# Patient Record
Sex: Male | Born: 1940 | Race: White | Hispanic: No | Marital: Married | Smoking: Former smoker
Health system: Southern US, Community
[De-identification: ages and names within clinical notes are randomized; demographics above are authoritative.]

## PROBLEM LIST (undated history)

## (undated) DIAGNOSIS — Z789 Other specified health status: Secondary | ICD-10-CM

## (undated) DIAGNOSIS — E119 Type 2 diabetes mellitus without complications: Secondary | ICD-10-CM

## (undated) DIAGNOSIS — C61 Malignant neoplasm of prostate: Secondary | ICD-10-CM

## (undated) DIAGNOSIS — I639 Cerebral infarction, unspecified: Secondary | ICD-10-CM

## (undated) DIAGNOSIS — I10 Essential (primary) hypertension: Secondary | ICD-10-CM

## (undated) HISTORY — PX: PROSTATE BIOPSY: SHX241

## (undated) HISTORY — PX: VASECTOMY: SHX75

## (undated) HISTORY — DX: Cerebral infarction, unspecified: I63.9

## (undated) HISTORY — DX: Malignant neoplasm of prostate: C61

---
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
## 1978-03-12 DIAGNOSIS — Z789 Other specified health status: Secondary | ICD-10-CM

## 1978-03-12 HISTORY — PX: OTHER SURGICAL HISTORY: SHX169

## 1978-03-12 HISTORY — DX: Other specified health status: Z78.9

## 2005-04-25 ENCOUNTER — Encounter: Payer: Self-pay | Admitting: Family Medicine

## 2006-02-09 ENCOUNTER — Encounter: Payer: Self-pay | Admitting: Family Medicine

## 2006-02-09 LAB — CONVERTED CEMR LAB: PSA: 2.5 ng/mL

## 2006-06-04 ENCOUNTER — Ambulatory Visit: Payer: Self-pay | Admitting: Family Medicine

## 2006-08-23 ENCOUNTER — Ambulatory Visit: Payer: Self-pay | Admitting: Family Medicine

## 2006-08-23 DIAGNOSIS — C61 Malignant neoplasm of prostate: Secondary | ICD-10-CM | POA: Insufficient documentation

## 2006-08-23 DIAGNOSIS — R7989 Other specified abnormal findings of blood chemistry: Secondary | ICD-10-CM | POA: Insufficient documentation

## 2006-08-27 LAB — CONVERTED CEMR LAB: PSA: 2.62 ng/mL (ref 0.10–4.00)

## 2006-10-01 ENCOUNTER — Encounter: Payer: Self-pay | Admitting: Family Medicine

## 2006-10-01 DIAGNOSIS — I456 Pre-excitation syndrome: Secondary | ICD-10-CM | POA: Insufficient documentation

## 2006-10-01 DIAGNOSIS — Z8679 Personal history of other diseases of the circulatory system: Secondary | ICD-10-CM | POA: Insufficient documentation

## 2006-10-22 ENCOUNTER — Ambulatory Visit: Payer: Self-pay | Admitting: Family Medicine

## 2006-10-22 DIAGNOSIS — E1149 Type 2 diabetes mellitus with other diabetic neurological complication: Secondary | ICD-10-CM | POA: Insufficient documentation

## 2007-01-02 ENCOUNTER — Encounter: Payer: Self-pay | Admitting: Family Medicine

## 2007-01-10 ENCOUNTER — Ambulatory Visit: Payer: Self-pay | Admitting: Family Medicine

## 2007-03-13 LAB — HM COLONOSCOPY

## 2007-03-19 ENCOUNTER — Encounter: Payer: Self-pay | Admitting: Family Medicine

## 2007-03-20 ENCOUNTER — Encounter: Payer: Self-pay | Admitting: Family Medicine

## 2007-03-31 ENCOUNTER — Telehealth (INDEPENDENT_AMBULATORY_CARE_PROVIDER_SITE_OTHER): Payer: Self-pay | Admitting: *Deleted

## 2007-05-23 ENCOUNTER — Ambulatory Visit: Payer: Self-pay | Admitting: Gastroenterology

## 2007-06-03 ENCOUNTER — Ambulatory Visit: Payer: Self-pay | Admitting: Gastroenterology

## 2007-06-03 ENCOUNTER — Encounter: Payer: Self-pay | Admitting: Family Medicine

## 2007-06-23 ENCOUNTER — Ambulatory Visit: Payer: Self-pay | Admitting: Family Medicine

## 2007-06-23 DIAGNOSIS — E7800 Pure hypercholesterolemia, unspecified: Secondary | ICD-10-CM

## 2007-06-23 DIAGNOSIS — E1169 Type 2 diabetes mellitus with other specified complication: Secondary | ICD-10-CM | POA: Insufficient documentation

## 2007-06-23 LAB — CONVERTED CEMR LAB
Cholesterol: 172 mg/dL (ref 0–200)
HDL: 40.6 mg/dL (ref >39.0)
Hgb A1c MFr Bld: 5.9 % (ref 4.6–6.0)
VLDL: 12 mg/dL (ref 0–40)

## 2007-06-25 ENCOUNTER — Ambulatory Visit: Payer: Self-pay | Admitting: Family Medicine

## 2007-06-27 LAB — CONVERTED CEMR LAB
Microalb Creat Ratio: 8.2 mg/g (ref 0.0–30.0)
Microalb, Ur: 1.2 mg/dL (ref 0.0–1.9)

## 2007-12-03 ENCOUNTER — Ambulatory Visit: Payer: Self-pay | Admitting: Family Medicine

## 2007-12-09 ENCOUNTER — Ambulatory Visit: Payer: Self-pay | Admitting: Family Medicine

## 2007-12-26 ENCOUNTER — Ambulatory Visit: Payer: Self-pay | Admitting: Family Medicine

## 2008-01-01 ENCOUNTER — Ambulatory Visit: Payer: Self-pay | Admitting: Family Medicine

## 2008-01-01 DIAGNOSIS — M67919 Unspecified disorder of synovium and tendon, unspecified shoulder: Secondary | ICD-10-CM | POA: Insufficient documentation

## 2008-01-01 DIAGNOSIS — L821 Other seborrheic keratosis: Secondary | ICD-10-CM | POA: Insufficient documentation

## 2008-01-01 DIAGNOSIS — M719 Bursopathy, unspecified: Secondary | ICD-10-CM

## 2008-01-01 LAB — CONVERTED CEMR LAB
ALT: 25 units/L (ref 0–53)
Albumin: 3.7 g/dL (ref 3.5–5.2)
Alkaline Phosphatase: 41 units/L (ref 39–117)
BUN: 15 mg/dL (ref 6–23)
Bilirubin, Direct: 0.2 mg/dL (ref 0.0–0.3)
CO2: 31 meq/L (ref 19–32)
Calcium: 9.3 mg/dL (ref 8.4–10.5)
Cholesterol: 173 mg/dL (ref 0–200)
GFR calc Af Amer: 124 mL/min
Glucose, Bld: 126 mg/dL — ABNORMAL HIGH (ref 70–99)
HDL: 38 mg/dL — ABNORMAL LOW (ref >39.0)
Potassium: 4.6 meq/L (ref 3.5–5.1)
Sodium: 142 meq/L (ref 135–145)
Total Protein: 6.7 g/dL (ref 6.0–8.3)
VLDL: 13 mg/dL (ref 0–40)

## 2008-01-09 ENCOUNTER — Telehealth: Payer: Self-pay | Admitting: Family Medicine

## 2008-01-23 ENCOUNTER — Telehealth: Payer: Self-pay | Admitting: Family Medicine

## 2008-02-03 ENCOUNTER — Encounter: Payer: Self-pay | Admitting: Family Medicine

## 2008-02-03 ENCOUNTER — Ambulatory Visit: Admission: RE | Admit: 2008-02-03 | Discharge: 2008-03-09 | Payer: Self-pay | Admitting: Radiation Oncology

## 2008-03-10 ENCOUNTER — Telehealth: Payer: Self-pay | Admitting: Family Medicine

## 2008-03-29 ENCOUNTER — Ambulatory Visit: Payer: Self-pay | Admitting: Family Medicine

## 2008-03-30 LAB — CONVERTED CEMR LAB
AST: 19 units/L (ref 0–37)
Alkaline Phosphatase: 42 units/L (ref 39–117)
Bilirubin, Direct: 0.1 mg/dL (ref 0.0–0.3)
LDL Cholesterol: 75 mg/dL (ref 0–99)
Total CHOL/HDL Ratio: 2.8
Triglycerides: 46 mg/dL (ref 0–149)

## 2008-05-31 ENCOUNTER — Telehealth: Payer: Self-pay | Admitting: Family Medicine

## 2008-07-06 ENCOUNTER — Ambulatory Visit: Payer: Self-pay | Admitting: Family Medicine

## 2008-07-06 LAB — CONVERTED CEMR LAB
Creatinine,U: 147 mg/dL
HDL goal, serum: 40 mg/dL
Hgb A1c MFr Bld: 6.1 % (ref 4.6–6.5)
LDL Goal: 100 mg/dL
Microalb Creat Ratio: 16.3 mg/g (ref 0.0–30.0)
Microalb, Ur: 2.4 mg/dL — ABNORMAL HIGH (ref 0.0–1.9)

## 2008-07-20 DIAGNOSIS — R198 Other specified symptoms and signs involving the digestive system and abdomen: Secondary | ICD-10-CM

## 2008-07-21 ENCOUNTER — Telehealth: Payer: Self-pay | Admitting: Family Medicine

## 2008-07-21 ENCOUNTER — Encounter: Admission: RE | Admit: 2008-07-21 | Discharge: 2008-07-21 | Payer: Self-pay | Admitting: Family Medicine

## 2008-07-22 ENCOUNTER — Telehealth: Payer: Self-pay | Admitting: Family Medicine

## 2008-08-05 ENCOUNTER — Ambulatory Visit: Payer: Self-pay | Admitting: Family Medicine

## 2008-08-05 DIAGNOSIS — M545 Low back pain: Secondary | ICD-10-CM | POA: Insufficient documentation

## 2008-12-14 ENCOUNTER — Ambulatory Visit: Payer: Self-pay | Admitting: Family Medicine

## 2008-12-30 ENCOUNTER — Ambulatory Visit: Payer: Self-pay | Admitting: Family Medicine

## 2008-12-31 ENCOUNTER — Telehealth: Payer: Self-pay | Admitting: Family Medicine

## 2008-12-31 LAB — CONVERTED CEMR LAB
ALT: 22 units/L (ref 0–53)
BUN: 12 mg/dL (ref 6–23)
Bilirubin, Direct: 0.1 mg/dL (ref 0.0–0.3)
Calcium: 9.1 mg/dL (ref 8.4–10.5)
Cholesterol: 154 mg/dL (ref 0–200)
Creatinine, Ser: 0.8 mg/dL (ref 0.4–1.5)
GFR calc non Af Amer: 102.21 mL/min (ref >60)
Hgb A1c MFr Bld: 6.1 % (ref 4.6–6.5)
Total Bilirubin: 0.8 mg/dL (ref 0.3–1.2)
Triglycerides: 47 mg/dL (ref 0.0–149.0)
VLDL: 9.4 mg/dL (ref 0.0–40.0)

## 2009-01-06 ENCOUNTER — Ambulatory Visit: Payer: Self-pay | Admitting: Family Medicine

## 2009-01-06 DIAGNOSIS — L219 Seborrheic dermatitis, unspecified: Secondary | ICD-10-CM

## 2009-01-06 LAB — CONVERTED CEMR LAB: LDL Goal: 70 mg/dL

## 2009-07-04 ENCOUNTER — Ambulatory Visit: Payer: Self-pay | Admitting: Family Medicine

## 2009-07-05 LAB — CONVERTED CEMR LAB
AST: 21 units/L (ref 0–37)
Alkaline Phosphatase: 44 units/L (ref 39–117)
Bilirubin, Direct: 0.1 mg/dL (ref 0.0–0.3)
CO2: 30 meq/L (ref 19–32)
Calcium: 9.2 mg/dL (ref 8.4–10.5)
GFR calc non Af Amer: 89.08 mL/min (ref >60)
HDL: 36.6 mg/dL — ABNORMAL LOW (ref >39.00)
Potassium: 4.3 meq/L (ref 3.5–5.1)
Sodium: 143 meq/L (ref 135–145)
Total CHOL/HDL Ratio: 4
Total Protein: 6.3 g/dL (ref 6.0–8.3)
VLDL: 11.2 mg/dL (ref 0.0–40.0)

## 2009-07-08 ENCOUNTER — Telehealth: Payer: Self-pay | Admitting: Family Medicine

## 2009-07-08 ENCOUNTER — Ambulatory Visit: Payer: Self-pay | Admitting: Family Medicine

## 2009-09-05 ENCOUNTER — Encounter: Payer: Self-pay | Admitting: Family Medicine

## 2009-09-09 ENCOUNTER — Ambulatory Visit: Payer: Self-pay | Admitting: Urology

## 2009-09-14 ENCOUNTER — Ambulatory Visit: Payer: Self-pay | Admitting: Family Medicine

## 2009-09-14 DIAGNOSIS — R809 Proteinuria, unspecified: Secondary | ICD-10-CM

## 2009-09-14 LAB — CONVERTED CEMR LAB
Cholesterol: 151 mg/dL (ref 0–200)
Creatinine,U: 154.7 mg/dL
Microalb, Ur: 2.9 mg/dL — ABNORMAL HIGH (ref 0.0–1.9)
Triglycerides: 61 mg/dL (ref 0.0–149.0)

## 2009-09-19 ENCOUNTER — Encounter: Payer: Self-pay | Admitting: Family Medicine

## 2009-10-06 ENCOUNTER — Encounter: Payer: Self-pay | Admitting: Family Medicine

## 2009-10-14 ENCOUNTER — Encounter: Payer: Self-pay | Admitting: Family Medicine

## 2009-10-18 ENCOUNTER — Ambulatory Visit: Payer: Self-pay | Admitting: Urology

## 2009-10-19 ENCOUNTER — Ambulatory Visit: Payer: Self-pay | Admitting: Urology

## 2009-10-19 ENCOUNTER — Encounter: Payer: Self-pay | Admitting: Family Medicine

## 2009-11-15 ENCOUNTER — Encounter: Payer: Self-pay | Admitting: Family Medicine

## 2009-12-05 ENCOUNTER — Ambulatory Visit: Payer: Self-pay | Admitting: Family Medicine

## 2009-12-06 LAB — CONVERTED CEMR LAB
ALT: 21 units/L (ref 0–53)
BUN: 18 mg/dL (ref 6–23)
CO2: 31 meq/L (ref 19–32)
Chloride: 105 meq/L (ref 96–112)
Cholesterol: 151 mg/dL (ref 0–200)
Creatinine, Ser: 0.8 mg/dL (ref 0.4–1.5)
Glucose, Bld: 109 mg/dL — ABNORMAL HIGH (ref 70–99)
Hgb A1c MFr Bld: 6.2 % (ref 4.6–6.5)
Total Protein: 6.6 g/dL (ref 6.0–8.3)
Triglycerides: 53 mg/dL (ref 0.0–149.0)

## 2009-12-09 ENCOUNTER — Ambulatory Visit: Payer: Self-pay | Admitting: Family Medicine

## 2009-12-09 DIAGNOSIS — M533 Sacrococcygeal disorders, not elsewhere classified: Secondary | ICD-10-CM | POA: Insufficient documentation

## 2009-12-09 DIAGNOSIS — R030 Elevated blood-pressure reading, without diagnosis of hypertension: Secondary | ICD-10-CM

## 2010-01-17 ENCOUNTER — Encounter: Payer: Self-pay | Admitting: Family Medicine

## 2010-02-07 ENCOUNTER — Encounter: Payer: Self-pay | Admitting: Family Medicine

## 2010-04-11 NOTE — Progress Notes (Signed)
  Phone Note Other Incoming   Caller: patient Summary of Call: Pt inoffice..[facility] to [facility]  to discuss appropriateness of NEW VISALOOK MRI PROCEDURE for prostate cancer. ? call radiology ot [facility] Initial call taken by: Kerby Nora MD,  July 08, 2009 12:17 PM  Follow-up for Phone Call        Pt has to be referred to a Urologiist first and they will refer .  Says he  wants a second opinion and wants to be referred to Dr. Achilles Dunk in [place].  I have scheduled an appt, need referral..sign Follow-up by: Daine Gip,  July 08, 2009 1:05 PM

## 2010-04-11 NOTE — Letter (Signed)
Summary: [facility]  [facility]   Imported By: Lanelle Bal 02/11/2010 08:36:30  _____________________________________________________________________  External Attachment:    Type:   Image     Comment:   External Document

## 2010-04-11 NOTE — Letter (Signed)
Summary: [facility]  [facility]   Imported By: Maryln Gottron 10/25/2009 12:18:31  _____________________________________________________________________  External Attachment:    Type:   Image     Comment:   External Document

## 2010-04-11 NOTE — Consult Note (Signed)
Summary: [facility]   Imported By: Lanelle Bal 09/13/2009 11:47:11  _____________________________________________________________________  External Attachment:    Type:   Image     Comment:   External Document

## 2010-04-11 NOTE — Letter (Signed)
Summary: [facility]  [facility]   Imported By: Sherian Rein 11/30/2009 10:59:18  _____________________________________________________________________  External Attachment:    Type:   Image     Comment:   External Document

## 2010-04-11 NOTE — Letter (Signed)
Summary: [facility]  [facility]   Imported By: Maryln Gottron 10/21/2009 14:12:42  _____________________________________________________________________  External Attachment:    Type:   Image     Comment:   External Document

## 2010-04-11 NOTE — Letter (Signed)
Summary: [facility]  [facility]   Imported By: Lester Butte des Morts 09/22/2009 12:03:26  _____________________________________________________________________  External Attachment:    Type:   Image     Comment:   External Document

## 2010-04-11 NOTE — Assessment & Plan Note (Signed)
Summary: CPX  CYD   Vital Signs:  Patient profile:   70 year old male Height:      71.5 inches Weight:      194.0 pounds BMI:     26.78 Temp:     97.6 degrees F oral Pulse rate:   72 / minute Pulse rhythm:   regular BP sitting:   130 / 60  (left arm) Cuff size:   regular  Vitals Entered By: Benny Lennert CMA Duncan Dull) (July 08, 2009 11:28 AM)  History of Present Illness: Chief complaint  Here for chronic medical issue management.  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 Currently looking for second opinion...needs a referral for consult for new "vivalook" MRI procedure for prostate cancer...at [facility]...Marland Kitchenhe wants to talk with someone about whether this is appropriate.   Seb Derm: significant improvement  with ketoconazole cream.   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 Once Daily 2)  Pravastatin Sodium 40 Mg Tabs (Pravastatin Sodium) .Marland Kitchen.. 1  Tab By Mouth Daily 3)  Vitamin D 1000 Unit  Tabs (Cholecalciferol) .... Two Times A Day 4)  Calcium 500 Mg Tabs (Calcium Carbonate) .... Once Daily 5)  Lycopine 10mg  .... Daily 6)  Ketoconazole 2 % Sham (Ketoconazole) .... Shampoo Hair Every 2 Weeks 7)  Ketoconazole 2 % Crea  (Ketoconazole) .... As Needed 8)  Finasteride 5 Mg Tabs (Finasteride) .... Daily 9)  Fish Oil Maximum Strength 1200 Mg Caps (Omega-3 Fatty Acids) .... Take 2 Tablets Daily 10)  Pomegranate Extract 250 Mg Caps (Pomegranate (Punica Granatum)) .... Daily 11)  Zetia 10 Mg Tabs (Ezetimibe) .Marland Kitchen.. 1 Tab By Mouth Daily  Allergies (verified): No Known Drug Allergies  Past History:  Past medical, surgical, family and social histories (including risk factors) reviewed, and no changes noted (except as noted below).  Past Medical History: Reviewed history from 10/01/2006 and no changes required. Osteoarthritis Cerebrovascular accident, hx of  Past Surgical History: Reviewed history from 10/01/2006 and no changes required. 1949     Tonsillectomy 1980      Partial ACL tear  Family History: Reviewed history from 10/01/2006 and no changes required. Father: Alive 81, BPH, DM, pacemaker due to bradycardia Mother: Died 38, cerebral aneurysm Siblings: 2 brothers - ?SVT, tachycardia No MI < 26  Social History: Reviewed history from 10/01/2006 and no changes required. Former Games developer, college Alcohol use-yes, occasionally beer/wine  Drug use-no Regular exercise-yes, [employer] 3-4 x/week, golf Marital Status: Married x 42 years Children: 2, healthy Occupation: Retired, ___________writer Diet:  Fruit, veggies, low carbohydrates -  rice/pasta,   Review of Systems General:  Denies fatigue and fever. CV:  Denies chest pain or discomfort. Resp:  Denies shortness of breath. GI:  Denies abdominal pain, bloody stools, constipation, and diarrhea. GU:  Denies dysuria.  Physical Exam  General:  Well-developed,well-nourished,in no acute distress; alert,appropriate  and cooperative throughout examination Eyes:  No corneal or conjunctival inflammation noted. EOMI. Perrla. Funduscopic exam benign, without hemorrhages, exudates or papilledema. Vision grossly normal. Ears:  External ear exam shows no significant  lesions or deformities.  Otoscopic examination reveals clear canals, tympanic membranes are intact bilaterally without bulging, retraction, inflammation or discharge. Hearing is grossly normal bilaterally. Nose:  External nasal examination shows no deformity or inflammation. Nasal mucosa are pink and moist without lesions or exudates. Mouth:  MMM Neck:  no carotid bruit or thyromegaly no cervical or supraclavicular lymphadenopathy  Lungs:  Normal respiratory effort, chest expands symmetrically. Lungs are clear to auscultation, no crackles or wheezes. Heart:  Normal rate and regular rhythm. S1 and S2 normal without gallop, murmur, click, rub or other extra sounds. Abdomen:  Bowel sounds positive,abdomen soft and non-tender without masses, organomegaly or hernias noted. Genitalia:  per uro Prostate:  per uro Pulses:  R and L posterior tibial pulses are full and equal bilaterally  Extremities:  no edema   Diabetes Management Exam:    Foot Exam (with socks and/or shoes not present):       Sensory-Pinprick/Light touch:          Left medial foot (L-4): normal          Left dorsal foot (L-5): normal          Left lateral foot (S-1): normal          Right medial foot (L-4): normal          Right dorsal foot (L-5): normal          Right lateral foot (S-1): normal       Sensory-Monofilament:          Left foot: normal          Right foot: normal       Inspection:          Left foot: normal          Right foot: normal       Nails:          Left foot: normal          Right foot: normal   Impression & Recommendations:  Problem # 1:  HYPERCHOLESTEROLEMIA (ICD-272.0) Inaedquate control given hiostory of CVA..LDL goal<70. Pt is not open toincreasing pravastatin given past SE..will insteasd add zetia. Recheck fasting LIPIDS, AST, ALT  in 3 months Dx 272.0    His updated medication list for this problem includes:    Pravastatin Sodium 40 Mg Tabs (Pravastatin sodium) .Marland Kitchen... 1  tab by mouth daily     Zetia 10 Mg Tabs (Ezetimibe) .Marland Kitchen... 1 tab by mouth daily  Labs Reviewed: SGOT: 21 (07/04/2009)   SGPT: 26 (07/04/2009)  Lipid Goals: Chol Goal: 200 (01/06/2009)   HDL Goal: 40 (01/06/2009)   LDL Goal: 70 (01/06/2009)   TG Goal: 150 (01/06/2009)  Prior 10 Yr Risk Heart Disease: 9 % (01/06/2009)   HDL:36.60 (07/04/2009), 45.60 (12/30/2008)  LDL:93 (07/04/2009), 99 (12/30/2008)  Chol:141 (07/04/2009), 154 (12/30/2008)  Trig:56.0 (07/04/2009), 47.0 (12/30/2008)  Problem # 2:  DM (ICD-250.00) Well controlled with lifestyle control.  His updated medication list for this problem includes:    Adult Aspirin Low Strength 81 Mg Tbdp (Aspirin) ..... Once daily  Labs Reviewed: Creat: 0.9 (07/04/2009)     Last Eye Exam: normal (05/10/2008) Reviewed HgBA1c results: 6.0 (07/04/2009)  6.1 (12/30/2008)  Problem # 3:  ADENOCARCINOMA, PROSTATE (ICD-185) Refer to [facility] for vivalook consultation.   Problem #  4:  DERMATITIS, SEBORRHEIC (ICD-690.10) IMproveing with ketoconazole cream.   Complete Medication List: 1)  Adult Aspirin Low Strength 81 Mg Tbdp (Aspirin) .... Once daily 2)  Pravastatin Sodium 40 Mg Tabs (Pravastatin sodium) .Marland Kitchen.. 1  tab by mouth daily 3)  Vitamin D 1000 Unit Tabs (Cholecalciferol) .... Two times a day 4)  Calcium 500 Mg Tabs (Calcium carbonate) .... Once daily 5)  Lycopine 10mg   .... Daily 6)  Ketoconazole 2 % Sham (Ketoconazole) .... Shampoo hair every 2 weeks 7)  Ketoconazole 2 % Crea (Ketoconazole) .... As needed 8)  Finasteride 5 Mg Tabs (Finasteride) .... Daily 9)  Fish Oil Maximum Strength 1200 Mg Caps (Omega-3 fatty acids) .... Take 2 tablets daily 10)  Pomegranate Extract 250 Mg Caps (Pomegranate (punica granatum)) .... Daily 11)  Zetia 10 Mg Tabs (Ezetimibe) .Marland Kitchen.. 1 tab by mouth daily  Patient Instructions: 1)  Start zetia along with pravastain.  2)  Recheck fasting LIPIDS, AST, ALT microalbumin in 3 months Dx 272.0, 250.00 3)  Please schedule a follow-up  appointment in 6 months  DM 4)  Prior to appt Fasting lipids, CMET, A1C.  5)  STOP TO SPEAK WITH CYNTHIA ON YOUR WAY OUT. Prescriptions: ZETIA 10 MG TABS (EZETIMIBE) 1 tab by mouth daily  #30 x 11   Entered and Authorized by:   Kerby Nora MD   Signed by:   Kerby Nora MD on 07/08/2009   Method used:   Electronically to        [facility] [address] (retail)       [address], [address]       [place], [place]  [address]       Ph: [phone]       Fax: [phone]   RxID:   [id number] PRAVASTATIN SODIUM 40 MG TABS (PRAVASTATIN SODIUM) 1  tab by mouth daily  #90 x 3   Entered and Authorized by:   Kerby Nora MD   Signed by:   Kerby Nora MD on 07/08/2009   Method used:   Electronically to        [facility] [address] (retail)       [address], [address]       [place], [place]  [address]       Ph: [phone]       Fax: [phone]   RxID:   [id number]   Current Allergies (reviewed today): No known allergies

## 2010-04-11 NOTE — Assessment & Plan Note (Signed)
Summary: ROA FOR 6 MONTH FOLLOW-UP/JRR   Vital Signs:  Patient profile:   70 year old male Height:      71.5 inches Weight:      193.0 pounds BMI:     26.64 Temp:     98.7 degrees F oral Pulse rate:   72 / minute Pulse rhythm:   regular BP sitting:   120 / 84  (left arm) Cuff size:   regular  Vitals Entered By: Benny Lennert CMA Duncan Dull) (December 09, 2009 12:22 PM)  History of Present Illness: Chief complaint 6 month follow up   DM, well controlled with diet  Elevated blood pressures...Marland Kitchenon finasteride...recent checks ar other MDs have been 145-165/70  Denies anxiety at MD. BPs have always been normal here.  High cholesterol not at  goal <70 given past CVA  Recent discovery of higher grade Gleason score Prostate cancer..plans on treatment in [facility] with proton radiation and hormone treatment 6 months of lupron. Starts proton therapy 10/6 to 02/2010  Coccyx sore x several weeks...pain if sitting directly on it. Prostate MD did not think it was due to met to bone.. Nml bone scan. No known injury. HAs been driving a lot.  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 Pain only with squeezing toe. No heat, warmth, mild redness.      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 Once Daily 2)  Pravastatin Sodium 40 Mg Tabs (Pravastatin Sodium) .Marland Kitchen.. 1  Tab By Mouth Daily 3)  Vitamin D 1000 Unit  Tabs (Cholecalciferol) .... Two Times A Day 4)  Calcium 500 Mg Tabs (Calcium Carbonate) .... Once Daily 5)  Ketoconazole 2 % Sham (Ketoconazole) .... Shampoo Hair Every 2 Weeks 6)  Ketoconazole 2 % Crea (Ketoconazole) .... As Needed 7)  Finasteride 5 Mg Tabs (Finasteride) .... Daily 8)  Fish Oil Maximum Strength 1200 Mg Caps (Omega-3 Fatty Acids) .... Take 2 Tablets Daily 9)  Pomegranate Extract 250 Mg Caps (Pomegranate (Punica Granatum)) .... Daily  Allergies (verified): No Known Drug Allergies  Past History:  Past medical, surgical, family and social histories (including risk factors) reviewed, and no changes noted (except as noted below).  Past Medical History: Reviewed history from 10/01/2006 and no changes required. Osteoarthritis Cerebrovascular accident, hx of  Past Surgical History: Reviewed history from 10/01/2006 and no changes required. 1949     Tonsillectomy 1980      Partial ACL tear  Family History: Reviewed history from 10/01/2006 and no changes required. Father: Alive 44, BPH, DM, pacemaker due to bradycardia Mother: Died 57, cerebral aneurysm Siblings: 2 brothers - ?SVT, tachycardia No MI < 54  Social History: Reviewed history from 10/01/2006 and no changes required. Former Games developer, college Alcohol use-yes, occasionally beer/wine  Drug use-no Regular exercise-yes, [employer] 3-4 x/week, golf Marital Status: Married x 42 years Children: 2,  healthy Occupation: Retired, ___________writer Diet:  Fruit, veggies, low carbohydrates -  rice/pasta,   Review of Systems General:  Denies fatigue and fever. CV:  Denies chest pain or discomfort. Resp:  Denies shortness of breath. GI:  Denies abdominal pain. GU:  Denies dysuria.  Physical Exam  General:  Well-developed,well-nourished,in no acute distress; alert,appropriate and  cooperative throughout examination Eyes:  No corneal or conjunctival inflammation noted. EOMI. Perrla. Funduscopic exam benign, without hemorrhages, exudates or papilledema. Vision grossly normal. Ears:  External ear exam shows no significant lesions or deformities.  Otoscopic examination reveals clear canals, tympanic membranes are intact bilaterally without bulging, retraction, inflammation or discharge. Hearing is grossly normal bilaterally. Nose:  External nasal examination shows no deformity or inflammation. Nasal mucosa are pink and moist without lesions or exudates. Mouth:  MMM Neck:  no carotid bruit or thyromegaly no cervical or supraclavicular lymphadenopathy  Lungs:  Normal respiratory effort, chest expands symmetrically. Lungs are clear to auscultation, no crackles or wheezes. Heart:  Normal rate and regular rhythm. S1 and S2 normal without gallop, murmur, click, rub or other extra sounds. Abdomen:  Bowel sounds positive,abdomen soft and non-tender without masses, organomegaly or hernias noted. Prostate:  refused...also refused exam of coccyx Pulses:  R and L posterior tibial pulses are full and equal bilaterally  Extremities:  no edema   Diabetes Management Exam:    Foot Exam (with socks and/or shoes not present):       Sensory-Pinprick/Light touch:          Left medial foot (L-4): normal          Left dorsal foot (L-5): normal          Left lateral foot (S-1): normal          Right medial foot (L-4): normal          Right dorsal foot (L-5): normal          Right lateral foot (S-1): normal       Sensory-Monofilament:          Left foot: normal          Right foot: normal       Inspection:          Left foot: abnormal             Comments: left great toenail edge slightly ingrown ...mild erythema no warmth, no fluctulance, no discharge Recommended warm water soaks and direction of nail upwards woith small coton swab and ball          Right foot: normal       Nails:           Left foot: normal          Right foot: normal   Impression & Recommendations:  Problem # 1:  DM (ICD-250.00)  Well controlledwith diet. Encouraged exercise, weight loss, healthy eating habits.  His updated medication list for this problem includes:    Adult Aspirin Low Strength 81 Mg Tbdp (Aspirin) ..... Once daily  Labs Reviewed: Creat: 0.8 (12/05/2009)     Last Eye Exam: normal (05/10/2008) Reviewed HgBA1c results: 6.2 (12/05/2009)  6.0 (07/04/2009)  Problem # 2:  ELEVATED BP READING WITHOUT DX HYPERTENSION (ICD-796.2) Follow BPs at home, record.  Drop off measurements in 2 weeks. Low salt diet, continue regular exercsie.  BP today: 120/84 Prior BP: 130/60 (07/08/2009)  Prior 10 Yr Risk Heart Disease: 9 % (01/06/2009)  Labs Reviewed: Creat: 0.8 (12/05/2009) Chol: 151 (12/05/2009)  HDL: 45.20 (12/05/2009)   LDL: 95 (12/05/2009)   TG: 53.0 (12/05/2009)  Instructed in low sodium diet (DASH Handout) and behavior modification.    Problem # 3:  HYPERCHOLESTEROLEMIA (ICD-272.0) On zetia and pravastatin..Patients goal less than 70 given past ? CVA.Marland KitchenJpatient does not currently wish to be more agressive in treating this given concern of SE to medicaitons. He will work on lifestyle changes...recheck in 6 months.  The following medications were removed from the medication list:    Zetia 10 Mg Tabs (Ezetimibe) .Marland Kitchen... 1 tab by mouth daily His updated medication list for this problem includes:    Pravastatin Sodium 40 Mg Tabs (Pravastatin sodium) .Marland Kitchen... 1  tab by mouth daily  Labs Reviewed: SGOT: 18 (12/05/2009)   SGPT: 21 (12/05/2009)  Lipid Goals: Chol Goal: 200 (01/06/2009)   HDL Goal: 40 (01/06/2009)   LDL Goal: 70 (01/06/2009)   TG Goal: 150 (01/06/2009)  Prior 10 Yr Risk Heart Disease: 9 % (01/06/2009)   HDL:45.20 (12/05/2009), 44.60 (09/14/2009)  LDL:95 (12/05/2009), 94 (09/14/2009)  Chol:151 (12/05/2009), 151 (09/14/2009)  Trig:53.0 (12/05/2009), 61.0 (09/14/2009)  Problem  # 4:  ADENOCARCINOMA, PROSTATE (ICD-185) Per [facility] in [facility].  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  No known falls...recommended NSAIDs as needed and donut hole cushion on his long car rides to [facility] that he has been doing lately.  Complete Medication List: 1)  Adult Aspirin Low Strength 81 Mg Tbdp (Aspirin) .... Once daily 2)  Pravastatin Sodium 40 Mg Tabs (Pravastatin sodium) .Marland Kitchen.. 1  tab by mouth daily 3)  Vitamin D 1000 Unit Tabs (Cholecalciferol) .... Two times a day 4)  Calcium 500 Mg Tabs (Calcium carbonate) .... Once daily 5)  Ketoconazole 2 % Sham (Ketoconazole) .... Shampoo hair every 2 weeks 6)  Ketoconazole 2 % Crea (Ketoconazole) .... As needed 7)  Finasteride 5 Mg Tabs (Finasteride) .... Daily 8)  Fish Oil Maximum Strength 1200 Mg Caps (Omega-3 fatty acids) .... Take 2 tablets daily 9)  Pomegranate Extract 250 Mg Caps (Pomegranate (punica granatum)) .... Daily  Patient Instructions: 1)  Follow BPs at home, record. 2)   Drop off measurements in 2 weeks. 3)  Low salt diet, continue regular exercsie.  4)  Please schedule a follow-up appointment in 6 months  CPX. 5)  BMP prior to visit, ICD-9: 250.00 6)  Hepatic Panel prior to visit ICD-9:  7)  Lipid panel prior to visit ICD-9 :  8)  HgBA1c prior to visit  ICD-9:  9)  Urine Microalbumin prior to visit ICD-9 :  10)  Consider welchol to treat cholesterol.Call if interested.   Current Allergies (reviewed today): No known allergies   Last Flu Vaccine:  Fluvax 3+ (12/14/2008 9:42:39 AM) Flu Vaccine Result Date:  12/09/2009 Flu Vaccine Result:  at pharm  Flu Vaccine Next Due:  1 yr

## 2010-04-19 ENCOUNTER — Encounter: Payer: Self-pay | Admitting: Family Medicine

## 2010-04-19 LAB — HM SIGMOIDOSCOPY

## 2010-04-24 ENCOUNTER — Encounter: Payer: Self-pay | Admitting: Family Medicine

## 2010-04-24 ENCOUNTER — Other Ambulatory Visit: Payer: Self-pay | Admitting: Family Medicine

## 2010-04-24 ENCOUNTER — Ambulatory Visit (INDEPENDENT_AMBULATORY_CARE_PROVIDER_SITE_OTHER)
Admission: RE | Admit: 2010-04-24 | Discharge: 2010-04-24 | Disposition: A | Discharge status: Home / Self Care | Payer: Private Health Insurance - Indemnity | Source: Ambulatory Visit | Attending: Family Medicine | Admitting: Family Medicine

## 2010-04-24 ENCOUNTER — Ambulatory Visit (INDEPENDENT_AMBULATORY_CARE_PROVIDER_SITE_OTHER): Payer: Medicare Other | Admitting: Family Medicine

## 2010-04-24 DIAGNOSIS — M533 Sacrococcygeal disorders, not elsewhere classified: Secondary | ICD-10-CM

## 2010-04-26 ENCOUNTER — Telehealth: Payer: Self-pay | Admitting: Family Medicine

## 2010-05-01 ENCOUNTER — Encounter: Payer: Self-pay | Admitting: Family Medicine

## 2010-05-01 ENCOUNTER — Ambulatory Visit: Payer: Self-pay | Admitting: Family Medicine

## 2010-05-03 NOTE — Progress Notes (Signed)
Summary: pt agrees to MRI  Phone Note Call from Patient Call back at Home Phone [phone] P PH     Caller: Patient Call For: Alexander Nora MD Summary of Call: Pt agrees to MRI.  He wants to go to [facility] for this.               Lowella Petties CMA, AAMA  April 26, 2010 9:32 AM

## 2010-05-03 NOTE — Letter (Signed)
Summary: [facility]   Imported By: Kassie Mends 04/28/2010 09:58:50  _____________________________________________________________________  External Attachment:    Type:   Image     Comment:   External Document

## 2010-05-03 NOTE — Assessment & Plan Note (Signed)
Summary: TAILBONE PAIN/CLE    MEDICARE/AETNA   Vital Signs:  Patient profile:   70 year old male Weight:      192.75 pounds Temp:     97.8 degrees F oral Pulse rate:   68 / minute Pulse rhythm:   regular BP sitting:   132 / 70  (left arm) Cuff size:   large  Vitals Entered By: Sydell Axon LPN (April 24, 2010 10:57 AM) CC: Tailbone pain for 6-8 months, worse when sitting in a hard chair Pain Assessment Patient in pain? yes     Location: tailbone Intensity: 8 Type: dull   History of Present Illness: At [facility] last year in 10/2009... Pt noted:  Coccyx sore x several weeks...pain if sitting directly on it. Prostate MD did not think it was due to met to bone.. Nml bone scan. No known injury. Has been driving a lot.  At that OV...recommended donut cushoin on long rides, NSAIDs heat.  Pt refused exam at that time.   Today pt returns with continued pain, ongoing now x 6-8 months... at tip of tailbone. Pain is only present when sitting on hard surface when rols back on tailbone. No pain with walking. No constipation, no fever, no radiation of pain.     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  (ICD-796.2) 3)  Microalbuminuria  (ICD-791.0) 4)  Health Maintenance Exam  (ICD-V70.0) 5)  Dermatitis, Seborrheic  (ICD-690.10) 6)  Low Back Pain, Acute  (ICD-724.2) 7)  Other Symptoms Involving Abdomen and Pelvis  (ICD-789.9) 8)  Bursitis, Right Shoulder  (ICD-726.10) 9)  Seborrheic Keratosis  (ICD-702.19) 10)  Hypercholesterolemia  (ICD-272.0) 11)  Dm  (ICD-250.00) 12)  Wolff (WOLFE)-parkinson-white (WPW) Syndrome  (ICD-426.7) 13)  Cerebrovascular Accident, Hx of  (ICD-V12.50) 14)  Osteoarthritis, 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 Once  Daily 2)  Pravastatin Sodium 40 Mg Tabs (Pravastatin Sodium) .Marland Kitchen.. 1  Tab By Mouth Daily 3)  Vitamin D 1000 Unit  Tabs (Cholecalciferol) .... Two Times A Day 4)  Calcium 500 Mg Tabs (Calcium Carbonate) .... Once Daily 5)  Ketoconazole 2 % Sham (Ketoconazole) .... Shampoo Hair Every 2 Weeks 6)  Ketoconazole 2 % Crea (Ketoconazole) .... As Needed 7)  Fish Oil Maximum Strength 1200 Mg Caps (Omega-3 Fatty Acids) .... Take 2 Tablets Daily 8)  Pomegranate Extract 250 Mg Caps (Pomegranate (Punica Granatum)) .... Daily 9)  Cinnamon 500 Mg Caps (Cinnamon) .... Takes 1000 Mg Daily  Allergies (verified): No Known Drug Allergies  Past History:  Past medical, surgical, family and social histories (including risk factors) reviewed, and no changes noted (except as noted below).  Past Medical History: Reviewed history from 10/01/2006 and no changes required. Osteoarthritis Cerebrovascular accident, hx of  Past Surgical History: Reviewed history from 10/01/2006 and no changes required. 1949     Tonsillectomy 1980      Partial ACL tear  Family History: Reviewed history from 10/01/2006 and no changes required. Father: Alive 66, BPH, DM, pacemaker due to bradycardia Mother: Died 64, cerebral aneurysm Siblings: 2 brothers - ?SVT, tachycardia No MI < 33  Social History: Reviewed history from 10/01/2006 and no changes required. Former Smoker, college Alcohol use-yes, occasionally beer/wine  Drug use-no Regular exercise-yes, [employer] 3-4 x/week, golf Marital Status: Married x 42  years Children: 2, healthy Occupation: Retired, ___________writer Diet:  Fruit, veggies, low carbohydrates -  rice/pasta,   Review of Systems General:  Denies fatigue and fever. CV:  Denies chest pain or discomfort. Resp:  Denies shortness of breath.  Physical Exam  General:  Well-developed,well-nourished,in no acute distress; alert,appropriate and cooperative throughout examination Mouth:  MMM Neck:  no carotid  bruit or thyromegaly no cervical or supraclavicular lymphadenopathy  Lungs:  Normal respiratory effort, chest expands symmetrically. Lungs are clear to auscultation, no crackles or wheezes. Heart:  Normal rate and regular rhythm. S1 and S2 normal without gallop, murmur, click, rub or other extra sounds. Msk:  no spine ttp... except with verticle pressure directly on cocyxx Full ROM of Spine   Impression & Recommendations:  Problem # 1:  OTHER DISORDER OF COCCYX (ICD-724.79) No know injury... but 5-8 months wortht of pain.  Will start eval with plain film. He did have bone scan done last summer given his prostate cancer that was nml.  He has since had prostate cancer treatment.. with chemo, radiation etc... but this was all after pain already started.   Orders: T-Coccyx/Sacrum 2 Views (72220TC)  Complete Medication List: 1)  Adult Aspirin Low Strength 81 Mg Tbdp (Aspirin) .... Once daily 2)  Pravastatin Sodium 40 Mg Tabs (Pravastatin sodium) .Marland Kitchen.. 1  tab by mouth daily 3)  Vitamin D 1000 Unit Tabs (Cholecalciferol) .... Two times a day 4)  Calcium 500 Mg Tabs (Calcium carbonate) .... Once daily 5)  Ketoconazole 2 % Sham (Ketoconazole) .... Shampoo hair every 2 weeks 6)  Ketoconazole 2 % Crea (Ketoconazole) .... As needed 7)  Fish Oil Maximum Strength 1200 Mg Caps (Omega-3 fatty acids) .... Take 2 tablets daily 8)  Pomegranate Extract 250 Mg Caps (Pomegranate (punica granatum)) .... Daily 9)  Cinnamon 500 Mg Caps (Cinnamon) .... Takes 1000 mg daily  Patient Instructions: 1)  We will call  you with X-ray resutls and with furhter recommendations.    Orders Added: 1)  Est. Patient Level III [04540] 2)  T-Coccyx/Sacrum 2 Views [72220TC]    Current Allergies (reviewed today): No known allergies

## 2010-05-04 ENCOUNTER — Encounter: Payer: Self-pay | Admitting: Family Medicine

## 2010-05-18 NOTE — Letter (Signed)
Summary: [facility]   Imported By: Kassie Mends 05/10/2010 09:17:50  _____________________________________________________________________  External Attachment:    Type:   Image     Comment:   External Document

## 2010-06-02 ENCOUNTER — Other Ambulatory Visit: Payer: Self-pay

## 2010-06-09 ENCOUNTER — Encounter: Payer: Self-pay | Admitting: Family Medicine

## 2010-06-23 ENCOUNTER — Other Ambulatory Visit: Payer: Self-pay | Admitting: Family Medicine

## 2010-06-23 DIAGNOSIS — E119 Type 2 diabetes mellitus without complications: Secondary | ICD-10-CM

## 2010-06-27 ENCOUNTER — Other Ambulatory Visit (INDEPENDENT_AMBULATORY_CARE_PROVIDER_SITE_OTHER): Payer: Medicare Other | Admitting: Family Medicine

## 2010-06-27 DIAGNOSIS — E119 Type 2 diabetes mellitus without complications: Secondary | ICD-10-CM

## 2010-06-27 LAB — HEPATIC FUNCTION PANEL
ALT: 23 U/L (ref 0–53)
Total Bilirubin: 0.7 mg/dL (ref 0.3–1.2)
Total Protein: 6.8 g/dL (ref 6.0–8.3)

## 2010-06-27 LAB — BASIC METABOLIC PANEL
BUN: 19 mg/dL (ref 6–23)
Calcium: 9.8 mg/dL (ref 8.4–10.5)
Chloride: 105 mEq/L (ref 96–112)
Creatinine, Ser: 0.7 mg/dL (ref 0.4–1.5)

## 2010-06-27 LAB — LIPID PANEL
Cholesterol: 161 mg/dL (ref 0–200)
LDL Cholesterol: 94 mg/dL (ref 0–99)
VLDL: 12.6 mg/dL (ref 0.0–40.0)

## 2010-06-27 LAB — MICROALBUMIN / CREATININE URINE RATIO
Creatinine,U: 118.7 mg/dL
Microalb, Ur: 4.7 mg/dL — ABNORMAL HIGH (ref 0.0–1.9)

## 2010-07-04 ENCOUNTER — Encounter: Payer: Self-pay | Admitting: Family Medicine

## 2010-07-14 ENCOUNTER — Encounter: Payer: Self-pay | Admitting: Family Medicine

## 2010-07-14 ENCOUNTER — Ambulatory Visit (INDEPENDENT_AMBULATORY_CARE_PROVIDER_SITE_OTHER): Payer: Medicare Other | Admitting: Family Medicine

## 2010-07-14 VITALS — BP 130/72 | HR 80 | Temp 98.4°F | Ht 72.0 in | Wt 193.0 lb

## 2010-07-14 DIAGNOSIS — C61 Malignant neoplasm of prostate: Secondary | ICD-10-CM

## 2010-07-14 DIAGNOSIS — E119 Type 2 diabetes mellitus without complications: Secondary | ICD-10-CM

## 2010-07-14 DIAGNOSIS — R030 Elevated blood-pressure reading, without diagnosis of hypertension: Secondary | ICD-10-CM

## 2010-07-14 DIAGNOSIS — E7800 Pure hypercholesterolemia, unspecified: Secondary | ICD-10-CM

## 2010-07-14 DIAGNOSIS — Z0000 Encounter for general adult medical examination without abnormal findings: Secondary | ICD-10-CM

## 2010-07-14 DIAGNOSIS — M533 Sacrococcygeal disorders, not elsewhere classified: Secondary | ICD-10-CM

## 2010-07-14 DIAGNOSIS — R809 Proteinuria, unspecified: Secondary | ICD-10-CM

## 2010-07-14 NOTE — Progress Notes (Signed)
Subjective:    Patient ID: Alexander Yang, male    DOB: 10/10/1940, 70 y.o.   MRN: [id number]  HPI I have personally reviewed the Medicare Annual Wellness questionnaire and have noted 1. The patient's medical and social history 2. Their use of alcohol, tobacco or illicit drugs 3. Their current medications and supplements 4. The patient's functional ability including ADL's, fall risks, home safety risks and hearing or visual             impairment. 5. Diet and physical activities 6. Evidence for depression or mood disorders The patients weight, height, BMI and visual acuity have been recorded in the chart I have made referrals, counseling and provided education to the patient based review of the above and I have provided the pt with a written personalized care plan for preventive services.  Diabetes:  Diet controlled.. A1C at goal but trending up over last 6 months. Hypoglycemic episodes: None Hyperglycemic episodes:None Feet problems:none Blood Sugars averaging:Not checking at home. eye exam within last year: 06/06/2010  Elevated Cholesterol:  At goal LDL <100 on pravastatin Using medications without problems: Muscle aches: None  Other complaints:None  Coccygeal pain .. aboutthe same. No specific changes on MRI. Followed now by Dr. Margaretha Sheffield.   Prostate Cancer Followed at [facility]. Getting Lupron injections. Tolerating well.. Some decrease in energy and sexual function. PSA 0.5 in 05/11/2010, prostate exam in last year.   PMH and SH reviewed.   Vital signs, Meds and allergies reviewed.   Diabetic foot exam: Normal inspection No skin breakdown No calluses  Normal DP pulses Normal sensation to light tough and monofilament Nails normal     Review of Systems  Constitutional: Negative for fever, fatigue and unexpected weight change.  HENT: Negative for ear pain, congestion, sore throat, rhinorrhea, trouble swallowing and postnasal drip.   Eyes: Negative for pain.    Respiratory: Negative for cough, shortness of breath and wheezing.   Cardiovascular: Negative for chest pain, palpitations and leg swelling.  Gastrointestinal: Negative for nausea, abdominal pain, diarrhea, constipation and blood in stool.  Skin: Negative for rash.  Neurological: Negative for syncope, weakness, light-headedness, numbness and headaches.  Psychiatric/Behavioral: Negative for behavioral problems and dysphoric mood. The patient is not nervous/anxious.          Objective:   Physical Exam  Constitutional: He appears well-developed and well-nourished.  Non-toxic appearance. He does not appear ill. No distress.  HENT:  Head: Normocephalic and atraumatic.  Right Ear: Hearing, tympanic membrane, external ear and ear canal normal.  Left Ear: Hearing, tympanic membrane, external ear and ear canal normal.  Nose: Nose normal.  Mouth/Throat: Uvula is midline, oropharynx is clear and moist and mucous membranes are normal.  Eyes: Conjunctivae, EOM and lids are normal. Pupils are equal, round, and reactive to light. No foreign bodies found.  Neck: Trachea normal, normal range of motion and phonation normal. Neck supple. Carotid bruit is not present. No mass and no thyromegaly present.  Cardiovascular: Normal rate, regular rhythm, S1 normal, S2 normal, intact distal pulses and normal pulses.  Exam reveals no gallop.   No murmur heard. Pulmonary/Chest: Breath sounds normal. He has no wheezes. He has no rhonchi. He has no rales.  Abdominal: Soft. Normal appearance and bowel sounds are normal. There is no hepatosplenomegaly. There is no tenderness. There is no rebound, no guarding and no CVA tenderness. No hernia.  Lymphadenopathy:    He has no cervical adenopathy.  Neurological: He is alert. He has normal strength and normal  reflexes. No cranial nerve deficit or sensory deficit. Gait normal.  Skin: Skin is warm, dry and intact. No rash noted.  Psychiatric: He has a normal mood and affect.  His speech is normal and behavior is normal. Judgment normal.          Assessment & Plan:  Annua; medicare Wellness: The patient's preventative maintenance and recommended screening tests for an annual wellness exam were reviewed in full today. Brought up to date unless services declined.  Counselled on the importance of diet, exercise, and its role in overall health and mortality. The patient's FH and SH was reviewed, including their home life, tobacco status, and drug and alcohol status.

## 2010-07-14 NOTE — Patient Instructions (Signed)
Talk with [place] about microalbuminuria.. I would typically recommend ACE inhibitor in a diabetic for this. Do they think that radiation to prostate and bladder could effect this or cause this? Let me know. We will recheck DM control after Lupron completed. Work on regular exercise and healthy low carb low fat diet.

## 2010-07-14 NOTE — Assessment & Plan Note (Signed)
Well controlled. Continue current medication.  

## 2010-07-14 NOTE — Assessment & Plan Note (Signed)
Well controlled. Would tolerate ACEi as indicated.

## 2010-07-14 NOTE — Assessment & Plan Note (Signed)
Worsened control likely due to lupron. Encouraged exercise, weight loss, healthy eating habits.  Recheck in 6 months when off lupron.

## 2010-07-14 NOTE — Assessment & Plan Note (Signed)
Follow ed by Dr. Margaretha Sheffield. Plan repeat MRI to follow up.

## 2010-07-14 NOTE — Assessment & Plan Note (Signed)
Recommend ACEI for kidney protection. He will discuss with his prostate specialist to see if any of his recent treatments could cause this.

## 2010-07-28 NOTE — Assessment & Plan Note (Signed)
[facility]                           [facility] OFFICE NOTE   [name], Alexander Yang                      MRN:          [id number]  DATE:06/04/2006                            DOB:          03/31/1940    NEW PATIENT HISTORY AND PHYSICAL:   CHIEF COMPLAINT:  A 70 year old white male here to establish new doctor.   HISTORY OF PRESENT ILLNESS:  Alexander Yang moved from [place] to  [place] about 2 years ago.  He previously saw Dr. Nicholes Yang as a  primary care doctor.  His last complete physical was in December 2007.  He has seen him once since then.  He has the following concerns:   1. Recent diagnosis prediabetes:  Blood sugars were checked in      December 2007 and it was elevated at around 120.  Blood sugar was      then again checked by Dr. Nicholes Yang this past month and was      significantly improved, around 110 fasting.  He denies increased      thirst, fatigue, weakness, chest pain, shortness of breath.  He      reports that Dr. Nicholes Yang had plans to recheck the blood sugar in      June 2008.  2. Elevated PSA:  Alexander Yang reports that his PSA last year was      around 1.4 and then this year in December was 2.5.  Given the large      jump over one year, Dr. Nicholes Yang had wanted to recheck on this in      June 2007.  He denies any urinary incontinence, urgency, dysuria,      or difficulty with flow.   REVIEW OF SYSTEMS:  No headache, no dizziness, no syncope.  He is  nearsighted and wears glasses.  No visual change, no hearing change.  No  shortness of breath, no chest pain or palpitations.  No nausea,  vomiting, diarrhea, constipation or rectal bleeding.   PAST MEDICAL HISTORY:  1. Prediabetes.  2. Arthritis in bilateral knees.  3. 1982, history of CVA.  4. Possible WPW syndrome, seen once but then not verified again on      other EKGs.   HOSPITALIZATIONS, SURGERIES AND PROCEDURES:  1. 1949, tonsillectomy.  2. 1980, partial ACL tear.  3.  Flexible sigmoidoscopy, unsure time.   ALLERGIES:  No known drug allergies.   MEDICATIONS:  1. Multivitamin daily.  2. Aspirin 81 mg daily.   SOCIAL HISTORY:  Former smoker in college but remotely.  Occasional  alcohol use.  No drug use.  He is a retired Chemical engineer.  He has been  married for 42 years.  He has two children who are healthy.  He goes to  the [facility] 3-4 times per week and plays golf.  He eats fruits, vegetables,  and is avoiding carbohydrates such as rice and pasta.   FAMILY HISTORY:  Father alive at age 29 with BPH, diabetes and a  pacemaker secondary to bradycardia.  Mother deceased at age 50 with  cerebral aneurysm and no family history of heart  attack before age 85.  He has two brothers, one who has what sounds like SVT or some type of  tachycardia.  No family history of diabetes or any type of cancer.   PHYSICAL EXAMINATION:  VITAL SIGNS:  Height 71-1/2 inches.  Weight  196.2.  Blood pressure 146/80, pulse 64, temperature 98.3.  GENERAL:  A healthy-appearing, elderly male in no apparent distress.  HEENT:  PERRLA.  Extraocular muscles intact.  Oropharynx clear.  Tympanic membranes clear.  Nares clear.  NECK:  No thyromegaly.  No lymphadenopathy.  CARDIOVASCULAR:  Regular rate and rhythm.  No murmurs, rubs or gallops.  Normal PMI.  Peripheral pulses 2+.  No peripheral edema.  LUNGS:  Clear to auscultation bilaterally.  No wheezes, rales or  rhonchi.  ABDOMEN:  Soft, nontender, normoactive bowel sounds.  No  hepatosplenomegaly.  MUSCULOSKELETAL:  Strength 5/5 in upper and lower extremities.  NEUROLOGIC:  Cranial nerves II-XII grossly intact.  Reflexes 2+.  Sensation intact in upper and lower extremities.   ASSESSMENT AND PLAN:  1. Prediabetes, chronic:  Discussed healthy eating habits and      continuing regular exercise.  We will recheck a fasting blood sugar      as his previous doctor had intended in June 2007.  2. PSA trending up:  Will recheck PSA again in  June 2007, which is 6      months after the last check, to determine if this does continue to      trend upwards.  If it does, we can always consider referral to a      urologist.  3. Prevention:  It appears he is most likely up to date with      prevention given that he had a complete physical exam in December.      He did have a flexible sigmoidoscopy in the past but has never had      a colonoscopy and is unsure as to whether he is due for colon      cancer screening.  I will obtain old records.  I will contact him      with any recommendations.     Alexander Nora, MD  Electronically Signed    AB/MedQ  DD: 06/05/2006  DT: 06/05/2006  Job #: [id number]

## 2010-09-12 ENCOUNTER — Other Ambulatory Visit: Payer: Self-pay | Admitting: Family Medicine

## 2011-01-11 ENCOUNTER — Other Ambulatory Visit (INDEPENDENT_AMBULATORY_CARE_PROVIDER_SITE_OTHER): Payer: Medicare Other

## 2011-01-11 DIAGNOSIS — E119 Type 2 diabetes mellitus without complications: Secondary | ICD-10-CM

## 2011-01-16 ENCOUNTER — Ambulatory Visit: Payer: Medicare Other | Admitting: Family Medicine

## 2011-01-18 ENCOUNTER — Ambulatory Visit: Payer: Medicare Other | Admitting: Family Medicine

## 2011-01-25 ENCOUNTER — Ambulatory Visit: Payer: Medicare Other | Admitting: Family Medicine

## 2011-02-08 ENCOUNTER — Ambulatory Visit: Payer: Medicare Other | Admitting: Family Medicine

## 2011-02-15 ENCOUNTER — Encounter: Payer: Self-pay | Admitting: Family Medicine

## 2011-02-15 ENCOUNTER — Ambulatory Visit (INDEPENDENT_AMBULATORY_CARE_PROVIDER_SITE_OTHER): Payer: Medicare Other | Admitting: Family Medicine

## 2011-02-15 DIAGNOSIS — E119 Type 2 diabetes mellitus without complications: Secondary | ICD-10-CM

## 2011-02-15 DIAGNOSIS — R195 Other fecal abnormalities: Secondary | ICD-10-CM | POA: Insufficient documentation

## 2011-02-15 DIAGNOSIS — R030 Elevated blood-pressure reading, without diagnosis of hypertension: Secondary | ICD-10-CM

## 2011-02-15 NOTE — Assessment & Plan Note (Signed)
Well controlled despite weight gain. Continue to follow.

## 2011-02-15 NOTE — Patient Instructions (Addendum)
Trial of probiotic for stool regularity.  Continue healthy lifestyle habits.

## 2011-02-15 NOTE — Assessment & Plan Note (Signed)
Complete eval done in [facility], including colonoscopy. Trial of probiotic.

## 2011-02-15 NOTE — Progress Notes (Signed)
  Subjective:    Patient ID: Alexander Yang, male    DOB: 1940-05-16, 70 y.o.   MRN: [id number]  HPI  Diabetes:  Great control with diet. Lab Results  Component Value Date   HGBA1C 6.1 01/11/2011  Using medications without difficulties: Hypoglycemic episodes: Hyperglycemic episodes: Feet problems: Blood Sugars averaging: eye exam within last year:  BP well controlled on no medication.  Ever since proton therapy for preostate cancer.. He has been having stool urgency. Goes 5-6 times a day. Has discussed with [facility] prostate specialist. Nml colonoscopy, polyp removed..  After bowel prep.. Issue resolved for 3 weeks. In last week issue has returned some. Eating a lot of fiber in diet.  Has gained weight in last year with previous hormone therapy.. Off since 7/ 2012.  Regular exercise, eating healthy.   On agent orange disability...had [facility] eval. Told labs abnormal.. Not sure what it was.  Review of Systems  Constitutional: Negative for fever and fatigue.  HENT: Negative for ear pain.   Eyes: Negative for pain.  Respiratory: Negative for cough and shortness of breath.   Cardiovascular: Negative for chest pain, palpitations and leg swelling.  Gastrointestinal: Negative for nausea, abdominal pain, diarrhea, constipation, blood in stool and abdominal distention.       Stool changes, frequency, urgency.       Objective:   Physical Exam  Constitutional: Vital signs are normal. He appears well-developed and well-nourished.  HENT:  Head: Normocephalic.  Right Ear: Hearing normal.  Left Ear: Hearing normal.  Nose: Nose normal.  Mouth/Throat: Oropharynx is clear and moist and mucous membranes are normal.  Neck: Trachea normal. Carotid bruit is not present. No mass and no thyromegaly present.  Cardiovascular: Normal rate, regular rhythm and normal pulses.  Exam reveals no gallop, no distant heart sounds and no friction rub.   No murmur heard.      No peripheral edema    Pulmonary/Chest: Effort normal and breath sounds normal. No respiratory distress.  Abdominal: Soft. Bowel sounds are normal. There is no tenderness. There is no rebound.  Skin: Skin is warm, dry and intact. No rash noted.  Psychiatric: He has a normal mood and affect. His speech is normal and behavior is normal. Thought content normal.    Diabetic foot exam: Normal inspection No skin breakdown No calluses  Normal DP pulses Normal sensation to light touch and monofilament Nails normal       Assessment & Plan:

## 2011-02-15 NOTE — Assessment & Plan Note (Signed)
Well controlled on diet. Encouraged exercise, weight loss, healthy eating habits.  

## 2011-05-23 DIAGNOSIS — Z8546 Personal history of malignant neoplasm of prostate: Secondary | ICD-10-CM | POA: Diagnosis not present

## 2011-05-23 DIAGNOSIS — C61 Malignant neoplasm of prostate: Secondary | ICD-10-CM | POA: Diagnosis not present

## 2011-07-02 ENCOUNTER — Other Ambulatory Visit: Payer: Self-pay | Admitting: Family Medicine

## 2011-07-09 ENCOUNTER — Telehealth: Payer: Self-pay | Admitting: Family Medicine

## 2011-07-09 DIAGNOSIS — E7800 Pure hypercholesterolemia, unspecified: Secondary | ICD-10-CM

## 2011-07-09 DIAGNOSIS — E119 Type 2 diabetes mellitus without complications: Secondary | ICD-10-CM

## 2011-07-09 NOTE — Telephone Encounter (Signed)
Message copied by Excell Seltzer on Mon Jul 09, 2011 10:59 AM ------      Message from: Alvina Chou      Created: Fri Jul 06, 2011  9:39 AM       5 month f/u labs

## 2011-07-12 ENCOUNTER — Other Ambulatory Visit (INDEPENDENT_AMBULATORY_CARE_PROVIDER_SITE_OTHER): Payer: Medicare Other

## 2011-07-12 DIAGNOSIS — IMO0001 Reserved for inherently not codable concepts without codable children: Secondary | ICD-10-CM

## 2011-07-12 DIAGNOSIS — E119 Type 2 diabetes mellitus without complications: Secondary | ICD-10-CM

## 2011-07-12 DIAGNOSIS — E7800 Pure hypercholesterolemia, unspecified: Secondary | ICD-10-CM

## 2011-07-12 LAB — COMPREHENSIVE METABOLIC PANEL
AST: 19 U/L (ref 0–37)
Albumin: 3.8 g/dL (ref 3.5–5.2)
Alkaline Phosphatase: 44 U/L (ref 39–117)
BUN: 14 mg/dL (ref 6–23)
Potassium: 4.2 mEq/L (ref 3.5–5.1)
Sodium: 140 mEq/L (ref 135–145)
Total Protein: 6.7 g/dL (ref 6.0–8.3)

## 2011-07-12 LAB — LIPID PANEL
LDL Cholesterol: 84 mg/dL (ref 0–99)
Total CHOL/HDL Ratio: 3
Triglycerides: 48 mg/dL (ref 0.0–149.0)

## 2011-07-12 LAB — MICROALBUMIN / CREATININE URINE RATIO: Creatinine,U: 134.4 mg/dL

## 2011-07-12 NOTE — Progress Notes (Signed)
Addended by: Baldomero Lamy on: 07/12/2011 08:33 AM   Modules accepted: Orders

## 2011-07-19 ENCOUNTER — Ambulatory Visit: Payer: Medicare Other | Admitting: Family Medicine

## 2011-07-24 ENCOUNTER — Encounter: Payer: Self-pay | Admitting: Family Medicine

## 2011-07-24 ENCOUNTER — Ambulatory Visit (INDEPENDENT_AMBULATORY_CARE_PROVIDER_SITE_OTHER): Payer: Medicare Other | Admitting: Family Medicine

## 2011-07-24 VITALS — BP 140/90 | HR 59 | Temp 97.6°F | Ht 72.0 in | Wt 201.4 lb

## 2011-07-24 DIAGNOSIS — R030 Elevated blood-pressure reading, without diagnosis of hypertension: Secondary | ICD-10-CM | POA: Diagnosis not present

## 2011-07-24 DIAGNOSIS — M79609 Pain in unspecified limb: Secondary | ICD-10-CM | POA: Diagnosis not present

## 2011-07-24 DIAGNOSIS — Z0000 Encounter for general adult medical examination without abnormal findings: Secondary | ICD-10-CM

## 2011-07-24 DIAGNOSIS — E119 Type 2 diabetes mellitus without complications: Secondary | ICD-10-CM

## 2011-07-24 DIAGNOSIS — E7800 Pure hypercholesterolemia, unspecified: Secondary | ICD-10-CM | POA: Diagnosis not present

## 2011-07-24 DIAGNOSIS — M79672 Pain in left foot: Secondary | ICD-10-CM

## 2011-07-24 NOTE — Assessment & Plan Note (Signed)
Well controlled on pravastatin

## 2011-07-24 NOTE — Assessment & Plan Note (Signed)
Well controlled on diet 

## 2011-07-24 NOTE — Assessment & Plan Note (Signed)
Follow closely at home. Encouraged exercise, weight loss, healthy eating habits.

## 2011-07-24 NOTE — Patient Instructions (Addendum)
Follow BP  at home, goal <130/80, call if consistently high. Work on exercise, and weight loss. Follow up with Dr. Patsy Lager for foot pain with golfing/walking.  Follow up in 6 months with fasting labs prior.

## 2011-07-24 NOTE — Progress Notes (Signed)
Subjective:    Patient ID: Alexander Yang, male    DOB: 03/14/1940, 71 y.o.   MRN: [id number]  HPI I have personally reviewed the Medicare Annual Wellness questionnaire and have noted 1. The patient's medical and social history 2. Their use of alcohol, tobacco or illicit drugs 3. Their current medications and supplements 4. The patient's functional ability including ADL's, fall risks, home safety risks and hearing or visual             impairment. 5. Diet and physical activities 6. Evidence for depression or mood disorders The patients weight, height, BMI and visual acuity have been recorded in the chart I have made referrals, counseling and provided education to the patient based review of the above and I have provided the pt with a written personalized care plan for preventive services.  Has been on trelstar monthly for prostate (done now), has put on weight. Diabetes:  Well controlled on no med. Lab Results  Component Value Date   HGBA1C 6.3 07/12/2011    Using medications without difficulties: Hypoglycemic episodes:? Hyperglycemic episodes:? Feet problems:Yes see HPI Blood Sugars averaging:not checking at home eye exam within last year: 06/2011 BP blood pressure above goal 130/80. Does not check at home  Elevated Cholesterol:  At goal <100 on pravastatin. Using medications without problems:None Muscle aches: None Diet compliance: good Exercise:3-4 times a week Other complaints:    Review of Systems  Constitutional: Negative for fever, fatigue and unexpected weight change.  HENT: Negative for ear pain, congestion, sore throat, rhinorrhea, trouble swallowing and postnasal drip.   Eyes: Negative for pain.  Respiratory: Negative for cough, shortness of breath and wheezing.   Cardiovascular: Negative for chest pain, palpitations and leg swelling.  Gastrointestinal: Positive for abdominal pain. Negative for nausea, diarrhea, constipation and blood in stool.       Occ  abdominal pain/loose stool after change in diet, fried foods  Genitourinary: Negative for dysuria, urgency, hematuria, discharge, penile swelling, scrotal swelling, difficulty urinating, penile pain and testicular pain.  Musculoskeletal:       Left foot pain in heel, always has had some numbness peripherally.  Since gaining weight  Skin: Negative for rash.  Neurological: Negative for syncope, weakness, light-headedness, numbness and headaches.  Psychiatric/Behavioral: Negative for behavioral problems and dysphoric mood. The patient is not nervous/anxious.    Wearing store bought orthotics with no help. No known foot injury. Pain usually after walking or up on toes.     Objective:   Physical Exam  Constitutional: He appears well-developed and well-nourished.  Non-toxic appearance. He does not appear ill. No distress.  HENT:  Head: Normocephalic and atraumatic.  Right Ear: Hearing, tympanic membrane, external ear and ear canal normal.  Left Ear: Hearing, tympanic membrane, external ear and ear canal normal.  Nose: Nose normal.  Mouth/Throat: Uvula is midline, oropharynx is clear and moist and mucous membranes are normal.  Eyes: Conjunctivae, EOM and lids are normal. Pupils are equal, round, and reactive to light. No foreign bodies found.  Neck: Trachea normal, normal range of motion and phonation normal. Neck supple. Carotid bruit is not present. No mass and no thyromegaly present.  Cardiovascular: Normal rate, regular rhythm, S1 normal, S2 normal, intact distal pulses and normal pulses.  Exam reveals no gallop.   No murmur heard. Pulmonary/Chest: Breath sounds normal. He has no wheezes. He has no rhonchi. He has no rales.  Abdominal: Soft. Normal appearance and bowel sounds are normal. There is no hepatosplenomegaly. There is no tenderness.  There is no rebound, no guarding and no CVA tenderness. No hernia.  Genitourinary: Prostate normal. Rectal exam shows no external hemorrhoid, no  internal hemorrhoid, no fissure, no mass, no tenderness and anal tone normal. Guaiac negative stool. Prostate is not enlarged and not tender.  Musculoskeletal:       Right ankle: Normal.       Left ankle: Normal.       Right foot: Normal.       Left foot: Normal.  Lymphadenopathy:    He has no cervical adenopathy.  Neurological: He is alert. He has normal strength and normal reflexes. No cranial nerve deficit or sensory deficit. Gait normal.  Skin: Skin is warm, dry and intact. No rash noted.  Psychiatric: He has a normal mood and affect. His speech is normal and behavior is normal. Judgment normal.    Diabetic foot exam: Normal inspection No skin breakdown No calluses  Normal DP pulses Normal sensation to light touch and monofilament Nails normal       Assessment & Plan:  The patient's preventative maintenance and recommended screening tests for an annual wellness exam were reviewed in full today. Brought up to date unless services declined.  Counselled on the importance of diet, exercise, and its role in overall health and mortality. The patient's FH and SH was reviewed, including their home life, tobacco status, and drug and alcohol status.   Colon Hx of polyps:02/2011 in [place] , one polyp benign path. 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  On lupron PSA last in 05/2011.. At 0.3 stable. Former smoker  About 7 years.

## 2011-07-24 NOTE — Assessment & Plan Note (Signed)
No foot pain currently. Will refer to sports Medicine for further eval, ? Orthotics.

## 2011-07-25 ENCOUNTER — Ambulatory Visit (INDEPENDENT_AMBULATORY_CARE_PROVIDER_SITE_OTHER): Payer: Medicare Other | Admitting: Family Medicine

## 2011-07-25 ENCOUNTER — Encounter: Payer: Self-pay | Admitting: Family Medicine

## 2011-07-25 VITALS — BP 130/88 | HR 55 | Temp 97.5°F | Ht 72.0 in | Wt 202.8 lb

## 2011-07-25 DIAGNOSIS — M722 Plantar fascial fibromatosis: Secondary | ICD-10-CM

## 2011-07-25 DIAGNOSIS — M79672 Pain in left foot: Secondary | ICD-10-CM

## 2011-07-25 DIAGNOSIS — Q667 Congenital pes cavus, unspecified foot: Secondary | ICD-10-CM

## 2011-07-25 DIAGNOSIS — M7662 Achilles tendinitis, left leg: Secondary | ICD-10-CM

## 2011-07-25 DIAGNOSIS — M7660 Achilles tendinitis, unspecified leg: Secondary | ICD-10-CM | POA: Diagnosis not present

## 2011-07-25 DIAGNOSIS — M79609 Pain in unspecified limb: Secondary | ICD-10-CM | POA: Diagnosis not present

## 2011-07-25 NOTE — Progress Notes (Signed)
Patient Name: Alexander Yang Date of Birth: [birth year] Age: 71 y.o. Medical Record Number: [id number] Gender: male Date of Encounter: 07/25/2011  History of Present Illness:  Alexander Yang is a 71 y.o. very pleasant male patient who presents with the following:  Seen at the request of Dr. Ermalene Searing:  Has some diabetic neuropathy, is uncomfortable. Started about two years. Had some chemo - Taxo  All went away excelpt the bottom of his feet. Likes to play golf. Starting around two or three years ago. Walks and does not take the cart. Fine with walking. Fine with playing folf, but the next day, feet will hit the floot and arch will be really sore. He has a natural cavus foot, and now, as he is 71 aged, after playing golf he has some significant pain more on the LEFT medial aspect of the midportion of the plantar fascia. At the time of playing he really doesn't have much pain. He doesn't have any really significant pain in the anterior or posterior insertion of the plantar fascia.  It is more primarily pain just in the longitudinal arch. He has tried some Dr. Margart Sickles inserts without much good success.  L AT insertional pain: this is relatively new, but he does have some pain now at the Achilles insertion on the LEFT without much of a significant nodule.  Patient Active Problem List  Diagnoses  . ADENOCARCINOMA, PROSTATE  . DM  . HYPERCHOLESTEROLEMIA  . WOLFF (WOLFE)-PARKINSON-WHITE (WPW) SYNDROME  . DERMATITIS, SEBORRHEIC  . OTHER DISORDER OF COCCYX  . MICROALBUMINURIA  . ELEVATED BP READING WITHOUT DX HYPERTENSION  . CEREBROVASCULAR ACCIDENT, HX OF  . Foot pain, left   Past Medical History  Diagnosis Date  . Osteoporosis   . Cerebrovascular accident    Past Surgical History  Procedure Date  . Tonsillectomy 03/13/1947  . Acl tear 03/12/1978   History  Substance Use Topics  . Smoking status: Former Games developer  . Smokeless tobacco: Not on file  . Alcohol Use: Yes   Family History   Problem Relation Age of Onset  . Cerebral aneurysm Mother   . Diabetes Father   . Bradycardia Father   . Benign prostatic hyperplasia Father   . Supraventricular tachycardia Brother   . Supraventricular tachycardia Brother    No Known Allergies Current Outpatient Prescriptions on File Prior to Visit  Medication Sig Dispense Refill  . Aspirin (ADULT ASPIRIN LOW STRENGTH) 81 MG EC tablet Take 81 mg by mouth daily.        . cholecalciferol (VITAMIN D) 1000 UNITS tablet Take 1,000 Units by mouth 2 (two) times daily.        Marland Kitchen ketoconazole (NIZORAL) 2 % cream Apply topically as needed.        Marland Kitchen ketoconazole (NIZORAL) 2 % shampoo Shampoo hair every two weeks       . Omega-3 Fatty Acids (FISH OIL MAXIMUM STRENGTH PO) Take by mouth 2 (two) times daily.        . pravastatin (PRAVACHOL) 40 MG tablet TAKE ONE TABLET BY MOUTH EVERY DAY  90 tablet  3     Past Medical History, Surgical History, Social History, Family History, Problem List, Medications, and Allergies have been reviewed and updated if relevant.  Review of Systems:  GEN: No fevers, chills. Nontoxic. Primarily MSK c/o today. MSK: Detailed in the HPI GI: tolerating PO intake without difficulty Neuro: detailed above Otherwise the pertinent positives of the ROS are noted above.    Physical Examination: Filed Vitals:  07/25/11 1215  BP: 130/88  Pulse: 55  Temp: 97.5 F (36.4 C)  TempSrc: Oral  Height: 6' (1.829 m)  Weight: 202 lb 12.8 oz (91.989 kg)  SpO2: 98%    Body mass index is 27.50 kg/(m^2).   GEN: WDWN, NAD, Non-toxic, Alert & Oriented x 3 HEENT: Atraumatic, Normocephalic.  Ears and Nose: No external deformity. EXTR: No clubbing/cyanosis/edema NEURO: Normal gait.  PSYCH: Normally interactive. Conversant. Not depressed or anxious appearing.  Calm demeanor.   FEET: b Echymosis: no Edema: no ROM: full LE B Gait: slight ext rot on L with supination MT pain: no Callus pattern: none Lateral Mall: NT Medial  Mall: NT Talus: NT Navicular: NT Cuboid: NT Calcaneous: NT Metatarsals: NT 5th MT: NT Phalanges: NT Achilles: TTP ON THE R Plantar Fascia: MINIMALLY TENDER ON THE MIDPORTION Fat Pad: NT Peroneals: NT Post Tib: NT Great Toe: Nml motion Ant Drawer: neg ATFL: NT CFL: NT Deltoid: NT Other foot breakdown: none Long arch: preserved - WITH MINIMAL BREAKDOWN AND A CAVUS FOOT Transverse arch: MILD-MOD BREAKDOWN Hindfoot breakdown: none Sensation: intact   Assessment and Plan:  1. Foot pain, left   2. Plantar fascia syndrome   3. Pes cavus   4. Achilles tendinosis, left    Mostly getting irritation the plantar fascia with long walks, and placing some stress with playing golf, and I suspect that is mostly due to his natural cavus foot with more breakdown in his longitudinal arch now compared to when he was younger which then is stressing his plantar fascia. For now, we're going to try a Hapad sports insole with the scaphoid pad additionally to try to support this region. If he does well with that, I think that he would be great with a Plastizote orthotic they would often used for individuals with diabetic neuropathy.  Alfredsson's AT protocol reviewed with him  Orders Today: No orders of the defined types were placed in this encounter.    Medications Today: No orders of the defined types were placed in this encounter.

## 2011-07-25 NOTE — Patient Instructions (Signed)
Achilles Tendonitis Rehab  Begin with easy walking, heel, toe and backwards  Calf raises on a step First lower and then raise on 1 foot If this is painful lower on 1 foot but do the heel raise on both feet  Begin with 3 sets of 10 repetitions  Increase by 5 repetitions every 3 days  Goal is 3 sets of 30 repetitions  Do with both knee straight and knee at 20 degrees of flexion  If pain persists at 3 sets of 30 - add backpack with 5 lbs Increase by 5 lbs per week to max of 30 lbs

## 2011-08-15 DIAGNOSIS — C61 Malignant neoplasm of prostate: Secondary | ICD-10-CM | POA: Diagnosis not present

## 2011-08-15 DIAGNOSIS — Z8546 Personal history of malignant neoplasm of prostate: Secondary | ICD-10-CM | POA: Diagnosis not present

## 2011-11-20 DIAGNOSIS — Z8546 Personal history of malignant neoplasm of prostate: Secondary | ICD-10-CM | POA: Diagnosis not present

## 2011-12-12 DIAGNOSIS — Z23 Encounter for immunization: Secondary | ICD-10-CM | POA: Diagnosis not present

## 2011-12-26 DIAGNOSIS — H2510 Age-related nuclear cataract, unspecified eye: Secondary | ICD-10-CM | POA: Diagnosis not present

## 2011-12-26 DIAGNOSIS — H33319 Horseshoe tear of retina without detachment, unspecified eye: Secondary | ICD-10-CM | POA: Diagnosis not present

## 2011-12-26 DIAGNOSIS — H5310 Unspecified subjective visual disturbances: Secondary | ICD-10-CM | POA: Diagnosis not present

## 2012-01-18 ENCOUNTER — Telehealth: Payer: Self-pay | Admitting: Family Medicine

## 2012-01-18 DIAGNOSIS — R809 Proteinuria, unspecified: Secondary | ICD-10-CM

## 2012-01-18 DIAGNOSIS — E7800 Pure hypercholesterolemia, unspecified: Secondary | ICD-10-CM

## 2012-01-18 DIAGNOSIS — E119 Type 2 diabetes mellitus without complications: Secondary | ICD-10-CM

## 2012-01-18 NOTE — Telephone Encounter (Signed)
Message copied by Excell Seltzer on Fri Jan 18, 2012  2:50 PM ------      Message from: Baldomero Lamy      Created: Fri Jan 11, 2012 10:49 AM      Regarding: 6 mo f/u labs Tues 11/12       Please order  future f/u labs for pt's upcomming lab appt.      Thanks      Rodney Booze

## 2012-01-22 ENCOUNTER — Other Ambulatory Visit (INDEPENDENT_AMBULATORY_CARE_PROVIDER_SITE_OTHER): Payer: Medicare Other

## 2012-01-22 DIAGNOSIS — R809 Proteinuria, unspecified: Secondary | ICD-10-CM

## 2012-01-22 DIAGNOSIS — E119 Type 2 diabetes mellitus without complications: Secondary | ICD-10-CM | POA: Diagnosis not present

## 2012-01-22 DIAGNOSIS — E7800 Pure hypercholesterolemia, unspecified: Secondary | ICD-10-CM

## 2012-01-22 LAB — COMPREHENSIVE METABOLIC PANEL
ALT: 22 U/L (ref 0–53)
AST: 20 U/L (ref 0–37)
Albumin: 3.9 g/dL (ref 3.5–5.2)
BUN: 14 mg/dL (ref 6–23)
CO2: 28 mEq/L (ref 19–32)
Calcium: 9.2 mg/dL (ref 8.4–10.5)
Chloride: 104 mEq/L (ref 96–112)
GFR: 114.39 mL/min (ref >60.00)
Potassium: 4.8 mEq/L (ref 3.5–5.1)

## 2012-01-22 LAB — HEMOGLOBIN A1C: Hgb A1c MFr Bld: 6.4 % (ref 4.6–6.5)

## 2012-01-22 LAB — MICROALBUMIN / CREATININE URINE RATIO
Creatinine,U: 202.7 mg/dL
Microalb Creat Ratio: 1.7 mg/g (ref 0.0–30.0)

## 2012-01-22 LAB — LIPID PANEL
Total CHOL/HDL Ratio: 3
Triglycerides: 66 mg/dL (ref 0.0–149.0)

## 2012-01-22 NOTE — Addendum Note (Signed)
Addended by: Baldomero Lamy on: 01/22/2012 08:46 AM   Modules accepted: Orders

## 2012-01-25 ENCOUNTER — Encounter: Payer: Self-pay | Admitting: Family Medicine

## 2012-01-25 ENCOUNTER — Ambulatory Visit (INDEPENDENT_AMBULATORY_CARE_PROVIDER_SITE_OTHER): Payer: Medicare Other | Admitting: Family Medicine

## 2012-01-25 VITALS — BP 120/72 | HR 60 | Temp 97.7°F | Ht 72.0 in | Wt 200.0 lb

## 2012-01-25 DIAGNOSIS — R030 Elevated blood-pressure reading, without diagnosis of hypertension: Secondary | ICD-10-CM | POA: Diagnosis not present

## 2012-01-25 DIAGNOSIS — N529 Male erectile dysfunction, unspecified: Secondary | ICD-10-CM | POA: Diagnosis not present

## 2012-01-25 DIAGNOSIS — E7800 Pure hypercholesterolemia, unspecified: Secondary | ICD-10-CM | POA: Diagnosis not present

## 2012-01-25 DIAGNOSIS — E119 Type 2 diabetes mellitus without complications: Secondary | ICD-10-CM | POA: Diagnosis not present

## 2012-01-25 MED ORDER — TADALAFIL 20 MG PO TABS: 20.0000 mg | ORAL_TABLET | Freq: Every day | ORAL | Status: DC | PRN

## 2012-01-25 NOTE — Assessment & Plan Note (Signed)
Almost at goal LDL <70 , will work on diet and exercsie cahnges. Does not wish to change med at this time.. Continue current medication.

## 2012-01-25 NOTE — Assessment & Plan Note (Signed)
Well controlled with diet. 

## 2012-01-25 NOTE — Progress Notes (Signed)
  Subjective:    Patient ID: Alexander Yang, male    DOB: 29-Oct-1940, 71 y.o.   MRN: [id number]  HPI 71 year old male presents for 6 months follow up.  Issues in last year with ED. He has history of proton treatment for prostate cancer. Main issue is erection is only 3/4 firm as previously. No desire issues, no depression. No testicular pain. Good energy overall.  Interested in cialis. Testosterone and PSA followed by [facility].  Diabetes: Well controlled on no med.  Lab Results  Component Value Date   HGBA1C 6.4 01/22/2012  Using medications without difficulties:  Hypoglycemic episodes:?  Hyperglycemic episodes:?  Feet problems: improved. Blood Sugars averaging:not checking at home  eye exam within last year: 06/2011   BP blood pressure improved now at goal < 130/80. Does not check at home   Elevated Cholesterol: Almost at goal <70 on pravastatin.  Hx of CVA/TIA Lab Results  Component Value Date   CHOL 145 01/22/2012   HDL 42.80 01/22/2012   LDLCALC 89 01/22/2012   TRIG 66.0 01/22/2012   CHOLHDL 3 01/22/2012  Using medications without problems:None  Muscle aches: None  Diet compliance: good  Exercise:3-4 times a week  Other complaints:       Review of Systems  Constitutional: Negative for fever and fatigue.  HENT: Negative for congestion.   Eyes: Negative for pain.  Respiratory: Negative for shortness of breath and wheezing.   Cardiovascular: Negative for chest pain, palpitations and leg swelling.  Gastrointestinal: Negative for abdominal pain.       Objective:   Physical Exam  Constitutional: Vital signs are normal. He appears well-developed and well-nourished.  HENT:  Head: Normocephalic.  Right Ear: Hearing normal.  Left Ear: Hearing normal.  Nose: Nose normal.  Mouth/Throat: Oropharynx is clear and moist and mucous membranes are normal.  Neck: Trachea normal. Carotid bruit is not present. No mass and no thyromegaly present.  Cardiovascular: Normal rate,  regular rhythm and normal pulses.  Exam reveals no gallop, no distant heart sounds and no friction rub.   No murmur heard.      No peripheral edema  Pulmonary/Chest: Effort normal and breath sounds normal. No respiratory distress.  Skin: Skin is warm, dry and intact. No rash noted.  Psychiatric: He has a normal mood and affect. His speech is normal and behavior is normal. Thought content normal.    Diabetic foot exam: Normal inspection No skin breakdown No calluses  Normal DP pulses Normal sensation to light touch and monofilament Nails normal       Assessment & Plan:

## 2012-01-25 NOTE — Assessment & Plan Note (Signed)
Likely due to age and past prostate cancer treatment. Low testosterone may contribute a little but he cannot replace given prostate cancer history. Trial of cialis.

## 2012-01-25 NOTE — Assessment & Plan Note (Signed)
Resolved

## 2012-02-15 DIAGNOSIS — Z8546 Personal history of malignant neoplasm of prostate: Secondary | ICD-10-CM | POA: Diagnosis not present

## 2012-03-25 ENCOUNTER — Other Ambulatory Visit: Payer: Self-pay

## 2012-03-25 MED ORDER — TADALAFIL 5 MG PO TABS: 20.0000 mg | ORAL_TABLET | Freq: Every day | ORAL | Status: DC | PRN

## 2012-03-25 NOTE — Telephone Encounter (Signed)
Pt left v/m; pt has coupon for 30 day trial of Cialis. Pt request Cialis 5 mg # 30 sent to [facility].Please advise.pt request call back when refill done.

## 2012-03-25 NOTE — Telephone Encounter (Signed)
Sent rx.

## 2012-04-04 NOTE — Telephone Encounter (Signed)
Patient advised.

## 2012-04-24 DIAGNOSIS — R198 Other specified symptoms and signs involving the digestive system and abdomen: Secondary | ICD-10-CM | POA: Diagnosis not present

## 2012-04-24 DIAGNOSIS — R152 Fecal urgency: Secondary | ICD-10-CM | POA: Diagnosis not present

## 2012-04-24 DIAGNOSIS — N508 Other specified disorders of male genital organs: Secondary | ICD-10-CM | POA: Diagnosis not present

## 2012-04-25 DIAGNOSIS — K520 Gastroenteritis and colitis due to radiation: Secondary | ICD-10-CM | POA: Diagnosis not present

## 2012-05-29 DIAGNOSIS — Z8546 Personal history of malignant neoplasm of prostate: Secondary | ICD-10-CM | POA: Diagnosis not present

## 2012-07-17 ENCOUNTER — Telehealth: Payer: Self-pay | Admitting: Family Medicine

## 2012-07-17 DIAGNOSIS — E119 Type 2 diabetes mellitus without complications: Secondary | ICD-10-CM

## 2012-07-17 DIAGNOSIS — C61 Malignant neoplasm of prostate: Secondary | ICD-10-CM

## 2012-07-17 DIAGNOSIS — E7800 Pure hypercholesterolemia, unspecified: Secondary | ICD-10-CM

## 2012-07-17 NOTE — Telephone Encounter (Signed)
Message copied by Excell Seltzer on Thu Jul 17, 2012  2:09 PM ------      Message from: Baldomero Lamy      Created: Thu Jul 03, 2012 10:35 AM      Regarding: Cpx labs 5/9 Fri       Please order  future cpx labs for pt's upcoming lab appt.      Thanks      Tasha       ------

## 2012-07-18 ENCOUNTER — Other Ambulatory Visit: Payer: Self-pay | Admitting: Family Medicine

## 2012-07-18 ENCOUNTER — Telehealth (INDEPENDENT_AMBULATORY_CARE_PROVIDER_SITE_OTHER): Payer: Medicare Other | Admitting: Family Medicine

## 2012-07-18 ENCOUNTER — Other Ambulatory Visit (INDEPENDENT_AMBULATORY_CARE_PROVIDER_SITE_OTHER): Payer: Medicare Other

## 2012-07-18 DIAGNOSIS — E119 Type 2 diabetes mellitus without complications: Secondary | ICD-10-CM | POA: Diagnosis not present

## 2012-07-18 DIAGNOSIS — R5381 Other malaise: Secondary | ICD-10-CM

## 2012-07-18 DIAGNOSIS — E7800 Pure hypercholesterolemia, unspecified: Secondary | ICD-10-CM | POA: Diagnosis not present

## 2012-07-18 DIAGNOSIS — R030 Elevated blood-pressure reading, without diagnosis of hypertension: Secondary | ICD-10-CM

## 2012-07-18 DIAGNOSIS — R5383 Other fatigue: Secondary | ICD-10-CM | POA: Diagnosis not present

## 2012-07-18 LAB — LIPID PANEL
Cholesterol: 143 mg/dL (ref 0–200)
LDL Cholesterol: 90 mg/dL (ref 0–99)
Triglycerides: 62 mg/dL (ref 0.0–149.0)

## 2012-07-18 LAB — COMPREHENSIVE METABOLIC PANEL
Albumin: 3.9 g/dL (ref 3.5–5.2)
Alkaline Phosphatase: 40 U/L (ref 39–117)
BUN: 17 mg/dL (ref 6–23)
Calcium: 8.9 mg/dL (ref 8.4–10.5)
Glucose, Bld: 143 mg/dL — ABNORMAL HIGH (ref 70–99)
Potassium: 4.5 mEq/L (ref 3.5–5.1)

## 2012-07-18 NOTE — Telephone Encounter (Signed)
Test will cost 99 dollars per terri per [facility] can you order. Patient okay with this

## 2012-07-18 NOTE — Telephone Encounter (Signed)
Message copied by Excell Seltzer on Fri Jul 18, 2012  8:39 AM ------      Message from: Alvina Chou      Created: Fri Jul 18, 2012  8:33 AM      Regarding: additional lab request       Pt requested Vit D and Testosterone to be added to today's lab work. Fatigue.      Marland Kitchen  ------

## 2012-07-18 NOTE — Addendum Note (Signed)
Addended by: Kerby Nora E on: 07/18/2012 12:36 PM   Modules accepted: Orders

## 2012-07-18 NOTE — Telephone Encounter (Signed)
Vit D is not covered by medicare for the diagnosis of vit D... Does he want me to check anyway? Or does he have a history of vit D def?  CC: [name]

## 2012-07-19 LAB — VITAMIN D 25 HYDROXY (VIT D DEFICIENCY, FRACTURES): Vit D, 25-Hydroxy: 64 ng/mL (ref 30–89)

## 2012-07-25 ENCOUNTER — Encounter: Payer: Medicare Other | Admitting: Family Medicine

## 2012-07-28 ENCOUNTER — Encounter: Payer: Self-pay | Admitting: Family Medicine

## 2012-07-28 ENCOUNTER — Ambulatory Visit (INDEPENDENT_AMBULATORY_CARE_PROVIDER_SITE_OTHER): Payer: Medicare Other | Admitting: Family Medicine

## 2012-07-28 VITALS — BP 120/72 | HR 62 | Temp 97.9°F | Ht 72.0 in | Wt 199.0 lb

## 2012-07-28 DIAGNOSIS — Z0000 Encounter for general adult medical examination without abnormal findings: Secondary | ICD-10-CM

## 2012-07-28 DIAGNOSIS — R7989 Other specified abnormal findings of blood chemistry: Secondary | ICD-10-CM

## 2012-07-28 DIAGNOSIS — E7800 Pure hypercholesterolemia, unspecified: Secondary | ICD-10-CM

## 2012-07-28 DIAGNOSIS — R030 Elevated blood-pressure reading, without diagnosis of hypertension: Secondary | ICD-10-CM

## 2012-07-28 DIAGNOSIS — E119 Type 2 diabetes mellitus without complications: Secondary | ICD-10-CM

## 2012-07-28 DIAGNOSIS — E291 Testicular hypofunction: Secondary | ICD-10-CM

## 2012-07-28 NOTE — Progress Notes (Signed)
HPI  I have personally reviewed the Medicare Annual Wellness questionnaire and have noted  1. The patient's medical and social history  2. Their use of alcohol, tobacco or illicit drugs  3. Their current medications and supplements  4. The patient's functional ability including ADL's, fall risks, home safety risks and hearing or visual  impairment.  5. Diet and physical activities  6. Evidence for depression or mood disorders  The patients weight, height, BMI and visual acuity have been recorded in the chart  I have made referrals, counseling and provided education to the patient based review of the above and I have provided the pt with a written personalized care plan for preventive services.  Has been on trelstar monthly for prostate (done now), has put on weight.   Diabetes: Well controlled on no med.  Lab Results  Component Value Date   HGBA1C 6.5 07/18/2012  Using medications without difficulties:  Hypoglycemic episodes:?  Hyperglycemic episodes:?  Feet problems:Yes see HPI  Blood Sugars averaging:not checking at home  eye exam within last year: 07/2012   BP blood pressure at goal 130/80. Does not check at home  BP Readings from Last 3 Encounters:  07/28/12 120/72  01/25/12 120/72  07/25/11 130/88   Elevated Cholesterol: At goal <100 on pravastatin. Lab Results  Component Value Date   CHOL 143 07/18/2012   HDL 40.50 07/18/2012   LDLCALC 90 07/18/2012   TRIG 62.0 07/18/2012   CHOLHDL 4 07/18/2012  Using medications without problems:None  Muscle aches: None  Diet compliance: good, some issues witrh portion control. Exercise:3-4 times a week  Wt Readings from Last 3 Encounters:  07/28/12 199 lb (90.266 kg)  01/25/12 200 lb (90.719 kg)  07/25/11 202 lb 12.8 oz (91.989 kg)      Low testosterone on labs (has had low testosterone ever since taking hormones for prostate cancer MD) ... He has been experiencing decreased libido and fatigue.  No erection difficulty... He can take  cialis but just doesn't care about sex.   He contacted prostate cancer MD... They wont start testosterone replacement in prostate cancer.  Other complaints:  Review of Systems  Constitutional: Negative for fever, fatigue and unexpected weight change.  Some ringing in ears. HENT: Negative for ear pain, congestion, sore throat, rhinorrhea, trouble swallowing and postnasal drip.  Eyes: Negative for pain.  Respiratory: Negative for cough, shortness of breath and wheezing.  Cardiovascular: Negative for chest pain, palpitations and leg swelling.  Gastrointestinal: No abdominal pain. Negative for nausea, diarrhea, constipation and blood in stool.  Genitourinary: Negative for dysuria, urgency, hematuria, discharge, penile swelling, scrotal swelling, difficulty urinating, penile pain and testicular pain.  Musculoskeletal:  None Skin: Negative for rash.  Neurological: Negative for syncope, weakness, light-headedness, numbness and headaches.  Psychiatric/Behavioral: Negative for behavioral problems and dysphoric mood. The patient is not nervous/anxious.   Objective:   Physical Exam  Constitutional: He appears well-developed and well-nourished. Non-toxic appearance. He does not appear ill. No distress.  HENT:  Head: Normocephalic and atraumatic.  Right Ear: Hearing, tympanic membrane, external ear and ear canal normal.  Left Ear: Hearing, tympanic membrane, external ear and ear canal normal.  Nose: Nose normal.  Mouth/Throat: Uvula is midline, oropharynx is clear and moist and mucous membranes are normal.  Eyes: Conjunctivae, EOM and lids are normal. Pupils are equal, round, and reactive to light. No foreign bodies found.  Neck: Trachea normal, normal range of motion and phonation normal. Neck supple. Carotid bruit is not present. No mass  and no thyromegaly present.  Cardiovascular: Normal rate, regular rhythm, S1 normal, S2 normal, intact distal pulses and normal pulses. Exam reveals no gallop.   No murmur heard.  Pulmonary/Chest: Breath sounds normal. He has no wheezes. He has no rhonchi. He has no rales.  Abdominal: Soft. Normal appearance and bowel sounds are normal. There is no hepatosplenomegaly. There is no tenderness. There is no rebound, no guarding and no CVA tenderness. No hernia.  Genitourinary: Prostate normal. Rectal exam shows no external hemorrhoid, no internal hemorrhoid, no fissure, no mass, no tenderness and anal tone normal. Guaiac negative stool. Prostate is not enlarged and not tender.  Musculoskeletal:  Right ankle: Normal.  Left ankle: Normal.  Right foot: Normal.  Left foot: Normal.  Lymphadenopathy:  He has no cervical adenopathy.  Neurological: He is alert. He has normal strength and normal reflexes. No cranial nerve deficit or sensory deficit. Gait normal.  Skin: Skin is warm, dry and intact. No rash noted.  Psychiatric: He has a normal mood and affect. His speech is normal and behavior is normal. Judgment normal.  Diabetic foot exam:  Normal inspection  No skin breakdown  No calluses  Normal DP pulses  Normal sensation to light touch and monofilament  Nails normal   Assessment & Plan:   The patient's preventative maintenance and recommended screening tests for an annual wellness exam were reviewed in full today.  Brought up to date unless services declined.  Counselled on the importance of diet, exercise, and its role in overall health and mortality.  The patient's FH and SH was reviewed, including their home life, tobacco status, and drug and alcohol status.   Colon: Hx of polyps:02/2011 in [place] , one polyp benign path.  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 Completed trelstar.  PSA last in 05/2012... At 0.2 stable.  Former smoker about 7 years. No fall in last year. Negative depression screen.

## 2012-07-28 NOTE — Assessment & Plan Note (Signed)
Well controlled. Continue current medication.  

## 2012-07-28 NOTE — Assessment & Plan Note (Signed)
Diet controlled.  

## 2012-07-28 NOTE — Patient Instructions (Addendum)
Work on portion size.

## 2012-11-04 ENCOUNTER — Other Ambulatory Visit: Payer: Self-pay | Admitting: Family Medicine

## 2012-11-20 DIAGNOSIS — Z8546 Personal history of malignant neoplasm of prostate: Secondary | ICD-10-CM | POA: Diagnosis not present

## 2012-12-18 DIAGNOSIS — Z23 Encounter for immunization: Secondary | ICD-10-CM | POA: Diagnosis not present

## 2013-01-16 ENCOUNTER — Telehealth: Payer: Self-pay | Admitting: Family Medicine

## 2013-01-16 DIAGNOSIS — R809 Proteinuria, unspecified: Secondary | ICD-10-CM

## 2013-01-16 DIAGNOSIS — C61 Malignant neoplasm of prostate: Secondary | ICD-10-CM

## 2013-01-16 DIAGNOSIS — E119 Type 2 diabetes mellitus without complications: Secondary | ICD-10-CM

## 2013-01-16 DIAGNOSIS — R7989 Other specified abnormal findings of blood chemistry: Secondary | ICD-10-CM

## 2013-01-16 DIAGNOSIS — E7800 Pure hypercholesterolemia, unspecified: Secondary | ICD-10-CM

## 2013-01-16 NOTE — Telephone Encounter (Signed)
Message copied by Excell Seltzer on Fri Jan 16, 2013  9:53 AM ------      Message from: Alvina Chou      Created: Tue Jan 13, 2013 12:24 PM      Regarding: Lab orders for Tuesday, 11.11.14       Lab orders for a 6 month f/u ------

## 2013-01-20 ENCOUNTER — Other Ambulatory Visit (INDEPENDENT_AMBULATORY_CARE_PROVIDER_SITE_OTHER): Payer: Medicare Other

## 2013-01-20 DIAGNOSIS — R809 Proteinuria, unspecified: Secondary | ICD-10-CM

## 2013-01-20 DIAGNOSIS — E119 Type 2 diabetes mellitus without complications: Secondary | ICD-10-CM | POA: Diagnosis not present

## 2013-01-20 DIAGNOSIS — E7800 Pure hypercholesterolemia, unspecified: Secondary | ICD-10-CM | POA: Diagnosis not present

## 2013-01-20 LAB — COMPREHENSIVE METABOLIC PANEL
AST: 24 U/L (ref 0–37)
Albumin: 3.8 g/dL (ref 3.5–5.2)
Alkaline Phosphatase: 37 U/L — ABNORMAL LOW (ref 39–117)
BUN: 15 mg/dL (ref 6–23)
Calcium: 9 mg/dL (ref 8.4–10.5)
Chloride: 104 mEq/L (ref 96–112)
Glucose, Bld: 144 mg/dL — ABNORMAL HIGH (ref 70–99)
Potassium: 4.2 mEq/L (ref 3.5–5.1)
Sodium: 138 mEq/L (ref 135–145)
Total Protein: 6.8 g/dL (ref 6.0–8.3)

## 2013-01-20 LAB — MICROALBUMIN / CREATININE URINE RATIO
Creatinine,U: 154.4 mg/dL
Microalb Creat Ratio: 1.9 mg/g (ref 0.0–30.0)

## 2013-01-20 LAB — LIPID PANEL
Cholesterol: 143 mg/dL (ref 0–200)
HDL: 45 mg/dL (ref >39.00)
LDL Cholesterol: 83 mg/dL (ref 0–99)
Total CHOL/HDL Ratio: 3
Triglycerides: 73 mg/dL (ref 0.0–149.0)
VLDL: 14.6 mg/dL (ref 0.0–40.0)

## 2013-01-20 LAB — HEMOGLOBIN A1C: Hgb A1c MFr Bld: 6.5 % (ref 4.6–6.5)

## 2013-01-27 ENCOUNTER — Ambulatory Visit (INDEPENDENT_AMBULATORY_CARE_PROVIDER_SITE_OTHER): Payer: Medicare Other | Admitting: Family Medicine

## 2013-01-27 ENCOUNTER — Encounter: Payer: Self-pay | Admitting: Family Medicine

## 2013-01-27 VITALS — BP 140/82 | HR 55 | Temp 97.8°F | Ht 72.0 in | Wt 191.5 lb

## 2013-01-27 DIAGNOSIS — I10 Essential (primary) hypertension: Secondary | ICD-10-CM

## 2013-01-27 DIAGNOSIS — R809 Proteinuria, unspecified: Secondary | ICD-10-CM | POA: Diagnosis not present

## 2013-01-27 DIAGNOSIS — I152 Hypertension secondary to endocrine disorders: Secondary | ICD-10-CM | POA: Insufficient documentation

## 2013-01-27 DIAGNOSIS — E7800 Pure hypercholesterolemia, unspecified: Secondary | ICD-10-CM | POA: Diagnosis not present

## 2013-01-27 DIAGNOSIS — E1159 Type 2 diabetes mellitus with other circulatory complications: Secondary | ICD-10-CM | POA: Insufficient documentation

## 2013-01-27 DIAGNOSIS — E119 Type 2 diabetes mellitus without complications: Secondary | ICD-10-CM | POA: Diagnosis not present

## 2013-01-27 LAB — HM DIABETES FOOT EXAM

## 2013-01-27 MED ORDER — LOSARTAN POTASSIUM 25 MG PO TABS: 25.0000 mg | ORAL_TABLET | Freq: Every day | ORAL | Status: DC

## 2013-01-27 MED ORDER — SILDENAFIL CITRATE 100 MG PO TABS: 100.0000 mg | ORAL_TABLET | Freq: Every day | ORAL | Status: DC | PRN

## 2013-01-27 NOTE — Assessment & Plan Note (Signed)
Well controlled. Continue current medication.  

## 2013-01-27 NOTE — Progress Notes (Signed)
Pre-visit discussion using our clinic review tool. No additional management support is needed unless otherwise documented below in the visit note.  

## 2013-01-27 NOTE — Patient Instructions (Addendum)
Start losartan for BP and kidney protection control. Follow BP at home. Call in 1-2 weeks with BP measurements. Return in 7-10 days for kidney blood check. Follow up for medicare wellness in 6 months.

## 2013-01-27 NOTE — Assessment & Plan Note (Signed)
No sign of end organ damage. Start losartan very low dose.

## 2013-01-27 NOTE — Progress Notes (Signed)
  Subjective:    Patient ID: Alexander Yang, male    DOB: 1940/12/17, 72 y.o.   MRN: [id number]  HPI 72 year old male presents for 6 moth follow up.  Diabetes: Well controlled on no med.  Lab Results  Component Value Date   HGBA1C 6.5 01/20/2013   Using medications without difficulties:  Hypoglycemic episodes:?  Hyperglycemic episodes:?  Feet problems:None Blood Sugars averaging:not checking at home  eye exam within last year: 07/2012   New diagnosis of high blood pressure. BP blood pressure  No longer at goal 130/80.  At home Bps 140/82   Not on any BP med. BP Readings from Last 3 Encounters:  01/27/13 140/82  07/28/12 120/72  01/25/12 120/72   Elevated Cholesterol: At goal <100 on pravastatin.  Lab Results  Component Value Date   CHOL 143 01/20/2013   HDL 45.00 01/20/2013   LDLCALC 83 01/20/2013   TRIG 73.0 01/20/2013   CHOLHDL 3 01/20/2013   Using medications without problems:None  Muscle aches: None  Diet compliance: good, some issues witrh portion control.  Exercise:3-4 times a week   Has had 8 lb weight loss in last 6 months. Wt Readings from Last 3 Encounters:  01/27/13 191 lb 8 oz (86.864 kg)  07/28/12 199 lb (90.266 kg)  01/25/12 200 lb (90.719 kg)        Review of Systems  Constitutional: Negative for fever and fatigue.  HENT: Negative for ear pain.   Eyes: Negative for pain.  Respiratory: Negative for cough and shortness of breath.   Cardiovascular: Negative for chest pain.  Gastrointestinal: Negative for abdominal pain.       Chronic diarrhea since prostate procedure  Genitourinary: Negative for dysuria.       Objective:   Physical Exam  Constitutional: Vital signs are normal. He appears well-developed and well-nourished.  HENT:  Head: Normocephalic.  Right Ear: Hearing normal.  Left Ear: Hearing normal.  Nose: Nose normal.  Mouth/Throat: Oropharynx is clear and moist and mucous membranes are normal.  Neck: Trachea normal. Carotid  bruit is not present. No mass and no thyromegaly present.  Cardiovascular: Normal rate, regular rhythm and normal pulses.  Exam reveals no gallop, no distant heart sounds and no friction rub.   No murmur heard. No peripheral edema  Pulmonary/Chest: Effort normal and breath sounds normal. No respiratory distress.  Skin: Skin is warm, dry and intact. No rash noted.  Psychiatric: He has a normal mood and affect. His speech is normal and behavior is normal. Thought content normal.   Diabetic foot exam: Normal inspection No skin breakdown No calluses  Normal DP pulses Normal sensation to light touch and monofilament Nails normal        Assessment & Plan:

## 2013-01-27 NOTE — Assessment & Plan Note (Signed)
Start ARB for BP and kidney protection

## 2013-01-27 NOTE — Assessment & Plan Note (Signed)
Well controlled. Encouraged exercise, weight loss, healthy eating habits.   

## 2013-02-03 ENCOUNTER — Other Ambulatory Visit (INDEPENDENT_AMBULATORY_CARE_PROVIDER_SITE_OTHER): Payer: Medicare Other

## 2013-02-03 ENCOUNTER — Encounter: Payer: Self-pay | Admitting: Family Medicine

## 2013-02-03 DIAGNOSIS — I10 Essential (primary) hypertension: Secondary | ICD-10-CM

## 2013-02-03 LAB — BASIC METABOLIC PANEL
Calcium: 9.1 mg/dL (ref 8.4–10.5)
Chloride: 106 mEq/L (ref 96–112)
GFR: 107.16 mL/min (ref >60.00)
Glucose, Bld: 127 mg/dL — ABNORMAL HIGH (ref 70–99)
Potassium: 4 mEq/L (ref 3.5–5.1)
Sodium: 140 mEq/L (ref 135–145)

## 2013-02-23 DIAGNOSIS — Z923 Personal history of irradiation: Secondary | ICD-10-CM | POA: Diagnosis not present

## 2013-02-23 DIAGNOSIS — K921 Melena: Secondary | ICD-10-CM | POA: Diagnosis not present

## 2013-02-23 DIAGNOSIS — N508 Other specified disorders of male genital organs: Secondary | ICD-10-CM | POA: Diagnosis not present

## 2013-02-23 DIAGNOSIS — Z8546 Personal history of malignant neoplasm of prostate: Secondary | ICD-10-CM | POA: Diagnosis not present

## 2013-05-25 DIAGNOSIS — Z8546 Personal history of malignant neoplasm of prostate: Secondary | ICD-10-CM | POA: Diagnosis not present

## 2013-05-25 DIAGNOSIS — Z923 Personal history of irradiation: Secondary | ICD-10-CM | POA: Diagnosis not present

## 2013-05-30 ENCOUNTER — Other Ambulatory Visit: Payer: Self-pay | Admitting: Family Medicine

## 2013-07-10 ENCOUNTER — Other Ambulatory Visit: Payer: Self-pay | Admitting: *Deleted

## 2013-07-10 MED ORDER — LOSARTAN POTASSIUM 25 MG PO TABS: 25.0000 mg | ORAL_TABLET | Freq: Every day | ORAL | Status: DC

## 2013-07-11 LAB — HM DIABETES EYE EXAM

## 2013-07-30 ENCOUNTER — Telehealth: Payer: Self-pay | Admitting: Family Medicine

## 2013-07-30 DIAGNOSIS — R809 Proteinuria, unspecified: Secondary | ICD-10-CM

## 2013-07-30 DIAGNOSIS — E119 Type 2 diabetes mellitus without complications: Secondary | ICD-10-CM

## 2013-07-30 DIAGNOSIS — E7800 Pure hypercholesterolemia, unspecified: Secondary | ICD-10-CM

## 2013-07-30 DIAGNOSIS — R7989 Other specified abnormal findings of blood chemistry: Secondary | ICD-10-CM

## 2013-07-30 NOTE — Telephone Encounter (Signed)
Message copied by Jinny Sanders on Thu Jul 30, 2013  5:09 PM ------      Message from: Ellamae Sia      Created: Mon Jul 27, 2013 11:28 AM      Regarding: Lab orders for Tuesday, 5.26.15       Patient is scheduled for CPX labs, please order future labs, Thanks , Alexander Yang       ------

## 2013-08-04 ENCOUNTER — Other Ambulatory Visit (INDEPENDENT_AMBULATORY_CARE_PROVIDER_SITE_OTHER): Payer: Medicare Other

## 2013-08-04 ENCOUNTER — Encounter: Payer: Self-pay | Admitting: Family Medicine

## 2013-08-04 DIAGNOSIS — E7800 Pure hypercholesterolemia, unspecified: Secondary | ICD-10-CM | POA: Diagnosis not present

## 2013-08-04 DIAGNOSIS — R809 Proteinuria, unspecified: Secondary | ICD-10-CM

## 2013-08-04 DIAGNOSIS — I10 Essential (primary) hypertension: Secondary | ICD-10-CM | POA: Diagnosis not present

## 2013-08-04 DIAGNOSIS — E119 Type 2 diabetes mellitus without complications: Secondary | ICD-10-CM

## 2013-08-04 LAB — COMPREHENSIVE METABOLIC PANEL
ALT: 20 U/L (ref 0–53)
AST: 20 U/L (ref 0–37)
Albumin: 3.6 g/dL (ref 3.5–5.2)
Alkaline Phosphatase: 35 U/L — ABNORMAL LOW (ref 39–117)
BUN: 16 mg/dL (ref 6–23)
CO2: 29 meq/L (ref 19–32)
CREATININE: 0.8 mg/dL (ref 0.4–1.5)
Calcium: 9 mg/dL (ref 8.4–10.5)
Chloride: 107 mEq/L (ref 96–112)
GFR: 100.86 mL/min (ref >60.00)
Glucose, Bld: 143 mg/dL — ABNORMAL HIGH (ref 70–99)
Potassium: 4.4 mEq/L (ref 3.5–5.1)
Sodium: 142 mEq/L (ref 135–145)
Total Bilirubin: 0.9 mg/dL (ref 0.2–1.2)
Total Protein: 6.6 g/dL (ref 6.0–8.3)

## 2013-08-04 LAB — MICROALBUMIN / CREATININE URINE RATIO
Creatinine,U: 241.1 mg/dL
Microalb Creat Ratio: 2.2 mg/g (ref 0.0–30.0)
Microalb, Ur: 5.4 mg/dL — ABNORMAL HIGH (ref 0.0–1.9)

## 2013-08-04 LAB — LIPID PANEL
CHOL/HDL RATIO: 3
Cholesterol: 143 mg/dL (ref 0–200)
HDL: 43.5 mg/dL (ref >39.00)
LDL Cholesterol: 90 mg/dL (ref 0–99)
TRIGLYCERIDES: 49 mg/dL (ref 0.0–149.0)
VLDL: 9.8 mg/dL (ref 0.0–40.0)

## 2013-08-04 LAB — HEMOGLOBIN A1C: Hgb A1c MFr Bld: 6.9 % — ABNORMAL HIGH (ref 4.6–6.5)

## 2013-08-05 ENCOUNTER — Telehealth: Payer: Self-pay | Admitting: Family Medicine

## 2013-08-05 NOTE — Telephone Encounter (Signed)
Relevant patient education mailed to patient.  

## 2013-08-11 ENCOUNTER — Encounter: Payer: Self-pay | Admitting: Family Medicine

## 2013-08-11 ENCOUNTER — Ambulatory Visit (INDEPENDENT_AMBULATORY_CARE_PROVIDER_SITE_OTHER): Payer: Medicare Other | Admitting: Family Medicine

## 2013-08-11 VITALS — BP 140/80 | HR 58 | Temp 98.2°F | Ht 71.5 in | Wt 197.0 lb

## 2013-08-11 DIAGNOSIS — E7800 Pure hypercholesterolemia, unspecified: Secondary | ICD-10-CM | POA: Diagnosis not present

## 2013-08-11 DIAGNOSIS — Z0000 Encounter for general adult medical examination without abnormal findings: Secondary | ICD-10-CM

## 2013-08-11 DIAGNOSIS — L219 Seborrheic dermatitis, unspecified: Secondary | ICD-10-CM

## 2013-08-11 DIAGNOSIS — I10 Essential (primary) hypertension: Secondary | ICD-10-CM

## 2013-08-11 DIAGNOSIS — Z23 Encounter for immunization: Secondary | ICD-10-CM | POA: Diagnosis not present

## 2013-08-11 DIAGNOSIS — E119 Type 2 diabetes mellitus without complications: Secondary | ICD-10-CM | POA: Diagnosis not present

## 2013-08-11 DIAGNOSIS — R21 Rash and other nonspecific skin eruption: Secondary | ICD-10-CM

## 2013-08-11 DIAGNOSIS — L600 Ingrowing nail: Secondary | ICD-10-CM

## 2013-08-11 LAB — HM DIABETES FOOT EXAM

## 2013-08-11 MED ORDER — LOSARTAN POTASSIUM 50 MG PO TABS: 50.0000 mg | ORAL_TABLET | Freq: Every day | ORAL | Status: DC

## 2013-08-11 MED ORDER — TRIAMCINOLONE ACETONIDE 0.5 % EX CREA: 1.0000 "application " | TOPICAL_CREAM | Freq: Two times a day (BID) | CUTANEOUS | Status: DC

## 2013-08-11 NOTE — Patient Instructions (Addendum)
Likely chigger bites: Treat with topical steroid. Stop at front desk for referral to podiatry. Work on healthy low carb diet. Try to eat whole wheat bread.  Avoid rice, pasta, potatos, rolls, ice cream. Increase losartan to 50 mg daily. Follow BP at home, call if not at goal < 130/80. Can try coQ10 for prossible statin side effects.  Return in 7-10 days for kidney labs.  Schedule 6 month follow up on HTN and DM with labs prior.

## 2013-08-11 NOTE — Assessment & Plan Note (Signed)
Well controlled. Continue current medication.  

## 2013-08-11 NOTE — Progress Notes (Signed)
HPI  I have personally reviewed the Medicare Annual Wellness questionnaire and have noted  1. The patient's medical and social history  2. Their use of alcohol, tobacco or illicit drugs  3. Their current medications and supplements  4. The patient's functional ability including ADL's, fall risks, home safety risks and hearing or visual  impairment.  5. Diet and physical activities  6. Evidence for depression or mood disorders  The patients weight, height, BMI and visual acuity have been recorded in the chart  I have made referrals, counseling and provided education to the patient based review of the above and I have provided the pt with a written personalized care plan for preventive services.   He has been having issue with left great toenail. Red, painful at times off and on.  Toe next to it rubs on it.  He would like to see a podiatrist.  He has noted red spots on legs and ankles, itchy. No blisters.  Nowhere else. Occurred after working outside in [place] patch. Very itchy.  He noted mole on left upper back ..noted yesterday. Not irritated, not itchy, no bleeding.  Diabetes: Well controlled on no med.  Lab Results  Component Value Date   HGBA1C 6.9* 08/04/2013  Using medications without difficulties:  Hypoglycemic episodes:?  Hyperglycemic episodes:?  Feet problems:Yes see HPI  Blood Sugars averaging:not checking at home  eye exam within last year: 07/2012   BP, not at goal 130/80 despite on losartan. Does not check at home  BP Readings from Last 3 Encounters:  08/11/13 140/80  01/27/13 140/82  07/28/12 120/72    Elevated Cholesterol: At goal <100 on pravastatin.  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 He has been experiencing decreased libido and fatigue.  No erection difficulty... He can take cialis but just doesn't care about sex.  He contacted prostate cancer MD... They wont start testosterone replacement in prostate cancer.   Other complaints:  Review of Systems  Constitutional: Negative for fever, fatigue and unexpected weight change. Some ringing in ears.  HENT: Negative for ear pain, congestion, sore throat, rhinorrhea, trouble swallowing and postnasal drip.  Eyes: Negative for pain.  Respiratory: Negative for cough, shortness of breath and wheezing.  Cardiovascular: Negative for chest pain, palpitations and leg swelling.  Gastrointestinal: No abdominal pain. Negative for nausea, diarrhea, constipation and blood in stool.  Genitourinary: Negative for dysuria, urgency, hematuria, discharge, penile swelling, scrotal swelling, difficulty urinating, penile pain and testicular pain.  Musculoskeletal:  None  Skin: Negative for rash.  Neurological: Negative for syncope, weakness, light-headedness, numbness and headaches.  Psychiatric/Behavioral: Negative for behavioral problems and dysphoric mood. The patient is not nervous/anxious.  Objective:   Physical Exam  Constitutional: He appears well-developed and well-nourished. Non-toxic appearance. He does not appear ill. No distress.  HENT:  Head: Normocephalic and atraumatic.  Right Ear: Hearing, tympanic membrane, external ear and ear canal normal.  Left Ear: Hearing, tympanic membrane, external ear and ear canal normal.  Nose: Nose normal.  Mouth/Throat: Uvula is midline, oropharynx is clear and moist and mucous membranes are normal.  Eyes: Conjunctivae, EOM and lids are normal. Pupils are equal, round, and reactive to  light. No foreign bodies found.  Neck: Trachea normal, normal range of motion and phonation normal. Neck supple. Carotid bruit is not present. No mass and no thyromegaly present.  Cardiovascular: Normal rate, regular rhythm, S1 normal, S2 normal, intact distal pulses and normal pulses. Exam reveals no gallop.  No murmur heard.  Pulmonary/Chest: Breath sounds normal. He has no wheezes. He has no rhonchi. He has no rales.  Abdominal: Soft. Normal appearance and bowel sounds are normal. There is no hepatosplenomegaly. There is no tenderness. There is no rebound, no guarding and no CVA tenderness. No hernia.  Genitourinary:not performed Musculoskeletal:  Right ankle: Normal.  Left ankle: Normal.  Right foot: Normal.  Left foot: Normal.  Lymphadenopathy:  He has no cervical adenopathy.  Neurological: He is alert. He has normal strength and normal reflexes. No cranial nerve deficit or sensory deficit. Gait normal.  Skin: Skin is warm, dry and intact. No rash noted.  Psychiatric: He has a normal mood and affect. His speech is normal and behavior is normal. Judgment normal.   Diabetic foot exam:  Normal inspection  No skin breakdown  No calluses  Normal DP pulses  Normal sensation to light touch and monofilament  Nails: pain and slight erythema at left great toenail edge, nail ingrown  Assessment & Plan:   The patient's preventative maintenance and recommended screening tests for an annual wellness exam were reviewed in full today.  Brought up to date unless services declined.  Counselled on the importance of diet, exercise, and its role in overall health and mortality.  The patient's FH and SH was reviewed, including their home life, tobacco status, and drug and alcohol status.   Colon: Hx of polyps:02/2011 in [place] , one polyp benign path.  Repeat in 10 year. 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 Completed trelstar. PSA last in 05/2013 stable per pt Former smoker about 7 years.  No SOB, no daily cough. No fall in last year.  Negative depression screen.

## 2013-08-11 NOTE — Progress Notes (Signed)
Pre visit review using our clinic review tool, if applicable. No additional management support is needed unless otherwise documented below in the visit note. 

## 2013-08-11 NOTE — Assessment & Plan Note (Signed)
Likely due to chigger bites. Treat with topical steroid.

## 2013-08-11 NOTE — Assessment & Plan Note (Signed)
Pt reassured about lesion on back

## 2013-08-11 NOTE — Assessment & Plan Note (Signed)
Refer to podiatry

## 2013-08-11 NOTE — Assessment & Plan Note (Signed)
Worsened control.Marland Kitchen He will get back on track with diet low carb. If above 7 next check will start a medication.

## 2013-08-11 NOTE — Assessment & Plan Note (Signed)
Inadequately controlled. Continue current medication but increase to 50 mg daily, recheck  Cr in 7-10 days.

## 2013-08-12 ENCOUNTER — Telehealth: Payer: Self-pay | Admitting: Family Medicine

## 2013-08-12 NOTE — Telephone Encounter (Signed)
Relevant patient education assigned to patient using Emmi. ° °

## 2013-08-14 ENCOUNTER — Ambulatory Visit (INDEPENDENT_AMBULATORY_CARE_PROVIDER_SITE_OTHER): Payer: Medicare Other | Admitting: Podiatry

## 2013-08-14 ENCOUNTER — Ambulatory Visit (INDEPENDENT_AMBULATORY_CARE_PROVIDER_SITE_OTHER): Payer: Medicare Other

## 2013-08-14 ENCOUNTER — Encounter: Payer: Self-pay | Admitting: Podiatry

## 2013-08-14 VITALS — BP 126/71 | HR 61 | Resp 16 | Ht 71.5 in | Wt 197.0 lb

## 2013-08-14 DIAGNOSIS — M7750 Other enthesopathy of unspecified foot: Secondary | ICD-10-CM

## 2013-08-14 DIAGNOSIS — L600 Ingrowing nail: Secondary | ICD-10-CM

## 2013-08-14 DIAGNOSIS — G589 Mononeuropathy, unspecified: Secondary | ICD-10-CM

## 2013-08-14 DIAGNOSIS — G629 Polyneuropathy, unspecified: Secondary | ICD-10-CM

## 2013-08-14 NOTE — Progress Notes (Signed)
   Subjective:    Patient ID: Alexander Yang, male    DOB: Aug 07, 1940, 73 y.o.   MRN: [id number]  HPI Comments: For two years i had a toe that hurt, the left great toenail lateral corner, also i need arch support, i also have tingling in my feet,  a1c of 6.9 diabetic controlled by diet   Toe Pain       Review of Systems  Endocrine:       Diabetic   All other systems reviewed and are negative.      Objective:   Physical Exam        Assessment & Plan:

## 2013-08-14 NOTE — Progress Notes (Signed)
Subjective:     Patient ID: Alexander Yang, male   DOB: [birth year], 73 y.o.   MRN: [id number]  Toe Pain    patient presents stating that I have had a chronic painful ingrown toenail in my left big toe for 2 years and my arch is get tender and sore when I try to do any activity and I think I need some kind of inserts   Review of Systems  All other systems reviewed and are negative.      Objective:   Physical Exam  Nursing note and vitals reviewed. Constitutional: He is oriented to person, place, and time.  Cardiovascular: Intact distal pulses.   Musculoskeletal: Normal range of motion.  Neurological: He is oriented to person, place, and time.  Skin: Skin is warm.   neurovascular status is found to be intact with range of motion subtalar midtarsal joint mildly restricted and mild equinus condition noted of both feet. 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 Explained risk and patient wants procedure and today I infiltrated the left hallux 60 mg Xylocaine Marcaine mixture remove the lateral border exposed the matrix and applied chemical phenol 3 applications 30 seconds followed by alcohol lavaged and sterile dressing. Given instructions on soaks and also discussed long-term orthotics to control motion in his feet and he is scanned for custom orthotics at this current time.

## 2013-08-14 NOTE — Patient Instructions (Addendum)

## 2013-08-18 ENCOUNTER — Other Ambulatory Visit (INDEPENDENT_AMBULATORY_CARE_PROVIDER_SITE_OTHER): Payer: Medicare Other

## 2013-08-18 DIAGNOSIS — I10 Essential (primary) hypertension: Secondary | ICD-10-CM

## 2013-08-18 DIAGNOSIS — Z923 Personal history of irradiation: Secondary | ICD-10-CM | POA: Diagnosis not present

## 2013-08-18 DIAGNOSIS — Z8546 Personal history of malignant neoplasm of prostate: Secondary | ICD-10-CM | POA: Diagnosis not present

## 2013-08-18 LAB — BASIC METABOLIC PANEL
BUN: 17 mg/dL (ref 6–23)
CALCIUM: 9.2 mg/dL (ref 8.4–10.5)
CO2: 26 mEq/L (ref 19–32)
Chloride: 104 mEq/L (ref 96–112)
Creatinine, Ser: 0.8 mg/dL (ref 0.4–1.5)
GFR: 102.32 mL/min (ref >60.00)
Glucose, Bld: 136 mg/dL — ABNORMAL HIGH (ref 70–99)
Potassium: 4.3 mEq/L (ref 3.5–5.1)
SODIUM: 139 meq/L (ref 135–145)

## 2013-08-26 ENCOUNTER — Encounter: Payer: Self-pay | Admitting: *Deleted

## 2013-08-26 NOTE — Progress Notes (Signed)
SENT PT POST CARD LETTING HIM KNOW ORTHOTICS ARE HERE.

## 2013-09-08 ENCOUNTER — Ambulatory Visit (INDEPENDENT_AMBULATORY_CARE_PROVIDER_SITE_OTHER): Payer: Medicare Other | Admitting: Podiatry

## 2013-09-08 VITALS — BP 124/65 | HR 67 | Resp 16

## 2013-09-08 DIAGNOSIS — M7750 Other enthesopathy of unspecified foot: Secondary | ICD-10-CM

## 2013-09-08 DIAGNOSIS — L600 Ingrowing nail: Secondary | ICD-10-CM | POA: Diagnosis not present

## 2013-09-08 NOTE — Progress Notes (Signed)
Subjective:     Patient ID: Alexander Yang, male   DOB: 27-Mar-1940, 73 y.o.   MRN: [id number]  HPI patient states that his feet are okay and is excited for his orthotics and he wanted to have his ingrown toenail site checked on his left foot   Review of Systems     Objective:   Physical Exam Neurovascular status intact with patient's left foot lateral border healing well with crusted tissue and no proximal edema erythema drainage noted. Patient's feet continue to be moderately tender upon palpation    Assessment:     Well-healing ingrown toenail site left and tendinitis of both feet    Plan:     H&P reviewed with patient and today I went ahead and I discussed continue soaks left and reviewed orthotics and physical therapy stretching exercises for his feet along with shoe gear that will be acceptable. Reappoint her recheck

## 2013-09-08 NOTE — Patient Instructions (Signed)

## 2013-10-29 ENCOUNTER — Other Ambulatory Visit: Payer: Self-pay | Admitting: Family Medicine

## 2013-10-29 MED ORDER — SILDENAFIL CITRATE 100 MG PO TABS: 100.0000 mg | ORAL_TABLET | Freq: Every day | ORAL | Status: DC | PRN

## 2013-12-11 ENCOUNTER — Encounter: Payer: Self-pay | Admitting: Gastroenterology

## 2013-12-11 DIAGNOSIS — Z23 Encounter for immunization: Secondary | ICD-10-CM | POA: Diagnosis not present

## 2013-12-15 ENCOUNTER — Encounter: Payer: Self-pay | Admitting: Family Medicine

## 2013-12-28 ENCOUNTER — Encounter: Payer: Self-pay | Admitting: Family Medicine

## 2013-12-28 MED ORDER — PRAVASTATIN SODIUM 40 MG PO TABS: ORAL_TABLET | ORAL | Status: DC

## 2014-01-19 DIAGNOSIS — H2513 Age-related nuclear cataract, bilateral: Secondary | ICD-10-CM | POA: Diagnosis not present

## 2014-01-19 DIAGNOSIS — H59819 Chorioretinal scars after surgery for detachment, unspecified eye: Secondary | ICD-10-CM | POA: Diagnosis not present

## 2014-01-19 DIAGNOSIS — H3531 Nonexudative age-related macular degeneration: Secondary | ICD-10-CM | POA: Diagnosis not present

## 2014-01-19 DIAGNOSIS — H25813 Combined forms of age-related cataract, bilateral: Secondary | ICD-10-CM | POA: Diagnosis not present

## 2014-02-08 ENCOUNTER — Telehealth: Payer: Self-pay | Admitting: Family Medicine

## 2014-02-08 DIAGNOSIS — E119 Type 2 diabetes mellitus without complications: Secondary | ICD-10-CM

## 2014-02-08 NOTE — Telephone Encounter (Signed)
-----   Message from Ellamae Sia sent at 02/03/2014 10:46 AM EST ----- Regarding: Lab orders for Tuesday, 12.1.15 Lab orders for a f/u appt

## 2014-02-09 ENCOUNTER — Other Ambulatory Visit (INDEPENDENT_AMBULATORY_CARE_PROVIDER_SITE_OTHER): Payer: Medicare Other

## 2014-02-09 DIAGNOSIS — E119 Type 2 diabetes mellitus without complications: Secondary | ICD-10-CM | POA: Diagnosis not present

## 2014-02-09 LAB — HEMOGLOBIN A1C: Hgb A1c MFr Bld: 7.2 % — ABNORMAL HIGH (ref 4.6–6.5)

## 2014-02-10 LAB — COMPREHENSIVE METABOLIC PANEL
ALK PHOS: 42 U/L (ref 39–117)
ALT: 21 U/L (ref 0–53)
AST: 21 U/L (ref 0–37)
Albumin: 3.9 g/dL (ref 3.5–5.2)
BILIRUBIN TOTAL: 0.9 mg/dL (ref 0.2–1.2)
BUN: 18 mg/dL (ref 6–23)
CO2: 21 mEq/L (ref 19–32)
Calcium: 9.1 mg/dL (ref 8.4–10.5)
Chloride: 107 mEq/L (ref 96–112)
Creatinine, Ser: 0.9 mg/dL (ref 0.4–1.5)
GFR: 92.65 mL/min (ref >60.00)
GLUCOSE: 160 mg/dL — AB (ref 70–99)
Potassium: 4.9 mEq/L (ref 3.5–5.1)
Sodium: 140 mEq/L (ref 135–145)
Total Protein: 6.9 g/dL (ref 6.0–8.3)

## 2014-02-10 LAB — LIPID PANEL
CHOL/HDL RATIO: 4
CHOLESTEROL: 160 mg/dL (ref 0–200)
HDL: 36.6 mg/dL — ABNORMAL LOW (ref >39.00)
LDL CALC: 109 mg/dL — AB (ref 0–99)
NonHDL: 123.4
TRIGLYCERIDES: 70 mg/dL (ref 0.0–149.0)
VLDL: 14 mg/dL (ref 0.0–40.0)

## 2014-02-16 ENCOUNTER — Encounter: Payer: Self-pay | Admitting: Family Medicine

## 2014-02-16 ENCOUNTER — Ambulatory Visit (INDEPENDENT_AMBULATORY_CARE_PROVIDER_SITE_OTHER): Payer: Medicare Other | Admitting: Family Medicine

## 2014-02-16 VITALS — BP 130/68 | HR 63 | Temp 98.3°F | Ht 71.5 in | Wt 199.0 lb

## 2014-02-16 DIAGNOSIS — J069 Acute upper respiratory infection, unspecified: Secondary | ICD-10-CM

## 2014-02-16 DIAGNOSIS — N521 Erectile dysfunction due to diseases classified elsewhere: Secondary | ICD-10-CM | POA: Diagnosis not present

## 2014-02-16 DIAGNOSIS — E7800 Pure hypercholesterolemia, unspecified: Secondary | ICD-10-CM

## 2014-02-16 DIAGNOSIS — M7701 Medial epicondylitis, right elbow: Secondary | ICD-10-CM | POA: Diagnosis not present

## 2014-02-16 DIAGNOSIS — E119 Type 2 diabetes mellitus without complications: Secondary | ICD-10-CM | POA: Diagnosis not present

## 2014-02-16 DIAGNOSIS — I10 Essential (primary) hypertension: Secondary | ICD-10-CM | POA: Diagnosis not present

## 2014-02-16 LAB — HM DIABETES FOOT EXAM

## 2014-02-16 MED ORDER — SILDENAFIL CITRATE 20 MG PO TABS: 40.0000 mg | ORAL_TABLET | Freq: Every day | ORAL | Status: DC | PRN

## 2014-02-16 NOTE — Assessment & Plan Note (Signed)
Well controlled. Continue current medication.  

## 2014-02-16 NOTE — Assessment & Plan Note (Signed)
Trea with home pt.

## 2014-02-16 NOTE — Patient Instructions (Addendum)
Work on lower [employer] as well as exercise aggressively .  We will recheck you labs in 6 months for CPX with labs prior.  Symptom care for viral URI.  Start home PT for medial epicondylitis.

## 2014-02-16 NOTE — Progress Notes (Signed)
73 year old male presents for 6 month follow up.  He has been feeling well overall except for head cold in last 4 days. No fever, no SOB, no wheeze. Has congestion. Has used some OTC neosporin  Viagra  1/2 tab of 100 mg works for him but is too expensive.  Wants to try the generic 20 mg that is now out.  He lifts weight, ttp over right medical epicondyle, but sore in bicep and forearm.   Diabetes: Worsened control on no med, gradual decline in last year.Marland Kitchen  He states that in last 6 month he has not been eating well. Eats ice cream at bedtime. He does not make his food, he is going to take control of his diet. Lab Results  Component Value Date   HGBA1C 7.2* 02/09/2014  Using medications without difficulties:  Hypoglycemic episodes:?  Hyperglycemic episodes:?  Feet problems:Yes see HPI  Blood Sugars averaging:not checking at home  eye exam within last year: 07/2012   BP, now at goal 140/80  (guidelines have changed for diabetics)  on losartan 50 mg. Does not check at home  BP Readings from Last 3 Encounters:  02/16/14 130/68  09/08/13 124/65  08/14/13 126/71    Elevated Cholesterol: Almost at goal <100 on pravastatin.  Lab Results  Component Value Date   CHOL 160 02/09/2014   HDL 36.60* 02/09/2014   LDLCALC 109* 02/09/2014   TRIG 70.0 02/09/2014   CHOLHDL 4 02/09/2014  Using medications without problems:None  Muscle aches: None  Diet compliance: moderate, could be better Exercise:3-4 times a week  Wt Readings from Last 3 Encounters:  02/16/14 199 lb (90.266 kg)  08/14/13 197 lb (89.359 kg)  08/11/13 197 lb (89.359 kg)     Other complaints:  Review of Systems  Constitutional: Negative for fever, fatigue and unexpected weight change. Some ringing in ears.  HENT: Negative for ear pain, congestion, sore throat, rhinorrhea, trouble swallowing and postnasal drip.  Eyes: Negative for pain.  Respiratory: Negative for cough, shortness of breath and wheezing.   Cardiovascular: Negative for chest pain, palpitations and leg swelling.  Gastrointestinal: No abdominal pain. Negative for nausea, diarrhea, constipation and blood in stool.  Genitourinary: Negative for dysuria, urgency, hematuria, discharge, penile swelling, scrotal swelling, difficulty urinating, penile pain and testicular pain.  Musculoskeletal:  None  Skin: Negative for rash.  Neurological: Negative for syncope, weakness, light-headedness, numbness and headaches.  Psychiatric/Behavioral: Negative for behavioral problems and dysphoric mood. The patient is not nervous/anxious.  Objective:   Physical Exam  Constitutional: He appears well-developed and well-nourished. Non-toxic appearance. He does not appear ill. No distress.  HENT:  Head: Normocephalic and atraumatic.  Right Ear: Hearing, tympanic membrane, external ear and ear canal normal.  Left Ear: Hearing, tympanic membrane, external ear and ear canal normal.  Nose: Nose normal.  Mouth/Throat: Uvula is midline, oropharynx is clear and moist and mucous membranes are normal.  Eyes: Conjunctivae, EOM and lids are normal. Pupils are equal, round, and reactive to light. No foreign bodies found.  Neck: Trachea normal, normal range of motion and phonation normal. Neck supple. Carotid bruit is not present. No mass and no thyromegaly present.  Cardiovascular: Normal rate, regular rhythm, S1 normal, S2 normal, intact distal pulses and normal pulses. Exam reveals no gallop.  No murmur heard.  Pulmonary/Chest: Breath sounds normal. He has no wheezes. He has no rhonchi. He has no rales.  Abdominal: Soft. Normal appearance and bowel sounds are normal. There is no hepatosplenomegaly. There is no  tenderness. There is no rebound, no guarding and no CVA tenderness. No hernia.  Genitourinary:not performed Musculoskeletal:  Right arm:  ttp over medical epicon. PAin mild with pronation supination, full stretcngh and full ROM at elbow.   Lymphadenopathy:  He has no cervical adenopathy.  Neurological: He is alert. He has normal strength and normal reflexes. No cranial nerve deficit or sensory deficit. Gait normal.  Skin: Skin is warm, dry and intact. No rash noted.  Psychiatric: He has a normal mood and affect. His speech is normal and behavior is normal. Judgment normal.   Diabetic foot exam:  Normal inspection  No skin breakdown  No calluses  Normal DP pulses  Normal sensation to light touch and monofilament  Nails: pain and slight erythema at left great toenail edge, nail ingrown

## 2014-02-16 NOTE — Assessment & Plan Note (Signed)
Will try to send in generic sildenafil 20 mg.

## 2014-02-16 NOTE — Assessment & Plan Note (Signed)
Worsened control, work aggressively on diet. If not < 7 at next OV consider metformin.

## 2014-02-19 DIAGNOSIS — Z923 Personal history of irradiation: Secondary | ICD-10-CM | POA: Diagnosis not present

## 2014-02-19 DIAGNOSIS — Z8546 Personal history of malignant neoplasm of prostate: Secondary | ICD-10-CM | POA: Diagnosis not present

## 2014-04-13 ENCOUNTER — Other Ambulatory Visit: Payer: Self-pay | Admitting: Family Medicine

## 2014-08-02 ENCOUNTER — Encounter: Payer: Self-pay | Admitting: Family Medicine

## 2014-08-02 DIAGNOSIS — R7989 Other specified abnormal findings of blood chemistry: Secondary | ICD-10-CM

## 2014-08-02 MED ORDER — GUAIFENESIN-CODEINE 100-10 MG/5ML PO SYRP: 5.0000 mL | ORAL_SOLUTION | Freq: Every evening | ORAL | Status: DC | PRN

## 2014-08-02 NOTE — Telephone Encounter (Signed)
See pt questions.  Call in below rx.  Can you check on testosterone  out of pocket cost for him? Or forward this to appropriate person?

## 2014-08-02 NOTE — Telephone Encounter (Signed)
Robatussin AC called in to [place].  Will forward to Lab for cost of testosterone level.

## 2014-08-06 NOTE — Addendum Note (Signed)
Addended byEliezer Lofts E on: 08/06/2014 01:27 PM   Modules accepted: Orders

## 2014-08-13 ENCOUNTER — Other Ambulatory Visit (INDEPENDENT_AMBULATORY_CARE_PROVIDER_SITE_OTHER): Payer: Medicare Other

## 2014-08-13 ENCOUNTER — Telehealth: Payer: Self-pay | Admitting: Family Medicine

## 2014-08-13 DIAGNOSIS — E119 Type 2 diabetes mellitus without complications: Secondary | ICD-10-CM

## 2014-08-13 DIAGNOSIS — E291 Testicular hypofunction: Secondary | ICD-10-CM

## 2014-08-13 DIAGNOSIS — R7989 Other specified abnormal findings of blood chemistry: Secondary | ICD-10-CM

## 2014-08-13 DIAGNOSIS — C61 Malignant neoplasm of prostate: Secondary | ICD-10-CM

## 2014-08-13 LAB — COMPREHENSIVE METABOLIC PANEL WITH GFR
ALT: 22 U/L (ref 0–53)
AST: 17 U/L (ref 0–37)
Albumin: 3.9 g/dL (ref 3.5–5.2)
Alkaline Phosphatase: 44 U/L (ref 39–117)
BUN: 18 mg/dL (ref 6–23)
CO2: 30 meq/L (ref 19–32)
Calcium: 9.2 mg/dL (ref 8.4–10.5)
Chloride: 104 meq/L (ref 96–112)
Creatinine, Ser: 0.82 mg/dL (ref 0.40–1.50)
GFR: 97.74 mL/min
Glucose, Bld: 161 mg/dL — ABNORMAL HIGH (ref 70–99)
Potassium: 4.4 meq/L (ref 3.5–5.1)
Sodium: 138 meq/L (ref 135–145)
Total Bilirubin: 0.8 mg/dL (ref 0.2–1.2)
Total Protein: 6.9 g/dL (ref 6.0–8.3)

## 2014-08-13 LAB — LIPID PANEL
CHOLESTEROL: 153 mg/dL (ref 0–200)
HDL: 43.7 mg/dL (ref >39.00)
LDL CALC: 96 mg/dL (ref 0–99)
NonHDL: 109.3
Total CHOL/HDL Ratio: 4
Triglycerides: 67 mg/dL (ref 0.0–149.0)
VLDL: 13.4 mg/dL (ref 0.0–40.0)

## 2014-08-13 LAB — TESTOSTERONE: Testosterone: 191.43 ng/dL — ABNORMAL LOW (ref 300.00–890.00)

## 2014-08-13 NOTE — Addendum Note (Signed)
Addended by: Ellamae Sia on: 08/13/2014 09:43 AM   Modules accepted: Orders

## 2014-08-13 NOTE — Telephone Encounter (Signed)
-----   Message from Ellamae Sia sent at 08/11/2014 11:51 AM EDT ----- Regarding: Lab orders for Friday, 6.3.16 Patient is scheduled for CPX labs, please order future labs, Thanks , Karna Christmas

## 2014-08-20 ENCOUNTER — Encounter: Payer: Medicare Other | Admitting: Family Medicine

## 2014-08-24 ENCOUNTER — Encounter: Payer: Medicare Other | Admitting: Family Medicine

## 2014-08-30 DIAGNOSIS — Z8546 Personal history of malignant neoplasm of prostate: Secondary | ICD-10-CM | POA: Diagnosis not present

## 2014-08-31 ENCOUNTER — Ambulatory Visit (INDEPENDENT_AMBULATORY_CARE_PROVIDER_SITE_OTHER): Payer: Medicare Other | Admitting: Family Medicine

## 2014-08-31 ENCOUNTER — Encounter: Payer: Self-pay | Admitting: Family Medicine

## 2014-08-31 VITALS — BP 118/70 | HR 62 | Temp 97.6°F | Ht 72.0 in | Wt 193.5 lb

## 2014-08-31 DIAGNOSIS — E7800 Pure hypercholesterolemia, unspecified: Secondary | ICD-10-CM

## 2014-08-31 DIAGNOSIS — E119 Type 2 diabetes mellitus without complications: Secondary | ICD-10-CM | POA: Diagnosis not present

## 2014-08-31 DIAGNOSIS — Z7189 Other specified counseling: Secondary | ICD-10-CM

## 2014-08-31 DIAGNOSIS — Z0000 Encounter for general adult medical examination without abnormal findings: Secondary | ICD-10-CM | POA: Diagnosis not present

## 2014-08-31 DIAGNOSIS — I10 Essential (primary) hypertension: Secondary | ICD-10-CM | POA: Diagnosis not present

## 2014-08-31 LAB — HEMOGLOBIN A1C: Hgb A1c MFr Bld: 6.9 % — ABNORMAL HIGH (ref 4.6–6.5)

## 2014-08-31 LAB — HM DIABETES FOOT EXAM

## 2014-08-31 NOTE — Assessment & Plan Note (Signed)
Well controlled. Continue current medication.  

## 2014-08-31 NOTE — Progress Notes (Signed)
Pre visit review using our clinic review tool, if applicable. No additional management support is needed unless otherwise documented below in the visit note. 

## 2014-08-31 NOTE — Assessment & Plan Note (Signed)
Due for re-eval. Pt hesitant about medication, especially metformin. Does not want to change diet further unless CBG higher.  Check A1C if still high he is open to glucotrol XL.

## 2014-08-31 NOTE — Progress Notes (Signed)
HPI  I have personally reviewed the Medicare Annual Wellness questionnaire and have noted 1. The patient's medical and social history 2. Their use of alcohol, tobacco or illicit drugs 3. Their current medications and supplements 4. The patient's functional ability including ADL's, fall risks, home safety risks and hearing or visual             impairment. 5. Diet and physical activities 6. Evidence for depression or mood disorders 7.         Updated provider list Cognitive evaluation was performed and recorded on pt medicare questionnaire form. The patients weight, height, BMI and visual acuity have been recorded in the chart  I have made referrals, counseling and provided education to the patient based review of the above and I have provided the pt with a written personalized care plan for preventive services.   4 days ago  after playing golf all day., went out to eat. Had a hamburger,ate half. Later that night  He felt full and gassy.  Awoke that 2AM.. With abdominal cramping, bloating, reflux. Had to sit up. He made himself throw up, no blood. No pain in chest or SOB. Next AM when he woke up he felt better, has not hand any problem since except slightly more gassy. Small amount of  Diarrhea 2 days prior to above episode, resolve with imodium. Now started a probiotic.  Diabetes: Due for re-eval.Was not checked by accident today.  Lab Results  Component Value Date   HGBA1C 7.2* 02/09/2014  Hypoglycemic episodes:? Hyperglycemic episodes:? Feet problems:None Blood Sugars averaging: not checking eye exam within last year:yes Exercising: daily.  Hypertension: Controlled well on losartan 50 mg daily BP Readings from Last 3 Encounters:  08/31/14 118/70  02/16/14 130/68  09/08/13 124/65  Using medication without problems or lightheadedness: None Chest pain with exertion:None Edema:None Short of breath:None Average home BPs:not checking Other issues:   Elevated Cholesterol: At  goal <100 on pravastatin.  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 He has been experiencing decreased libido and fatigue.  No erection difficulty... He can use low dose viagra but does not help much, mainly just doesn't care about sex.  He contacted prostate cancer MD... They wont start testosterone replacement in prostate cancer.   Social History /Family History/Past Medical History reviewed and updated if needed.  Review of Systems  Constitutional: Negative for fever, fatigue and unexpected weight change. Some ringing in ears.  HENT: Negative for ear pain, congestion, sore throat, rhinorrhea, trouble swallowing and postnasal drip.  Eyes: Negative for pain.  Respiratory: Negative for cough, shortness of breath and wheezing.  Cardiovascular: Negative for chest pain, palpitations and leg swelling.  Gastrointestinal: No abdominal pain. Negative for nausea, diarrhea, constipation and blood in stool.  Genitourinary: Negative for dysuria, urgency, hematuria, discharge, penile swelling, scrotal swelling, difficulty urinating, penile pain and testicular pain.  Musculoskeletal:  None  Skin: Negative for rash.  Neurological: Negative for syncope, weakness, light-headedness, numbness and headaches.  Psychiatric/Behavioral: Negative for behavioral problems  and dysphoric mood. The patient is not nervous/anxious.  Objective:   Physical Exam  Constitutional: He appears well-developed and well-nourished. Non-toxic appearance. He does not appear ill. No distress.  HENT:  Head: Normocephalic and atraumatic.   Right Ear: Hearing, tympanic membrane, external ear and ear canal normal.  Left Ear: Hearing, tympanic membrane, external ear and ear canal normal.  Nose: Nose normal.  Mouth/Throat: Uvula is midline, oropharynx is clear and moist and mucous membranes are normal.  Eyes: Conjunctivae, EOM and lids are normal. Pupils are equal, round, and reactive to light. No foreign bodies found.  Neck: Trachea normal, normal range of motion and phonation normal. Neck supple. Carotid bruit is not present. No mass and no thyromegaly present.  Cardiovascular: Normal rate, regular rhythm, S1 normal, S2 normal, intact distal pulses and normal pulses. Exam reveals no gallop.  No murmur heard.  Pulmonary/Chest: Breath sounds normal. He has no wheezes. He has no rhonchi. He has no rales.  Abdominal: Soft. Normal appearance and bowel sounds are normal. There is no hepatosplenomegaly. There is no tenderness. There is no rebound, no guarding and no CVA tenderness. No hernia.  Genitourinary:not performed Lymphadenopathy:  He has no cervical adenopathy.  Neurological: He is alert. He has normal strength and normal reflexes. No cranial nerve deficit or sensory deficit. Gait normal.  Skin: Skin is warm, dry and intact. No rash noted.  Psychiatric: He has a normal mood and affect. His speech is normal and behavior is normal. Judgment normal.   Diabetic foot exam:  Normal inspection  No skin breakdown  No calluses  Normal DP pulses  Normal sensation to light touch and monofilament  Nails: pain and slight erythema at left great toenail edge, nail ingrown  Assessment & Plan:   The patient's preventative maintenance and recommended screening tests for an annual wellness exam were reviewed in full today.  Brought up to date unless services declined.  Counselled on the importance of diet, exercise, and its role in overall health and mortality.  The patient's FH and SH was reviewed, including their home  life, tobacco status, and drug and alcohol status.   Colon: Hx of polyps:02/2011 in [place] , one polyp benign path. Repeat in 10 year. 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 Completed trelstar. PSA last on 08/30/2014 Former remote smoker about 7 years. No SOB, no daily cough. No fall in last year.  Negative depression screen.

## 2014-08-31 NOTE — Addendum Note (Signed)
Addended by: Ellamae Sia on: 08/31/2014 11:26 AM   Modules accepted: Orders

## 2014-08-31 NOTE — Patient Instructions (Addendum)
Stop at lab on way out. Keep up the regular exercise. Work on low [employer].

## 2014-09-02 ENCOUNTER — Encounter: Payer: Self-pay | Admitting: *Deleted

## 2014-09-06 ENCOUNTER — Other Ambulatory Visit: Payer: Self-pay

## 2014-09-28 ENCOUNTER — Encounter: Payer: Self-pay | Admitting: Family Medicine

## 2014-11-06 ENCOUNTER — Other Ambulatory Visit: Payer: Self-pay | Admitting: Family Medicine

## 2014-11-08 ENCOUNTER — Other Ambulatory Visit: Payer: Self-pay | Admitting: Family Medicine

## 2014-11-12 ENCOUNTER — Other Ambulatory Visit: Payer: Self-pay | Admitting: Family Medicine

## 2014-11-12 ENCOUNTER — Encounter: Payer: Self-pay | Admitting: Family Medicine

## 2014-11-23 ENCOUNTER — Other Ambulatory Visit: Payer: Self-pay | Admitting: Family Medicine

## 2014-11-23 ENCOUNTER — Encounter: Payer: Self-pay | Admitting: Family Medicine

## 2014-11-23 DIAGNOSIS — C61 Malignant neoplasm of prostate: Secondary | ICD-10-CM | POA: Diagnosis not present

## 2014-11-23 NOTE — Telephone Encounter (Signed)
In outbox. Pt notified.

## 2014-11-24 LAB — PSA: Prostate Specific Ag, Serum: 0.7 ng/mL (ref 0.0–4.0)

## 2014-11-30 ENCOUNTER — Encounter: Payer: Self-pay | Admitting: Family Medicine

## 2014-12-01 NOTE — Telephone Encounter (Signed)
Dr. Jacinto Reap, I would recommend calling the patient.   With a PSA of 0.7 even if prior prostate cancer, surveillance is appropriate. Appropriate to involve urology even when stage IV.  If desires different non-[facility] MD, Dr. John Giovanni is excellent. [facility] or [facility] is another option.  My colleague Dr. Kerry Fort at [facility] is subspecialty trained with the latest surgical techniques.  [facility] multiple MD's.   Dr. Dutch Gray is the regional expert with prostate cancer at [facility].  Hope this helps.  Electronically Signed  By: Owens Loffler, MD On: 12/01/2014 2:22 PM

## 2014-12-02 ENCOUNTER — Other Ambulatory Visit: Payer: Self-pay | Admitting: Family Medicine

## 2014-12-02 DIAGNOSIS — C61 Malignant neoplasm of prostate: Secondary | ICD-10-CM

## 2014-12-03 ENCOUNTER — Encounter: Payer: Self-pay | Admitting: Family Medicine

## 2014-12-03 ENCOUNTER — Telehealth: Payer: Self-pay | Admitting: Oncology

## 2014-12-03 NOTE — Telephone Encounter (Signed)
Contact pt regarding new pt appt on 9/29. Pt's family confirmed appt

## 2014-12-09 ENCOUNTER — Ambulatory Visit (HOSPITAL_BASED_OUTPATIENT_CLINIC_OR_DEPARTMENT_OTHER): Payer: Medicare Other | Admitting: Oncology

## 2014-12-09 VITALS — BP 152/90 | HR 74 | Temp 98.2°F | Resp 16 | Ht 72.0 in | Wt 196.6 lb

## 2014-12-09 DIAGNOSIS — C61 Malignant neoplasm of prostate: Secondary | ICD-10-CM

## 2014-12-09 DIAGNOSIS — R197 Diarrhea, unspecified: Secondary | ICD-10-CM

## 2014-12-09 NOTE — Progress Notes (Signed)
Please see consult note.  

## 2014-12-09 NOTE — Consult Note (Signed)
Reason for Referral:  Prostate cancer.  HPI:  74-year-old gentleman native of [place] but currently lives in [place]. 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 He was initially found to have an elevated PSA dating back to 2009. His PSA was 3.17 and underwent a biopsy by Dr. Nesi  and his Gleason score 3+3 = 6. He elected to pursue a course of watchful waiting and his PSA was up to 6.34 in July 2010. Repeat biopsy was benign without any evidence of malignancy. He had a repeat PSA in July 2011 after his PSA was up to 8.6. At that time  He was found to have a Gleason score 4+4 = 8. He subsequently was evaluated the [facility] for proton therapy. He received radiation therapy between October 2011 until December 2011. He received concomitant Taxotere chemotherapy at a low dose but it was unclear to me whether he had it as a part of a clinical trial.  He also received 6 months of hormone therapy up until July 2012. His PSA nadir was down to 0.1  In December 2013. His PSA continue to be low close to 0.1 and 0.2  Up till December 2015. In June 2016 was up to 0.4 and his most recent PSA in September 2016 was up to 0.7. Clinically he has no new symptoms at this time. He reports complications related to radiation therapy including diarrhea and urge incontinence at times. He reports 3-4 bowel movements in the morning but seems to be controlled in the afternoon. He reports no urinary symptoms at this time. No incontinence, nocturia or hematuria. He continues to be in excellent health and shape and performs activities of daily living without any decline.    Past Medical History  Diagnosis Date  . Osteoporosis   . Cerebrovascular accident   :  Past Surgical History  Procedure Laterality Date  . Tonsillectomy  03/13/1947  . Acl tear  03/12/1978  :   Current outpatient prescriptions:  .  Aspirin  (ADULT ASPIRIN LOW STRENGTH) 81 MG EC tablet, Take 81 mg by mouth daily.  , Disp: , Rfl:  .  cholecalciferol (VITAMIN D) 1000 UNITS tablet, Take 1,000 Units by mouth 2 (two) times daily.  , Disp: , Rfl:  .  guaiFENesin-codeine (ROBITUSSIN AC) 100-10 MG/5ML syrup, Take 5-10 mLs by mouth at bedtime as needed for cough., Disp: 180 mL, Rfl: 0 .  ketoconazole (NIZORAL) 2 % cream, Apply topically as needed.  , Disp: , Rfl:  .  losartan (COZAAR) 25 MG tablet, TAKE ONE TABLET BY MOUTH ONCE DAILY, Disp: 90 tablet, Rfl: 1 .  NON FORMULARY, Take 1 tablet by mouth daily. Pomi-T, Disp: , Rfl:  .  pravastatin (PRAVACHOL) 40 MG tablet, TAKE ONE TABLET BY MOUTH ONCE DAILY, Disp: 90 tablet, Rfl: 3 .  sildenafil (REVATIO) 20 MG tablet, Take 2 tablets (40 mg total) by mouth daily as needed., Disp: 30 tablet, Rfl: 0:  No Known Allergies:  Family History  Problem Relation Age of Onset  . Cerebral aneurysm Mother   . Diabetes Father   . Bradycardia Father   . Benign prostatic hyperplasia Father   . Supraventricular tachycardia Brother   . Supraventricular tachycardia Brother   :  Social History   Social History  . Marital Status: Married    Spouse Name: N/A  . Number of Children: 2  . Years of Education: N/A     Occupational History  . Retired    Social History Main Topics  . Smoking status: Former Research scientist (life sciences)  . Smokeless tobacco: Never Used  . Alcohol Use: 0.0 oz/week    0 Standard drinks or equivalent per week     Comment: Glass of wine daily  . Drug Use: No  . Sexual Activity: Not on file   Other Topics Concern  . Not on file   Social History Narrative   Exercising: 3-4 times a week.   Healthy eating habits.   Full Code   Has a living will, has HCPOA: Physiological scientist (reviewed 2015)  :  Pertinent items are noted in HPI.  Exam: ECOG 0 Blood pressure 152/90, pulse 74, temperature 98.2 F (36.8 C), temperature source Oral, resp. rate 16, height 6' (1.829 m), weight 196 lb 9 oz (89.16 kg),  SpO2 97 %. General appearance: alert and cooperative Head: Normocephalic, without obvious abnormality Throat: lips, mucosa, and tongue normal; teeth and gums normal Neck: no adenopathy Back: negative Resp: clear to auscultation bilaterally Chest wall: no tenderness Cardio: regular rate and rhythm, S1, S2 normal, no murmur, click, rub or gallop Extremities: extremities normal, atraumatic, no cyanosis or edema Pulses: 2+ and symmetric Skin: Skin color, texture, turgor normal. No rashes or lesions Lymph nodes: Cervical, supraclavicular, and axillary nodes normal.    Assessment and Plan:    74 year old gentleman with the following issues:  1. Prostate cancer initially diagnosed in 2009 with a Gleason score 3+3 = 6 and a PSA of 3.17. After a period of observation and surveillance he was found to have a high-grade Gleason score 4+4 = 8 cancer with a PSA up to 8.6. He was treated with a definitive hormonal therapy utilizing proton therapy at the [facility] in October 2011. He received a 6 months of hormone therapy that was completed in July 2012. His PSA nadir down to 0.1. Most recently his PSA started rising slowly and his PSA in December 2015 was 0.2 and was up to 0.4 6 months later and 0.7 in September 2016.  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  The natural course of this cancer was discussed today and different staging modalities as well as treatment options. I feel at this time given the slow progression of his disease it is very reasonable to continue observation and stage him with the appropriate imaging studies. Whether that would be PET scan using fluoride or other modalities.   After a period of observation and surveillance, hormone therapy would be indicated if he develops a rapid doubling time of his PSA or measurable disease by imaging studies.  He is interested in getting opinions from radiation oncology as well as urology  As  per tenderness to his cancer. I have suggested the prostate cancer multidisciplinary clinic to have all his questions answered in one setting. Information about this clinic was given to the patient today and he was interested in attending the clinic. He also met with Cira Rue; the prostate cancer navigator and all his questions were answered.   2.  Diarrhea: Related to his previous radiation therapy and seems to be manageable at this time.

## 2014-12-13 ENCOUNTER — Encounter: Payer: Self-pay | Admitting: Medical Oncology

## 2014-12-13 NOTE — Progress Notes (Signed)
Oncology Nurse Navigator Documentation  Oncology Nurse Navigator Flowsheets 12/09/2014  Referral date to RadOnc/MedOnc 12/09/2014  Navigator Encounter Type Initial MedOnc- I met [name] and his wife during his visit with Dr. Alen Blew. He is interested in coming to the [facility] [place] 10/14. I gave them my business card and explained the clinic to them. All questions were answered. He informed that he had treatment at the [facility]. I will request these records. I informed patient I will mail him a packet of information regarding the clinic. I asked them to call me with any questions or concerns. They voiced understanding.  Patient Visit Type Medonc  Barriers/Navigation Needs No barriers at this time  Time Spent with Patient 15   I  introduced myself as the Prostate Nurse Navigator and the Coordinator of the [facility] [place] while patient here to see Dr. Alen Blew.  1. I confirmed with the patient he will be seen in the Prostate [place] 10/14 arriving at 7:45am.   2. I discussed the format of the clinic and the physicians he will be seeing that day.  3. I discussed where the clinic is located and how to contact me. I gave him a business card.  4. I confirmed his address and informed him I would be mailing a packet of information and forms to be completed. I asked him to bring them with him the day of his appointment.   He voiced understanding of the above. I asked him to call me if he has any questions or concerns regarding his appointments or the forms he needs to complete.

## 2014-12-15 ENCOUNTER — Encounter: Payer: Self-pay | Admitting: *Deleted

## 2014-12-15 ENCOUNTER — Telehealth: Payer: Self-pay | Admitting: Medical Oncology

## 2014-12-15 NOTE — Telephone Encounter (Signed)
Oncology Nurse Navigator Documentation  Oncology Nurse Navigator Flowsheets 12/09/2014 12/14/2014  Referral date to RadOnc/MedOnc 12/09/2014 -  Navigator Encounter Type Initial MedOnc Telephone- I called Mr. Sek to inform him I need a release of information document signed to get his records and slides. I also asked him for  names of physicians and centers where he was treated. He states he had his last biopsy done with Dr. Jacqlyn Larsen now at [facility] but formerly [facility] in [place]. He had  Proton and chemo done at [facility]. Release of information obtained.  Patient Visit Type MedOnc -  Barriers/Navigation Needs No barriers at this time -  Interventions - Coordination of Care  Time Spent with Patient 15 15

## 2014-12-15 NOTE — Telephone Encounter (Signed)
Oncology Nurse Navigator Documentation  Oncology Nurse Navigator Flowsheets 12/14/2014 12/14/2014 12/15/2014  Referral date to RadOnc/MedOnc - - -  Navigator Encounter Type Telephone Telephone- Called Dr. Cop[e's office, spoke with Abigail Butts to request records. Release of information faxed. Telephone  Patient Visit Type - - -  Barriers/Navigation Needs - - -  Interventions Coordination of Care Coordination of Care Coordination of Care  Time Spent with Patient 15 15 15

## 2014-12-15 NOTE — Telephone Encounter (Signed)
Oncology Nurse Navigator Documentation  Oncology Nurse Navigator Flowsheets 12/09/2014 12/14/2014 12/15/2014  Referral date to RadOnc/MedOnc 12/09/2014 - -  Navigator Encounter Type Initial MedOnc Telephone I called [facility] to request biopsy slides on the patient. I was given fax number [phone] to request sides. Fax sent.  Patient Visit Type Medonc - -  Barriers/Navigation Needs No barriers at this time - -  Interventions - Coordination of Care Coordination of Care  Time Spent with Patient 15 15 15

## 2014-12-16 ENCOUNTER — Encounter: Payer: Self-pay | Admitting: *Deleted

## 2014-12-17 DIAGNOSIS — Z23 Encounter for immunization: Secondary | ICD-10-CM | POA: Diagnosis not present

## 2014-12-22 ENCOUNTER — Encounter: Payer: Self-pay | Admitting: Adult Health

## 2014-12-22 ENCOUNTER — Other Ambulatory Visit: Payer: Self-pay | Admitting: Oncology

## 2014-12-22 ENCOUNTER — Encounter: Payer: Self-pay | Admitting: Radiation Oncology

## 2014-12-22 DIAGNOSIS — C61 Malignant neoplasm of prostate: Secondary | ICD-10-CM | POA: Diagnosis not present

## 2014-12-22 NOTE — Progress Notes (Signed)
GU Location of Tumor / Histology: prostatic adenocarcinoma  If Prostate Cancer, Gleason Score is (4 + 4) and PSA is (8.6) at biopsy in 2011. PSA of 0.4 on 08/31/14.  Kayleb Warshaw presented in the summer of 2008 with an elevated PSA but, elected active surveillance. Then, PSA doubled in the summer of 2011.  Past/Anticipated interventions by urology, if any: active surveillance and androgen deprivation  Past/Anticipated interventions by medical oncology, if any: radiosensitizing chemotherapy with Taxotere and proton therapy  Weight changes, if any: no  Bowel/Bladder complaints, if any: rectal urgency, rectal frequency, and pain associated with orgasms   Nausea/Vomiting, if any: no  Pain issues, if any:    SAFETY ISSUES:  Prior radiation? yes  Pacemaker/ICD? no  Possible current pregnancy? no  Is the patient on methotrexate? no  Current Complaints / other details:  74 year old male. Married. Retired from [employer].

## 2014-12-23 ENCOUNTER — Telehealth: Payer: Self-pay | Admitting: Medical Oncology

## 2014-12-23 NOTE — Telephone Encounter (Signed)
Oncology Nurse Navigator Documentation  Oncology Nurse Navigator Flowsheets 12/15/2014 12/23/2014 12/23/2014  Referral date to RadOnc/MedOnc - - -  Navigator Encounter Type Telephone Telephone Telephone- Alexander Yang called to confirm he would be coming to [facility] [place] arriving at 7:45am. He will bring his completed medical forms.   Patient Visit Type - - -  Barriers/Navigation Needs - - -  Interventions Coordination of Care Other -  Time Spent with Patient 15 - 44

## 2014-12-23 NOTE — Telephone Encounter (Signed)
Oncology Nurse Navigator Documentation  Oncology Nurse Navigator Flowsheets 12/14/2014 12/15/2014 12/23/2014  Referral date to RadOnc/MedOnc - - -  Navigator Encounter Type Telephone Telephone Telephone-Left Alexander Yang a message to confirm his appointment 10/14 arriving at 7:45am. I requested that he bring his completed medical forms with him. I asked him to return my call to confirm.  Patient Visit Type - - -  Barriers/Navigation Needs - - -  Interventions Coordination of Care Coordination of Care Other  Time Spent with Patient 15 15 -

## 2014-12-24 ENCOUNTER — Encounter: Payer: Self-pay | Admitting: Radiation Oncology

## 2014-12-24 ENCOUNTER — Ambulatory Visit
Admission: RE | Admit: 2014-12-24 | Discharge: 2014-12-24 | Disposition: A | Discharge status: Home / Self Care | Payer: Medicare Other | Source: Ambulatory Visit | Attending: Radiation Oncology | Admitting: Radiation Oncology

## 2014-12-24 ENCOUNTER — Ambulatory Visit (HOSPITAL_BASED_OUTPATIENT_CLINIC_OR_DEPARTMENT_OTHER): Payer: Medicare Other | Admitting: Oncology

## 2014-12-24 ENCOUNTER — Telehealth: Payer: Self-pay | Admitting: Oncology

## 2014-12-24 ENCOUNTER — Encounter: Payer: Self-pay | Admitting: Medical Oncology

## 2014-12-24 ENCOUNTER — Encounter: Payer: Self-pay | Admitting: General Practice

## 2014-12-24 VITALS — BP 161/74 | HR 60 | Temp 97.7°F | Resp 16 | Ht 73.0 in | Wt 198.7 lb

## 2014-12-24 DIAGNOSIS — R197 Diarrhea, unspecified: Secondary | ICD-10-CM

## 2014-12-24 DIAGNOSIS — C61 Malignant neoplasm of prostate: Secondary | ICD-10-CM | POA: Diagnosis not present

## 2014-12-24 HISTORY — DX: Other specified health status: Z78.9

## 2014-12-24 HISTORY — DX: Essential (primary) hypertension: I10

## 2014-12-24 HISTORY — DX: Type 2 diabetes mellitus without complications: E11.9

## 2014-12-24 NOTE — Telephone Encounter (Signed)
lvm for pt regarding to NOV appt.. °

## 2014-12-24 NOTE — Progress Notes (Signed)
[place] Psychosocial Distress Screening Spiritual Care  Shadowed by Counseling Intern Vaughan Sine, met with Mr and Ms Dais to introduce [place] team/resources, reviewing distress screen per protocol.  The patient scored a 1 on the Psychosocial Distress Thermometer which indicates mild distress. Also assessed for distress and other psychosocial needs.   ONCBCN DISTRESS SCREENING 12/24/2014  Screening Type Initial Screening  Distress experienced in past week (1-10) 1  Family Problem type Children  Information Concerns Type Lack of info about treatment  Referral to support programs Yes  Other Spiritual Care, Counseling Intern   Mr Roedl was in good spirits, noting that his age and life situation (retired, adult children) reduce distress.  He and his wife Margaretmary Lombard provide significant childcare for their grandchildren, which is a significant source of joy and meaning-making, as well as a needed contribution to extended family.  Married for 51 years, they balance autonomy (making individual decisions about healthcare) and mutual support (attending medical appts together for information/context).  Their coping approach is to take one day at a time, enjoying the present moment, and using perspective and gratitude ('other people have much bigger struggles; we're lucky').  Mr Karnes was struck by how much support is available at [facility] now, compared to his previous experience (5-7 years ago); this difference helped couple feel cared for, further decreasing distress.    Follow up needed: No. Pt/family aware of ongoing Support Team availability, including contact info and programming resources.  Please also page as needs arise.  Thank you.  [place], [place], [place] [facility] Pager [phone] Voicemail  [phone]

## 2014-12-24 NOTE — Progress Notes (Signed)
Radiation Oncology         [phone] ________________________________  Multidisciplinary Prostate Cancer Clinic  Initial Radiation Oncology Consultation  Name: Alexander Yang MRN: [id number]  Date: 12/24/2014  DOB: 09-Jun-1940  CC:[name], MD  Raynelle Bring, MD   REFERRING PHYSICIAN: Raynelle Bring, MD  DIAGNOSIS: 74 y.o. gentleman with rising PSA of 0.7 following definitive proton therapy with androgen deprivation 5 years ago.      ICD-9-CM ICD-10-CM   1. Prostate cancer ([place]) [place] is a 74 y.o. gentleman who was initially found to have a rising PSA in the summer of 2008.  PSA was only 3.17 at that time.  The patient underwent prostate biopsy with Dr. Janice Norrie and it showed no cancer.  PSA subsequently rose to 5.09 in October 2009.  Repeat prostate biopsy showed gleason 3+3 disease.  The patient's PSA continued to rise to 7.8 and he pursued MRI imaging of the prostate in 2011.  This showed a focus of suspicious tissue in the left apex.  Accordingly, targeted biopsies of that area revealed gleason 4+4 disease.  Now, having high risk prostate cancer, the patient underwent proton therapy from October through December 2011.  He was treated on protocol with Docetaxel concurrently plus 6 months of hormone therapy.  His PSA levels initially responded reaching a matter of 0.1 two years after completion.  His PSA remained 0.1 on 05/26/13.  His PSA increased to 0.2 on 02/22/14.  It increased to 0.4 on 08/31/14, and most recently increased to 0.7 in September 2016.  He has been referred to the GU tumor clinic to discuss his current status and options.    PREVIOUS RADIATION THERAPY: Yes as above.  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    PAST SURGICAL HISTORY: Past Surgical History  Procedure Laterality Date  . Tonsillectomy  03/13/1947  . Acl tear  03/12/1978  . Prostate biopsy    . Vasectomy      FAMILY HISTORY: family history includes Benign prostatic hyperplasia in his father; Bradycardia in his father; Cerebral aneurysm in his mother; Diabetes in his father; Supraventricular tachycardia in his brother and brother.  SOCIAL HISTORY:  reports that he quit smoking about 49 years ago. His smoking use included Cigarettes. He has a 20 pack-year smoking history. He has never used smokeless tobacco. He reports that he drinks alcohol. He reports that he does not use illicit drugs.  ALLERGIES: Review of patient's allergies indicates no known allergies.  MEDICATIONS:  Current Outpatient Prescriptions  Medication Sig Dispense Refill  . losartan (COZAAR) 25 MG tablet TAKE ONE TABLET BY MOUTH ONCE DAILY 90 tablet 1  . pravastatin (PRAVACHOL) 40 MG tablet TAKE ONE TABLET BY MOUTH ONCE DAILY 90 tablet 3   No current facility-administered medications for this encounter.    REVIEW OF SYSTEMS:  A 15 point review of systems is documented in the electronic medical record. This was obtained by the nursing staff. However, I reviewed this with the patient to discuss relevant findings and make appropriate changes.  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  The patient completed an  IPSS and IIEF questionnaire.  His IPSS score was 0 indicating mild urinary outflow obstructive symptoms.  He indicated that his erectile function is able to complete sexual activity about half the time.   PHYSICAL EXAM: This patient is in no acute distress.  He is alert and oriented.   height is 6\' 1"  (1.854 m) and weight is 198  lb 11.2 oz (90.13 kg). His oral temperature is 97.7 F (36.5 C). His blood pressure is 161/74 and his pulse is 60. His respiration is 16 and oxygen saturation is 100%.  He exhibits no respiratory distress or labored breathing.  He appears neurologically intact.  His mood is pleasant.  His affect is appropriate.  Please note the digital rectal exam findings described above.  KPS = 90  100 - Normal; no complaints; no evidence of disease. 90   - Able to carry on normal activity; minor signs or symptoms of disease. 80   - Normal activity with effort; some signs or symptoms of disease. 72   - Cares for self; unable to carry on normal activity or to do active work. 60   - Requires occasional assistance, but is able to care for most of his personal needs. 50   - Requires considerable assistance and frequent medical care. 64   - Disabled; requires special care and assistance. 58   - Severely disabled; hospital admission is indicated although death not imminent. 39   - Very sick; hospital admission necessary; active supportive treatment necessary. 10   - Moribund; fatal processes progressing rapidly. 0     - Dead  Karnofsky DA, Abelmann [place], Craver LS and Burchenal [facility] [phone]) The use of the nitrogen mustards in the palliative treatment of carcinoma: with particular reference to bronchogenic carcinoma Cancer 1 [phone]   LABORATORY DATA:  No results found for: WBC, HGB, HCT, MCV, PLT Lab Results  Component Value Date   NA 138 08/13/2014   K 4.4 08/13/2014   CL 104 08/13/2014   CO2 30 08/13/2014   Lab Results  Component Value Date   ALT 22 08/13/2014   AST 17 08/13/2014   ALKPHOS 44 08/13/2014   BILITOT 0.8 08/13/2014     RADIOGRAPHY: No results found.    IMPRESSION: This gentleman is a 74 yo gentleman with a rising PSA of 0.7 following definitive proton therapy with androgen deprivation 5 years ago.  His rising PSA could reflect the return of testosterone following androgen deprivation,  benign fluctuation, or the beginning of biochemical recurrence.  He does not meet the current definition for biochemical failure per se.    PLAN: Today, I talked to the patient and family about the findings and work-up thus far.  We discussed the natural history of rising PSA following definitive prostate radiotherapy and general treatment, highlighting the role of radiotherapy in the management.  We discussed the available radiation techniques, and focused on the details of logistics and delivery.  We reviewed the anticipated acute and late sequelae associated with radiation in this setting.  The patient was encouraged to ask questions that I answered to the best of my ability.    If the patient develops biochemical recurrence, this could reflect distant metastatic disease or this could reflect local failure.  If local failure is confirmed with biopsy and no metastatic disease is found, the patient may be eligible for variety of local salvage options.  He has reservations regarding HIFU and Cryo therapy due to potential toxicities.  We talked about salvage brachytherapy using  high dose rate and low dose rate techniques.  I'd be happy to explore this further with him if clinically indicated.    I spent 40 minutes minutes face to face with the patient and more than 50% of that time was spent in counseling and/or coordination of care.  This document serves as a record of services personally performed by Tyler Pita, MD. It was created on his behalf by Janace Hoard, a trained medical scribe. The creation of this record is based on the scribe's personal observations and the provider's statements to them. This document has been checked and approved by the attending provider.      ------------------------------------------------  Sheral Apley Tammi Klippel, M.D.

## 2014-12-24 NOTE — Addendum Note (Signed)
Addended by: Wyatt Portela on: 12/24/2014 10:40 AM   Modules accepted: Level of Service

## 2014-12-24 NOTE — Progress Notes (Addendum)
Patient denies a family history of prostate cancer. Retired Chartered certified accountant for [employer]. Married to [place]. Two children, Jenny Reichmann and Harrell Gave. Reports his last colonoscopy was in 2013. Reports he does not perform regular testicular exams. Reports bowel urgency and soft stools.

## 2014-12-24 NOTE — Progress Notes (Signed)
                               Care Plan Summary  Name: Alexander Yang     DOB:[birth year]    Your Medical Team:   Urologist -  Dr. Raynelle Bring, [facility]  Radiation Oncologist - Dr. Tyler Pita, [facility]   Medical Oncologist - Dr. Zola Button, [place]  Recommendations: 1) PSA 2) Pending results of PSA further restaging studies   * These recommendations are based on information available as of today's consult.      Recommendations may change depending on the results of further tests or exams.                              Next Steps: 1) Appointment for PSA 2) Dr. Alen Blew will call you with results and next steps in treatment plan.   When appointments need to be scheduled, you will be contacted by [facility] and/or [facility].  Questions?  Please do not hesitate to call Cira Rue, RN, BSN, CRNI at [phone] any questions or concerns.  Shirlean Mylar is your Oncology Nurse Navigator and is available to assist you while you're receiving your medical care at [facility].

## 2014-12-24 NOTE — Consult Note (Signed)
Chief Complaint  Prostate Cancer   Reason For Visit  Reason for consult: To discuss management of prostate cancer. Physician requesting consult: Dr. Zola Button PCP: Dr. Eliezer Lofts Location of consult: [facility]   History of Present Illness  Alexander Yang is a 74 year old gentleman who had previously been followed by Dr. Lowella Bandy at [facility]. He had undergone a prostate biopsy in October 2008 for a rising PSA of 3.17.  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  He elected to proceed with active surveillance and a repeat biopsy in September 2010 for continued rise of his PSA was negative except for one area of atypia. He was placed on finasteride with continue rise of his PSA up to 8.6 on therapy.  He subsequently sought a second opinion with Dr. Edrick Oh in [place], [place].  He underwent an MRI of the prostate followed by a repeat prostate biopsy in July 2011.  We did review his MRI today which did demonstrate a concerning lesion toward the right apex.  His biopsies from July 2011 confirmed disease at the right apex with Gleason 8 and Gleason 9 disease.  He was eventually seen at [facility] in [place], [place] for consultation.  He was recommended to undergo proton beam radiotherapy and 6 months of ADT.  He completed 81 Gy of proton beam radiation from October through December 2011 in conjunction with simultaneous docetaxel systemic chemotherapy on a clinical trial.  He actually received his androgen deprivation therapy following his radiation therapy rather than concomitantly.  He did develop radiation proctitis acutely and was followed by GI with local topical treatments provided.   His PSA reached a nadir of 0.1.  He was seen by Dr. Alen Blew in consultation on 12/09/14.  His  PSA has been gradually rising from 0.4 in June 2016 and 0.7 in September 2016.   He is currently asymptomatic aside from his ongoing rectal toxicity which includes rectal urgency and frequency particularly in the mornings.  He has not had recent hematochezia and find the symptoms to be mildly to moderately bothersome although he has learned to manage them quite effectively.  He denies incontinence or voiding complaints.  He states that he did tolerate androgen deprivation therapy relatively well when he was on treatment in the past although did not like the decreased libido was part of his therapy.  He has done extensive research regarding his prostate cancer situation including multiple second opinions/consultations, extensive reading, and exploration of clinical trials as well as regular visits to the prostate cancer support group.  He is seen today to discuss his current situation in detail as well as options for possible recurrent disease.   Past Medical History  1. History of hyperlipidemia (Z86.39)  2. History of hypertension (Z86.79)  3. History of osteoporosis (Z87.39)  4. 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 History of Stroke syndrome (I63.9)  Surgical History  1. History of Tonsillectomy  Current Meds  1. Aspirin 81 MG TABS;  Therapy: (Recorded:30Sep2008) to Recorded  2. Calcium Acetate CAPS;  Therapy: (Recorded:11Jan2011) to Recorded  3. Fish Oil CAPS;  Therapy: (Recorded:11Jan2011) to Recorded  4. Lycopene 10 MG Oral Capsule;  Therapy: (Recorded:13Apr2011) to Recorded  5. Pomegranate Extract CAPS;  Therapy: (Recorded:13Apr2011) to Recorded  6. Pravastatin Sodium TABS; 63m QD;  Therapy: (Recorded:21Apr2010) to Recorded  7. Vitamin D TABS;  Therapy: (Recorded:11Jan2011) to Recorded  Allergies  1. No Known Allergies  Family History  1. Family history of Death In The Family Mother : Father  2. Family history of Diabetes Mellitus  3. Family history of Family Health  Status Number Of Children : Father  Social History   Alcohol Use (History)   Marital History - Currently Married   Tobacco Use  Review of Systems AU Complete-Male:  Constitutional: no fever, no night sweats and no recent weight loss.    Physical Exam Constitutional: Well nourished and well developed . No acute distress.    Results/Data  We have reviewed his extensive medical records, pathology slides, imaging studies including his MRI from 2011 independently during conference today.  Findings are as outlined above.     Discussion/Summary  1.  Prostate cancer: I had a long and detailed discussion with Alexander Yang and his wife today regarding his prostate cancer situation.  Specifically, we discussed his rising PSA in the context of his prior treatment for high risk localized disease.  We reviewed the definition for biochemical recurrence following radiation therapy of nadir +2 which he has not yet met.  However, he understands that if his PSA continues to rise, this may be reflective of recurrent disease.  I recommended that he recheck his PSA over the next couple of months to see if the upward trend continues which obviously would raise more suspicion about recurrent disease.  If there was continued concern about recurrent disease, we discussed options of proceeding with further evaluation with consideration of local salvage therapy versus ongoing surveillance with utilization of systemic therapy in the future if/when necessary.  If he was potentially interested in local salvage therapy for recurrent disease, he understands that he would need to undergo further evaluation including a repeat prostate biopsy as well as staging imaging to rule out the presence of metastatic disease.  We also discussed possibly having an MRI performed prior to his biopsy that may aid in more directed biopsies with either cognitive fusion or MR fusion.  He also stated that he likely would travel to certain  institutions to proceed with more investigational imaging studies such as PET imaging if necessary to rule out the presence of metastatic disease in that situation.  We then discussed options for salvage local therapy including salvage prostatectomy, salvage ablative therapies, salvage local radiation therapy, etc.  He understands the potential for morbidity associated with these procedures and we've reviewed those in detail today.  We also discussed the option of foregoing local curative salvage treatment in the setting of a recurrence and instead taking a non-curative approach of systemic therapy.  We discussed the timing for institution of systemic therapy which would be based on PSA doubling time or development of measurable metastatic disease.  He understands that this still could be many years after development of biochemical recurrence.  We also discussed available systemic therapies and he has discussed these with Dr. Alen Blew in detail as well.  He understands the potential toxicities and risks associated with these therapies and we did review those in more detail today.  He has multiple questions which were answered to his stated satisfaction.  He will notify me if I can be of any further assistance to him.  Cc: Dr. Eliezer Lofts Dr. Zola Button Dr. Tyler Pita   A total of 68 minutes were spent in the overall care of the patient today with 68 minutes in  direct face to face consultation.    Signatures Electronically signed by : Raynelle Bring, M.D.; Dec 24 2014 12:37PM EST

## 2014-12-24 NOTE — Progress Notes (Signed)
Hematology and Oncology Follow Up Visit  Sender Rueb [id number] [birth year] 74 y.o. 12/24/2014 10:26 AM [name], [name], [name], MD   Principle Diagnosis: 74 year old gentleman with the prostate cancer diagnosed in 2009 he had a Gleason score 3+3 = 6 PSA of 3.17. He subsequently developed more aggressive cancer Gleason score 4+4 = 8, PSA 8.6 in 2011.   Prior Therapy: Observation and surveillance between 2009 in 2011. He was found to have a Gleason score 4+4 = 8 in 2011 and PSA of . He subsequently was evaluated the [facility] for proton therapy. He received radiation therapy between October 2011 until December 2011. He received concomitant Taxotere chemotherapy at a low dose but it was unclear to me whether he had it as a part of a clinical trial. He also received 6 months of hormone therapy up until July 2012. His PSA nadir was down to 0.1 In December 2013. His PSA continue to be low close to 0.1 and 0.2 Up till December 2015. In June 2016 was up to 0.4 and his most recent PSA in September 2016 was up to 0.7.    Current therapy: Observation and surveillance.  Interim History:  Mr. Alms was seen today in the prostate cancer multidisciplinary clinic. Clinically he feels relatively same without any major changes in his performance status or activity level. He continues to have some occasional GI symptoms in the morning time. Does not report any urinary symptoms. He continues to be active and performs activities of daily living without any decline. Has not reported any back pain or pathological fractures. Has not reported any falls or syncope.  Medications: I have reviewed the patient's current medications.  Current Outpatient Prescriptions  Medication Sig Dispense Refill  . losartan (COZAAR) 25 MG tablet TAKE ONE TABLET BY MOUTH ONCE DAILY 90 tablet 1  . pravastatin (PRAVACHOL) 40 MG tablet TAKE ONE TABLET BY MOUTH ONCE DAILY 90 tablet 3   No current  facility-administered medications for this visit.     Allergies: No Known Allergies  Past Medical History, Surgical history, Social history, and Family History were reviewed and updated.    Lab Results: No results found for: WBC, HGB, HCT, MCV, PLT   Chemistry      Component Value Date/Time   NA 138 08/13/2014 0835   K 4.4 08/13/2014 0835   CL 104 08/13/2014 0835   CO2 30 08/13/2014 0835   BUN 18 08/13/2014 0835   CREATININE 0.82 08/13/2014 0835      Component Value Date/Time   CALCIUM 9.2 08/13/2014 0835   ALKPHOS 44 08/13/2014 0835   AST 17 08/13/2014 0835   ALT 22 08/13/2014 0835   BILITOT 0.8 08/13/2014 0835       Radiological Studies:  MRI from 2011 was reviewed with radiology today. And this showed a focal spot of potential cancer in the left apex.  Impression and Plan:   74 year old gentleman with the following issues:  1. Prostate cancer initially diagnosed in 2009 with a Gleason score 3+3 = 6 and a PSA of 3.17. After a period of observation and surveillance he was found to have a high-grade Gleason score 4+4 = 8 cancer with a PSA up to 8.6. He was treated with a definitive hormonal therapy utilizing proton therapy at the [facility] in October 2011. He received a 6 months of hormone therapy that was completed in July 2012. His PSA nadir down to 0.1. Most recently his PSA started rising slowly and his PSA  in December 2015 was 0.2 and was up to 0.4 6 months later and 0.7 in September 2016. 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  His case was discussed extensively in the prostate cancer multidisciplinary clinic. Imaging studies were reviewed by radiology. His pathology was also discussed by the reviewing pathologist.  The recommendation at this point is to continue to follow his PSA closely in the immediate future. If his PSA continues to rise slowly, imaging studies likely with an MRI would be necessary to rule  out local relapse. If local relapses detected, local therapy could be considered including cryoablation or possible brachii therapy. These options were discussed by Dr. Tammi Klippel and Dr. Alinda Money respectively.  If no local therapy has been detected it is possible that he could be developing systemic disease and imaging studies including bone scan and CT scan will be warranted at that time.  The plan is to repeat his PSA in the next 2-3 weeks and decide about the next steps moving forward.  2. Diarrhea: Related to his previous therapy seems to be manageable at this time.   [facility], MD 10/14/201610:26 AM

## 2014-12-24 NOTE — Addendum Note (Signed)
Encounter addended by: Raynelle Bring, MD on: 12/24/2014 12:38 PM<BR>     Documentation filed: Clinical Notes

## 2014-12-27 ENCOUNTER — Telehealth: Payer: Self-pay | Admitting: Medical Oncology

## 2014-12-27 ENCOUNTER — Encounter: Payer: Self-pay | Admitting: *Deleted

## 2014-12-27 ENCOUNTER — Encounter: Payer: Self-pay | Admitting: Medical Oncology

## 2014-12-27 NOTE — Progress Notes (Signed)
Script given to robin bass prostate navigator, for PSA to be drawn at outside lab and results to be faxed to dr shadad @ [phone]

## 2014-12-27 NOTE — Telephone Encounter (Signed)
Oncology Nurse Navigator Documentation  Oncology Nurse Navigator Flowsheets 12/23/2014 12/24/2014 12/27/2014  Referral date to RadOnc/MedOnc - - -  Navigator Encounter Type Telephone Clinic/MDC Telephone- I called pt to inform him that Dr. Alen Blew is fine with him getting his PSA drawn at [facility]. I sent him a message via My Chart to let him know and prescription will be given to patient or faxed.  Patient Visit Type - - -  Barriers/Navigation Needs - No barriers at this time -  Interventions - - Coordination of Care  Support Groups/Services - Prostate;Friends and Family -  Time Spent with Patient 15 15 -

## 2014-12-27 NOTE — Telephone Encounter (Signed)
Oncology Nurse Navigator Documentation  Oncology Nurse Navigator Flowsheets 12/24/2014 12/24/2014 12/27/2014  Referral date to RadOnc/MedOnc - - -  Navigator Encounter Type Clinic/MDC Telephone- Alexander Yang called asking if Dr. Alen Blew would be ok if he gets his PSA drawn at [facility]. He has had all his labs drawn there and would like to continue there so they will be all be drawn by the same lab. I will ask Dr. Alen Blew and call him back. Telephone  Patient Visit Type - - -  Barriers/Navigation Needs No barriers at this time - -  Interventions - Coordination of Care Coordination of Care  Support Groups/Services Prostate;Friends and Family - -  Time Spent with Patient 15 15 -

## 2014-12-28 ENCOUNTER — Other Ambulatory Visit: Payer: Self-pay | Admitting: Medical Oncology

## 2014-12-31 DIAGNOSIS — C61 Malignant neoplasm of prostate: Secondary | ICD-10-CM | POA: Diagnosis not present

## 2015-01-03 ENCOUNTER — Telehealth: Payer: Self-pay | Admitting: *Deleted

## 2015-01-03 ENCOUNTER — Other Ambulatory Visit: Payer: Self-pay | Admitting: Oncology

## 2015-01-03 NOTE — Telephone Encounter (Signed)
-----   Message from Wyatt Portela, MD sent at 01/03/2015  8:48 AM EDT ----- Please let him know that his PSA from 10/21 was 0.8.   Thanks,   FS

## 2015-01-03 NOTE — Progress Notes (Signed)
Patient last PSA obtained at [facility] on 12/31/2014 was 0.8. The result will be called to the patient.

## 2015-01-03 NOTE — Telephone Encounter (Signed)
Per Dr. Alen Blew, I informed patient that his PSA level was 0.8. Patient verbalized understanding.

## 2015-01-11 ENCOUNTER — Other Ambulatory Visit: Payer: Medicare Other

## 2015-01-12 ENCOUNTER — Ambulatory Visit (HOSPITAL_BASED_OUTPATIENT_CLINIC_OR_DEPARTMENT_OTHER): Payer: Medicare Other | Admitting: Oncology

## 2015-01-12 ENCOUNTER — Telehealth: Payer: Self-pay | Admitting: Oncology

## 2015-01-12 ENCOUNTER — Encounter: Payer: Self-pay | Admitting: Medical Oncology

## 2015-01-12 VITALS — BP 132/83 | HR 70 | Temp 97.8°F | Resp 18 | Ht 73.0 in | Wt 195.6 lb

## 2015-01-12 DIAGNOSIS — C61 Malignant neoplasm of prostate: Secondary | ICD-10-CM

## 2015-01-12 DIAGNOSIS — R197 Diarrhea, unspecified: Secondary | ICD-10-CM

## 2015-01-12 NOTE — Progress Notes (Signed)
Hematology and Oncology Follow Up Visit  Eldredge Veldhuizen [id number] Apr 01, 1940 74 y.o. 01/12/2015 1:03 PM [name], [name], [name], MD   Principle Diagnosis: 74 year old gentleman with the prostate cancer diagnosed in 2009 he had a Gleason score 3+3 = 6 PSA of 3.17. He subsequently developed more aggressive cancer Gleason score 4+4 = 8, PSA 8.6 in 2011.   Prior Therapy: Observation and surveillance between 2009 in 2011. He was found to have a Gleason score 4+4 = 8 in 2011 and PSA of . He subsequently was evaluated the [facility] for proton therapy. He received radiation therapy between October 2011 until December 2011. He received concomitant Taxotere chemotherapy at a low dose but it was unclear to me whether he had it as a part of a clinical trial. He also received 6 months of hormone therapy up until July 2012. His PSA nadir was down to 0.1 In December 2013. His PSA continue to be low close to 0.1 and 0.2 Up till December 2015. In June 2016 was up to 0.4 and his most recent PSA in September 2016 was up to 0.7.    Current therapy: Observation and surveillance.  Interim History:  Mr. Leedy presents today for a follow-up visit. Since the last visit, he reports no new complaints. He feels relatively same without any major changes in his performance status or activity level. He continues to have some occasional GI symptoms in the morning time. Does not report any urinary symptoms. He continues to be active and performs activities of daily living without any decline. Has not reported any back pain or pathological fractures. Has not reported any falls or syncope. He continues to have soft bowel habits in the morning which subsides in the afternoon.  He does not report any headaches, blurry vision, seizures. Does not report any fevers, chills or sweats. Does not report any cough or hemoptysis. Desirable any nausea, vomiting or abdominal pain. Denies any frequency urgency or  hesitancy. Remaining review of systems unremarkable.  Medications: I have reviewed the patient's current medications.  Current Outpatient Prescriptions  Medication Sig Dispense Refill  . losartan (COZAAR) 25 MG tablet TAKE ONE TABLET BY MOUTH ONCE DAILY 90 tablet 1  . pravastatin (PRAVACHOL) 40 MG tablet TAKE ONE TABLET BY MOUTH ONCE DAILY 90 tablet 3   No current facility-administered medications for this visit.     Allergies: No Known Allergies  Past Medical History, Surgical history, Social history, and Family History were reviewed and updated.    Lab Results: No results found for: WBC, HGB, HCT, MCV, PLT   Chemistry      Component Value Date/Time   NA 138 08/13/2014 0835   K 4.4 08/13/2014 0835   CL 104 08/13/2014 0835   CO2 30 08/13/2014 0835   BUN 18 08/13/2014 0835   CREATININE 0.82 08/13/2014 0835      Component Value Date/Time   CALCIUM 9.2 08/13/2014 0835   ALKPHOS 44 08/13/2014 0835   AST 17 08/13/2014 0835   ALT 22 08/13/2014 0835   BILITOT 0.8 08/13/2014 0835         Impression and Plan:   74 year old gentleman with the following issues:  1. Prostate cancer initially diagnosed in 2009 with a Gleason score 3+3 = 6 and a PSA of 3.17. After a period of observation and surveillance he was found to have a high-grade Gleason score 4+4 = 8 cancer with a PSA up to 8.6. He was treated with a definitive hormonal therapy utilizing  proton therapy at the [facility] in October 2011. He received a 6 months of hormone therapy that was completed in July 2012. His PSA nadir down to 0.1. Most recently his PSA started rising slowly and his PSA in December 2015 was 0.2 and was up to 0.4 6 months later and 0.7 in September 2016. 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  His last PSA was up to 0.8 obtained on 10/142016. His last PSA was 0.17 November 2014. Given the minor changes in his PSA in the last few months, it is  reasonable to continue with observation and surveillance. His PSA continues to rise, I have urged him to follow up with Dr. Alinda Money regarding consideration for possible local therapy.  He has a PSA scheduled in the next 4-6 weeks and he'll have a follow-up at that time. Depending on the PSA, we'll determine further intervention is.  2. Diarrhea: Related to his previous therapy seems to be manageable at this time.   Zola Button, MD 11/2/20161:03 PM

## 2015-01-12 NOTE — Telephone Encounter (Signed)
Gave adn printed appt sched and avs fo rpt for Jan 2017 °

## 2015-02-17 ENCOUNTER — Telehealth: Payer: Self-pay | Admitting: *Deleted

## 2015-02-17 NOTE — Telephone Encounter (Signed)
On 02-17-15 fax medical records to [facility] it was prostate clinic note

## 2015-02-21 ENCOUNTER — Other Ambulatory Visit: Payer: Self-pay | Admitting: Family Medicine

## 2015-02-21 NOTE — Telephone Encounter (Signed)
Last office visit 08/31/2014.  Not on current medication list.  Refill?

## 2015-02-23 ENCOUNTER — Encounter: Payer: Self-pay | Admitting: Family Medicine

## 2015-02-23 DIAGNOSIS — Z8546 Personal history of malignant neoplasm of prostate: Secondary | ICD-10-CM | POA: Diagnosis not present

## 2015-03-01 ENCOUNTER — Ambulatory Visit (INDEPENDENT_AMBULATORY_CARE_PROVIDER_SITE_OTHER): Payer: Medicare Other | Admitting: Family Medicine

## 2015-03-01 ENCOUNTER — Encounter: Payer: Self-pay | Admitting: Family Medicine

## 2015-03-01 VITALS — BP 140/70 | HR 63 | Temp 98.4°F | Ht 73.0 in | Wt 198.8 lb

## 2015-03-01 DIAGNOSIS — B9789 Other viral agents as the cause of diseases classified elsewhere: Principal | ICD-10-CM

## 2015-03-01 DIAGNOSIS — J069 Acute upper respiratory infection, unspecified: Secondary | ICD-10-CM

## 2015-03-01 MED ORDER — GUAIFENESIN-CODEINE 100-10 MG/5ML PO SYRP
5.0000 mL | ORAL_SOLUTION | Freq: Every evening | ORAL | Status: DC | PRN
Start: 2015-03-01 — End: 2015-07-26

## 2015-03-01 NOTE — Patient Instructions (Signed)
Treat  symptomatically. Push fluids. Rest.  Call if not turing the corner improving in next 5-7 days. Call sooner if new fever or shortness of  breath.

## 2015-03-01 NOTE — Progress Notes (Addendum)
   Subjective:    Patient ID: Alexander Yang, male    DOB: [birth year], 74 y.o.   MRN: [id number]  Cough This is a new problem. The current episode started 1 to 4 weeks ago (  7 days of illness). The problem has been gradually worsening. The cough is productive of sputum. Pertinent negatives include no chills, ear congestion, ear pain, fever, hemoptysis, myalgias, nasal congestion, sore throat, shortness of breath or wheezing. The symptoms are aggravated by lying down (cannot sleep at night due to cough). Risk factors: non smoker. He has tried OTC cough suppressant for the symptoms. The treatment provided mild relief. There is no history of asthma, bronchiectasis, bronchitis, COPD or environmental allergies.    Social History /Family History/Past Medical History reviewed and updated if needed.   Review of Systems  Constitutional: Negative for fever and chills.  HENT: Negative for ear pain and sore throat.   Respiratory: Positive for cough. Negative for hemoptysis, shortness of breath and wheezing.   Musculoskeletal: Negative for myalgias.  Allergic/Immunologic: Negative for environmental allergies.       Objective:   Physical Exam  Constitutional: Vital signs are normal. He appears well-developed and well-nourished.  Non-toxic appearance. He does not appear ill. No distress.  HENT:  Head: Normocephalic and atraumatic.  Right Ear: Hearing, tympanic membrane, external ear and ear canal normal. No tenderness. No foreign bodies. Tympanic membrane is not retracted and not bulging.  Left Ear: Hearing, tympanic membrane, external ear and ear canal normal. No tenderness. No foreign bodies. Tympanic membrane is not retracted and not bulging.  Nose: Nose normal. No mucosal edema or rhinorrhea. Right sinus exhibits no maxillary sinus tenderness and no frontal sinus tenderness. Left sinus exhibits no maxillary sinus tenderness and no frontal sinus tenderness.  Mouth/Throat: Uvula is midline,  oropharynx is clear and moist and mucous membranes are normal. Normal dentition. No dental caries. No oropharyngeal exudate or tonsillar abscesses.  Eyes: Conjunctivae, EOM and lids are normal. Pupils are equal, round, and reactive to light. Lids are everted and swept, no foreign bodies found.  Neck: Trachea normal, normal range of motion and phonation normal. Neck supple. Carotid bruit is not present. No thyroid mass and no thyromegaly present.  Cardiovascular: Normal rate, regular rhythm, S1 normal, S2 normal, normal heart sounds, intact distal pulses and normal pulses.  Exam reveals no gallop.   No murmur heard. Pulmonary/Chest: Effort normal and breath sounds normal. No respiratory distress. He has no wheezes. He has no rhonchi. He has no rales.  Abdominal: Soft. Normal appearance and bowel sounds are normal. There is no hepatosplenomegaly. There is no tenderness. There is no rebound, no guarding and no CVA tenderness. No hernia.  Neurological: He is alert. He has normal reflexes.  Skin: Skin is warm, dry and intact. No rash noted.  Psychiatric: He has a normal mood and affect. His speech is normal and behavior is normal. Judgment normal.          Assessment & Plan:

## 2015-03-01 NOTE — Assessment & Plan Note (Signed)
Symptomatic care with cough suppressant.

## 2015-03-01 NOTE — Progress Notes (Signed)
Pre visit review using our clinic review tool, if applicable. No additional management support is needed unless otherwise documented below in the visit note. 

## 2015-03-07 ENCOUNTER — Encounter: Payer: Self-pay | Admitting: Oncology

## 2015-03-16 ENCOUNTER — Telehealth: Payer: Self-pay | Admitting: Oncology

## 2015-03-16 ENCOUNTER — Encounter: Payer: Self-pay | Admitting: Medical Oncology

## 2015-03-16 ENCOUNTER — Ambulatory Visit (HOSPITAL_BASED_OUTPATIENT_CLINIC_OR_DEPARTMENT_OTHER): Payer: Medicare Other | Admitting: Oncology

## 2015-03-16 VITALS — BP 138/85 | HR 72 | Temp 97.6°F | Resp 18 | Ht 73.0 in | Wt 195.2 lb

## 2015-03-16 DIAGNOSIS — R197 Diarrhea, unspecified: Secondary | ICD-10-CM | POA: Diagnosis not present

## 2015-03-16 DIAGNOSIS — C61 Malignant neoplasm of prostate: Secondary | ICD-10-CM | POA: Diagnosis not present

## 2015-03-16 NOTE — Progress Notes (Signed)
Hematology and Oncology Follow Up Visit  Alexander Yang [id number] 1940/08/04 75 y.o. 03/16/2015 10:23 AM [name], [name], [name], MD   Principle Diagnosis: 75 year old gentleman with the prostate cancer diagnosed in 2009 he had a Gleason score 3+3 = 6 PSA of 3.17. He subsequently developed more aggressive cancer Gleason score 4+4 = 8, PSA 8.6 in 2011.   Prior Therapy: Observation and surveillance between 2009 in 2011. He was found to have a Gleason score 4+4 = 8 in 2011 and PSA of . He subsequently was evaluated the [facility] for proton therapy. He received radiation therapy between October 2011 until December 2011. He received concomitant Taxotere chemotherapy at a low dose but it was unclear to me whether he had it as a part of a clinical trial. He also received 6 months of hormone therapy up until July 2012. His PSA nadir was down to 0.1 In December 2013. His PSA continue to be low close to 0.1 and 0.2 Up till December 2015.  In June 2016 was up to 0.4 and his most recent PSA in September 2016 was up to 0.7.    Current therapy: Observation and surveillance for biochemical relapse prostate cancer.  Interim History:  Alexander Yang presents today for a follow-up visit. Since the last visit, he continues to do very well. He reports no new complaints such as dysuria or hematuria. No major changes in his performance status or activity level. Has not reported any back pain or pathological fractures. Has not reported any falls or syncope. He continues to have soft bowel habits in the morning which subsides in the afternoon. His recent PSA did show slight increase up to 1.6.   He does not report any headaches, blurry vision, seizures. Does not report any fevers, chills or sweats. Does not report any cough or hemoptysis. Desirable any nausea, vomiting or abdominal pain. Denies any frequency urgency or hesitancy. Remaining review of systems unremarkable.  Medications: I have  reviewed the patient's current medications.  Current Outpatient Prescriptions  Medication Sig Dispense Refill  . guaiFENesin-codeine (ROBITUSSIN AC) 100-10 MG/5ML syrup Take 5-10 mLs by mouth at bedtime as needed for cough. 180 mL 0  . losartan (COZAAR) 25 MG tablet TAKE ONE TABLET BY MOUTH ONCE DAILY 90 tablet 1  . pravastatin (PRAVACHOL) 40 MG tablet TAKE ONE TABLET BY MOUTH ONCE DAILY 90 tablet 3   No current facility-administered medications for this visit.     Allergies: No Known Allergies  Past Medical History, Surgical history, Social history, and Family History were reviewed and updated.  Exam: ECOG 0 Blood pressure 138/85, pulse 72, temperature 97.6 F (36.4 C), temperature source Oral, resp. rate 18, height 6\' 1"  (1.854 m), weight 195 lb 3.2 oz (88.542 kg), SpO2 100 %.  General appearance: alert and cooperative well appearing gentleman without distress. Head: Normocephalic, without obvious abnormality Throat: No oral ulcers or lesions. Neck: no adenopathy Resp: clear to auscultation bilaterally no wheezes or rales to percussion.  Chest wall: no tenderness Cardio: regular rate and rhythm, S1, S2 normal, no murmur, click, rub or gallop Extremities: extremities normal, atraumatic, no cyanosis or edema Pulses: 2+ and symmetric Skin: Skin color, texture, turgor normal. No rashes or lesions Lymph nodes: Cervical, supraclavicular, and axillary nodes normal.   Last PSA in 02/2015 was 1.6      Impression and Plan:   75 year old gentleman with the following issues:  1. Prostate cancer initially diagnosed in 2009 with a Gleason score 3+3 = 6 and  a PSA of 3.17. After a period of observation and surveillance he was found to have a high-grade Gleason score 4+4 = 8 cancer with a PSA up to 8.6. He was treated with a definitive hormonal therapy utilizing proton therapy at the [facility] in October 2011. He received a 6 months of hormone therapy that was completed in  July 2012. His PSA nadir down to 0.1. Most recently his PSA started rising slowly and his PSA in December 2015 was 0.2 and was up to 0.4 6 months later and 0.7 in September 2016.   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  His last PSA was up to 1.6 in December 2016. Options of treatment were reviewed again today including local therapy and possible hormonal therapy. The logistics of hormonal therapy were also discussed today extensively. Complications were also reviewed including hot flashes, osteoporosis, weight gain, metabolic syndrome among others. For the time being we have opt to continued observation surveillance and repeat PSA in about 6 weeks. His PSA continues to rise rapidly, he will need staging workup and possible hormonal therapy at that time.   2. Diarrhea: Related to his previous therapy seems to be manageable at this time.   [facility] [address], MD 1/4/201710:23 AM

## 2015-03-16 NOTE — Telephone Encounter (Signed)
Gave pt appt for 04/26/15 @ 12pm.

## 2015-03-16 NOTE — Progress Notes (Signed)
Oncology Nurse Navigator Documentation  Oncology Nurse Navigator Flowsheets 12/27/2014 01/12/2015 03/16/2015  Navigator Location - - [facility]  Navigator Encounter Type Telephone - -  Patient Visit Type - Medonc MedOnc- Mr. Curnutt states he is doing well except his PSA is continuing to rise. In September his PSA was 0.7 and December it was 1.6. Per Dr. Alen Blew he will recheck the PSA  in 6 weeks. Pending results he may begin treatment with hormone therapy.  Treatment Phase - - Follow-up  Barriers/Navigation Needs - No barriers at this time No barriers at this time;No Needs  Interventions Coordination of Care - -  Support Groups/Services - Prostate;Friends and Family Prostate Support Group;Friends and Family  Time Spent with Patient - [id number]

## 2015-03-21 ENCOUNTER — Encounter: Payer: Self-pay | Admitting: Family Medicine

## 2015-03-21 DIAGNOSIS — C61 Malignant neoplasm of prostate: Secondary | ICD-10-CM

## 2015-03-29 DIAGNOSIS — C61 Malignant neoplasm of prostate: Secondary | ICD-10-CM | POA: Diagnosis not present

## 2015-04-14 DIAGNOSIS — Z6826 Body mass index (BMI) 26.0-26.9, adult: Secondary | ICD-10-CM | POA: Diagnosis not present

## 2015-04-14 DIAGNOSIS — C61 Malignant neoplasm of prostate: Secondary | ICD-10-CM | POA: Diagnosis not present

## 2015-04-15 ENCOUNTER — Encounter: Payer: Self-pay | Admitting: *Deleted

## 2015-04-15 ENCOUNTER — Encounter: Payer: Self-pay | Admitting: Oncology

## 2015-04-15 ENCOUNTER — Telehealth: Payer: Self-pay | Admitting: *Deleted

## 2015-04-15 NOTE — Telephone Encounter (Signed)
Patient wants to have labs drawn at outside office on the 10th instead of the 9th. Okay per dr Alen Blew. Patient verbalized understanding. Reminded to keep his next visit with dr Alen Blew 04/26/15.

## 2015-04-20 DIAGNOSIS — C61 Malignant neoplasm of prostate: Secondary | ICD-10-CM | POA: Diagnosis not present

## 2015-04-26 ENCOUNTER — Encounter: Payer: Self-pay | Admitting: Medical Oncology

## 2015-04-26 ENCOUNTER — Ambulatory Visit (HOSPITAL_BASED_OUTPATIENT_CLINIC_OR_DEPARTMENT_OTHER): Payer: Medicare Other | Admitting: Oncology

## 2015-04-26 ENCOUNTER — Telehealth: Payer: Self-pay | Admitting: Oncology

## 2015-04-26 VITALS — BP 148/77 | HR 56 | Temp 97.5°F | Resp 17 | Ht 73.0 in | Wt 198.0 lb

## 2015-04-26 DIAGNOSIS — C61 Malignant neoplasm of prostate: Secondary | ICD-10-CM | POA: Diagnosis not present

## 2015-04-26 NOTE — Progress Notes (Signed)
Oncology Nurse Navigator Documentation  Oncology Nurse Navigator Flowsheets 01/12/2015 03/16/2015 04/26/2015  Navigator Location - [facility] -  Navigator Encounter Type - - -  Patient Visit Type Medonc MedOnc MedOnc- Alexander Yang saw Dr. Alen Blew today . His PSA has risen from 1.6 in December to 2.6. Per Dr. Alen Blew he will continue to follow him with labs and visit in 12 weeks. If his PSA continues to rise he will need  Treatment in the future.  Dr. Alen Blew discussed treatment options with him and his wife and all questions were answered. I asked him to call with questions and concerns.  Treatment Phase - Follow-up -  Barriers/Navigation Needs No barriers at this time No barriers at this time;No Needs -  Interventions - - -  Support Groups/Services Prostate;Friends and Family Prostate Support Group;Friends and Family -  Acuity - - Level 1  Acuity Level 1 - - Minimal follow up required  Time Spent with Patient 15 15 15

## 2015-04-26 NOTE — Telephone Encounter (Signed)
per pof to sch pt appt-gave pt copy of avs °

## 2015-04-26 NOTE — Progress Notes (Signed)
Hematology and Oncology Follow Up Visit  Alexander Yang [id number] 1940/12/09 75 y.o. 04/26/2015 12:32 PM Alexander Yang, [name], Alexander E, MD   Principle Diagnosis: 75 year old gentleman with the prostate cancer diagnosed in 2009 he had a Gleason score 3+3 = 6 PSA of 3.17. He subsequently developed more aggressive cancer Gleason score 4+4 = 8, PSA 8.6 in 2011.   Prior Therapy: Observation and surveillance between 2009 in 2011. He was found to have a Gleason score 4+4 = 8 in 2011 and PSA of . He subsequently was evaluated the [facility] for proton therapy. He received radiation therapy between October 2011 until December 2011. He received concomitant Taxotere chemotherapy at a low dose but it was unclear to me whether he had it as a part of a clinical trial. He also received 6 months of hormone therapy up until July 2012. His PSA nadir was down to 0.1 In December 2013. His PSA continue to be low close to 0.1 and 0.2 Up till December 2015.  In June 2016 was up to 0.4 and his most recent PSA in September 2016 was up to 0.7.    Current therapy: Observation and surveillance for biochemical relapse prostate cancer.  Interim History:  Alexander Yang presents today for a follow-up visit. Since the last visit, he reports no recent complaints. He reports no new complaints such as dysuria or hematuria. No major changes in his performance status or activity level. Has not reported any back pain or pathological fractures. He continues to have soft bowel habits in the morning which subsides in the afternoon. He continues to have excellent quality of life and performance status.  He does not report any headaches, blurry vision, seizures. He denies any unsteadiness or syncope. Does not report any fevers, chills or sweats. Does not report any cough or hemoptysis. Desirable any nausea, vomiting or abdominal pain. Denies any frequency urgency or hesitancy. Remaining review of systems  unremarkable.  Medications: I have reviewed the patient's current medications.  Current Outpatient Prescriptions  Medication Sig Dispense Refill  . guaiFENesin-codeine (ROBITUSSIN AC) 100-10 MG/5ML syrup Take 5-10 mLs by mouth at bedtime as needed for cough. 180 mL 0  . losartan (COZAAR) 25 MG tablet TAKE ONE TABLET BY MOUTH ONCE DAILY 90 tablet 1  . pravastatin (PRAVACHOL) 40 MG tablet TAKE ONE TABLET BY MOUTH ONCE DAILY 90 tablet 3   No current facility-administered medications for this visit.     Allergies: No Known Allergies  Past Medical History, Surgical history, Social history, and Family History were reviewed and updated.  Exam: ECOG 0 Blood pressure 148/77, pulse 56, temperature 97.5 F (36.4 C), temperature source Oral, resp. rate 17, height 6\' 1"  (1.854 m), weight 198 lb (89.812 kg), SpO2 100 %.  General appearance: alert, a gentleman without distress. Head: Normocephalic, without obvious abnormality. No oral ulcers or lesions. Neck: no adenopathy Resp: clear to auscultation bilaterally no wheezes or rales to percussion.  Chest wall: no tenderness Cardio: regular rate and rhythm, S1, S2 normal, no murmur, click, rub or gallop Extremities: extremities normal, atraumatic, no cyanosis or edema Pulses: 2+ and symmetric Skin: Skin color, texture, turgor normal. No rashes or lesions Lymph nodes: Cervical, supraclavicular, and axillary nodes normal.   PSA on 04/20/2015 was 2.6.       Impression and Plan:   75 year old gentleman with the following issues:  1. Prostate cancer initially diagnosed in 2009 with a Gleason score 3+3 = 6 and a PSA of 3.17. He was treated with  a definitive hormonal therapy utilizing proton therapy at the [facility] in October 2011. He received a 6 months of hormone therapy that was completed in July 2012. His PSA nadir down to 0.1.   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 His last PSA was up to  2.6 on 06/18/2015.    Options of treatment were reviewed again today including local therapy and possible hormonal therapy. For the time being we have opt to continued observation surveillance and repeat PSA in about 12 weeks. His PSA continues to rise rapidly, he will need staging workup and possible hormonal therapy at that time.   2. Follow-up: Will be in May 2017 a week after he gets his PSA done at Alexander Leitz, MD 2/14/201712:32 PM

## 2015-04-28 ENCOUNTER — Encounter: Payer: Self-pay | Admitting: Oncology

## 2015-05-10 ENCOUNTER — Encounter: Payer: Self-pay | Admitting: *Deleted

## 2015-05-10 ENCOUNTER — Encounter: Payer: Self-pay | Admitting: Oncology

## 2015-05-10 NOTE — Progress Notes (Signed)
Patient requested letter from dr Alen Blew, re: prostate cancer for the [facility]. Letter done, patient notified and will p/u at the front.

## 2015-06-09 ENCOUNTER — Other Ambulatory Visit: Payer: Self-pay | Admitting: Family Medicine

## 2015-06-28 ENCOUNTER — Other Ambulatory Visit: Payer: Self-pay | Admitting: Family Medicine

## 2015-06-30 ENCOUNTER — Other Ambulatory Visit: Payer: Self-pay | Admitting: Family Medicine

## 2015-06-30 ENCOUNTER — Encounter: Payer: Self-pay | Admitting: Family Medicine

## 2015-06-30 MED ORDER — SILDENAFIL CITRATE 20 MG PO TABS: ORAL_TABLET | ORAL | Status: DC

## 2015-06-30 NOTE — Addendum Note (Signed)
Addended by: Carter Kitten on: 06/30/2015 12:19 PM   Modules accepted: Orders

## 2015-07-06 ENCOUNTER — Encounter: Payer: Self-pay | Admitting: Family Medicine

## 2015-07-09 ENCOUNTER — Encounter: Payer: Self-pay | Admitting: Oncology

## 2015-07-19 DIAGNOSIS — C61 Malignant neoplasm of prostate: Secondary | ICD-10-CM | POA: Diagnosis not present

## 2015-07-26 ENCOUNTER — Telehealth: Payer: Self-pay | Admitting: Oncology

## 2015-07-26 ENCOUNTER — Ambulatory Visit (HOSPITAL_BASED_OUTPATIENT_CLINIC_OR_DEPARTMENT_OTHER): Payer: Medicare Other | Admitting: Oncology

## 2015-07-26 ENCOUNTER — Encounter: Payer: Self-pay | Admitting: Medical Oncology

## 2015-07-26 VITALS — BP 139/62 | HR 69 | Temp 98.5°F | Resp 18 | Ht 73.0 in | Wt 191.3 lb

## 2015-07-26 DIAGNOSIS — C61 Malignant neoplasm of prostate: Secondary | ICD-10-CM

## 2015-07-26 NOTE — Progress Notes (Signed)
Hematology and Oncology Follow Up Visit  Alexander Yang [id number] [birth year] 75 y.o. 07/26/2015 1:39 PM [name], [name], [name], MD   Principle Diagnosis: 75 year old gentleman with the prostate cancer diagnosed in 2009 he had a Gleason score 3+3 = 6 PSA of 3.17. He subsequently developed more aggressive cancer Gleason score 4+4 = 8, PSA 8.6 in 2011.   Prior Therapy: Observation and surveillance between 2009 in 2011. He was found to have a Gleason score 4+4 = 8 in 2011 and PSA of . He subsequently was evaluated the [facility] for proton therapy. He received radiation therapy between October 2011 until December 2011. He received concomitant Taxotere chemotherapy at a low dose but it was unclear to me whether he had it as a part of a clinical trial. He also received 6 months of hormone therapy up until July 2012. His PSA nadir was down to 0.1 In December 2013. His PSA continue to be low close to 0.1 and 0.2 Up till December 2015.  In June 2016 was up to 0.4 and his most recent PSA in September 2016 was up to 0.7.    Current therapy: Observation and surveillance for biochemical relapse prostate cancer.  Interim History:  Alexander Yang presents today for a follow-up visit. Since the last visit, he continues to Yang well and remains asymptomatic. He reports no new complaints such as dysuria or hematuria. He does not report any arthralgias or myalgias. Has not reported any pathological fractures. No major changes in his performance status or activity level. He continues to have excellent quality of life and performance status. His quality of life remains excellent.  He does not report any headaches, blurry vision, seizures. He denies any unsteadiness or syncope. Does not report any fevers, chills or sweats. Does not report any cough or hemoptysis. Does not report any chest pain, palpitation, orthopnea or leg edema. He denied any nausea, vomiting or abdominal pain. Denies any frequency  urgency or hesitancy. Remaining review of systems unremarkable.  Medications: I have reviewed the patient's current medications.  Current Outpatient Prescriptions  Medication Sig Dispense Refill  . Broccoli Extract 500 MG TABS Take 300 mg by mouth.    . Cholecalciferol (VITAMIN D3) 2000 units capsule Take by mouth.    Nyoka Cowden Tea, Camillia sinensis, ([employer] SUPER GREEN TEA EXTRACT PO) Take 60 mg by mouth.    . losartan (COZAAR) 25 MG tablet TAKE ONE TABLET BY MOUTH ONCE DAILY 90 tablet 0  . pravastatin (PRAVACHOL) 40 MG tablet TAKE ONE TABLET BY MOUTH ONCE DAILY 90 tablet 0  . sildenafil (REVATIO) 20 MG tablet Take 2 to 5 tablets by mouth 30 minutes prior to intercourse 30 tablet 2  . TURMERIC PO     . UNABLE TO FIND Take 300 mg by mouth. pomegranite     No current facility-administered medications for this visit.     Allergies: No Known Allergies  Past Medical History, Surgical history, Social history, and Family History were reviewed and updated.  Exam: ECOG 0 Blood pressure 139/62, pulse 69, temperature 98.5 F (36.9 C), temperature source Oral, resp. rate 18, height 6\' 1"  (1.854 m), weight 191 lb 4.8 oz (86.773 kg), SpO2 99 %.  General appearance: Well-appearing gentleman without distress. Head: Normocephalic, without obvious abnormality. No oral ulcers or lesions. Neck: no adenopathy Resp: clear to auscultation bilaterally no wheezes or rales to percussion.  Chest wall: no tenderness Cardio: regular rate and rhythm, S1, S2 normal, no murmur, click, rub or gallop  Abdomen: Soft, nontender without rebound or guarding. No hepatosplenomegaly palpated. Extremities: extremities normal, atraumatic, no cyanosis or edema Pulses: 2+ and symmetric Skin: Skin color, texture, turgor normal. No rashes or lesions Lymph nodes: Cervical, supraclavicular, and axillary nodes normal.   PSA on 04/20/2015 was 2.6.  PSA on 07/19/2015 was 3.7.     Impression and Plan:   75 year old gentleman  with the following issues:  1. Prostate cancer initially diagnosed in 2009 with a Gleason score 3+3 = 6 and a PSA of 3.17. He was treated with a definitive hormonal therapy utilizing proton therapy at the [facility] in October 2011. He received a 6 months of hormone therapy that was completed in July 2012. His PSA nadir down to 0.1.   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 His last PSA on 07/19/2015 was slightly elevated to 3.7.   Options of treatment were reviewed again today including local therapy and possible hormonal therapy. He is enjoying excellent quality of life at this time and would like to preserve that is much as possible. For the time being we have opt to continued observation surveillance and repeat PSA in 3 months. If his PSA reaches a doubling time of less than 6 months, we will repeat imaging studies including CT scan and a bone scan and consider salvage therapy at that time.  2. Follow-up: Will be in August of 2017 a week after he gets his PSA done at Gerald Leitz, MD 5/16/20171:39 PM

## 2015-07-26 NOTE — Telephone Encounter (Signed)
per pfo to sch pt appt-gave pt copy of avs °

## 2015-08-16 LAB — HM DIABETES EYE EXAM

## 2015-08-17 ENCOUNTER — Encounter: Payer: Self-pay | Admitting: Family Medicine

## 2015-08-19 LAB — HM DIABETES EYE EXAM

## 2015-08-23 ENCOUNTER — Encounter: Payer: Self-pay | Admitting: Family Medicine

## 2015-08-23 ENCOUNTER — Telehealth: Payer: Self-pay | Admitting: Family Medicine

## 2015-08-23 DIAGNOSIS — Z7689 Persons encountering health services in other specified circumstances: Secondary | ICD-10-CM

## 2015-08-23 DIAGNOSIS — E1142 Type 2 diabetes mellitus with diabetic polyneuropathy: Secondary | ICD-10-CM | POA: Insufficient documentation

## 2015-08-23 NOTE — Telephone Encounter (Signed)
Pt dropped for department [employer] veterans [employer] form to be filled out In dr [name] in box

## 2015-08-24 NOTE — Telephone Encounter (Signed)
Forms completed by Dr. [name].  Mr. Bemiss notified forms are ready to be picked up at the front desk.  Mr. Eberts also notified that his heel pain in the last 3 months sounds more like plantar fasciitis than neuropathy.  Advised to try stretching feet and ice.  If not better, make an appointment to evaluate and treat further per Dr. [name].  Also advised that the tingling in his feet could be neuropathy from elevated sugars at he thought.  Mr. Tashjian is already scheduled to see Dr. [name] on 09/16/2015 for her Annual Wellness Exam.

## 2015-09-06 ENCOUNTER — Telehealth: Payer: Self-pay | Admitting: Family Medicine

## 2015-09-06 ENCOUNTER — Other Ambulatory Visit (INDEPENDENT_AMBULATORY_CARE_PROVIDER_SITE_OTHER): Payer: Medicare Other

## 2015-09-06 ENCOUNTER — Ambulatory Visit (INDEPENDENT_AMBULATORY_CARE_PROVIDER_SITE_OTHER): Payer: Medicare Other

## 2015-09-06 VITALS — BP 160/82 | HR 55 | Temp 98.7°F | Ht 71.25 in | Wt 186.5 lb

## 2015-09-06 DIAGNOSIS — E1140 Type 2 diabetes mellitus with diabetic neuropathy, unspecified: Secondary | ICD-10-CM

## 2015-09-06 DIAGNOSIS — Z0000 Encounter for general adult medical examination without abnormal findings: Secondary | ICD-10-CM

## 2015-09-06 LAB — LIPID PANEL
CHOL/HDL RATIO: 3
CHOLESTEROL: 152 mg/dL (ref 0–200)
HDL: 43.5 mg/dL (ref >39.00)
LDL Cholesterol: 95 mg/dL (ref 0–99)
NonHDL: 108.59
TRIGLYCERIDES: 66 mg/dL (ref 0.0–149.0)
VLDL: 13.2 mg/dL (ref 0.0–40.0)

## 2015-09-06 LAB — COMPREHENSIVE METABOLIC PANEL
ALBUMIN: 3.9 g/dL (ref 3.5–5.2)
ALK PHOS: 41 U/L (ref 39–117)
ALT: 17 U/L (ref 0–53)
AST: 15 U/L (ref 0–37)
BILIRUBIN TOTAL: 0.7 mg/dL (ref 0.2–1.2)
BUN: 15 mg/dL (ref 6–23)
CALCIUM: 9.4 mg/dL (ref 8.4–10.5)
CO2: 32 mEq/L (ref 19–32)
CREATININE: 0.84 mg/dL (ref 0.40–1.50)
Chloride: 106 mEq/L (ref 96–112)
GFR: 94.79 mL/min (ref >60.00)
Glucose, Bld: 167 mg/dL — ABNORMAL HIGH (ref 70–99)
Potassium: 4.2 mEq/L (ref 3.5–5.1)
Sodium: 139 mEq/L (ref 135–145)
TOTAL PROTEIN: 6.7 g/dL (ref 6.0–8.3)

## 2015-09-06 LAB — HEMOGLOBIN A1C: Hgb A1c MFr Bld: 7.2 % — ABNORMAL HIGH (ref 4.6–6.5)

## 2015-09-06 NOTE — Progress Notes (Signed)
PCP notes  Health maintenance:  A1C - completed today Foot exam - postponed until CPE Colonoscopy - will discuss with PCP at CPE  Abnormal screenings:   Hearing - failed  Patient concerns: None  Nurse concerns: Pt's BP was 160/82. Pt stated he takes BP medication at bedtime. Last dose taken 09/05/15 @ 11PM. Pt was encouraged to take and record AM BP readings starting tomorrow until date of CPE. Pt was asked to share these readings with PCP.   Next PCP appt: 09/16/15 @ 0830

## 2015-09-06 NOTE — Patient Instructions (Addendum)
Alexander Yang , Thank you for taking time to come for your Medicare Wellness Visit. I appreciate your ongoing commitment to your health goals. Please review the following plan we discussed and let me know if I can assist you in the future.   These are the goals we discussed: Goals    . Increase physical activity     Starting 09/06/2015, I will continue to exercise for at least 60 min 3-4 days per week.        This is a list of the screening recommended for you and due dates:  Health Maintenance  Topic Date Due  . Complete foot exam   09/16/2015*  . Colon Cancer Screening  09/16/2015*  . Flu Shot  10/11/2015  . Hemoglobin A1C  03/07/2016  . Eye exam for diabetics  08/15/2016  . Tetanus Vaccine  03/25/2017  . Shingles Vaccine  Completed  . Pneumonia vaccines  Completed  *Topic was postponed. The date shown is not the original due date.    Preventive Care for Adults  A healthy lifestyle and preventive care can promote health and wellness. Preventive health guidelines for adults include the following key practices.  . A routine yearly physical is a good way to check with your health care provider about your health and preventive screening. It is a chance to share any concerns and updates on your health and to receive a thorough exam.  . Visit your dentist for a routine exam and preventive care every 6 months. Brush your teeth twice a day and floss once a day. Good oral hygiene prevents tooth decay and gum disease.  . The frequency of eye exams is based on your age, health, family medical history, use  of contact lenses, and other factors. Follow your health care provider's ecommendations for frequency of eye exams.  . Eat a healthy diet. Foods like vegetables, fruits, whole grains, low-fat dairy products, and lean protein foods contain the nutrients you need without too many calories. Decrease your intake of foods high in solid fats, added sugars, and salt. Eat the right amount of  calories for you. Get information about a proper diet from your health care provider, if necessary.  . Regular physical exercise is one of the most important things you can do for your health. Most adults should get at least 150 minutes of moderate-intensity exercise (any activity that increases your heart rate and causes you to sweat) each week. In addition, most adults need muscle-strengthening exercises on 2 or more days a week.  Silver Sneakers may be a benefit available to you. To determine eligibility, you may visit the website: www.silversneakers.com or contact program at (515)697-7381 Mon-Fri between 8AM-8PM.   . Maintain a healthy weight. The body mass index (BMI) is a screening tool to identify possible weight problems. It provides an estimate of body fat based on height and weight. Your health care provider can find your BMI and can help you achieve or maintain a healthy weight.   For adults 20 years and older: ? A BMI below 18.5 is considered underweight. ? A BMI of 18.5 to 24.9 is normal. ? A BMI of 25 to 29.9 is considered overweight. ? A BMI of 30 and above is considered obese.   . Maintain normal blood lipids and cholesterol levels by exercising and minimizing your intake of saturated fat. Eat a balanced diet with plenty of fruit and vegetables. Blood tests for lipids and cholesterol should begin at age 23 and be repeated every  5 years. If your lipid or cholesterol levels are high, you are over 50, or you are at high risk for heart disease, you may need your cholesterol levels checked more frequently. Ongoing high lipid and cholesterol levels should be treated with medicines if diet and exercise are not working.  . If you smoke, find out from your health care provider how to quit. If you do not use tobacco, please do not start.  . If you choose to drink alcohol, please do not consume more than 2 drinks per day. One drink is considered to be 12 ounces (355 mL) of beer, 5 ounces  (148 mL) of wine, or 1.5 ounces (44 mL) of liquor.  . If you are 14-3 years old, ask your health care provider if you should take aspirin to prevent strokes.  . Use sunscreen. Apply sunscreen liberally and repeatedly throughout the day. You should seek shade when your shadow is shorter than you. Protect yourself by wearing long sleeves, pants, a wide-brimmed hat, and sunglasses year round, whenever you are outdoors.  . Once a month, do a whole body skin exam, using a mirror to look at the skin on your back. Tell your health care provider of new moles, moles that have irregular borders, moles that are larger than a pencil eraser, or moles that have changed in shape or color.

## 2015-09-06 NOTE — Progress Notes (Signed)
I reviewed health advisor's note, was available for consultation, and agree with documentation and plan.  Shaquile Lutze, MD [facility]  

## 2015-09-06 NOTE — Progress Notes (Signed)
Subjective:   Alexander Yang is a 75 y.o. male who presents for Medicare Annual/Subsequent preventive examination.  Review of Systems:  N/A Cardiac Risk Factors include: advanced age (>52men, >11 women);diabetes mellitus;male gender;hypertension;dyslipidemia     Objective:    Vitals: BP 160/82 mmHg  Pulse 55  Temp(Src) 98.7 F (37.1 C) (Oral)  Ht 5' 11.25" (1.81 m)  Wt 186 lb 8 oz (84.596 kg)  BMI 25.82 kg/m2  SpO2 98%  Body mass index is 25.82 kg/(m^2).  Tobacco History  Smoking status  . Former Smoker -- 2.00 packs/day for 10 years  . Types: Cigarettes  . Quit date: 03/12/1965  Smokeless tobacco  . Never Used     Counseling given: No   Past Medical History  Diagnosis Date  . Osteoporosis   . Cerebrovascular accident ([place])   . Prostate cancer ([place])   . 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 Diabetes mellitus without complication ([place])     controlled with diet  . Hypertension    Past Surgical History  Procedure Laterality Date  . Tonsillectomy  03/13/1947  . Acl tear  03/12/1978  . Prostate biopsy    . Vasectomy     Family History  Problem Relation Age of Onset  . Cerebral aneurysm Mother   . Diabetes Father   . Bradycardia Father   . Benign prostatic hyperplasia Father   . Supraventricular tachycardia Brother   . Supraventricular tachycardia Brother    History  Sexual Activity  . Sexual Activity: No    Outpatient Encounter Prescriptions as of 09/06/2015  Medication Sig  . Broccoli Extract 500 MG TABS Take 300 mg by mouth.  . Cholecalciferol (VITAMIN D3) 2000 units capsule Take by mouth.  Nyoka Cowden Tea, Camillia sinensis, ([employer] SUPER GREEN TEA EXTRACT PO) Take 60 mg by mouth.  . losartan (COZAAR) 25 MG tablet TAKE ONE TABLET BY MOUTH ONCE DAILY  . pravastatin (PRAVACHOL) 40 MG tablet TAKE ONE  TABLET BY MOUTH ONCE DAILY  . sildenafil (REVATIO) 20 MG tablet Take 2 to 5 tablets by mouth 30 minutes prior to intercourse  . TURMERIC PO   . UNABLE TO FIND Take 300 mg by mouth. pomegranite   No facility-administered encounter medications on file as of 09/06/2015.    Activities of Daily Living In your present state of health, do you have any difficulty performing the following activities: 09/06/2015  Hearing? N  Vision? N  Difficulty concentrating or making decisions? N  Walking or climbing stairs? N  Dressing or bathing? N  Doing errands, shopping? N  Preparing Food and eating ? N  Using the Toilet? N  In the past six months, have you accidently leaked urine? N  Do you have problems with loss of bowel control? N  Managing your Medications? N  Managing your Finances? N  Housekeeping or managing your Housekeeping? N    Patient Care Team: Jinny Sanders, MD as PCP - General Wyatt Portela, MD as Consulting Physician (Oncology) Katherine Mantle, OD as Referring Physician (Optometry)   Assessment:     Hearing Screening   125Hz  250Hz  500Hz  1000Hz  2000Hz  4000Hz  8000Hz   Right ear:   40 40 40 0   Left ear:   40 40 40 0   Vision Screening Comments: Last vision exam with Dr. Sherran Needs in June 2017   Exercise Activities  and Dietary recommendations Current Exercise Habits: Structured exercise class, Type of exercise: strength training/weights;Other - see comments (cardio), Time (Minutes): 60, Frequency (Times/Week): 4, Weekly Exercise (Minutes/Week): 240, Intensity: Moderate, Exercise limited by: None identified  Goals    . Increase physical activity     Starting 09/06/2015, I will continue to exercise for at least 60 min 3-4 days per week.       Fall Risk Fall Risk  09/06/2015 12/24/2014 08/31/2014 08/11/2013  Falls in the past year? No No No No   Depression Screen PHQ 2/9 Scores 09/06/2015 12/24/2014 08/31/2014 08/11/2013  PHQ - 2 Score 0 0 0 0    Cognitive Testing MMSE - Mini  Mental State Exam 09/06/2015  Orientation to time 5  Orientation to Place 5  Registration 3  Attention/ Calculation 0  Recall 3  Language- name 2 objects 0  Language- repeat 1  Language- follow 3 step command 3  Language- read & follow direction 0  Write a sentence 0  Copy design 0  Total score 20   PLEASE NOTE: A Mini-Cog screen was completed. Maximum score is 20. A value of 0 denotes this part of Folstein MMSE was not completed or the patient failed this part of the Mini-Cog screening.   Mini-Cog Screening Orientation to Time - Max 5 pts Orientation to Place - Max 5 pts Registration - Max 3 pts Recall - Max 3 pts Language Repeat - Max 1 pts Language Follow 3 Step Command - Max 3 pts  Immunization History  Administered Date(s) Administered  . Influenza Whole 01/10/2007, 12/09/2007, 12/14/2008  . Influenza, High Dose Seasonal PF 12/11/2013, 12/17/2014  . Influenza-Unspecified 12/10/2012  . Pneumococcal Conjugate-13 08/11/2013  . Pneumococcal Polysaccharide-23 03/26/2007  . Td 03/26/2007  . Zoster 12/03/2007   Screening Tests Health Maintenance  Topic Date Due  . FOOT EXAM  09/16/2015 (Originally 08/31/2015)  . COLONOSCOPY  09/16/2015 (Originally 01/16/2014)  . INFLUENZA VACCINE  10/11/2015  . HEMOGLOBIN A1C  03/07/2016  . OPHTHALMOLOGY EXAM  08/15/2016  . TETANUS/TDAP  03/25/2017  . ZOSTAVAX  Completed  . PNA vac Low Risk Adult  Completed      Plan:     I have personally reviewed and addressed the Medicare Annual Wellness questionnaire and have noted the following in the patient's chart:  A. Medical and social history B. Use of alcohol, tobacco or illicit drugs  C. Current medications and supplements D. Functional ability and status E.  Nutritional status F.  Physical activity G. Advance directives H. List of other physicians I.  Hospitalizations, surgeries, and ER visits in previous 12 months J.  [place] to include hearing, vision, cognitive,  depression L. Referrals and appointments - none  In addition, I have reviewed and discussed with patient certain preventive protocols, quality metrics, and best practice recommendations. A written personalized care plan for preventive services as well as general preventive health recommendations were provided to patient.  See attached scanned questionnaire for additional information.   Signed,   Lindell Noe, MHA, BS, LPN Health Advisor

## 2015-09-06 NOTE — Progress Notes (Signed)
Pre visit review using our clinic review tool, if applicable. No additional management support is needed unless otherwise documented below in the visit note. 

## 2015-09-06 NOTE — Telephone Encounter (Signed)
-----   Message from Ellamae Sia sent at 08/30/2015  2:29 PM EDT ----- Regarding: Lab orders for Tuesday, 6.27.17 Lab orders for a f/u appt

## 2015-09-08 ENCOUNTER — Encounter: Payer: Self-pay | Admitting: Family Medicine

## 2015-09-16 ENCOUNTER — Ambulatory Visit (INDEPENDENT_AMBULATORY_CARE_PROVIDER_SITE_OTHER): Payer: Medicare Other | Admitting: Family Medicine

## 2015-09-16 ENCOUNTER — Encounter: Payer: Self-pay | Admitting: Family Medicine

## 2015-09-16 VITALS — BP 120/72 | HR 67 | Temp 97.6°F | Ht 71.25 in | Wt 187.5 lb

## 2015-09-16 DIAGNOSIS — E1140 Type 2 diabetes mellitus with diabetic neuropathy, unspecified: Secondary | ICD-10-CM | POA: Diagnosis not present

## 2015-09-16 DIAGNOSIS — I10 Essential (primary) hypertension: Secondary | ICD-10-CM | POA: Diagnosis not present

## 2015-09-16 DIAGNOSIS — E1142 Type 2 diabetes mellitus with diabetic polyneuropathy: Secondary | ICD-10-CM

## 2015-09-16 DIAGNOSIS — L219 Seborrheic dermatitis, unspecified: Secondary | ICD-10-CM

## 2015-09-16 DIAGNOSIS — E7800 Pure hypercholesterolemia, unspecified: Secondary | ICD-10-CM | POA: Diagnosis not present

## 2015-09-16 LAB — HM DIABETES FOOT EXAM

## 2015-09-16 MED ORDER — KETOCONAZOLE 2 % EX CREA: 1.0000 "application " | TOPICAL_CREAM | Freq: Every day | CUTANEOUS | Status: DC

## 2015-09-16 NOTE — Assessment & Plan Note (Signed)
Mild.. No to severe enough to treat.

## 2015-09-16 NOTE — Assessment & Plan Note (Signed)
Well controlled. Continue current medication. Encouraged exercise, weight loss, healthy eating habits.  

## 2015-09-16 NOTE — Assessment & Plan Note (Signed)
Well controlled. Continue current medication.  

## 2015-09-16 NOTE — Progress Notes (Signed)
Subjective:    Patient ID: Alexander Yang, male    DOB: Jun 22, 1940, 75 y.o.   MRN: [id number]  HPI   75 year old male presents for Part 2 of AMW. Earlier  she saw Candis Musa, LPN for medicare wellness. Note reviewed in detail.  Diabetes:  Inadequate control on no medication. Lab Results  Component Value Date   HGBA1C 7.2* 09/06/2015  Using medications without difficulties: Hypoglycemic episodes: Hyperglycemic episodes: Feet problems: None, Has tingling in hands and feet. Blood Sugars averaging: not checking eye exam within last year:  Hypertension:   Good control on losartan. BP Readings from Last 3 Encounters:  09/16/15 120/72  09/06/15 160/82  07/26/15 139/62  At home has been well controlled.  Using medication without problems or lightheadedness:  None Chest pain with exertion: None  Edema:None Short of breath: None Average home BPs: 120/70 Other issues:  Wt Readings from Last 3 Encounters:  09/16/15 187 lb 8 oz (85.049 kg)  09/06/15 186 lb 8 oz (84.596 kg)  07/26/15 191 lb 4.8 oz (86.773 kg)    Elevated Cholesterol:  LDL at goal < 70 (hx of CVA) on pravastatin. Lab Results  Component Value Date   CHOL 152 09/06/2015   HDL 43.50 09/06/2015   LDLCALC 95 09/06/2015   TRIG 66.0 09/06/2015   CHOLHDL 3 09/06/2015  Using medications without problems: None Muscle aches: None Diet compliance: Has been working on portion size. He has not been eating low carbs though. Exercise:  3-4 days weekly Other complaints:  Prostate cancer was in remission: Seeing ONC. Lab Results  Component Value Date   PSA 4.48* 03/29/2008   PSA 3.30 06/23/2007   PSA 2.62 08/23/2006     Social History /Family History/Past Medical History reviewed and updated if needed.   Review of Systems  Constitutional: Negative for fever and fatigue.  HENT: Negative for ear pain.   Eyes: Negative for pain.  Respiratory: Negative for cough and shortness of breath.   Cardiovascular:  Negative for chest pain, palpitations and leg swelling.  Gastrointestinal: Negative for abdominal pain.  Genitourinary: Negative for dysuria.  Musculoskeletal: Negative for arthralgias.  Neurological: Negative for syncope, light-headedness and headaches.  Psychiatric/Behavioral: Negative for dysphoric mood.       Objective:   Physical Exam  Constitutional: Vital signs are normal. He appears well-developed and well-nourished.  HENT:  Head: Normocephalic.  Right Ear: Hearing normal.  Left Ear: Hearing normal.  Nose: Nose normal.  Mouth/Throat: Oropharynx is clear and moist and mucous membranes are normal.  Neck: Trachea normal. Carotid bruit is not present. No thyroid mass and no thyromegaly present.  Cardiovascular: Normal rate, regular rhythm and normal pulses.  Exam reveals no gallop, no distant heart sounds and no friction rub.   No murmur heard. No peripheral edema  Pulmonary/Chest: Effort normal and breath sounds normal. No respiratory distress.  Skin: Skin is warm, dry and intact. Rash noted.  Dry flaky waxy skin around nose and ears, chin and scalp  Psychiatric: He has a normal mood and affect. His speech is normal and behavior is normal. Thought content normal.    Diabetic foot exam: Normal inspection No skin breakdown No calluses  Normal DP pulses Normal sensation to light touch and monofilament Nails normal       Assessment & Plan:  The patient's preventative maintenance and recommended screening tests for an annual wellness exam were reviewed in full today. Brought up to date unless services declined.  Counselled on the importance of  diet, exercise, and its role in overall health and mortality. The patient's FH and SH was reviewed, including their home life, tobacco status, and drug and alcohol status.   Colon: Hx of polyps:02/2011 in [place] , one polyp benign path. Repeat in 10 year. 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 Completed trelstar.  Followed by onc now. Former smoker Quit 49 years ago. No SOB, no daily cough. No fall in last year.  Negative depression screen. Hearing: failed, but pt noted only decreased hearing with watching TV, mild. Has tinnitus.Marland Kitchen

## 2015-09-16 NOTE — Patient Instructions (Addendum)
Decrease bread and carbohydrates in diet.  Keep up with exercise.

## 2015-09-16 NOTE — Assessment & Plan Note (Signed)
refilld ketoconazole cream.

## 2015-09-16 NOTE — Assessment & Plan Note (Addendum)
Work on low carb die, increase exercis enad weight loss. Follow up on A1C in 3-6 months. Info given.

## 2015-09-16 NOTE — Progress Notes (Signed)
Pre visit review using our clinic review tool, if applicable. No additional management support is needed unless otherwise documented below in the visit note. 

## 2015-10-11 ENCOUNTER — Other Ambulatory Visit: Payer: Self-pay | Admitting: Family Medicine

## 2015-10-17 DIAGNOSIS — C61 Malignant neoplasm of prostate: Secondary | ICD-10-CM | POA: Diagnosis not present

## 2015-10-26 ENCOUNTER — Ambulatory Visit (HOSPITAL_BASED_OUTPATIENT_CLINIC_OR_DEPARTMENT_OTHER): Payer: Medicare Other | Admitting: Oncology

## 2015-10-26 ENCOUNTER — Encounter: Payer: Self-pay | Admitting: Medical Oncology

## 2015-10-26 VITALS — BP 154/74 | HR 76 | Temp 98.3°F | Resp 18 | Ht 71.25 in | Wt 192.1 lb

## 2015-10-26 DIAGNOSIS — C61 Malignant neoplasm of prostate: Secondary | ICD-10-CM

## 2015-10-26 NOTE — Progress Notes (Signed)
Hematology and Oncology Follow Up Visit  Alexander Yang [id number] March 08, 1941 75 y.o. 10/26/2015 1:55 PM Alexander Yang, [name], Alexander E, MD   Principle Diagnosis: 75 year old gentleman with the prostate cancer diagnosed in 2009 he had a Gleason score 3+3 = 6 PSA of 3.17. He subsequently developed more aggressive cancer Gleason score 4+4 = 8, PSA 8.6 in 2011.   Prior Therapy: Observation and surveillance between 2009 in 2011. He was found to have a Gleason score 4+4 = 8 in 2011 and PSA of . He subsequently was evaluated the [facility] for proton therapy. He received radiation therapy between October 2011 until December 2011. He received concomitant Taxotere chemotherapy at a low dose but it was unclear to me whether he had it as a part of a clinical trial. He also received 6 months of hormone therapy up until July 2012. His PSA nadir was down to 0.1 In December 2013. His PSA continue to be low close to 0.1 and 0.2 Up till December 2015.  In June 2016 was up to 0.4 and his most recent PSA in September 2016 was up to 0.7.    Current therapy: Observation and surveillance for biochemical relapse prostate cancer.  Interim History:  [name] presents today for a follow-up visit. Since the last visit, he reports no changes in his health. He remains in good health with excellent performance status and quality of life. He reports no new complaints such as dysuria or hematuria. He does not report any arthralgias or myalgias. Has not reported any pathological fractures. No major changes in his performance status or activity level. He reports some occasional diarrhea in the morning which subsides in the afternoon.  He does not report any headaches, blurry vision, seizures. He denies any unsteadiness or syncope. Does not report any fevers, chills or sweats. Does not report any cough or hemoptysis. Does not report any chest pain, palpitation, orthopnea or leg edema. He denied any nausea,  vomiting or abdominal pain. Denies any frequency urgency or hesitancy. Remaining review of systems unremarkable.  Medications: I have reviewed the patient's current medications.  Current Outpatient Prescriptions  Medication Sig Dispense Refill  . Broccoli Extract 500 MG TABS Take 300 mg by mouth.    . Cholecalciferol (VITAMIN D3) 2000 units capsule Take by mouth.    Nyoka Cowden Tea, Camillia sinensis, ([facility] SUPER GREEN TEA EXTRACT PO) Take 60 mg by mouth.    Marland Kitchen ketoconazole (NIZORAL) 2 % cream Apply 1 application topically daily. 15 g 0  . losartan (COZAAR) 25 MG tablet TAKE ONE TABLET BY MOUTH ONCE DAILY 90 tablet 1  . pravastatin (PRAVACHOL) 40 MG tablet TAKE ONE TABLET BY MOUTH ONCE DAILY 90 tablet 0  . sildenafil (REVATIO) 20 MG tablet Take 2 to 5 tablets by mouth 30 minutes prior to intercourse 30 tablet 2  . TURMERIC PO     . UNABLE TO FIND Take 300 mg by mouth. pomegranite     No current facility-administered medications for this visit.      Allergies: No Known Allergies  Past Medical History, Surgical history, Social history, and Family History were reviewed and updated.  Exam: ECOG 0 Blood pressure (!) 154/74, pulse 76, temperature 98.3 F (36.8 C), temperature source Oral, resp. rate 18, height 5' 11.25" (1.81 m), weight 192 lb 1.6 oz (87.1 kg), SpO2 99 %. ECOG 0.  General appearance: Alert, awake gentleman without distress. Head: Normocephalic, without obvious abnormality. No oral thrush noted. Neck: no adenopathy or thyroid masses.  Resp: clear to auscultation bilaterally no wheezes or rales to percussion.  Chest wall: no tenderness or masses. Cardio: regular rate and rhythm, S1, S2 normal, no murmur, click, rub or gallop Abdomen: Soft, nontender without rebound or guarding. No shifting dullness or ascites. Extremities: extremities normal, atraumatic, no cyanosis or edema Pulses: 2+ and symmetric Skin: Skin color, texture, turgor normal. No rashes or lesions Lymph nodes:  Cervical, supraclavicular, and axillary nodes normal.   PSA on 04/20/2015 was 2.6.  PSA on 07/19/2015 was 3.7. PSA on 10/18/2015 was 3.2.    Impression and Plan:   75 year old gentleman with the following issues:  1. Prostate cancer initially diagnosed in 2009 with a Gleason score 3+3 = 6 and a PSA of 3.17. He was treated with a definitive hormonal therapy utilizing proton therapy at the [facility] in October 2011. He received a 6 months of hormone therapy that was completed in July 2012. His PSA nadir down to 0.1.   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 His last PSA on 10/18/2015 have actually slightly decreased to 3.2. Overall this indicates disease stability without any need for starting androgen deprivation.   The plan is to continue with observation and surveillance and repeat PSA in 3 months.   2. Follow-up: Will be in November of 2017 a week after he gets his PSA done at Gerald Leitz, MD 8/16/20171:55 PM

## 2015-10-31 ENCOUNTER — Telehealth: Payer: Self-pay | Admitting: Oncology

## 2015-10-31 NOTE — Telephone Encounter (Signed)
Called patient to conf appt. L/M Appt ltr and schd mailed. 10/31/15 °

## 2015-11-17 ENCOUNTER — Encounter: Payer: Self-pay | Admitting: Family Medicine

## 2015-12-02 DIAGNOSIS — Z23 Encounter for immunization: Secondary | ICD-10-CM | POA: Diagnosis not present

## 2015-12-15 ENCOUNTER — Other Ambulatory Visit: Payer: Self-pay | Admitting: Family Medicine

## 2016-01-03 ENCOUNTER — Ambulatory Visit (INDEPENDENT_AMBULATORY_CARE_PROVIDER_SITE_OTHER): Payer: Medicare Other | Admitting: Podiatry

## 2016-01-03 ENCOUNTER — Encounter: Payer: Self-pay | Admitting: Podiatry

## 2016-01-03 VITALS — BP 156/90 | HR 60 | Resp 16

## 2016-01-03 DIAGNOSIS — M79671 Pain in right foot: Secondary | ICD-10-CM | POA: Diagnosis not present

## 2016-01-03 DIAGNOSIS — M79672 Pain in left foot: Secondary | ICD-10-CM

## 2016-01-03 DIAGNOSIS — M79673 Pain in unspecified foot: Secondary | ICD-10-CM | POA: Diagnosis not present

## 2016-01-03 DIAGNOSIS — M722 Plantar fascial fibromatosis: Secondary | ICD-10-CM

## 2016-01-03 NOTE — Progress Notes (Signed)
Subjective: Patient presents today for pain and tenderness in the feet bilaterally. Patient states the foot pain has been hurting for several weeks now. Patient states that it hurts in the mornings with the first steps out of bed. Patient presents today for further treatment and evaluation  Objective: Physical Exam General: The patient is alert and oriented x3 in no acute distress.  Dermatology: Skin is warm, dry and supple bilateral lower extremities. Negative for open lesions or macerations bilateral.   Vascular: Dorsalis Pedis and Posterior Tibial pulses palpable bilateral.  Capillary fill time is immediate to all digits.  Neurological: Epicritic and protective threshold intact bilateral.   Musculoskeletal: Tenderness to palpation at the medial calcaneal tubercale and through the insertion of the plantar fascia of the bilateral feet. All other joints range of motion within normal limits bilateral. Strength 5/5 in all groups bilateral.   Radiographic exam: Normal osseous mineralization. Joint spaces preserved. No fracture/dislocation/boney destruction. Calcaneal spur present with mild thickening of plantar fascia bilateral. No other soft tissue abnormalities or radiopaque foreign bodies.   Assessment: #1 plantar fasciitis bilateral feet #2 pain in bilateral feet  Plan of Care:  1. Patient evaluated. Xrays reviewed.   2. Instructed patient regarding therapies and modalities at home to alleviate symptoms.  4. Plantar fascial band(s) dispensed for bilateral plantar fasciitis. 7. Return to clinic in 4 weeks.

## 2016-01-11 ENCOUNTER — Encounter: Payer: Self-pay | Admitting: Family Medicine

## 2016-01-11 ENCOUNTER — Encounter: Payer: Self-pay | Admitting: Oncology

## 2016-01-11 DIAGNOSIS — C61 Malignant neoplasm of prostate: Secondary | ICD-10-CM | POA: Diagnosis not present

## 2016-01-27 ENCOUNTER — Telehealth: Payer: Self-pay | Admitting: Oncology

## 2016-01-27 ENCOUNTER — Ambulatory Visit (HOSPITAL_BASED_OUTPATIENT_CLINIC_OR_DEPARTMENT_OTHER): Payer: Medicare Other | Admitting: Oncology

## 2016-01-27 VITALS — BP 159/79 | HR 56 | Temp 97.9°F | Resp 17 | Ht 71.25 in | Wt 194.0 lb

## 2016-01-27 DIAGNOSIS — C61 Malignant neoplasm of prostate: Secondary | ICD-10-CM | POA: Diagnosis not present

## 2016-01-27 DIAGNOSIS — M791 Myalgia: Secondary | ICD-10-CM | POA: Diagnosis not present

## 2016-01-27 NOTE — Progress Notes (Signed)
Hematology and Oncology Follow Up Visit  Alexander Yang [id number] 10-23-1940 75 y.o. 01/27/2016 3:53 PM Alexander Yang, [name], Alexander E, MD   Principle Diagnosis: 75 year old gentleman with the prostate cancer diagnosed in 2009 he had a Gleason score 3+3 = 6 PSA of 3.17. He subsequently developed more aggressive cancer Gleason score 4+4 = 8, PSA 8.6 in 2011.   Prior Therapy: Observation and surveillance between 2009 in 2011. He was found to have a Gleason score 4+4 = 8 in 2011 and PSA of . He subsequently was evaluated the [facility] for proton therapy. He received radiation therapy between October 2011 until December 2011. He received concomitant Taxotere chemotherapy at a low dose but it was unclear to me whether he had it as a part of a clinical trial. He also received 6 months of hormone therapy up until July 2012. His PSA nadir was down to 0.1 In December 2013. His PSA continue to be low close to 0.1 and 0.2 Up till December 2015.  In June 2016 was up to 0.4 and his most recent PSA in September 2016 was up to 0.7.    Current therapy: Observation and surveillance for biochemical relapse prostate cancer.  Interim History:  Alexander Yang presents today for a follow-up visit. Since the last visit, he reports pain around his right thigh associated with a muscle pull. He noticed it after been exercising regularly but 3 times a week. He denied any falls or syncope and still ambulates without any difficulties. He denied any bone pain or neurological deficits. He remains in good health with excellent performance status and quality of life. He reports no new complaints such as dysuria or hematuria. He does not report any arthralgias or myalgias. No major changes in his performance status or activity level.  He does not report any headaches, blurry vision, seizures. He denies any unsteadiness or syncope. Does not report any fevers, chills or sweats. Does not report any cough or hemoptysis.  Does not report any chest pain, palpitation, orthopnea or leg edema. He denied any nausea, vomiting or abdominal pain. Denies any frequency urgency or hesitancy. Remaining review of systems unremarkable.  Medications: I have reviewed the patient's current medications.  Current Outpatient Prescriptions  Medication Sig Dispense Refill  . Broccoli Extract 500 MG TABS Take 300 mg by mouth.    . Cholecalciferol (VITAMIN D3) 2000 units capsule Take by mouth.    Nyoka Cowden Tea, Camillia sinensis, ([employer] SUPER GREEN TEA EXTRACT PO) Take 60 mg by mouth.    Marland Kitchen ketoconazole (NIZORAL) 2 % cream Apply 1 application topically daily. 15 g 0  . losartan (COZAAR) 25 MG tablet TAKE ONE TABLET BY MOUTH ONCE DAILY 90 tablet 1  . pravastatin (PRAVACHOL) 40 MG tablet TAKE ONE TABLET BY MOUTH ONCE DAILY 90 tablet 2  . sildenafil (REVATIO) 20 MG tablet Take 2 to 5 tablets by mouth 30 minutes prior to intercourse 30 tablet 2  . TURMERIC PO     . UNABLE TO FIND Take 300 mg by mouth. pomegranite     No current facility-administered medications for this visit.      Allergies: No Known Allergies  Past Medical History, Surgical history, Social history, and Family History were reviewed and updated.  Exam: ECOG 0 Blood pressure (!) 159/79, pulse (!) 56, temperature 97.9 F (36.6 C), temperature source Oral, resp. rate 17, height 5' 11.25" (1.81 m), weight 194 lb (88 kg), SpO2 100 %. ECOG 0.  General appearance: Well-appearing gentleman without  distress. Head: Normocephalic, without obvious abnormality. No oral ulcers or lesions. Neck: no adenopathy or thyroid masses. Resp: clear to auscultation bilaterally no wheezes or rales to percussion.  Chest wall: no tenderness or masses. Cardio: regular rate and rhythm, S1, S2 normal, no murmur, click, rub or gallop Abdomen: Soft, nontender without rebound or guarding. No rebound or guarding. Extremities: extremities normal, atraumatic, no cyanosis or edema Pulses: 2+ and  symmetric Skin: Skin color, texture, turgor normal. No rashes or lesions Lymph nodes: Cervical, supraclavicular, and axillary nodes normal.   PSA on 04/20/2015 was 2.6.  PSA on 07/19/2015 was 3.7. PSA on 10/18/2015 was 3.2. PSA on 01/11/2016 was 4.4.   Impression and Plan:   75 year old gentleman with the following issues:  1. Prostate cancer initially diagnosed in 2009 with a Gleason score 3+3 = 6 and a PSA of 3.17. He was treated with a definitive hormonal therapy utilizing proton therapy at the [facility] in October 2011. He received a 6 months of hormone therapy that was completed in July 2012. His PSA nadir down to 0.1.   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 His last PSA on 01/11/2016 was 4.4 and he is asymptomatic. Risks and benefits of proceeding with hormone therapy was reviewed and he opted to continue with observation and surveillance. The plan is to repeat his PSA in 3 months.   2. Muscle pain: Appears to be related to excessive exercise we have discussed different strategies to avoid that in the future including appropriate stretching. I do not believe his pain is related to bone metastasis   2. Follow-up: Will be in February 2018 after repeating his PSA.   Zola Button, MD 11/17/20173:53 PM

## 2016-01-27 NOTE — Telephone Encounter (Signed)
Appointments scheduled per 01/27/16 los. AVS report and appointment schedule given to patient per 01/27/16 los.

## 2016-01-30 ENCOUNTER — Encounter: Payer: Self-pay | Admitting: Oncology

## 2016-02-07 ENCOUNTER — Encounter: Payer: Self-pay | Admitting: Podiatry

## 2016-02-07 ENCOUNTER — Ambulatory Visit (INDEPENDENT_AMBULATORY_CARE_PROVIDER_SITE_OTHER): Payer: Medicare Other | Admitting: Podiatry

## 2016-02-07 DIAGNOSIS — M79673 Pain in unspecified foot: Secondary | ICD-10-CM | POA: Diagnosis not present

## 2016-02-07 DIAGNOSIS — M79672 Pain in left foot: Secondary | ICD-10-CM

## 2016-02-07 DIAGNOSIS — M79671 Pain in right foot: Secondary | ICD-10-CM | POA: Diagnosis not present

## 2016-02-07 DIAGNOSIS — M722 Plantar fascial fibromatosis: Secondary | ICD-10-CM

## 2016-02-08 NOTE — Progress Notes (Signed)
Subjective: Patient presents today for pain and tenderness in the feet bilaterally. Patient states the foot pain has been hurting for several weeks now. Patient states that it hurts in the mornings with the first steps out of bed. Patient presents today for further treatment and evaluation Patient states that he feels a little bit better but he continues to be sore on the inside of his left heel. Patient is not sure if the braces are helping.  Objective: Physical Exam General: The patient is alert and oriented x3 in no acute distress.  Dermatology: Skin is warm, dry and supple bilateral lower extremities. Negative for open lesions or macerations bilateral.   Vascular: Dorsalis Pedis and Posterior Tibial pulses palpable bilateral.  Capillary fill time is immediate to all digits.  Neurological: Epicritic and protective threshold intact bilateral.   Musculoskeletal: Tenderness to palpation at the medial calcaneal tubercale and through the insertion of the plantar fascia of the bilateral feet. All other joints range of motion within normal limits bilateral. Strength 5/5 in all groups bilateral.   Radiographic exam: Normal osseous mineralization. Joint spaces preserved. No fracture/dislocation/boney destruction. Calcaneal spur present with mild thickening of plantar fascia bilateral. No other soft tissue abnormalities or radiopaque foreign bodies.   Assessment: #1 plantar fasciitis bilateral feet #2 pain in bilateral feet  Plan of Care:  1. Patient evaluated. Xrays reviewed.   2. Continue conservative modalities at home to alleviate symptoms. 3. Patient refuses injection today. 4. Continue plantar fascial bands orthotics to the patient artery has.  5. Recommend [employer] for new shoes 6. Return to clinic as needed

## 2016-02-13 ENCOUNTER — Encounter: Payer: Self-pay | Admitting: Family Medicine

## 2016-02-15 ENCOUNTER — Encounter: Payer: Self-pay | Admitting: Family Medicine

## 2016-02-15 ENCOUNTER — Ambulatory Visit (INDEPENDENT_AMBULATORY_CARE_PROVIDER_SITE_OTHER): Payer: Medicare Other | Admitting: Family Medicine

## 2016-02-15 DIAGNOSIS — M7918 Myalgia, other site: Secondary | ICD-10-CM

## 2016-02-15 DIAGNOSIS — M791 Myalgia: Secondary | ICD-10-CM

## 2016-02-15 MED ORDER — PREDNISONE 20 MG PO TABS: ORAL_TABLET | ORAL | 0 refills | Status: DC

## 2016-02-15 NOTE — Progress Notes (Signed)
Pre visit review using our clinic review tool, if applicable. No additional management support is needed unless otherwise documented below in the visit note. 

## 2016-02-15 NOTE — Patient Instructions (Signed)
Complete prednisone taper.  Start home physical therapy.  Heat on low back.  Follow up in 2 weeks if pain not improviung for further eval.

## 2016-02-15 NOTE — Assessment & Plan Note (Signed)
Sciatica ( ttp at sciatic notch and sharp shooting pain back to buttiock but not to leg) versus piriformis syndrome ( positive facer). Treat with pred taper and start home PT. Consider X-ray low back if not improvig in 2 weeks.. No indications for imaging at this time.

## 2016-02-15 NOTE — Progress Notes (Signed)
   Subjective:    Patient ID: Alexander Yang, male    DOB: [birth year], 75 y.o.   MRN: [id number]  HPI    75 year old male  Hx of prostate cancer presents for new onset pain in hip. He reports  right posterior hip pain starting in  Last  2-3 weeks ago.  Pain is intermittent,not constant, worse after up on feet during the day. Usually 1-2/10 on pain scale constant ache.  If he leans over he has sharp pain in right lower back, radiates to right buttock. Pain 10/10  Leaning back or forward hurts more. No anterior hip pain, no radiation of pain to leg.  No weakness, no numbness in leg.   No proceeding fall, no change in activity or injury. No fever, no urinary changes. No groin numbness  Using ice packs helps a little short term. Tried advil 400 mg and aleve.Marland Kitchen Helps temporarily somewhat   Hx of back issues, not chronic   Review of Systems  Constitutional: Negative for fatigue.  HENT: Negative for ear pain.   Eyes: Negative for pain.  Respiratory: Negative for shortness of breath.   Cardiovascular: Negative for chest pain.  Gastrointestinal: Negative for abdominal distention.       Objective:   Physical Exam  Constitutional: Vital signs are normal. He appears well-developed and well-nourished.  HENT:  Head: Normocephalic.  Right Ear: Hearing normal.  Left Ear: Hearing normal.  Nose: Nose normal.  Mouth/Throat: Oropharynx is clear and moist and mucous membranes are normal.  Neck: Trachea normal. Carotid bruit is not present. No thyroid mass and no thyromegaly present.  Cardiovascular: Normal rate, regular rhythm and normal pulses.  Exam reveals no gallop, no distant heart sounds and no friction rub.   No murmur heard. No peripheral edema  Pulmonary/Chest: Effort normal and breath sounds normal. No respiratory distress.  Musculoskeletal:       Right hip: Normal. He exhibits normal range of motion and no tenderness.       Lumbar back: He exhibits tenderness. He exhibits  normal range of motion and no bony tenderness.  ttp at right sciatic notch., neg SLR, positive faber;s  Skin: Skin is warm, dry and intact. No rash noted.  Psychiatric: He has a normal mood and affect. His speech is normal and behavior is normal. Thought content normal.          Assessment & Plan:

## 2016-03-09 ENCOUNTER — Telehealth: Payer: Self-pay | Admitting: Family Medicine

## 2016-03-09 DIAGNOSIS — E1140 Type 2 diabetes mellitus with diabetic neuropathy, unspecified: Secondary | ICD-10-CM

## 2016-03-09 DIAGNOSIS — E7800 Pure hypercholesterolemia, unspecified: Secondary | ICD-10-CM

## 2016-03-09 NOTE — Telephone Encounter (Signed)
-----   Message from Ellamae Sia sent at 03/06/2016 11:18 AM EST ----- Regarding: Lab orders for Wednesday, 1.3.18 Lab orders for a 6 month follow up appt

## 2016-03-13 ENCOUNTER — Encounter: Payer: Self-pay | Admitting: Oncology

## 2016-03-14 ENCOUNTER — Other Ambulatory Visit (INDEPENDENT_AMBULATORY_CARE_PROVIDER_SITE_OTHER): Payer: Medicare Other

## 2016-03-14 DIAGNOSIS — E1140 Type 2 diabetes mellitus with diabetic neuropathy, unspecified: Secondary | ICD-10-CM

## 2016-03-14 DIAGNOSIS — E7800 Pure hypercholesterolemia, unspecified: Secondary | ICD-10-CM | POA: Diagnosis not present

## 2016-03-14 LAB — COMPREHENSIVE METABOLIC PANEL
ALBUMIN: 4.1 g/dL (ref 3.5–5.2)
ALK PHOS: 42 U/L (ref 39–117)
ALT: 19 U/L (ref 0–53)
AST: 17 U/L (ref 0–37)
BILIRUBIN TOTAL: 0.9 mg/dL (ref 0.2–1.2)
BUN: 16 mg/dL (ref 6–23)
CALCIUM: 9.5 mg/dL (ref 8.4–10.5)
CO2: 32 mEq/L (ref 19–32)
CREATININE: 0.81 mg/dL (ref 0.40–1.50)
Chloride: 104 mEq/L (ref 96–112)
GFR: 98.71 mL/min (ref >60.00)
Glucose, Bld: 186 mg/dL — ABNORMAL HIGH (ref 70–99)
Potassium: 4.9 mEq/L (ref 3.5–5.1)
SODIUM: 140 meq/L (ref 135–145)
TOTAL PROTEIN: 6.8 g/dL (ref 6.0–8.3)

## 2016-03-14 LAB — LIPID PANEL
Cholesterol: 159 mg/dL (ref 0–200)
HDL: 44.6 mg/dL (ref >39.00)
LDL Cholesterol: 99 mg/dL (ref 0–99)
NONHDL: 114.12
Total CHOL/HDL Ratio: 4
Triglycerides: 74 mg/dL (ref 0.0–149.0)
VLDL: 14.8 mg/dL (ref 0.0–40.0)

## 2016-03-14 LAB — HEMOGLOBIN A1C: HEMOGLOBIN A1C: 7.7 % — AB (ref 4.6–6.5)

## 2016-03-15 ENCOUNTER — Encounter: Payer: Self-pay | Admitting: Family Medicine

## 2016-03-19 ENCOUNTER — Encounter: Payer: Self-pay | Admitting: Family Medicine

## 2016-03-20 ENCOUNTER — Ambulatory Visit (INDEPENDENT_AMBULATORY_CARE_PROVIDER_SITE_OTHER): Payer: Medicare Other | Admitting: Family Medicine

## 2016-03-20 ENCOUNTER — Encounter: Payer: Self-pay | Admitting: Family Medicine

## 2016-03-20 VITALS — BP 130/80 | HR 75 | Temp 98.5°F | Ht 71.25 in | Wt 191.5 lb

## 2016-03-20 DIAGNOSIS — E1142 Type 2 diabetes mellitus with diabetic polyneuropathy: Secondary | ICD-10-CM

## 2016-03-20 DIAGNOSIS — I10 Essential (primary) hypertension: Secondary | ICD-10-CM | POA: Diagnosis not present

## 2016-03-20 DIAGNOSIS — E7800 Pure hypercholesterolemia, unspecified: Secondary | ICD-10-CM

## 2016-03-20 DIAGNOSIS — E1140 Type 2 diabetes mellitus with diabetic neuropathy, unspecified: Secondary | ICD-10-CM

## 2016-03-20 LAB — HM DIABETES FOOT EXAM

## 2016-03-20 MED ORDER — METFORMIN HCL ER 500 MG PO TB24: 500.0000 mg | ORAL_TABLET | Freq: Every day | ORAL | 11 refills | Status: DC

## 2016-03-20 MED ORDER — GUAIFENESIN-CODEINE 100-10 MG/5ML PO SYRP: 5.0000 mL | ORAL_SOLUTION | Freq: Every evening | ORAL | 0 refills | Status: DC | PRN

## 2016-03-20 NOTE — Assessment & Plan Note (Signed)
Start metformin. Encouraged exercise, weight loss, healthy eating habits.  Follow up in 3 months.

## 2016-03-20 NOTE — Progress Notes (Signed)
Pre visit review using our clinic review tool, if applicable. No additional management support is needed unless otherwise documented below in the visit note. 

## 2016-03-20 NOTE — Assessment & Plan Note (Signed)
Stable on no med. Not painful.

## 2016-03-20 NOTE — Assessment & Plan Note (Signed)
On moderate dose statin.

## 2016-03-20 NOTE — Assessment & Plan Note (Signed)
Well controlled. Continue current medication.  

## 2016-03-20 NOTE — Patient Instructions (Addendum)
Can use cough suppressant at night.  call if not improving in 7-10 days as expected.  Start metformin 500 mg daily with breakfast.  Call if intolerable SE.

## 2016-03-20 NOTE — Progress Notes (Addendum)
Subjective:    Patient ID: Alexander Yang, male    DOB: 01-Mar-1941, 76 y.o.   MRN: [id number]  HPI  76 year old male presents for 6 month follow up MD.  He is also having: 4 days of sore throat, mild nasal congestion, cough, dry. No SOB, no wheeze.  Request Cheratussin prescription. Initial fever 100.4 3 days ago. No face pain, no ear pain. No body aches.   Diabetes:  Inadequate control on no medication. He has been eating  more candy lately, not eating a lot of cabs.. But does eat several pieces of whole wheat bread a day.  Does have 2 oz OJ a day, no sweet beverages a day. Lab Results  Component Value Date   HGBA1C 7.7 (H) 03/14/2016  Using medications without difficulties: Hypoglycemic episodes: Hyperglycemic episodes: Feet problems: none Blood Sugars averaging: not checking eye exam within last year: yes Wt Readings from Last 3 Encounters:  03/20/16 191 lb 8 oz (86.9 kg)  02/15/16 196 lb 8 oz (89.1 kg)  01/27/16 194 lb (88 kg)  Body mass index is 26.52 kg/m.     Hypertension:   Good control on  Losartan 25 mg daily. BP Readings from Last 3 Encounters:  03/20/16 130/80  02/15/16 (!) 150/74  01/27/16 (!) 159/79     Using medication without problems or lightheadedness:  Chest pain with exertion: none Edema:none Short of breath:none Average home BPs: good Other issues:   Elevated Cholesterol:  High risk for CVD given DM.Marland Kitchen On moderate dose pravastatin. LDL almost at goal. Lab Results  Component Value Date   CHOL 159 03/14/2016   HDL 44.60 03/14/2016   LDLCALC 99 03/14/2016   TRIG 74.0 03/14/2016   CHOLHDL 4 03/14/2016  Using medications without problems: Muscle aches:  Diet compliance: good Exercise: 3-4 times a week, GYM Other complaints:  Prostate cancer followoed by oncologist.. Plan Lupron or other.   Review of Systems  Constitutional: Negative for fatigue.  Eyes: Negative for pain.  Respiratory: Negative for shortness of breath and wheezing.    Gastrointestinal: Negative for abdominal distention.  Genitourinary: Negative for dysuria.       Objective:   Physical Exam  Constitutional: Vital signs are normal. He appears well-developed and well-nourished.  Non-toxic appearance. He does not appear ill. No distress.  HENT:  Head: Normocephalic and atraumatic.  Right Ear: Hearing, tympanic membrane, external ear and ear canal normal. No tenderness. No foreign bodies. Tympanic membrane is not retracted and not bulging.  Left Ear: Hearing, tympanic membrane, external ear and ear canal normal. No tenderness. No foreign bodies. Tympanic membrane is not retracted and not bulging.  Nose: Nose normal. No mucosal edema or rhinorrhea. Right sinus exhibits no maxillary sinus tenderness and no frontal sinus tenderness. Left sinus exhibits no maxillary sinus tenderness and no frontal sinus tenderness.  Mouth/Throat: Uvula is midline, oropharynx is clear and moist and mucous membranes are normal. Normal dentition. No dental caries. No oropharyngeal exudate or tonsillar abscesses.  Eyes: Conjunctivae, EOM and lids are normal. Pupils are equal, round, and reactive to light. Lids are everted and swept, no foreign bodies found.  Neck: Trachea normal, normal range of motion and phonation normal. Neck supple. Carotid bruit is not present. No thyroid mass and no thyromegaly present.  Cardiovascular: Normal rate, regular rhythm, S1 normal, S2 normal, normal heart sounds, intact distal pulses and normal pulses.  Exam reveals no gallop.   No murmur heard. Pulmonary/Chest: Effort normal and breath sounds normal. No respiratory  distress. He has no wheezes. He has no rhonchi. He has no rales.  Abdominal: Soft. Normal appearance and bowel sounds are normal. There is no hepatosplenomegaly. There is no tenderness. There is no rebound, no guarding and no CVA tenderness. No hernia.  Neurological: He is alert. He has normal reflexes.  Skin: Skin is warm, dry and intact.  No rash noted.  Psychiatric: He has a normal mood and affect. His speech is normal and behavior is normal. Judgment normal.    Diabetic foot exam: Normal inspection No skin breakdown No calluses  Normal DP pulses Normal sensation to light touch and monofilament Nails normal       Assessment & Plan:

## 2016-03-27 ENCOUNTER — Encounter: Payer: Self-pay | Admitting: Family Medicine

## 2016-03-29 ENCOUNTER — Telehealth: Payer: Self-pay | Admitting: Internal Medicine

## 2016-03-29 MED ORDER — AZITHROMYCIN 250 MG PO TABS: ORAL_TABLET | ORAL | 0 refills | Status: DC

## 2016-03-29 NOTE — Telephone Encounter (Signed)
Pt called and reported persistent cough and congestion.  Symptoms have been present for >2 weeks.  Reviewed office note and my chart messages.   Zpak and cough medication to be called in.  Not at pharmacy.  Discussed with pt.  Persistent cough.  No sob.  Eating and drinking.  Explained will send in rx [facility].  Unable to send in cough syrup with codeine.  He will use robitussin DM.  Call if problems or continued symptoms.  rx sent in to pharmacy.

## 2016-03-30 MED ORDER — GUAIFENESIN-CODEINE 100-10 MG/5ML PO SYRP: 5.0000 mL | ORAL_SOLUTION | Freq: Every evening | ORAL | 0 refills | Status: DC | PRN

## 2016-03-30 NOTE — Telephone Encounter (Signed)
Per Dr Nicki Reaper note appears issue taken care of . FYI to Dr [name].

## 2016-03-30 NOTE — Telephone Encounter (Signed)
Alexander Yang notified that his prescription is ready to be picked up at the front desk.

## 2016-03-30 NOTE — Telephone Encounter (Signed)
Pt can pick up cough suppressant if needed. PRinted.

## 2016-03-30 NOTE — Telephone Encounter (Signed)
PLEASE NOTE: All timestamps contained within this report are represented as [place] Standard Time. CONFIDENTIALTY NOTICE: This fax transmission is intended only for the addressee. It contains information that is legally privileged, confidential or otherwise protected from use or disclosure. If you are not the intended recipient, you are strictly prohibited from reviewing, disclosing, copying using or disseminating any of this information or taking any action in reliance on or regarding this information. If you have received this fax in error, please notify us immediately by telephone so that we can arrange for its return to [place]. Phone: [phone], Toll-Free: [phone], Fax: [phone] Page: 1 of 2 Call Id: [id number] [place] Patient Name: Alexander Yang Gender: Male DOB: 25-Aug-1940 Age: 76 Y 42 M 23 D Return Phone Number: [phone] (Primary) Address: City/State/Zip: [place] [address] Client [facility] Night - Client Client Site [place] Physician Eliezer Lofts - MD Contact Type Call Who Is Calling Patient / Member / Family / Caregiver Call Type Triage / Clinical Caller Name Pierino Tomlins Relationship To Patient Spouse Return Phone Number [phone] (Primary) Chief Complaint Cough Reason for Call Symptomatic / Request for Health Information Initial Comment Husband was suppose to have RX sent over to Pharmacy and they do not have it. Coughing, runny nose, fever for 2 weeks. Dr [name] was suppose to call that in Tuesday PreDisposition Did not know what to do Translation No Nurse Assessment Nurse: Robby Sermon, RN, April Date/Time Eilene Ghazi Time): 03/29/2016 11:55:24 AM Confirm and document reason for call. If symptomatic, describe symptoms. ---Caller states her husband was seen on Tuesday the 9th. On the 16th he emailed his pcp and she  replied saying she would call in the Z pack. [facility] [address], [phone]. He feels like he has a light case of the flu. Low grade fever in the evening only. He has a sore throat, a slight stuffy nose, and a cough especially at night. Does the patient have any new or worsening symptoms? ---Yes Will a triage be completed? ---Yes Related visit to physician within the last 2 weeks? ---Yes Does the PT have any chronic conditions? (i.e. diabetes, asthma, etc.) ---Yes List chronic conditions. ---Diabetic, hypertension, high cholesterol Is this a behavioral health or substance abuse call? ---No Nurse: Robby Sermon, RN, April Date/Time (Eastern Time): 03/29/2016 11:57:32 AM Please select the assessment type ---Verbal order / New medication order Does the client directives allow for assistance with medications after hours? ---Yes Other current medications? ---Yes List current medications. ---Metformin, Losartan 20 mg once daily, Pravastatin 40 mg once daily PLEASE NOTE: All timestamps contained within this report are represented as [place] Standard Time. CONFIDENTIALTY NOTICE: This fax transmission is intended only for the addressee. It contains information that is legally privileged, confidential or otherwise protected from use or disclosure. If you are not the intended recipient, you are strictly prohibited from reviewing, disclosing, copying using or disseminating any of this information or taking any action in reliance on or regarding this information. If you have received this fax in error, please notify us immediately by telephone so that we can arrange for its return to [place]. Phone: [phone], Toll-Free: [phone], Fax: [phone] Page: 2 of 2 Call Id: [id number] Nurse Assessment Medication allergies? ---No Pharmacy name and phone number. [facility] [address], [phone] Does the client directive allow for RN to call in the medication order to  the  pharmacy? ---Yes Guidelines Guideline Title Affirmed Question Affirmed Notes Nurse Date/Time Eilene Ghazi Time) Medication Question Call [1] Prescription not at pharmacy AND [2] was prescribed by PCP recently (Exception: RN has access to EMR and prescription is recorded there. Go to Home Care and confirm for pharmacy.) Robby Sermon, RN, April 03/29/2016 12:04:47 PM Disp. Time Eilene Ghazi Time) Disposition Final User 03/29/2016 12:06:34 PM Called On-Call Provider Robby Sermon, RN, April 03/29/2016 12:05:08 PM Call PCP Now Yes Robby Sermon, RN, April Caller Understands: Yes Disagree/Comply: Comply Care Advice Given Per Guideline CALL PCP NOW: You need to discuss this with your child's doctor. I'll page him now. If you haven't heard from the on-call doctor within 30 minutes, call again. CARE ADVICE given per Medication Question Call - No Triage (Pediatric) guideline. Paging DoctorName Phone DateTime Result/Outcome Message Type Notes Einar Pheasant - MD [id number] 03/29/2016 12:06:34 PM Called On Call Provider - Reached Doctor Paged Einar Pheasant - MD 03/29/2016 12:11:56 PM Spoke with On Call - General Message Result Doctor Nicki Reaper asked to be warm transferred to caller.

## 2016-04-12 DIAGNOSIS — C61 Malignant neoplasm of prostate: Secondary | ICD-10-CM | POA: Diagnosis not present

## 2016-04-13 ENCOUNTER — Encounter: Payer: Self-pay | Admitting: *Deleted

## 2016-04-23 ENCOUNTER — Other Ambulatory Visit: Payer: Self-pay | Admitting: Family Medicine

## 2016-05-04 ENCOUNTER — Ambulatory Visit (HOSPITAL_BASED_OUTPATIENT_CLINIC_OR_DEPARTMENT_OTHER): Payer: Medicare Other | Admitting: Oncology

## 2016-05-04 ENCOUNTER — Telehealth: Payer: Self-pay | Admitting: Oncology

## 2016-05-04 VITALS — BP 150/81 | HR 65 | Temp 98.1°F | Resp 18 | Ht 71.5 in | Wt 190.3 lb

## 2016-05-04 DIAGNOSIS — C61 Malignant neoplasm of prostate: Secondary | ICD-10-CM

## 2016-05-04 DIAGNOSIS — R9721 Rising PSA following treatment for malignant neoplasm of prostate: Secondary | ICD-10-CM

## 2016-05-04 NOTE — Progress Notes (Signed)
Hematology and Oncology Follow Up Visit  Alexander Yang [id number] 01/29/1941 76 y.o. 05/04/2016 3:42 PM [name], [name], [name], MD   Principle Diagnosis: 76 year old gentleman with the prostate cancer diagnosed in 2009 he had a Gleason score 3+3 = 6 PSA of 3.17. He subsequently developed more aggressive cancer Gleason score 4+4 = 8, PSA 8.6 in 2011.   Prior Therapy: Observation and surveillance between 2009 in 2011. He was found to have a Gleason score 4+4 = 8 in 2011 and PSA of . He subsequently was evaluated the [facility] for proton therapy. He received radiation therapy between October 2011 until December 2011. He received concomitant Taxotere chemotherapy at a low dose but it was unclear to me whether he had it as a part of a clinical trial. He also received 6 months of hormone therapy up until July 2012. His PSA nadir was down to 0.1 In December 2013. His PSA continue to be low close to 0.1 and 0.2 Up till December 2015.  In June 2016 was up to 0.4 and his most recent PSA in September 2016 was up to 0.7.    Current therapy: Observation and surveillance for biochemical relapse prostate cancer.  Interim History:  Alexander Yang presents today for a follow-up visit. Since the last visit, he reports no major changes in his health. He was diagnosed with the flu in the month of January and has fully recovered at this time. He remains active and has resumed most activities of daily living. He reports no new complaints such as dysuria or hematuria. He does not report any arthralgias or myalgias. No major changes in his performance status or activity level.  He does not report any headaches, blurry vision, seizures. He denies any unsteadiness or syncope. Does not report any fevers, chills or sweats. Does not report any cough or hemoptysis. Does not report any chest pain, palpitation, orthopnea or leg edema. He denied any nausea, vomiting or abdominal pain. Denies any frequency  urgency or hesitancy. Remaining review of systems unremarkable.  Medications: I have reviewed the patient's current medications.  Current Outpatient Prescriptions  Medication Sig Dispense Refill  . Broccoli Extract 500 MG TABS Take 300 mg by mouth.    . Cholecalciferol (VITAMIN D3) 2000 units capsule Take by mouth.    Nyoka Cowden Tea, Camillia sinensis, ([facility]S SUPER GREEN TEA EXTRACT PO) Take 60 mg by mouth.    Marland Kitchen guaiFENesin-codeine (ROBITUSSIN AC) 100-10 MG/5ML syrup Take 5-10 mLs by mouth at bedtime as needed for cough. 180 mL 0  . ketoconazole (NIZORAL) 2 % cream Apply 1 application topically daily. 15 g 0  . losartan (COZAAR) 25 MG tablet TAKE ONE TABLET BY MOUTH ONCE DAILY 90 tablet 1  . metFORMIN (GLUCOPHAGE-XR) 500 MG 24 hr tablet Take 1 tablet (500 mg total) by mouth daily with breakfast. 30 tablet 11  . pravastatin (PRAVACHOL) 40 MG tablet TAKE ONE TABLET BY MOUTH ONCE DAILY 90 tablet 2  . sildenafil (REVATIO) 20 MG tablet Take 2 to 5 tablets by mouth 30 minutes prior to intercourse 30 tablet 2  . TURMERIC PO     . UNABLE TO FIND Take 300 mg by mouth. pomegranite     No current facility-administered medications for this visit.      Allergies: No Known Allergies  Past Medical History, Surgical history, Social history, and Family History were reviewed and updated.  Exam: ECOG 0 Blood pressure (!) 150/81, pulse 65, temperature 98.1 F (36.7 C), temperature source Oral,  resp. rate 18, height 5' 11.5" (1.816 m), weight 190 lb 4.8 oz (86.3 kg), SpO2 100 %. ECOG 0.  General appearance: Alert, awake gentleman without distress. Head: Normocephalic, without obvious abnormality. No oral rash noted. Neck: no adenopathy or thyroid masses. Resp: clear to auscultation bilaterally no wheezes or rales to percussion.  Chest wall: no tenderness or masses. Cardio: regular rate and rhythm, S1, S2 normal, no murmur, click, rub or gallop Abdomen: Soft, nontender without rebound or guarding. No  rebound or guarding. Extremities: extremities normal, atraumatic, no cyanosis or edema Pulses: 2+ and symmetric Skin: Skin color, texture, turgor normal. No rashes or lesions Lymph nodes: Cervical, supraclavicular, and axillary nodes normal.   PSA on 04/20/2015 was 2.6.  PSA on 07/19/2015 was 3.7. PSA on 10/18/2015 was 3.2. PSA on 01/11/2016 was 4.4. PSA on 04/12/2016 was 8.2.  Impression and Plan:   76 year old gentleman with the following issues:  1. Prostate cancer initially diagnosed in 2009 with a Gleason score 3+3 = 6 and a PSA of 3.17. He was treated with a definitive hormonal therapy utilizing proton therapy at the [facility] in October 2011. He received a 6 months of hormone therapy that was completed in July 2012. His PSA nadir down to 0.1.   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  Given the rapid rise of his PSA, I have recommended restaging workup and possible start androgen deprivation in near future. He is agreeable to proceed with this step. We will proceed with an Ax[facility] PET scan and have a follow-up after that to discuss the results. Potentially start androgen deprivation therapy in March 2018.  Risks and benefits of androgen deprivation were reviewed again today and he is more open to the idea at this time.  2. Follow-up: Will be in the next few weeks after staging workup is complete.  [facility], MD 2/23/20183:42 PM

## 2016-05-04 NOTE — Telephone Encounter (Signed)
Gave patient AVS and calendar for March appt 05/23/16. [facility] will contact patient about Pet scan.

## 2016-05-07 ENCOUNTER — Encounter: Payer: Self-pay | Admitting: Oncology

## 2016-05-08 ENCOUNTER — Encounter: Payer: Self-pay | Admitting: Oncology

## 2016-05-08 ENCOUNTER — Telehealth: Payer: Self-pay | Admitting: *Deleted

## 2016-05-08 NOTE — Telephone Encounter (Signed)
This RN received a call from Radiology scheduling regarding patient's Ax[facility] pet scan. Scan is scheduled for March 20,2018 at 2:00 pm. Patient needs to be at [facility] at 1:45 pm. Orders per scheduler: 1.) no exercise or strenuous activity 24 hours PRIOR to scan, 2.) NPO 4 hours PRIOR to scan (so, no eating after 10:00 am). Instructed patient to call [facility] because he needs to get these directions.

## 2016-05-09 NOTE — Telephone Encounter (Signed)
This RN called patient again to give him information and directions regarding the [place] pet scan on 05/29/16. As noted below by me on 05/08/16, these instructions were given to patient and he verbalized understanding.

## 2016-05-15 ENCOUNTER — Encounter: Payer: Self-pay | Admitting: Oncology

## 2016-05-23 ENCOUNTER — Ambulatory Visit: Payer: Medicare Other | Admitting: Oncology

## 2016-05-24 ENCOUNTER — Telehealth: Payer: Self-pay | Admitting: *Deleted

## 2016-05-24 ENCOUNTER — Encounter: Payer: Self-pay | Admitting: Oncology

## 2016-05-24 NOTE — Telephone Encounter (Signed)
Lm on vm for patient, if he has questions re: PET, to contact radiology dept for instructions.

## 2016-05-29 ENCOUNTER — Encounter: Payer: Self-pay | Admitting: Oncology

## 2016-05-29 ENCOUNTER — Encounter (HOSPITAL_COMMUNITY)
Admission: RE | Admit: 2016-05-29 | Discharge: 2016-05-29 | Disposition: A | Discharge status: Home / Self Care | Payer: Medicare Other | Source: Ambulatory Visit | Attending: Oncology | Admitting: Oncology

## 2016-05-29 ENCOUNTER — Other Ambulatory Visit: Payer: Self-pay | Admitting: Oncology

## 2016-05-29 DIAGNOSIS — C61 Malignant neoplasm of prostate: Secondary | ICD-10-CM | POA: Insufficient documentation

## 2016-05-29 DIAGNOSIS — R9721 Rising PSA following treatment for malignant neoplasm of prostate: Secondary | ICD-10-CM | POA: Diagnosis not present

## 2016-05-29 MED ORDER — AXUMIN (FLUCICLOVINE F 18) INJECTION
9.4300 | Freq: Once | INTRAVENOUS | Status: AC
  Administered 2016-05-29: 9.43 via INTRAVENOUS

## 2016-05-29 NOTE — Progress Notes (Signed)
DISCONTINUE ON PATHWAY REGIMEN - Prostate  No Medical Intervention - Off Treatment.  REASON: Disease Progression PRIOR TREATMENT: Prostate60: Active Surveillance  START ON PATHWAY REGIMEN - Prostate     A cycle is every 12 weeks:     Leuprolide acetate depot    Daily:     Bicalutamide   **Always confirm dose/schedule in your pharmacy ordering system**    Patient Characteristics: Adenocarcinoma, Biochemical Failure (Detectable PSA, No Radiographic Metastases), Hormone Naive Current radiographic evidence of distant metastasis? No Histology: Adenocarcinoma AJCC T Category: cTX Gleason Primary: 4 AJCC N Category: NX Gleason Secondary: 4 AJCC M Category: M0 Gleason Score: 8 AJCC 8 Stage Grouping: Unknown PSA Values (ng/mL): < 10  Intent of Therapy: Non-Curative / Palliative Intent, Discussed with Patient

## 2016-05-30 ENCOUNTER — Telehealth: Payer: Self-pay | Admitting: Oncology

## 2016-05-30 NOTE — Telephone Encounter (Signed)
swpt to confirm March and April appt date/times per LOS

## 2016-06-01 ENCOUNTER — Ambulatory Visit: Payer: Medicare Other

## 2016-06-01 ENCOUNTER — Ambulatory Visit (HOSPITAL_BASED_OUTPATIENT_CLINIC_OR_DEPARTMENT_OTHER): Payer: Medicare Other

## 2016-06-01 VITALS — BP 155/78 | HR 80 | Temp 97.7°F | Resp 18

## 2016-06-01 DIAGNOSIS — C61 Malignant neoplasm of prostate: Secondary | ICD-10-CM | POA: Diagnosis not present

## 2016-06-01 DIAGNOSIS — Z5111 Encounter for antineoplastic chemotherapy: Secondary | ICD-10-CM | POA: Diagnosis present

## 2016-06-01 MED ORDER — LEUPROLIDE ACETATE 7.5 MG IM KIT
7.5000 mg | PACK | Freq: Once | INTRAMUSCULAR | Status: AC
  Administered 2016-06-01: 7.5 mg via INTRAMUSCULAR
  Filled 2016-06-01: qty 7.5

## 2016-06-04 ENCOUNTER — Encounter: Payer: Self-pay | Admitting: Family Medicine

## 2016-06-04 ENCOUNTER — Encounter: Payer: Self-pay | Admitting: Oncology

## 2016-06-07 ENCOUNTER — Other Ambulatory Visit (INDEPENDENT_AMBULATORY_CARE_PROVIDER_SITE_OTHER): Payer: Medicare Other

## 2016-06-07 ENCOUNTER — Telehealth: Payer: Self-pay | Admitting: Family Medicine

## 2016-06-07 DIAGNOSIS — E1140 Type 2 diabetes mellitus with diabetic neuropathy, unspecified: Secondary | ICD-10-CM | POA: Diagnosis not present

## 2016-06-07 DIAGNOSIS — E7800 Pure hypercholesterolemia, unspecified: Secondary | ICD-10-CM

## 2016-06-07 LAB — HEMOGLOBIN A1C: HEMOGLOBIN A1C: 6.9 % — AB (ref 4.6–6.5)

## 2016-06-07 NOTE — Telephone Encounter (Signed)
-----   Message from Ellamae Sia sent at 06/07/2016  9:20 AM EDT ----- Regarding: Lab orders asap DM lab orders

## 2016-06-12 ENCOUNTER — Telehealth: Payer: Self-pay | Admitting: Family Medicine

## 2016-06-12 ENCOUNTER — Other Ambulatory Visit: Payer: Medicare Other

## 2016-06-12 DIAGNOSIS — E1140 Type 2 diabetes mellitus with diabetic neuropathy, unspecified: Secondary | ICD-10-CM

## 2016-06-12 DIAGNOSIS — E7800 Pure hypercholesterolemia, unspecified: Secondary | ICD-10-CM

## 2016-06-12 NOTE — Telephone Encounter (Signed)
-----   Message from Ellamae Sia sent at 06/01/2016 11:28 AM EDT ----- Regarding: Lab orders for Tuesday, 4.3.18 Lab orders for a 3 month follow up appt.

## 2016-06-12 NOTE — Telephone Encounter (Signed)
I believe pt has already had labs!  let me know if he is expecting something else.

## 2016-06-19 ENCOUNTER — Encounter: Payer: Self-pay | Admitting: Family Medicine

## 2016-06-19 ENCOUNTER — Ambulatory Visit (INDEPENDENT_AMBULATORY_CARE_PROVIDER_SITE_OTHER): Payer: Medicare Other | Admitting: Family Medicine

## 2016-06-19 VITALS — BP 136/78 | HR 56 | Temp 97.5°F | Ht 71.25 in | Wt 190.0 lb

## 2016-06-19 DIAGNOSIS — I10 Essential (primary) hypertension: Secondary | ICD-10-CM | POA: Diagnosis not present

## 2016-06-19 DIAGNOSIS — C61 Malignant neoplasm of prostate: Secondary | ICD-10-CM | POA: Diagnosis not present

## 2016-06-19 DIAGNOSIS — I700 Atherosclerosis of aorta: Secondary | ICD-10-CM | POA: Diagnosis not present

## 2016-06-19 DIAGNOSIS — E1140 Type 2 diabetes mellitus with diabetic neuropathy, unspecified: Secondary | ICD-10-CM

## 2016-06-19 LAB — HM DIABETES FOOT EXAM

## 2016-06-19 MED ORDER — ASPIRIN 81 MG PO TABS: 81.0000 mg | ORAL_TABLET | Freq: Every day | ORAL | Status: DC

## 2016-06-19 MED ORDER — METFORMIN HCL ER 500 MG PO TB24: 500.0000 mg | ORAL_TABLET | Freq: Every day | ORAL | 3 refills | Status: DC

## 2016-06-19 NOTE — Patient Instructions (Addendum)
Keep working on healthy eating and regular exercise.  Continue current meds.

## 2016-06-19 NOTE — Assessment & Plan Note (Addendum)
Counseled pt on reducing risk.  Encouraged baby aspirin daily ( pt stated already doing this).  Offered referral to cardiology for consideration of further eval.. Pt refused. He is asymptomatic at this time.

## 2016-06-19 NOTE — Progress Notes (Signed)
Pre visit review using our clinic review tool, if applicable. No additional management support is needed unless otherwise documented below in the visit note. 

## 2016-06-19 NOTE — Assessment & Plan Note (Signed)
Now on Lupron. No weight gain but increase in exercise.

## 2016-06-19 NOTE — Assessment & Plan Note (Signed)
Well controlled. Continue current medication.  

## 2016-06-19 NOTE — Assessment & Plan Note (Signed)
Improved control on metformin. Encouraged exercise, weight loss, healthy eating habits.  

## 2016-06-19 NOTE — Progress Notes (Signed)
   Subjective:    Patient ID: [name], male    DOB: [birth year], 76 y.o.   MRN: [id number]  HPI   76 year old male presents for 3 month follow up DM.  Diabetes:   Now at goal on low dose metformin 500 mg daily XR. Lab Results  Component Value Date   HGBA1C 6.9 (H) 06/07/2016  Using medications without difficulties: initially  Increase in gas and bloating, now tolerating well. Hypoglycemic episodes:? Hyperglycemic episodes:? Feet problems: none Blood Sugars averaging: not checking eye exam within last year: yes.   Going to gym, healthy eating.  Has started on Lupron for prostate cancer.  Increased appetite.  Wt Readings from Last 3 Encounters:  06/19/16 190 lb (86.2 kg)  05/04/16 190 lb 4.8 oz (86.3 kg)  03/20/16 191 lb 8 oz (86.9 kg)    Hypertension:   good control on losartan BP Readings from Last 3 Encounters:  06/19/16 136/78  06/01/16 (!) 155/78  05/04/16 (!) 150/81  Using medication without problems or lightheadedness:  none Chest pain with exertion: none Edema:none Short of breath:none Average home BPs: good. Other issues:  Aortic atherosclerosis noted incidentally of PET scan for prostate cancer.  Good treadmill stress test. Asymptomatic.  Review of Systems  Constitutional: Negative for fatigue and fever.  HENT: Negative for ear pain.   Eyes: Negative for pain.  Respiratory: Negative for shortness of breath.   Cardiovascular: Negative for chest pain.  Musculoskeletal:       Neck stiffness, decreased ROM and mild pain.Marland Kitchen No pain in arms, no numbness, no weakness.       Objective:   Physical Exam  Constitutional: Vital signs are normal. He appears well-developed and well-nourished.  HENT:  Head: Normocephalic.  Right Ear: Hearing normal.  Left Ear: Hearing normal.  Nose: Nose normal.  Mouth/Throat: Oropharynx is clear and moist and mucous membranes are normal.  Neck: Trachea normal. Carotid bruit is not present. No thyroid mass and no  thyromegaly present.  Cardiovascular: Normal rate, regular rhythm and normal pulses.  Exam reveals no gallop, no distant heart sounds and no friction rub.   No murmur heard. No peripheral edema  Pulmonary/Chest: Effort normal and breath sounds normal. No respiratory distress.  Musculoskeletal:       Cervical back: He exhibits decreased range of motion. He exhibits no tenderness and no bony tenderness.  Skin: Skin is warm, dry and intact. No rash noted.  Psychiatric: He has a normal mood and affect. His speech is normal and behavior is normal. Thought content normal.      Diabetic foot exam: Normal inspection No skin breakdown No calluses  Normal DP pulses Normal sensation to light touch and monofilament Nails normal     Assessment & Plan:

## 2016-06-21 ENCOUNTER — Encounter: Payer: Self-pay | Admitting: Family Medicine

## 2016-07-02 ENCOUNTER — Encounter: Payer: Self-pay | Admitting: Family Medicine

## 2016-07-05 ENCOUNTER — Ambulatory Visit (HOSPITAL_BASED_OUTPATIENT_CLINIC_OR_DEPARTMENT_OTHER): Payer: Medicare Other

## 2016-07-05 ENCOUNTER — Ambulatory Visit (HOSPITAL_BASED_OUTPATIENT_CLINIC_OR_DEPARTMENT_OTHER): Payer: Medicare Other | Admitting: Oncology

## 2016-07-05 ENCOUNTER — Telehealth: Payer: Self-pay | Admitting: Oncology

## 2016-07-05 VITALS — BP 163/80 | HR 63 | Temp 97.9°F | Resp 18 | Ht 71.5 in | Wt 189.7 lb

## 2016-07-05 DIAGNOSIS — C61 Malignant neoplasm of prostate: Secondary | ICD-10-CM

## 2016-07-05 DIAGNOSIS — Z5111 Encounter for antineoplastic chemotherapy: Secondary | ICD-10-CM | POA: Diagnosis present

## 2016-07-05 MED ORDER — LEUPROLIDE ACETATE 7.5 MG IM KIT
7.5000 mg | PACK | Freq: Once | INTRAMUSCULAR | Status: AC
  Administered 2016-07-05: 7.5 mg via INTRAMUSCULAR
  Filled 2016-07-05: qty 7.5

## 2016-07-05 NOTE — Telephone Encounter (Signed)
Gave patient AVS and calender per 4/26 los.  

## 2016-07-05 NOTE — Progress Notes (Signed)
Hematology and Oncology Follow Up Visit  Alexander Yang [id number] [birth year] 76 y.o. 07/05/2016 11:28 AM Alexander Yang, [name], Alexander E, MD   Principle Diagnosis: 76 year old gentleman with the prostate cancer diagnosed in 2009 he had a Gleason score 3+3 = 6 PSA of 3.17. He subsequently developed more aggressive cancer Gleason score 4+4 = 8, PSA 8.6 in 2011.   Prior Therapy: Observation and surveillance between 2009 in 2011. He was found to have a Gleason score 4+4 = 8 in 2011 and PSA of . He subsequently was evaluated the [facility] for proton therapy. He received radiation therapy between October 2011 until December 2011. He received concomitant Taxotere chemotherapy at a low dose but it was unclear to me whether he had it as a part of a clinical trial. He also received 6 months of hormone therapy up until July 2012. His PSA nadir was down to 0.1 In December 2013. His PSA continue to be low close to 0.1 and 0.2 Up till December 2015.  In June 2016 was up to 0.4 and his most recent PSA in September 2016 was up to 0.7.    Current therapy: Androgen deprivation therapy with Lupron at 7.5 mg on a monthly basis started in March 2018.  Interim History:  [name] presents today for a follow-up visit. Since the last visit, he started Lupron and has tolerated it reasonably well without complications. He did report hot flashes periodically that has been manageable. He remains active and has resumed most activities of daily living. He reports no new complaints such as dysuria or hematuria. He does not report any arthralgias or myalgias. No major changes in his performance status or activity level.  He does not report any headaches, blurry vision, seizures. He denies any unsteadiness or syncope. Does not report any fevers, chills or sweats. Does not report any cough or hemoptysis. Does not report any chest pain, palpitation, orthopnea or leg edema. He denied any nausea, vomiting or  abdominal pain. Denies any frequency urgency or hesitancy. Remaining review of systems unremarkable.  Medications: I have reviewed the patient's current medications.  Current Outpatient Prescriptions  Medication Sig Dispense Refill  . aspirin 81 MG tablet Take 1 tablet (81 mg total) by mouth daily. 30 tablet   . Broccoli Extract 500 MG TABS Take 300 mg by mouth.    . Cholecalciferol (VITAMIN D3) 2000 units capsule Take by mouth.    Nyoka Cowden Tea, Camillia sinensis, ([facility]S SUPER GREEN TEA EXTRACT PO) Take 60 mg by mouth.    Marland Kitchen ketoconazole (NIZORAL) 2 % cream Apply 1 application topically daily. 15 g 0  . leuprolide (LUPRON) 3.75 MG injection Inject 3.75 mg into the muscle every 28 (twenty-eight) days.    Marland Kitchen losartan (COZAAR) 25 MG tablet TAKE ONE TABLET BY MOUTH ONCE DAILY 90 tablet 1  . metFORMIN (GLUCOPHAGE-XR) 500 MG 24 hr tablet Take 1 tablet (500 mg total) by mouth daily with breakfast. 90 tablet 3  . pravastatin (PRAVACHOL) 40 MG tablet TAKE ONE TABLET BY MOUTH ONCE DAILY 90 tablet 2  . sildenafil (REVATIO) 20 MG tablet Take 2 to 5 tablets by mouth 30 minutes prior to intercourse 30 tablet 2  . TURMERIC PO     . UNABLE TO FIND Take 300 mg by mouth. pomegranite     No current facility-administered medications for this visit.      Allergies: No Known Allergies  Past Medical History, Surgical history, Social history, and Family History were reviewed and updated.  Exam: ECOG 0 Blood pressure (!) 163/80, pulse 63, temperature 97.9 F (36.6 C), temperature source Oral, resp. rate 18, height 5' 11.5" (1.816 m), weight 189 lb 11.2 oz (86 kg), SpO2 100 %. ECOG 0.  General appearance: well appearing man without distress.  Head: Normocephalic, without obvious abnormality. No oral ulcers.  Neck: no adenopathy or thyroid masses. Resp: clear to auscultation bilaterally no wheezes or rales to percussion.  Chest wall: no tenderness or masses. Cardio: regular rate and rhythm, S1, S2 normal, no  murmur, click, rub or gallop Abdomen: Soft, nontender without rebound or guarding. No rebound or guarding. Extremities: extremities normal, atraumatic, no cyanosis or edema Pulses: 2+ and symmetric Skin: Skin color, texture, turgor normal. No rashes or lesions Lymph nodes: Cervical, supraclavicular, and axillary nodes normal.   PSA on 04/20/2015 was 2.6.  PSA on 07/19/2015 was 3.7. PSA on 10/18/2015 was 3.2. PSA on 01/11/2016 was 4.4. PSA on 04/12/2016 was 8.2.  Impression and Plan:   76 year old gentleman with the following issues:  1. Prostate cancer initially diagnosed in 2009 with a Gleason score 3+3 = 6 and a PSA of 3.17. He was treated with a definitive hormonal therapy utilizing proton therapy at the [facility] in October 2011. He received a 6 months of hormone therapy that was completed in July 2012. His PSA nadir down to 0.1.   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  He is currently on Lupron at 7.5 mg monthly and has tolerated it relatively well. The plan is to continue therapy for now and repeat PSA in 08/2016.  2. Follow-up: Will be 2 months for a clinical visit.    [facility], MD 4/26/201811:28 AM

## 2016-07-05 NOTE — Patient Instructions (Signed)
Leuprolide depot injection What is this medicine? LEUPROLIDE (loo PROE lide) is a man-made protein that acts like a natural hormone in the body. It decreases testosterone in men and decreases estrogen in women. In men, this medicine is used to treat advanced prostate cancer. In women, some forms of this medicine may be used to treat endometriosis, uterine fibroids, or other male hormone-related problems. This medicine may be used for other purposes; ask your health care provider or pharmacist if you have questions. COMMON BRAND NAME(S): Eligard, Lupron Depot, Lupron Depot-Ped, Viadur What should I tell my health care provider before I take this medicine? They need to know if you have any of these conditions: -diabetes -heart disease or previous heart attack -high blood pressure -high cholesterol -mental illness -osteoporosis -pain or difficulty passing urine -seizures -spinal cord metastasis -stroke -suicidal thoughts, plans, or attempt; a previous suicide attempt by you or a family member -tobacco smoker -unusual vaginal bleeding (women) -an unusual or allergic reaction to leuprolide, benzyl alcohol, other medicines, foods, dyes, or preservatives -pregnant or trying to get pregnant -breast-feeding How should I use this medicine? This medicine is for injection into a muscle or for injection under the skin. It is given by a health care professional in a hospital or clinic setting. The specific product will determine how it will be given to you. Make sure you understand which product you receive and how often you will receive it. Talk to your pediatrician regarding the use of this medicine in children. Special care may be needed. Overdosage: If you think you have taken too much of this medicine contact a poison control center or emergency room at once. NOTE: This medicine is only for you. Do not share this medicine with others. What if I miss a dose? It is important not to miss a dose.  Call your doctor or health care professional if you are unable to keep an appointment. Depot injections: Depot injections are given either once-monthly, every 12 weeks, every 16 weeks, or every 24 weeks depending on the product you are prescribed. The product you are prescribed will be based on if you are male or male, and your condition. Make sure you understand your product and dosing. What may interact with this medicine? Do not take this medicine with any of the following medications: -chasteberry This medicine may also interact with the following medications: -herbal or dietary supplements, like black cohosh or DHEA -male hormones, like estrogens or progestins and birth control pills, patches, rings, or injections -male hormones, like testosterone This list may not describe all possible interactions. Give your health care provider a list of all the medicines, herbs, non-prescription drugs, or dietary supplements you use. Also tell them if you smoke, drink alcohol, or use illegal drugs. Some items may interact with your medicine. What should I watch for while using this medicine? Visit your doctor or health care professional for regular checks on your progress. During the first weeks of treatment, your symptoms may get worse, but then will improve as you continue your treatment. You may get hot flashes, increased bone pain, increased difficulty passing urine, or an aggravation of nerve symptoms. Discuss these effects with your doctor or health care professional, some of them may improve with continued use of this medicine. Male patients may experience a menstrual cycle or spotting during the first months of therapy with this medicine. If this continues, contact your doctor or health care professional. What side effects may I notice from receiving this medicine? Side   effects that you should report to your doctor or health care professional as soon as possible: -allergic reactions like skin  rash, itching or hives, swelling of the face, lips, or tongue -breathing problems -chest pain -depression or memory disorders -pain in your legs or groin -pain at site where injected or implanted -seizures -severe headache -swelling of the feet and legs -suicidal thoughts or other mood changes -visual changes -vomiting Side effects that usually do not require medical attention (report to your doctor or health care professional if they continue or are bothersome): -breast swelling or tenderness -decrease in sex drive or performance -diarrhea -hot flashes -loss of appetite -muscle, joint, or bone pains -nausea -redness or irritation at site where injected or implanted -skin problems or acne This list may not describe all possible side effects. Call your doctor for medical advice about side effects. You may report side effects to FDA at 1-800-FDA-1088. Where should I keep my medicine? This drug is given in a hospital or clinic and will not be stored at home. NOTE: This sheet is a summary. It may not cover all possible information. If you have questions about this medicine, talk to your doctor, pharmacist, or health care provider.  2018 Elsevier/Gold Standard (2015-08-11 09:45:53)  

## 2016-07-10 ENCOUNTER — Encounter: Payer: Self-pay | Admitting: Family Medicine

## 2016-07-11 ENCOUNTER — Ambulatory Visit
Admission: RE | Admit: 2016-07-11 | Discharge: 2016-07-11 | Disposition: A | Discharge status: Home / Self Care | Payer: Medicare Other | Source: Ambulatory Visit | Attending: Family Medicine | Admitting: Family Medicine

## 2016-07-11 ENCOUNTER — Encounter: Payer: Self-pay | Admitting: *Deleted

## 2016-07-11 ENCOUNTER — Ambulatory Visit (INDEPENDENT_AMBULATORY_CARE_PROVIDER_SITE_OTHER): Payer: Medicare Other | Admitting: Family Medicine

## 2016-07-11 ENCOUNTER — Encounter: Payer: Self-pay | Admitting: Family Medicine

## 2016-07-11 VITALS — BP 130/80 | HR 68 | Temp 98.3°F | Ht 71.25 in | Wt 186.8 lb

## 2016-07-11 DIAGNOSIS — W19XXXA Unspecified fall, initial encounter: Secondary | ICD-10-CM | POA: Diagnosis not present

## 2016-07-11 DIAGNOSIS — M25531 Pain in right wrist: Secondary | ICD-10-CM

## 2016-07-11 DIAGNOSIS — M25561 Pain in right knee: Secondary | ICD-10-CM

## 2016-07-11 NOTE — Progress Notes (Signed)
Dr. Frederico Hamman T. Kammie Scioli, MD, [facility] [place], [address] Phone: [phone] Fax: [phone]  07/11/2016  Patient: Alexander Yang, MRN: [id number], DOB: [birth year], 76 y.o.  Primary Physician:  Eliezer Lofts, MD   Chief Complaint  Patient presents with  . Fall    Monday afternoon  . Knee Pain    Right  . Wrist Pain    Right   Subjective:   Alexander Yang is a 76 y.o. very pleasant male patient who presents with the following:  Pleasant gentleman who fell 2 days prior and landed on his hands in an outstretched position. At the same time he twisted his knee. He has improved somewhat since then, but he does have some swelling in the dorsum of his wrist and some pain with extension at the wrist as well as ulnar and lateral deviation.  Patient also has some pain in the knee as well as a mild effusion. This has been improving over time. Does have pain with flexion. There is no bruising.  Past Medical History, Surgical History, Social History, Family History, Problem List, Medications, and Allergies have been reviewed and updated if relevant.  Patient Active Problem List   Diagnosis Date Noted  . Aortic atherosclerosis ([place]) 06/19/2016  . Pain in right buttock 02/15/2016  . Diabetic peripheral neuropathy associated with type 2 diabetes mellitus ([place]) 08/23/2015  . Counseling regarding end of life decision making 08/31/2014  . Viral URI 02/16/2014  . HTN (hypertension) 01/27/2013  . Low testosterone 07/28/2012  . ED (erectile dysfunction) 01/25/2012  . OTHER DISORDER OF COCCYX 12/09/2009  . MICROALBUMINURIA 09/14/2009  . DERMATITIS, SEBORRHEIC 01/06/2009  . HYPERCHOLESTEROLEMIA 06/23/2007  . Diabetes mellitus with neurological manifestations, controlled ([place]) 10/22/2006  . 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 CEREBROVASCULAR ACCIDENT, HX OF 10/01/2006  . Prostate cancer ([place]) 08/23/2006     Past Medical History:  Diagnosis Date  . Cerebrovascular accident ([place])   . Diabetes mellitus without complication ([place])    controlled with diet  . Hypertension   . [place] (NIH) Stroke Scale dysarthria score 1, mild to moderate dysarthria, patient slurs at least some words and, at worst, can be understood with some difficulty 1980  . Osteoporosis   . Prostate cancer [facility])     Past Surgical History:  Procedure Laterality Date  . ACL Tear  03/12/1978  . PROSTATE BIOPSY    . TONSILLECTOMY  03/13/1947  . VASECTOMY      Social History   Social History  . Marital status: Married    Spouse name: N/A  . Number of children: 2  . Years of education: N/A   Occupational History  . Retired    Social History Main Topics  . Smoking status: Former Smoker    Packs/day: 2.00    Years: 10.00    Types: Cigarettes    Quit date: 03/12/1965  . Smokeless tobacco: Never Used  . Alcohol use 0.0 oz/week     Comment: Glass of wine daily  . Drug use: No  . Sexual activity: No   Other Topics Concern  . Not on file   Social History Narrative   Exercising: 3-4 times a week.   Healthy eating habits.   Full Code   Has a living will, has HCPOA: Physiological scientist (reviewed 2015)    Family History  Problem Relation Age of Onset  . Cerebral aneurysm Mother   . Diabetes Father   .  Bradycardia Father   . Benign prostatic hyperplasia Father   . Supraventricular tachycardia Brother   . Supraventricular tachycardia Brother     No Known Allergies  Medication list reviewed and updated in full in [facility].  GEN: No fevers, chills. Nontoxic. Primarily MSK c/o today. MSK: Detailed in the HPI GI: tolerating PO intake without difficulty Neuro: No numbness, parasthesias, or tingling associated. Otherwise the pertinent positives of the ROS are noted above.   Objective:   BP 130/80   Pulse 68   Temp 98.3 F (36.8 C) (Oral)   Ht 5' 11.25" (1.81 m)   Wt 186 lb  12 oz (84.7 kg)   BMI 25.86 kg/m    GEN: WDWN, NAD, Non-toxic, Alert & Oriented x 3 HEENT: Atraumatic, Normocephalic.  Ears and Nose: No external deformity. EXTR: No clubbing/cyanosis/edema NEURO: Normal gait.  PSYCH: Normally interactive. Conversant. Not depressed or anxious appearing.  Calm demeanor.    Right hand and wrist: Nontender throughout all fingers and metacarpals. There is no tenderness along the entirety of the radius and ulna. Nontender in the snuffbox region. Patient does have some fullness in the dorsum of his true wrist. There is pain with axial loading as well as ulnar and radial deviation.  Right knee has full extension. Flexion to 100. Mild effusion. No significant tenderness at the joint line, and patient does have a positive flexion pinch test, but no tenderness with McMurray's test. No mechanical symptoms. Anterior cruciate ligament, PCL, MCL, and LCL are intact.  Radiology: No results found.  Assessment and Plan:   Right wrist pain - Plan: DG Wrist Complete Right  Acute pain of right knee - Plan: DG Knee Complete 4 Views Right  Fall, initial encounter - Plan: DG Wrist Complete Right, DG Knee Complete 4 Views Right  Date of injury 07/09/2016.  There appeared to be no acute fractures on x-rays. Likely ligamentous disruption in the right wrist. Place the patient in a cockup wrist splint. Ice when necessary.  Right knee, relative rest over the next 1-2 weeks. Icing. Return activity as tolerated.  If his symptoms are not resolved in 3-4 weeks, follow-up in the office with me.  Follow-up: No Follow-up on file.  Orders Placed This Encounter  Procedures  . DG Wrist Complete Right  . DG Knee Complete 4 Views Right    Signed,  Frederico Hamman T. Dhamar Gregory, MD   Allergies as of 07/11/2016   No Known Allergies     Medication List       Accurate as of 07/11/16  1:29 PM. Always use your most recent med list.          aspirin 81 MG tablet Take 1 tablet (81 mg  total) by mouth daily.   Broccoli Extract 500 MG Tabs Take 300 mg by mouth.   [employer] SUPER GREEN TEA EXTRACT PO Take 60 mg by mouth.   ketoconazole 2 % cream Commonly known as:  NIZORAL Apply 1 application topically daily.   leuprolide 3.75 MG injection Commonly known as:  LUPRON Inject 3.75 mg into the muscle every 28 (twenty-eight) days.   losartan 25 MG tablet Commonly known as:  COZAAR TAKE ONE TABLET BY MOUTH ONCE DAILY   metFORMIN 500 MG 24 hr tablet Commonly known as:  GLUCOPHAGE-XR Take 1 tablet (500 mg total) by mouth daily with breakfast.   pravastatin 40 MG tablet Commonly known as:  PRAVACHOL TAKE ONE TABLET BY MOUTH ONCE DAILY   sildenafil 20 MG tablet Commonly known  as:  REVATIO Take 2 to 5 tablets by mouth 30 minutes prior to intercourse   TURMERIC PO   UNABLE TO FIND Take 300 mg by mouth. pomegranite   Vitamin D3 2000 units capsule Take by mouth.

## 2016-07-11 NOTE — Progress Notes (Signed)
Pre visit review using our clinic review tool, if applicable. No additional management support is needed unless otherwise documented below in the visit note. 

## 2016-08-01 ENCOUNTER — Encounter: Payer: Self-pay | Admitting: Oncology

## 2016-08-03 ENCOUNTER — Ambulatory Visit (HOSPITAL_BASED_OUTPATIENT_CLINIC_OR_DEPARTMENT_OTHER): Payer: Medicare Other

## 2016-08-03 ENCOUNTER — Ambulatory Visit: Payer: Medicare Other

## 2016-08-03 DIAGNOSIS — C61 Malignant neoplasm of prostate: Secondary | ICD-10-CM

## 2016-08-03 DIAGNOSIS — Z5111 Encounter for antineoplastic chemotherapy: Secondary | ICD-10-CM

## 2016-08-03 MED ORDER — LEUPROLIDE ACETATE 7.5 MG IM KIT
7.5000 mg | PACK | Freq: Once | INTRAMUSCULAR | Status: AC
  Administered 2016-08-03: 7.5 mg via INTRAMUSCULAR
  Filled 2016-08-03: qty 7.5

## 2016-08-03 NOTE — Patient Instructions (Signed)

## 2016-08-22 LAB — HM DIABETES EYE EXAM

## 2016-08-23 ENCOUNTER — Encounter: Payer: Self-pay | Admitting: Oncology

## 2016-08-23 DIAGNOSIS — C61 Malignant neoplasm of prostate: Secondary | ICD-10-CM | POA: Diagnosis not present

## 2016-09-06 ENCOUNTER — Ambulatory Visit (HOSPITAL_BASED_OUTPATIENT_CLINIC_OR_DEPARTMENT_OTHER): Payer: Medicare Other

## 2016-09-06 ENCOUNTER — Telehealth: Payer: Self-pay | Admitting: Oncology

## 2016-09-06 ENCOUNTER — Ambulatory Visit (HOSPITAL_BASED_OUTPATIENT_CLINIC_OR_DEPARTMENT_OTHER): Payer: Medicare Other | Admitting: Oncology

## 2016-09-06 VITALS — BP 131/65 | HR 55 | Temp 97.9°F | Resp 17 | Ht 71.0 in | Wt 192.3 lb

## 2016-09-06 DIAGNOSIS — C61 Malignant neoplasm of prostate: Secondary | ICD-10-CM

## 2016-09-06 DIAGNOSIS — Z5111 Encounter for antineoplastic chemotherapy: Secondary | ICD-10-CM

## 2016-09-06 MED ORDER — LEUPROLIDE ACETATE 7.5 MG IM KIT
7.5000 mg | PACK | Freq: Once | INTRAMUSCULAR | Status: AC
  Administered 2016-09-06: 7.5 mg via INTRAMUSCULAR
  Filled 2016-09-06: qty 7.5

## 2016-09-06 NOTE — Telephone Encounter (Signed)
Scheduled appt per 6/28 los - Gave patient AVS and calender per los.  

## 2016-09-06 NOTE — Patient Instructions (Signed)
Leuprolide depot injection What is this medicine? LEUPROLIDE (loo PROE lide) is a man-made protein that acts like a natural hormone in the body. It decreases testosterone in men and decreases estrogen in women. In men, this medicine is used to treat advanced prostate cancer. In women, some forms of this medicine may be used to treat endometriosis, uterine fibroids, or other male hormone-related problems. This medicine may be used for other purposes; ask your health care provider or pharmacist if you have questions. COMMON BRAND NAME(S): Eligard, Lupron Depot, Lupron Depot-Ped, Viadur What should I tell my health care provider before I take this medicine? They need to know if you have any of these conditions: -diabetes -heart disease or previous heart attack -high blood pressure -high cholesterol -mental illness -osteoporosis -pain or difficulty passing urine -seizures -spinal cord metastasis -stroke -suicidal thoughts, plans, or attempt; a previous suicide attempt by you or a family member -tobacco smoker -unusual vaginal bleeding (women) -an unusual or allergic reaction to leuprolide, benzyl alcohol, other medicines, foods, dyes, or preservatives -pregnant or trying to get pregnant -breast-feeding How should I use this medicine? This medicine is for injection into a muscle or for injection under the skin. It is given by a health care professional in a hospital or clinic setting. The specific product will determine how it will be given to you. Make sure you understand which product you receive and how often you will receive it. Talk to your pediatrician regarding the use of this medicine in children. Special care may be needed. Overdosage: If you think you have taken too much of this medicine contact a poison control center or emergency room at once. NOTE: This medicine is only for you. Do not share this medicine with others. What if I miss a dose? It is important not to miss a dose.  Call your doctor or health care professional if you are unable to keep an appointment. Depot injections: Depot injections are given either once-monthly, every 12 weeks, every 16 weeks, or every 24 weeks depending on the product you are prescribed. The product you are prescribed will be based on if you are male or male, and your condition. Make sure you understand your product and dosing. What may interact with this medicine? Do not take this medicine with any of the following medications: -chasteberry This medicine may also interact with the following medications: -herbal or dietary supplements, like black cohosh or DHEA -male hormones, like estrogens or progestins and birth control pills, patches, rings, or injections -male hormones, like testosterone This list may not describe all possible interactions. Give your health care provider a list of all the medicines, herbs, non-prescription drugs, or dietary supplements you use. Also tell them if you smoke, drink alcohol, or use illegal drugs. Some items may interact with your medicine. What should I watch for while using this medicine? Visit your doctor or health care professional for regular checks on your progress. During the first weeks of treatment, your symptoms may get worse, but then will improve as you continue your treatment. You may get hot flashes, increased bone pain, increased difficulty passing urine, or an aggravation of nerve symptoms. Discuss these effects with your doctor or health care professional, some of them may improve with continued use of this medicine. Male patients may experience a menstrual cycle or spotting during the first months of therapy with this medicine. If this continues, contact your doctor or health care professional. What side effects may I notice from receiving this medicine? Side   effects that you should report to your doctor or health care professional as soon as possible: -allergic reactions like skin  rash, itching or hives, swelling of the face, lips, or tongue -breathing problems -chest pain -depression or memory disorders -pain in your legs or groin -pain at site where injected or implanted -seizures -severe headache -swelling of the feet and legs -suicidal thoughts or other mood changes -visual changes -vomiting Side effects that usually do not require medical attention (report to your doctor or health care professional if they continue or are bothersome): -breast swelling or tenderness -decrease in sex drive or performance -diarrhea -hot flashes -loss of appetite -muscle, joint, or bone pains -nausea -redness or irritation at site where injected or implanted -skin problems or acne This list may not describe all possible side effects. Call your doctor for medical advice about side effects. You may report side effects to FDA at 1-800-FDA-1088. Where should I keep my medicine? This drug is given in a hospital or clinic and will not be stored at home. NOTE: This sheet is a summary. It may not cover all possible information. If you have questions about this medicine, talk to your doctor, pharmacist, or health care provider.  2018 Elsevier/Gold Standard (2015-08-11 09:45:53)  

## 2016-09-06 NOTE — Progress Notes (Signed)
Hematology and Oncology Follow Up Visit  Piotr Christopher [id number] 1940-10-20 76 y.o. 09/06/2016 10:25 AM Bedsole, [name], [name], [name], MD   Principle Diagnosis: 76 year old gentleman with the prostate cancer diagnosed in 2009 he had a Gleason score 3+3 = 6 PSA of 3.17. He subsequently developed more aggressive cancer Gleason score 4+4 = 8, PSA 8.6 in 2011.   Prior Therapy: Observation and surveillance between 2009 in 2011. He was found to have a Gleason score 4+4 = 8 in 2011 and PSA of . He subsequently was evaluated the [facility] for proton therapy. He received radiation therapy between October 2011 until December 2011. He received concomitant Taxotere chemotherapy at a low dose but it was unclear to me whether he had it as a part of a clinical trial. He also received 6 months of hormone therapy up until July 2012. His PSA nadir was down to 0.1 In December 2013. His PSA continue to be low close to 0.1 and 0.2 Up till December 2015.  In June 2016 was up to 0.4 and his most recent PSA in September 2016 was up to 0.7.    Current therapy: Androgen deprivation therapy with Lupron at 7.5 mg on a monthly basis started in March 2018.  Interim History:  Mr. Outman presents today for a follow-up visit. Since the last visit, he reports no major changes in his health. He continues to receive Lupron and has tolerated it reasonably well without complications. He did report hot flashes and had been tolerable. He remains active and has resumed most activities of daily living. He reports no new complaints such as dysuria or hematuria. He does not report any arthralgias or myalgias. He denied any recent hospitalization or illnesses.  He does not report any headaches, blurry vision, seizures. He denies any unsteadiness or syncope. Does not report any fevers, chills or sweats. Does not report any cough or hemoptysis. Does not report any chest pain, palpitation, orthopnea or leg edema. He  denied any nausea, vomiting or abdominal pain. Denies any frequency urgency or hesitancy. Remaining review of systems unremarkable.  Medications: I have reviewed the patient's current medications.  Current Outpatient Prescriptions  Medication Sig Dispense Refill  . aspirin 81 MG tablet Take 1 tablet (81 mg total) by mouth daily. 30 tablet   . Broccoli Extract 500 MG TABS Take 300 mg by mouth.    . Cholecalciferol (VITAMIN D3) 2000 units capsule Take by mouth.    [place] Cowden Tea, Camillia sinensis, (CVS SUPER GREEN TEA EXTRACT PO) Take 60 mg by mouth.    Marland Kitchen ketoconazole (NIZORAL) 2 % cream Apply 1 application topically daily. 15 g 0  . leuprolide (LUPRON) 3.75 MG injection Inject 3.75 mg into the muscle every 28 (twenty-eight) days.    Marland Kitchen losartan (COZAAR) 25 MG tablet TAKE ONE TABLET BY MOUTH ONCE DAILY 90 tablet 1  . metFORMIN (GLUCOPHAGE-XR) 500 MG 24 hr tablet Take 1 tablet (500 mg total) by mouth daily with breakfast. 90 tablet 3  . pravastatin (PRAVACHOL) 40 MG tablet TAKE ONE TABLET BY MOUTH ONCE DAILY 90 tablet 2  . sildenafil (REVATIO) 20 MG tablet Take 2 to 5 tablets by mouth 30 minutes prior to intercourse 30 tablet 2  . TURMERIC PO     . UNABLE TO FIND Take 300 mg by mouth. pomegranite     No current facility-administered medications for this visit.    Facility-Administered Medications Ordered in Other Visits  Medication Dose Route Frequency Provider Last Rate  Last Dose  . leuprolide (LUPRON) injection 7.5 mg  7.5 mg Intramuscular Once Daena Alper, Mathis Dad, MD         Allergies: No Known Allergies  Past Medical History, Surgical history, Social history, and Family History were reviewed and updated.  Blood pressure 131/65, pulse (!) 55, temperature 97.9 F (36.6 C), temperature source Oral, resp. rate 17, height 5\' 11"  (1.803 m), weight 192 lb 4.8 oz (87.2 kg), SpO2 100 %. ECOG 0.  General appearance: Alert, awake gentleman without distress.  Head: Normocephalic, without obvious  abnormality.  Neck: no adenopathy or masses. Resp: clear to auscultation bilaterally no wheezes or rales to percussion.  Chest wall: no tenderness or masses. Cardio: regular rate and rhythm, S1, S2.  Abdomen: Soft, nontender without rebound or guarding. No shifting dullness or ascites. Extremities: extremities normal, atraumatic, no cyanosis or edema Pulses: 2+ and symmetric Skin: Skin color, texture, turgor normal. No rashes or lesions Lymph nodes: Cervical, supraclavicular, and axillary nodes normal.   PSA on 04/20/2015 was 2.6.  PSA on 07/19/2015 was 3.7. PSA on 10/18/2015 was 3.2. PSA on 01/11/2016 was 4.4. PSA on 04/12/2016 was 8.2. Lupron started in March 2018. PSA on 08/23/2016 was 0.7  Impression and Plan:   76 year old gentleman with the following issues:  1. Prostate cancer initially diagnosed in 2009 with a Gleason score 3+3 = 6 and a PSA of 3.17. He was treated with a definitive hormonal therapy utilizing proton therapy at the [facility] in October 2011. He received a 6 months of hormone therapy that was completed in July 2012. His PSA nadir down to 0.1.   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  He is currently on Lupron at 7.5 mg monthly and has tolerated it relatively well. His PSA in June 2018 show excellent response down to 0.7. The plan is to continue with monthly injection for a total of 6 months and then give it intermittently after that. He denied any complications related to Lupron and willing to continue on it at this time.  2. Follow-up: Will be 2 months for a clinical visit.    [facility], MD 6/28/201810:25 AM

## 2016-09-17 ENCOUNTER — Other Ambulatory Visit (INDEPENDENT_AMBULATORY_CARE_PROVIDER_SITE_OTHER): Payer: Medicare Other

## 2016-09-17 ENCOUNTER — Telehealth: Payer: Self-pay | Admitting: Family Medicine

## 2016-09-17 DIAGNOSIS — E1140 Type 2 diabetes mellitus with diabetic neuropathy, unspecified: Secondary | ICD-10-CM | POA: Diagnosis not present

## 2016-09-17 LAB — COMPREHENSIVE METABOLIC PANEL
ALBUMIN: 3.7 g/dL (ref 3.5–5.2)
ALK PHOS: 36 U/L — AB (ref 39–117)
ALT: 16 U/L (ref 0–53)
AST: 13 U/L (ref 0–37)
BILIRUBIN TOTAL: 0.6 mg/dL (ref 0.2–1.2)
BUN: 18 mg/dL (ref 6–23)
CO2: 30 mEq/L (ref 19–32)
Calcium: 9.2 mg/dL (ref 8.4–10.5)
Chloride: 105 mEq/L (ref 96–112)
Creatinine, Ser: 0.91 mg/dL (ref 0.40–1.50)
GFR: 86.18 mL/min (ref >60.00)
GLUCOSE: 173 mg/dL — AB (ref 70–99)
POTASSIUM: 4.1 meq/L (ref 3.5–5.1)
Sodium: 140 mEq/L (ref 135–145)
TOTAL PROTEIN: 6.4 g/dL (ref 6.0–8.3)

## 2016-09-17 LAB — LIPID PANEL
CHOLESTEROL: 160 mg/dL (ref 0–200)
HDL: 51 mg/dL (ref >39.00)
LDL Cholesterol: 97 mg/dL (ref 0–99)
NONHDL: 108.9
Total CHOL/HDL Ratio: 3
Triglycerides: 62 mg/dL (ref 0.0–149.0)
VLDL: 12.4 mg/dL (ref 0.0–40.0)

## 2016-09-17 LAB — HEMOGLOBIN A1C: HEMOGLOBIN A1C: 7.5 % — AB (ref 4.6–6.5)

## 2016-09-17 NOTE — Telephone Encounter (Signed)
-----   Message from Ellamae Sia sent at 09/11/2016  8:24 AM EDT ----- Regarding: LAB ORDERS FOR MONDAY 09/17/16 LAB ORDERS FOR FOLLOW UP APPOINTMENT.

## 2016-09-20 ENCOUNTER — Encounter: Payer: Self-pay | Admitting: Family Medicine

## 2016-09-20 ENCOUNTER — Ambulatory Visit (INDEPENDENT_AMBULATORY_CARE_PROVIDER_SITE_OTHER): Payer: Medicare Other | Admitting: Family Medicine

## 2016-09-20 DIAGNOSIS — E1142 Type 2 diabetes mellitus with diabetic polyneuropathy: Secondary | ICD-10-CM | POA: Diagnosis not present

## 2016-09-20 DIAGNOSIS — I10 Essential (primary) hypertension: Secondary | ICD-10-CM | POA: Diagnosis not present

## 2016-09-20 DIAGNOSIS — E7800 Pure hypercholesterolemia, unspecified: Secondary | ICD-10-CM

## 2016-09-20 DIAGNOSIS — E1140 Type 2 diabetes mellitus with diabetic neuropathy, unspecified: Secondary | ICD-10-CM

## 2016-09-20 LAB — HM DIABETES FOOT EXAM

## 2016-09-20 NOTE — Assessment & Plan Note (Signed)
Well controlled. Continue current medication.  

## 2016-09-20 NOTE — Assessment & Plan Note (Signed)
Tolerable on no medication. 

## 2016-09-20 NOTE — Assessment & Plan Note (Signed)
Inadequate control.. Will increase exercsie and lower chol in diet.  Goal < 70.  May need to increase statin.

## 2016-09-20 NOTE — Assessment & Plan Note (Signed)
Inadequate control but has not been sticking to diet.. Should complete lupro in next 6 months as well.  Continue metformin and re-eval in 6 months.

## 2016-09-20 NOTE — Patient Instructions (Addendum)
Get back on track with low carb diet.  Check blood sugar in morning. Goal is fasting blood sugar < 120, ideally <100.  Get back on rack with exercise. Can use immodium on trip for stool issue.

## 2016-09-20 NOTE — Progress Notes (Signed)
   Subjective:    Patient ID: Alexander Yang, male    DOB: 1941/02/20, 76 y.o.   MRN: [id number]  HPI   76 year old male presents for follow up DM.     Prostate cancer.. On lupron, has 2 more month.. Makes him tired and hungry. Gives him issues with waking in middle of night. Hot flashes.   Ever since prostate radiation.. Softer paste stools, 5-6 each day.  If going somewhere uses immodium... This helps temporarily  Diabetes:   Inadequate control on metformin low dose. Not sticking on low carb eating. Lab Results  Component Value Date   HGBA1C 7.5 (H) 09/17/2016  Using medications without difficulties: Hypoglycemic episodes: Hyperglycemic episodes: Feet problems: neuropathy, stable control, tolerable Blood Sugars averaging: not checking eye exam within last year: yes  .Hypertension:   Good control on losartan BP Readings from Last 3 Encounters:  09/20/16 122/80  09/06/16 131/65  07/11/16 130/80  Using medication without problems or lightheadedness:  none Chest pain with exertion:none Edema:noen Short of breath: none Average home BPs: good Other issues:  Elevated Cholesterol:  LDL almost  at goal < 70 on pravastatin 40 mg daily  hx of  CVA Lab Results  Component Value Date   CHOL 160 09/17/2016   HDL 51.00 09/17/2016   LDLCALC 97 09/17/2016   TRIG 62.0 09/17/2016   CHOLHDL 3 09/17/2016  Using medications without problems: Muscle aches:  Diet compliance: poor Exercise: none Other complaints:   Review of Systems  Constitutional: Negative for fatigue and fever.  HENT: Negative for ear pain.   Eyes: Negative for pain.  Respiratory: Negative for cough and shortness of breath.   Cardiovascular: Negative for chest pain, palpitations and leg swelling.  Gastrointestinal: Negative for abdominal pain.  Genitourinary: Negative for dysuria.  Musculoskeletal: Negative for arthralgias.  Neurological: Negative for syncope, light-headedness and headaches.    Psychiatric/Behavioral: Negative for dysphoric mood.       Objective:   Physical Exam  Constitutional: Vital signs are normal. He appears well-developed and well-nourished.  HENT:  Head: Normocephalic.  Right Ear: Hearing normal.  Left Ear: Hearing normal.  Nose: Nose normal.  Mouth/Throat: Oropharynx is clear and moist and mucous membranes are normal.  Neck: Trachea normal. Carotid bruit is not present. No thyroid mass and no thyromegaly present.  Cardiovascular: Normal rate, regular rhythm and normal pulses.  Exam reveals no gallop, no distant heart sounds and no friction rub.   No murmur heard. No peripheral edema  Pulmonary/Chest: Effort normal and breath sounds normal. No respiratory distress.  Skin: Skin is warm, dry and intact. No rash noted.  Psychiatric: He has a normal mood and affect. His speech is normal and behavior is normal. Thought content normal.   Diabetic foot exam: Normal inspection No skin breakdown No calluses  Normal DP pulses decreased sensation to light touch and monofilament Nails normal        Assessment & Plan:

## 2016-09-21 NOTE — Progress Notes (Signed)
Order(s) created erroneously. Erroneous order ID: [id number]  Order moved by: Berneice Gandy  Order move date/time: 09/21/2016 5:24 PM  Source Patient: [id number]  Source Contact: 09/20/2016  Destination Patient: [id number]  Destination Contact: 05/27/2012

## 2016-09-27 DIAGNOSIS — L905 Scar conditions and fibrosis of skin: Secondary | ICD-10-CM | POA: Diagnosis not present

## 2016-09-27 DIAGNOSIS — L814 Other melanin hyperpigmentation: Secondary | ICD-10-CM | POA: Diagnosis not present

## 2016-09-27 DIAGNOSIS — L821 Other seborrheic keratosis: Secondary | ICD-10-CM | POA: Diagnosis not present

## 2016-09-27 DIAGNOSIS — D1801 Hemangioma of skin and subcutaneous tissue: Secondary | ICD-10-CM | POA: Diagnosis not present

## 2016-10-01 ENCOUNTER — Encounter: Payer: Self-pay | Admitting: Family Medicine

## 2016-10-01 ENCOUNTER — Ambulatory Visit (HOSPITAL_BASED_OUTPATIENT_CLINIC_OR_DEPARTMENT_OTHER): Payer: Medicare Other

## 2016-10-01 VITALS — BP 150/70 | HR 63 | Temp 98.1°F | Resp 18

## 2016-10-01 DIAGNOSIS — C61 Malignant neoplasm of prostate: Secondary | ICD-10-CM

## 2016-10-01 DIAGNOSIS — Z5111 Encounter for antineoplastic chemotherapy: Secondary | ICD-10-CM | POA: Diagnosis present

## 2016-10-01 MED ORDER — LEUPROLIDE ACETATE 7.5 MG IM KIT
7.5000 mg | PACK | Freq: Once | INTRAMUSCULAR | Status: AC
  Administered 2016-10-01: 7.5 mg via INTRAMUSCULAR
  Filled 2016-10-01: qty 7.5

## 2016-10-01 NOTE — Patient Instructions (Signed)

## 2016-10-16 ENCOUNTER — Encounter: Payer: Self-pay | Admitting: Family Medicine

## 2016-10-19 DIAGNOSIS — C61 Malignant neoplasm of prostate: Secondary | ICD-10-CM | POA: Diagnosis not present

## 2016-11-02 ENCOUNTER — Ambulatory Visit (HOSPITAL_BASED_OUTPATIENT_CLINIC_OR_DEPARTMENT_OTHER): Payer: Medicare Other | Admitting: Oncology

## 2016-11-02 ENCOUNTER — Ambulatory Visit (HOSPITAL_BASED_OUTPATIENT_CLINIC_OR_DEPARTMENT_OTHER): Payer: Medicare Other

## 2016-11-02 ENCOUNTER — Telehealth: Payer: Self-pay | Admitting: Oncology

## 2016-11-02 VITALS — BP 161/78 | HR 62 | Temp 98.7°F | Resp 20 | Ht 71.25 in | Wt 191.9 lb

## 2016-11-02 DIAGNOSIS — E291 Testicular hypofunction: Secondary | ICD-10-CM | POA: Diagnosis not present

## 2016-11-02 DIAGNOSIS — C61 Malignant neoplasm of prostate: Secondary | ICD-10-CM | POA: Diagnosis not present

## 2016-11-02 DIAGNOSIS — Z5111 Encounter for antineoplastic chemotherapy: Secondary | ICD-10-CM | POA: Diagnosis present

## 2016-11-02 MED ORDER — LEUPROLIDE ACETATE 7.5 MG IM KIT
7.5000 mg | PACK | Freq: Once | INTRAMUSCULAR | Status: AC
  Administered 2016-11-02: 7.5 mg via INTRAMUSCULAR
  Filled 2016-11-02: qty 7.5

## 2016-11-02 NOTE — Progress Notes (Signed)
Hematology and Oncology Follow Up Visit  Alexander Yang [id number] 12/04/1940 76 y.o. 11/02/2016 12:01 PM Bedsole, [name], [name], [name], MD   Principle Diagnosis: 76 year old gentleman with the prostate cancer diagnosed in 2009 he had a Gleason score 3+3 = 6 PSA of 3.17. He subsequently developed more aggressive cancer Gleason score 4+4 = 8, PSA 8.6 in 2011.   Prior Therapy: Observation and surveillance between 2009 in 2011. He was found to have a Gleason score 4+4 = 8 in 2011 and PSA of . He subsequently was evaluated the [facility] for proton therapy. He received radiation therapy between October 2011 until December 2011. He received concomitant Taxotere chemotherapy at a low dose but it was unclear to me whether he had it as a part of a clinical trial. He also received 6 months of hormone therapy up until July 2012. His PSA nadir was down to 0.1 In December 2013. His PSA continue to be low close to 0.1 and 0.2 Up till December 2015.  In June 2016 was up to 0.4 and his most recent PSA in September 2016 was up to 0.7.    Current therapy: Androgen deprivation therapy with Lupron at 7.5 mg on a monthly basis started in March 2018.  Interim History:  Mr. Macnaughton presents today for a follow-up visit. Since the last visit, he reports few side effects associated with Lupron. These include hot flashes and mild fatigue that is not interfering with his activities of daily living. He reports no new complaints such as dysuria or hematuria. He does not report any arthralgias or myalgias. He denied any recent hospitalization or illnesses. He denies any weight loss or appetite changes. His weight has been relatively stable. He denied any pathological fractures or bone pain.  He does not report any headaches, blurry vision, seizures. He denies any unsteadiness or syncope. Does not report any fevers, chills or sweats. Does not report any cough or hemoptysis. Does not report any chest pain,  palpitation, orthopnea or leg edema. He denied any nausea, vomiting or abdominal pain. Denies any frequency urgency or hesitancy. Remaining review of systems unremarkable.  Medications: I have reviewed the patient's current medications.  Current Outpatient Prescriptions  Medication Sig Dispense Refill  . aspirin 81 MG tablet Take 1 tablet (81 mg total) by mouth daily. 30 tablet   . Broccoli Extract 500 MG TABS Take 300 mg by mouth.    . Cholecalciferol (VITAMIN D3) 2000 units capsule Take by mouth.    Nyoka Cowden Tea, Camillia sinensis, ([employer] SUPER GREEN TEA EXTRACT PO) Take 60 mg by mouth.    Marland Kitchen ketoconazole (NIZORAL) 2 % cream Apply 1 application topically daily as needed for irritation.    Marland Kitchen leuprolide (LUPRON) 3.75 MG injection Inject 3.75 mg into the muscle every 28 (twenty-eight) days.    Marland Kitchen losartan (COZAAR) 25 MG tablet TAKE ONE TABLET BY MOUTH ONCE DAILY 90 tablet 1  . metFORMIN (GLUCOPHAGE-XR) 500 MG 24 hr tablet Take 1 tablet (500 mg total) by mouth daily with breakfast. 90 tablet 3  . pravastatin (PRAVACHOL) 40 MG tablet TAKE ONE TABLET BY MOUTH ONCE DAILY 90 tablet 2  . TURMERIC PO     . UNABLE TO FIND Take 300 mg by mouth. pomegranite     No current facility-administered medications for this visit.    Facility-Administered Medications Ordered in Other Visits  Medication Dose Route Frequency Provider Last Rate Last Dose  . leuprolide (LUPRON) injection 7.5 mg  7.5 mg Intramuscular  Once Wyatt Portela, MD         Allergies: No Known Allergies  Past Medical History, Surgical history, Social history, and Family History were reviewed and updated.  Blood pressure (!) 161/78, pulse 62, temperature 98.7 F (37.1 C), temperature source Oral, resp. rate 20, height 5' 11.25" (1.81 m), weight 191 lb 14.4 oz (87 kg), SpO2 100 %. ECOG 0.  General appearance: Well-appearing gentleman without distress.  Head: Normocephalic, without obvious abnormality.  Neck: no adenopathy or masses. Resp:  clear to auscultation bilaterally no wheezes or rales to percussion.  Chest wall: no tenderness or masses. Cardio: regular rate and rhythm, S1, S2.  Abdomen: Soft, nontender without rebound or guarding. No shifting dullness or ascites. Extremities: extremities normal, atraumatic, no cyanosis or edema Pulses: 2+ and symmetric Skin: Skin color, texture, turgor normal. No rashes or lesions Lymph nodes: Cervical, supraclavicular, and axillary nodes normal.   PSA on 04/20/2015 was 2.6.  PSA on 07/19/2015 was 3.7. PSA on 10/18/2015 was 3.2. PSA on 01/11/2016 was 4.4. PSA on 04/12/2016 was 8.2. Lupron started in March 2018. PSA on 08/23/2016 was 0.7 PSA in August 2018 was 0.7.  Impression and Plan:   76 year old gentleman with the following issues:  1. Prostate cancer initially diagnosed in 2009 with a Gleason score 3+3 = 6 and a PSA of 3.17. He was treated with a definitive hormonal therapy utilizing proton therapy at the [facility] in October 2011. He received a 6 months of hormone therapy that was completed in July 2012. His PSA nadir down to 0.1.   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  He is currently on Lupron at 7.5 mg monthly and has tolerated it relatively well.   His PSA in  August 2018 continue to show decline around 0.7. The plan is to continue with the Lupron on a monthly basis for 2 more months. I will obtain a PSA and testosterone before the next visit. 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  2. Follow-up: Will be 2 months for a clinical visit.    Zola Button, MD 8/24/201812:01 PM

## 2016-11-02 NOTE — Telephone Encounter (Signed)
Gave patient avs report and appointments for September and October  °

## 2016-11-13 ENCOUNTER — Encounter: Payer: Self-pay | Admitting: *Deleted

## 2016-11-13 DIAGNOSIS — M1711 Unilateral primary osteoarthritis, right knee: Secondary | ICD-10-CM | POA: Diagnosis not present

## 2016-11-13 DIAGNOSIS — M170 Bilateral primary osteoarthritis of knee: Secondary | ICD-10-CM | POA: Diagnosis not present

## 2016-11-13 DIAGNOSIS — M1712 Unilateral primary osteoarthritis, left knee: Secondary | ICD-10-CM | POA: Diagnosis not present

## 2016-11-16 ENCOUNTER — Ambulatory Visit (INDEPENDENT_AMBULATORY_CARE_PROVIDER_SITE_OTHER): Payer: Medicare Other | Admitting: Podiatry

## 2016-11-16 ENCOUNTER — Encounter: Payer: Self-pay | Admitting: Podiatry

## 2016-11-16 DIAGNOSIS — M722 Plantar fascial fibromatosis: Secondary | ICD-10-CM | POA: Diagnosis not present

## 2016-11-19 NOTE — Progress Notes (Signed)
   Subjective: Patient presents today for follow up evaluation of worsening pain and tenderness in the left heel for the past three months. Patient states that it hurts in the mornings with the first steps out of bed. Walking also exacerbates his pain. He is requesting an injection for treatment. He has been stretching and wearing the plantar fascial brace with no significant relief of the pain. Patient presents today for further treatment and evaluation  Objective: Physical Exam General: The patient is alert and oriented x3 in no acute distress.  Dermatology: Skin is warm, dry and supple bilateral lower extremities. Negative for open lesions or macerations bilateral.   Vascular: Dorsalis Pedis and Posterior Tibial pulses palpable bilateral.  Capillary fill time is immediate to all digits.  Neurological: Epicritic and protective threshold intact bilateral.   Musculoskeletal: Tenderness to palpation at the medial calcaneal tubercale and through the insertion of the plantar fascia of the left foot. All other joints range of motion within normal limits bilateral. Strength 5/5 in all groups bilateral.   Assessment: 1. Plantar fasciitis left foot  Plan of Care:  1. Patient evaluated. 2. Injection of 0.5cc Celestone soluspan injected into the left plantar fascia.  3. Continue wearing plantar fascial brace and good shoe gear.  4. Return to clinic in 4 weeks.     Edrick Kins, DPM [facility]  Dr. Edrick Kins, DPM    [address], [place] [address]                Office [phone]  Fax [phone]

## 2016-11-20 DIAGNOSIS — M25562 Pain in left knee: Secondary | ICD-10-CM | POA: Diagnosis not present

## 2016-11-21 ENCOUNTER — Encounter: Payer: Self-pay | Admitting: Family Medicine

## 2016-11-22 MED ORDER — BETAMETHASONE SOD PHOS & ACET 6 (3-3) MG/ML IJ SUSP: 3.0000 mg | Freq: Once | INTRAMUSCULAR | Status: DC

## 2016-11-26 ENCOUNTER — Other Ambulatory Visit: Payer: Self-pay | Admitting: Family Medicine

## 2016-11-27 ENCOUNTER — Encounter: Payer: Self-pay | Admitting: Family Medicine

## 2016-11-28 ENCOUNTER — Encounter: Payer: Self-pay | Admitting: Family Medicine

## 2016-11-28 ENCOUNTER — Other Ambulatory Visit: Payer: Medicare Other

## 2016-12-03 DIAGNOSIS — M25562 Pain in left knee: Secondary | ICD-10-CM | POA: Diagnosis not present

## 2016-12-04 ENCOUNTER — Ambulatory Visit (HOSPITAL_BASED_OUTPATIENT_CLINIC_OR_DEPARTMENT_OTHER): Payer: Medicare Other

## 2016-12-04 VITALS — BP 140/74 | HR 65 | Temp 98.1°F | Resp 16

## 2016-12-04 DIAGNOSIS — C61 Malignant neoplasm of prostate: Secondary | ICD-10-CM | POA: Diagnosis not present

## 2016-12-04 DIAGNOSIS — Z5111 Encounter for antineoplastic chemotherapy: Secondary | ICD-10-CM | POA: Diagnosis present

## 2016-12-04 MED ORDER — LEUPROLIDE ACETATE 7.5 MG IM KIT
7.5000 mg | PACK | Freq: Once | INTRAMUSCULAR | Status: AC
  Administered 2016-12-04: 7.5 mg via INTRAMUSCULAR
  Filled 2016-12-04: qty 7.5

## 2016-12-04 NOTE — Patient Instructions (Signed)
Leuprolide depot injection What is this medicine? LEUPROLIDE (loo PROE lide) is a man-made protein that acts like a natural hormone in the body. It decreases testosterone in men and decreases estrogen in women. In men, this medicine is used to treat advanced prostate cancer. In women, some forms of this medicine may be used to treat endometriosis, uterine fibroids, or other male hormone-related problems. This medicine may be used for other purposes; ask your health care provider or pharmacist if you have questions. COMMON BRAND NAME(S): Eligard, Lupron Depot, Lupron Depot-Ped, Viadur What should I tell my health care provider before I take this medicine? They need to know if you have any of these conditions: -diabetes -heart disease or previous heart attack -high blood pressure -high cholesterol -mental illness -osteoporosis -pain or difficulty passing urine -seizures -spinal cord metastasis -stroke -suicidal thoughts, plans, or attempt; a previous suicide attempt by you or a family member -tobacco smoker -unusual vaginal bleeding (women) -an unusual or allergic reaction to leuprolide, benzyl alcohol, other medicines, foods, dyes, or preservatives -pregnant or trying to get pregnant -breast-feeding How should I use this medicine? This medicine is for injection into a muscle or for injection under the skin. It is given by a health care professional in a hospital or clinic setting. The specific product will determine how it will be given to you. Make sure you understand which product you receive and how often you will receive it. Talk to your pediatrician regarding the use of this medicine in children. Special care may be needed. Overdosage: If you think you have taken too much of this medicine contact a poison control center or emergency room at once. NOTE: This medicine is only for you. Do not share this medicine with others. What if I miss a dose? It is important not to miss a dose.  Call your doctor or health care professional if you are unable to keep an appointment. Depot injections: Depot injections are given either once-monthly, every 12 weeks, every 16 weeks, or every 24 weeks depending on the product you are prescribed. The product you are prescribed will be based on if you are male or male, and your condition. Make sure you understand your product and dosing. What may interact with this medicine? Do not take this medicine with any of the following medications: -chasteberry This medicine may also interact with the following medications: -herbal or dietary supplements, like black cohosh or DHEA -male hormones, like estrogens or progestins and birth control pills, patches, rings, or injections -male hormones, like testosterone This list may not describe all possible interactions. Give your health care provider a list of all the medicines, herbs, non-prescription drugs, or dietary supplements you use. Also tell them if you smoke, drink alcohol, or use illegal drugs. Some items may interact with your medicine. What should I watch for while using this medicine? Visit your doctor or health care professional for regular checks on your progress. During the first weeks of treatment, your symptoms may get worse, but then will improve as you continue your treatment. You may get hot flashes, increased bone pain, increased difficulty passing urine, or an aggravation of nerve symptoms. Discuss these effects with your doctor or health care professional, some of them may improve with continued use of this medicine. Male patients may experience a menstrual cycle or spotting during the first months of therapy with this medicine. If this continues, contact your doctor or health care professional. What side effects may I notice from receiving this medicine? Side   effects that you should report to your doctor or health care professional as soon as possible: -allergic reactions like skin  rash, itching or hives, swelling of the face, lips, or tongue -breathing problems -chest pain -depression or memory disorders -pain in your legs or groin -pain at site where injected or implanted -seizures -severe headache -swelling of the feet and legs -suicidal thoughts or other mood changes -visual changes -vomiting Side effects that usually do not require medical attention (report to your doctor or health care professional if they continue or are bothersome): -breast swelling or tenderness -decrease in sex drive or performance -diarrhea -hot flashes -loss of appetite -muscle, joint, or bone pains -nausea -redness or irritation at site where injected or implanted -skin problems or acne This list may not describe all possible side effects. Call your doctor for medical advice about side effects. You may report side effects to FDA at 1-800-FDA-1088. Where should I keep my medicine? This drug is given in a hospital or clinic and will not be stored at home. NOTE: This sheet is a summary. It may not cover all possible information. If you have questions about this medicine, talk to your doctor, pharmacist, or health care provider.  2018 Elsevier/Gold Standard (2015-08-11 09:45:53)  

## 2016-12-05 ENCOUNTER — Encounter: Payer: Self-pay | Admitting: Family Medicine

## 2016-12-10 DIAGNOSIS — Z23 Encounter for immunization: Secondary | ICD-10-CM | POA: Diagnosis not present

## 2016-12-12 ENCOUNTER — Telehealth: Payer: Self-pay | Admitting: Oncology

## 2016-12-12 NOTE — Telephone Encounter (Signed)
Faxed pt medical records to [facility] [phone]

## 2016-12-14 ENCOUNTER — Ambulatory Visit (INDEPENDENT_AMBULATORY_CARE_PROVIDER_SITE_OTHER): Payer: Medicare Other

## 2016-12-14 ENCOUNTER — Ambulatory Visit (INDEPENDENT_AMBULATORY_CARE_PROVIDER_SITE_OTHER): Payer: Medicare Other | Admitting: Podiatry

## 2016-12-14 ENCOUNTER — Ambulatory Visit: Payer: Medicare Other

## 2016-12-14 DIAGNOSIS — M7751 Other enthesopathy of right foot: Secondary | ICD-10-CM | POA: Diagnosis not present

## 2016-12-14 DIAGNOSIS — M79671 Pain in right foot: Secondary | ICD-10-CM

## 2016-12-14 DIAGNOSIS — M778 Other enthesopathies, not elsewhere classified: Secondary | ICD-10-CM

## 2016-12-14 DIAGNOSIS — M779 Enthesopathy, unspecified: Principal | ICD-10-CM

## 2016-12-17 NOTE — Progress Notes (Signed)
   Subjective: Patient presents today for follow up evaluation of worsening pain and tenderness in the left heel and foot. He states the pain has not changed since the last visit. He states the injection he received only helped him until he got home from his last appointment. He also reports a new complaint of pain to the dorsum of the right foot and arch that began about 4 weeks ago. There are no modifying factors noted. He has not done anything to treat pain. Patient presents today for further treatment and evaluation.   Past Medical History:  Diagnosis Date  . Cerebrovascular accident ([place])   . Diabetes mellitus without complication ([place])    controlled with diet  . Hypertension   . 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 Osteoporosis   . Prostate cancer ([place])      Objective: Physical Exam General: The patient is alert and oriented x3 in no acute distress.  Dermatology: Skin is warm, dry and supple bilateral lower extremities. Negative for open lesions or macerations bilateral.   Vascular: Dorsalis Pedis and Posterior Tibial pulses palpable bilateral.  Capillary fill time is immediate to all digits.  Neurological: Epicritic and protective threshold intact bilateral.   Musculoskeletal: Tenderness to palpation at the medial calcaneal tubercale and through the insertion of the plantar fascia of the left foot. Pain with palpation to the dorsal right foot. All other joints range of motion within normal limits bilateral. Strength 5/5 in all groups bilateral.    Radiographic exam:   Normal osseous mineralization. Joint spaces preserved. No fracture/dislocation/boney destruction. Calcaneal spur present with mild thickening of plantar fascia left. No other soft tissue abnormalities or radiopaque foreign bodies.   Assessment: 1. Plantar  fasciitis left foot 2. Capsulitis right midfoot  Plan of Care:  1. Patient evaluated. 2. Injection of 0.5cc Celestone soluspan injected into the right midfoot.  3. Appointment with Liliane Channel for custom molded orthotics. 4. Return to clinic after orthotics are dispensed.     Edrick Kins, DPM [facility]  Dr. Edrick Kins, DPM    [address], [place] [address]                Office [phone]  Fax [phone]

## 2016-12-20 DIAGNOSIS — M722 Plantar fascial fibromatosis: Secondary | ICD-10-CM | POA: Diagnosis not present

## 2016-12-20 DIAGNOSIS — M170 Bilateral primary osteoarthritis of knee: Secondary | ICD-10-CM | POA: Diagnosis not present

## 2016-12-25 ENCOUNTER — Ambulatory Visit: Payer: Medicare Other | Admitting: Orthotics

## 2016-12-25 DIAGNOSIS — M722 Plantar fascial fibromatosis: Secondary | ICD-10-CM

## 2016-12-25 DIAGNOSIS — M79671 Pain in right foot: Secondary | ICD-10-CM

## 2016-12-25 DIAGNOSIS — M79673 Pain in unspecified foot: Secondary | ICD-10-CM

## 2016-12-25 MED ORDER — BETAMETHASONE SOD PHOS & ACET 6 (3-3) MG/ML IJ SUSP: 3.0000 mg | Freq: Once | INTRAMUSCULAR | Status: DC

## 2016-12-25 NOTE — Progress Notes (Signed)
Patient presents with heel spur and tendonitis/capsulitis dorsum area of rt foot.  Plan on arch support for pes cavus foot type and heel punch cushion.Marland KitchenMarland KitchenRichy to fab for Dr Amalia Hailey patient.

## 2016-12-31 DIAGNOSIS — C61 Malignant neoplasm of prostate: Secondary | ICD-10-CM | POA: Diagnosis not present

## 2017-01-04 ENCOUNTER — Telehealth: Payer: Self-pay | Admitting: Oncology

## 2017-01-04 NOTE — Telephone Encounter (Signed)
Lvm advising appt time chg on 10/30 from 1.15 to 8.30am due to [facility].

## 2017-01-07 ENCOUNTER — Encounter: Payer: Self-pay | Admitting: Family Medicine

## 2017-01-08 ENCOUNTER — Encounter: Payer: Self-pay | Admitting: Family Medicine

## 2017-01-08 ENCOUNTER — Telehealth: Payer: Self-pay | Admitting: Oncology

## 2017-01-08 ENCOUNTER — Ambulatory Visit (HOSPITAL_BASED_OUTPATIENT_CLINIC_OR_DEPARTMENT_OTHER): Payer: Medicare Other

## 2017-01-08 ENCOUNTER — Ambulatory Visit (HOSPITAL_BASED_OUTPATIENT_CLINIC_OR_DEPARTMENT_OTHER): Payer: Medicare Other | Admitting: Oncology

## 2017-01-08 VITALS — BP 177/79 | HR 59 | Temp 97.6°F | Resp 17 | Ht 71.25 in | Wt 195.9 lb

## 2017-01-08 DIAGNOSIS — E291 Testicular hypofunction: Secondary | ICD-10-CM | POA: Diagnosis not present

## 2017-01-08 DIAGNOSIS — C61 Malignant neoplasm of prostate: Secondary | ICD-10-CM | POA: Diagnosis not present

## 2017-01-08 DIAGNOSIS — Z5111 Encounter for antineoplastic chemotherapy: Secondary | ICD-10-CM

## 2017-01-08 MED ORDER — LEUPROLIDE ACETATE 7.5 MG IM KIT
7.5000 mg | PACK | Freq: Once | INTRAMUSCULAR | Status: AC
  Administered 2017-01-08: 7.5 mg via INTRAMUSCULAR
  Filled 2017-01-08: qty 7.5

## 2017-01-08 NOTE — Telephone Encounter (Signed)
Scheduled appt per 10//30 los - Gave patient AVS and calender per los.  

## 2017-01-08 NOTE — Progress Notes (Signed)
Hematology and Oncology Follow Up Visit  Alexander Yang [id number] Aug 02, 1940 76 y.o. 01/08/2017 9:21 AM Yang, Alexander E, [name], Alexander E, MD   Principle Diagnosis: 76 year old gentleman with the prostate cancer diagnosed in 2009 he had a Gleason score 3+3 = 6 PSA of 3.17. He subsequently developed more aggressive cancer Gleason score 4+4 = 8, PSA 8.6 in 2011.   Prior Therapy: Observation and surveillance between 2009 in 2011. He was found to have a Gleason score 4+4 = 8 in 2011 and PSA of . He subsequently was evaluated the [facility] for proton therapy. He received radiation therapy between October 2011 until December 2011. He received concomitant Taxotere chemotherapy at a low dose but it was unclear to me whether he had it as a part of a clinical trial. He also received 6 months of hormone therapy up until July 2012. His PSA nadir was down to 0.1 In December 2013. His PSA continue to be low close to 0.1 and 0.2 Up till December 2015.  In June 2016 was up to 0.4 and his most recent PSA in September 2016 was up to 0.7.    Current therapy: Androgen deprivation therapy with Lupron at 7.5 mg on a monthly basis started in March 2018.  Last injection will be given on January 08, 2017 and will receive it intermittently after that.  Interim History:  Alexander Yang presents today for a follow-up visit. Since the last visit, he reports no major changes his health.  He continues to tolerate Lupron without any new side effects.  He continues to report hot flashes which are manageable.Marland Kitchen He reports no new complaints such as dysuria or hematuria. He does not report any arthralgias or myalgias. He denied any recent hospitalization or illnesses. He denies any weight loss or appetite changes. His weight has been relatively stable. He denied any pathological fractures or bone pain.  He reports very mild diarrhea that is manageable with Imodium.  He does not report any headaches, blurry vision,  seizures. He denies any unsteadiness or syncope. Does not report any fevers, chills or sweats. Does not report any cough or hemoptysis. Does not report any chest pain, palpitation, orthopnea or leg edema. He denied any nausea, vomiting or abdominal pain. Denies any frequency urgency or hesitancy. Remaining review of systems unremarkable.  Medications: I have reviewed the patient's current medications.  Current Outpatient Prescriptions  Medication Sig Dispense Refill  . aspirin 81 MG tablet Take 1 tablet (81 mg total) by mouth daily. 30 tablet   . Broccoli Extract 500 MG TABS Take 300 mg by mouth.    . Cholecalciferol (VITAMIN D3) 2000 units capsule Take by mouth.    Alexander Yang, Camillia sinensis, ([employer] SUPER GREEN Yang EXTRACT PO) Take 60 mg by mouth.    Marland Kitchen ketoconazole (NIZORAL) 2 % cream Apply 1 application topically daily as needed for irritation.    Marland Kitchen leuprolide (LUPRON) 3.75 MG injection Inject 3.75 mg into the muscle every 28 (twenty-eight) days.    Marland Kitchen losartan (COZAAR) 25 MG tablet TAKE 1 TABLET BY MOUTH ONCE DAILY 90 tablet 1  . metFORMIN (GLUCOPHAGE-XR) 500 MG 24 hr tablet Take 1 tablet (500 mg total) by mouth daily with breakfast. 90 tablet 3  . pravastatin (PRAVACHOL) 40 MG tablet TAKE ONE TABLET BY MOUTH ONCE DAILY 90 tablet 3  . TURMERIC PO     . UNABLE TO FIND Take 300 mg by mouth. pomegranite     Current Facility-Administered Medications  Medication Dose  Route Frequency Provider Last Rate Last Dose  . betamethasone acetate-betamethasone sodium phosphate (CELESTONE) injection 3 mg  3 mg Intramuscular Once Daylene Katayama M, DPM      . betamethasone acetate-betamethasone sodium phosphate (CELESTONE) injection 3 mg  3 mg Intramuscular Once Edrick Kins, DPM       Facility-Administered Medications Ordered in Other Visits  Medication Dose Route Frequency Provider Last Rate Last Dose  . leuprolide (LUPRON) injection 7.5 mg  7.5 mg Intramuscular Once Alexander Yang, Alexander Dad, MD          Allergies: No Known Allergies  Past Medical History, Surgical history, Social history, and Family History were reviewed and updated.  Blood pressure (!) 177/79, pulse (!) 59, temperature 97.6 F (36.4 C), temperature source Oral, resp. rate 17, height 5' 11.25" (1.81 m), weight 195 lb 14.4 oz (88.9 kg), SpO2 100 %. ECOG 0.  General appearance: Alert, awake gentleman without distress. Head: No oral thrush or ulcers. Neck: no adenopathy or masses. Resp: clear to auscultation bilaterally no wheezes or rales to percussion.  Chest wall: no tenderness or masses. Cardio: regular rate and rhythm, S1, S2.  Abdomen: Soft, nontender without rebound or guarding. No shifting dullness or ascites. Extremities: extremities normal, atraumatic, no cyanosis or edema Pulses: 2+ and symmetric Skin: Skin color, texture, turgor normal. No rashes or lesions Lymph nodes: Cervical, supraclavicular, and axillary nodes normal.   PSA on 04/20/2015 was 2.6.  PSA on 07/19/2015 was 3.7. PSA on 10/18/2015 was 3.2. PSA on 01/11/2016 was 4.4. PSA on 04/12/2016 was 8.2. Lupron started in March 2018. PSA on 08/23/2016 was 0.7 PSA in August 2018 was 0.7. PSA in 12/2016 0.7.  Testosterone was less than 3.  Impression and Plan:   76 year old gentleman with the following issues:  1. Prostate cancer initially diagnosed in 2009 with a Gleason score 3+3 = 6 and a PSA of 3.17. He was treated with a definitive hormonal therapy utilizing proton therapy at the [facility] in October 2011. He received a 6 months of hormone therapy that was completed in July 2012. His PSA nadir down to 0.1.   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  He is currently on Lupron at 7.5 mg monthly and has tolerated it relatively well.   PSA in October 2018 as well as testosterone was reviewed and discussed with the patient today.  His PSA appears to be very low with castrate level testosterone.  We have  discussed the rationale and risks and benefits of intermittent androgen deprivation.  After discussion today, he prefers to have a period without ADT and potentially resume his androgen deprivation in the future when his PSA starts to rise.  2. Follow-up: Will be 3 months for a clinical visit.    Zola Button, MD 10/30/20189:21 AM

## 2017-01-15 ENCOUNTER — Encounter: Payer: Self-pay | Admitting: Podiatry

## 2017-01-15 ENCOUNTER — Ambulatory Visit: Payer: Medicare Other | Admitting: Orthotics

## 2017-01-15 DIAGNOSIS — M722 Plantar fascial fibromatosis: Secondary | ICD-10-CM

## 2017-01-15 DIAGNOSIS — M779 Enthesopathy, unspecified: Secondary | ICD-10-CM

## 2017-01-15 DIAGNOSIS — M79673 Pain in unspecified foot: Secondary | ICD-10-CM

## 2017-01-15 DIAGNOSIS — M778 Other enthesopathies, not elsewhere classified: Secondary | ICD-10-CM

## 2017-01-15 NOTE — Progress Notes (Signed)
Patient came in today to pick up custom made foot orthotics.  The goals were accomplished and the patient reported no dissatisfaction with said orthotics.  Patient was advised of breakin period and how to report any issues. 

## 2017-01-22 ENCOUNTER — Ambulatory Visit: Payer: Medicare Other | Admitting: Orthotics

## 2017-01-22 DIAGNOSIS — M778 Other enthesopathies, not elsewhere classified: Secondary | ICD-10-CM

## 2017-01-22 DIAGNOSIS — M722 Plantar fascial fibromatosis: Secondary | ICD-10-CM

## 2017-01-22 DIAGNOSIS — M779 Enthesopathy, unspecified: Secondary | ICD-10-CM

## 2017-01-22 NOTE — Progress Notes (Signed)
Patient returned L f/o complaining of heel pain.  F/O to be redone w/ shallower heel cup, horseshoe pad, and hug arch

## 2017-01-27 ENCOUNTER — Encounter: Payer: Self-pay | Admitting: Podiatry

## 2017-01-29 ENCOUNTER — Telehealth: Payer: Self-pay

## 2017-01-29 DIAGNOSIS — M79673 Pain in unspecified foot: Secondary | ICD-10-CM

## 2017-01-29 NOTE — Telephone Encounter (Signed)
Patient requesting copy of foot x rays from 12/14/16.  Patient notified that xray disc is ready to be picked up at front desk

## 2017-02-04 DIAGNOSIS — G8929 Other chronic pain: Secondary | ICD-10-CM | POA: Diagnosis not present

## 2017-02-04 DIAGNOSIS — M722 Plantar fascial fibromatosis: Secondary | ICD-10-CM | POA: Diagnosis not present

## 2017-02-04 DIAGNOSIS — M79672 Pain in left foot: Secondary | ICD-10-CM | POA: Diagnosis not present

## 2017-02-05 ENCOUNTER — Ambulatory Visit: Payer: Medicare Other | Admitting: Orthotics

## 2017-02-05 DIAGNOSIS — M79672 Pain in left foot: Principal | ICD-10-CM

## 2017-02-05 DIAGNOSIS — M778 Other enthesopathies, not elsewhere classified: Secondary | ICD-10-CM

## 2017-02-05 DIAGNOSIS — M79671 Pain in right foot: Secondary | ICD-10-CM

## 2017-02-05 DIAGNOSIS — M779 Enthesopathy, unspecified: Secondary | ICD-10-CM

## 2017-02-05 NOTE — Progress Notes (Signed)
F/O not arrived from [place]..rescheduled

## 2017-03-15 ENCOUNTER — Encounter: Payer: Self-pay | Admitting: Podiatry

## 2017-03-15 NOTE — Telephone Encounter (Signed)
Alexander Yang,  Yes, patient would be a candidate for EPAT. Please let him know the cost and that it is not covered by insurance. If he wants to proceed, set him up with an appt with Janett Billow.  Dr. Amalia Hailey

## 2017-03-22 ENCOUNTER — Ambulatory Visit: Payer: Medicare Other

## 2017-03-25 ENCOUNTER — Ambulatory Visit: Payer: Medicare Other

## 2017-03-25 ENCOUNTER — Telehealth: Payer: Self-pay | Admitting: Family Medicine

## 2017-03-25 DIAGNOSIS — R809 Proteinuria, unspecified: Secondary | ICD-10-CM

## 2017-03-25 DIAGNOSIS — E1140 Type 2 diabetes mellitus with diabetic neuropathy, unspecified: Secondary | ICD-10-CM

## 2017-03-25 NOTE — Telephone Encounter (Signed)
-----   Message from Eustace Pen, LPN sent at 3/83/2919  4:32 PM EST ----- Regarding: Labs 1/15 Lab orders needed. Thank you.  Insurance:  Medicare - traditional

## 2017-03-26 ENCOUNTER — Ambulatory Visit: Payer: Medicare Other | Admitting: Family Medicine

## 2017-03-26 ENCOUNTER — Ambulatory Visit (INDEPENDENT_AMBULATORY_CARE_PROVIDER_SITE_OTHER): Payer: Medicare Other

## 2017-03-26 VITALS — Temp 97.6°F | Ht 71.0 in | Wt 192.5 lb

## 2017-03-26 DIAGNOSIS — E1140 Type 2 diabetes mellitus with diabetic neuropathy, unspecified: Secondary | ICD-10-CM | POA: Diagnosis not present

## 2017-03-26 DIAGNOSIS — Z0000 Encounter for general adult medical examination without abnormal findings: Secondary | ICD-10-CM | POA: Diagnosis not present

## 2017-03-26 LAB — COMPREHENSIVE METABOLIC PANEL
ALT: 17 U/L (ref 0–53)
AST: 14 U/L (ref 0–37)
Albumin: 4.2 g/dL (ref 3.5–5.2)
Alkaline Phosphatase: 43 U/L (ref 39–117)
BUN: 16 mg/dL (ref 6–23)
CHLORIDE: 101 meq/L (ref 96–112)
CO2: 30 meq/L (ref 19–32)
Calcium: 9.7 mg/dL (ref 8.4–10.5)
Creatinine, Ser: 0.8 mg/dL (ref 0.40–1.50)
GFR: 99.86 mL/min (ref >60.00)
GLUCOSE: 192 mg/dL — AB (ref 70–99)
POTASSIUM: 4.9 meq/L (ref 3.5–5.1)
Sodium: 138 mEq/L (ref 135–145)
Total Bilirubin: 0.8 mg/dL (ref 0.2–1.2)
Total Protein: 7.4 g/dL (ref 6.0–8.3)

## 2017-03-26 LAB — LIPID PANEL
CHOL/HDL RATIO: 4
Cholesterol: 169 mg/dL (ref 0–200)
HDL: 47.6 mg/dL (ref >39.00)
LDL CALC: 99 mg/dL (ref 0–99)
NonHDL: 121.23
TRIGLYCERIDES: 110 mg/dL (ref 0.0–149.0)
VLDL: 22 mg/dL (ref 0.0–40.0)

## 2017-03-26 LAB — HEMOGLOBIN A1C: Hgb A1c MFr Bld: 8.2 % — ABNORMAL HIGH (ref 4.6–6.5)

## 2017-03-26 NOTE — Progress Notes (Signed)
Pre visit review using our clinic review tool, if applicable. No additional management support is needed unless otherwise documented below in the visit note. 

## 2017-03-26 NOTE — Progress Notes (Signed)
PCP notes:   Health maintenance:  Colon cancer screening - PCP please discuss at next appt Tetanus vaccine - postponed/insurance A1C - competed  Abnormal screenings:   Hearing - failed  Hearing Screening   125Hz  250Hz  500Hz  1000Hz  2000Hz  3000Hz  4000Hz  6000Hz  8000Hz   Right ear:   40 40 40  0    Left ear:   40 40 40  0     Patient concerns:   None  Nurse concerns:  None  Next PCP appt:   03/29/17 @ 1515

## 2017-03-26 NOTE — Patient Instructions (Signed)
Alexander Yang , Thank you for taking time to come for your Medicare Wellness Visit. I appreciate your ongoing commitment to your health goals. Please review the following plan we discussed and let me know if I can assist you in the future.   These are the goals we discussed: Goals    . Increase physical activity     Starting 03/26/2017, I will continue to exercise for at least 30-45 minutes 3-4 days per week.        This is a list of the screening recommended for you and due dates:  Health Maintenance  Topic Date Due  . Colon Cancer Screening  03/11/2018*  . Tetanus Vaccine  03/26/2018*  . Eye exam for diabetics  08/22/2017  . Complete foot exam   09/20/2017  . Hemoglobin A1C  09/23/2017  . Flu Shot  Completed  . Pneumonia vaccines  Completed  *Topic was postponed. The date shown is not the original due date.   Preventive Care for Adults  A healthy lifestyle and preventive care can promote health and wellness. Preventive health guidelines for adults include the following key practices.  . A routine yearly physical is a good way to check with your health care provider about your health and preventive screening. It is a chance to share any concerns and updates on your health and to receive a thorough exam.  . Visit your dentist for a routine exam and preventive care every 6 months. Brush your teeth twice a day and floss once a day. Good oral hygiene prevents tooth decay and gum disease.  . The frequency of eye exams is based on your age, health, family medical history, use  of contact lenses, and other factors. Follow your health care provider's recommendations for frequency of eye exams.  . Eat a healthy diet. Foods like vegetables, fruits, whole grains, low-fat dairy products, and lean protein foods contain the nutrients you need without too many calories. Decrease your intake of foods high in solid fats, added sugars, and salt. Eat the right amount of calories for you. Get  information about a proper diet from your health care provider, if necessary.  . Regular physical exercise is one of the most important things you can do for your health. Most adults should get at least 150 minutes of moderate-intensity exercise (any activity that increases your heart rate and causes you to sweat) each week. In addition, most adults need muscle-strengthening exercises on 2 or more days a week.  Silver Sneakers may be a benefit available to you. To determine eligibility, you may visit the website: www.silversneakers.com or contact program at 573-010-7242 Mon-Fri between 8AM-8PM.   . Maintain a healthy weight. The body mass index (BMI) is a screening tool to identify possible weight problems. It provides an estimate of body fat based on height and weight. Your health care provider can find your BMI and can help you achieve or maintain a healthy weight.   For adults 20 years and older: ? A BMI below 18.5 is considered underweight. ? A BMI of 18.5 to 24.9 is normal. ? A BMI of 25 to 29.9 is considered overweight. ? A BMI of 30 and above is considered obese.   . Maintain normal blood lipids and cholesterol levels by exercising and minimizing your intake of saturated fat. Eat a balanced diet with plenty of fruit and vegetables. Blood tests for lipids and cholesterol should begin at age 70 and be repeated every 5 years. If your lipid or cholesterol  levels are high, you are over 50, or you are at high risk for heart disease, you may need your cholesterol levels checked more frequently. Ongoing high lipid and cholesterol levels should be treated with medicines if diet and exercise are not working.  . If you smoke, find out from your health care provider how to quit. If you do not use tobacco, please do not start.  . If you choose to drink alcohol, please do not consume more than 2 drinks per day. One drink is considered to be 12 ounces (355 mL) of beer, 5 ounces (148 mL) of wine, or 1.5  ounces (44 mL) of liquor.  . If you are 79-2 years old, ask your health care provider if you should take aspirin to prevent strokes.  . Use sunscreen. Apply sunscreen liberally and repeatedly throughout the day. You should seek shade when your shadow is shorter than you. Protect yourself by wearing long sleeves, pants, a wide-brimmed hat, and sunglasses year round, whenever you are outdoors.  . Once a month, do a whole body skin exam, using a mirror to look at the skin on your back. Tell your health care provider of new moles, moles that have irregular borders, moles that are larger than a pencil eraser, or moles that have changed in shape or color.

## 2017-03-26 NOTE — Progress Notes (Signed)
Subjective:   Alexander Yang is a 77 y.o. male who presents for Medicare Annual/Subsequent preventive examination.  Review of Systems:  N/A Cardiac Risk Factors include: advanced age (>53men, >36 women);male gender;diabetes mellitus;dyslipidemia;hypertension     Objective:    Vitals: Temp 97.6 F (36.4 C) (Oral)   Ht 5\' 11"  (1.803 m) Comment: no shoes  Wt 192 lb 8 oz (87.3 kg)   SpO2 96%   BMI 26.85 kg/m   Body mass index is 26.85 kg/m.  Advanced Directives 03/26/2017 01/27/2016 10/26/2015 09/06/2015 04/26/2015 12/24/2014  Does Patient Have a Medical Advance Directive? Yes Yes No Yes No Yes  Type of Paramedic of [place];Living will [place];Living will - [place];Living will - [place];Living will  Does patient want to make changes to medical advance directive? - No - Patient declined - No - Patient declined - No - Patient declined  Copy of [place] in Chart? No - copy requested No - copy requested - No - copy requested - No - copy requested  Would patient like information on creating a medical advance directive? - - Yes - Educational materials given - Yes - Educational materials given -    Tobacco Social History   Tobacco Use  Smoking Status Former Smoker  . Packs/day: 2.00  . Years: 10.00  . Pack years: 20.00  . Types: Cigarettes  . Last attempt to quit: 03/12/1965  . Years since quitting: 52.0  Smokeless Tobacco Never Used     Counseling given: No   Clinical Intake:  Pre-visit preparation completed: Yes  Pain : No/denies pain Pain Score: 0-No pain     Nutritional Status: BMI 25 -29 Overweight Nutritional Risks: None Diabetes: Yes CBG done?: No Did pt. bring in CBG monitor from home?: No  How often do you need to have someone help you when you read instructions, pamphlets, or other written materials from your doctor or pharmacy?: 1 - Never What is the  last grade level you completed in school?: 12th grade + some college  Interpreter Needed?: No  Comments: pt lives with spouse Information entered by :: LPinson, LPN  Past Medical History:  Diagnosis Date  . Cerebrovascular accident ([place])   . Diabetes mellitus without complication ([place])    controlled with diet  . Hypertension   . 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 Osteoporosis   . Prostate cancer [facility])    Past Surgical History:  Procedure Laterality Date  . ACL Tear  03/12/1978  . PROSTATE BIOPSY    . TONSILLECTOMY  03/13/1947  . VASECTOMY     Family History  Problem Relation Age of Onset  . Cerebral aneurysm Mother   . Diabetes Father   . Bradycardia Father   . Benign prostatic hyperplasia Father   . Supraventricular tachycardia Brother   . Supraventricular tachycardia Brother    Social History   Socioeconomic History  . Marital status: Married    Spouse name: None  . Number of children: 2  . Years of education: None  . Highest education level: None  Social Needs  . Financial resource strain: None  . Food insecurity - worry: None  . Food insecurity - inability: None  . Transportation needs - medical: None  . Transportation needs - non-medical: None  Occupational History  .  Occupation: Retired  Tobacco Use  . Smoking status: Former Smoker    Packs/day: 2.00    Years: 10.00    Pack years: 20.00    Types: Cigarettes    Last attempt to quit: 03/12/1965    Years since quitting: 52.0  . Smokeless tobacco: Never Used  Substance and Sexual Activity  . Alcohol use: Yes    Alcohol/week: 0.0 oz    Comment: Glass of wine daily  . Drug use: No  . Sexual activity: No  Other Topics Concern  . None  Social History Narrative   Exercising: 3-4 times a week.   Healthy eating habits.   Full Code   Has a living  will, has HCPOA: Physiological scientist (reviewed 2015)    Outpatient Encounter Medications as of 03/26/2017  Medication Sig  . aspirin 81 MG tablet Take 1 tablet (81 mg total) by mouth daily.  . Broccoli Extract 500 MG TABS Take 300 mg by mouth.  . Cholecalciferol (VITAMIN D3) 2000 units capsule Take by mouth.  Nyoka Cowden Tea, Camillia sinensis, ([employer] SUPER GREEN TEA EXTRACT PO) Take 60 mg by mouth.  Marland Kitchen ketoconazole (NIZORAL) 2 % cream Apply 1 application topically daily as needed for irritation.  Marland Kitchen losartan (COZAAR) 25 MG tablet TAKE 1 TABLET BY MOUTH ONCE DAILY  . metFORMIN (GLUCOPHAGE-XR) 500 MG 24 hr tablet Take 1 tablet (500 mg total) by mouth daily with breakfast.  . pravastatin (PRAVACHOL) 40 MG tablet TAKE ONE TABLET BY MOUTH ONCE DAILY  . TURMERIC PO   . UNABLE TO FIND Take 300 mg by mouth. pomegranite  . leuprolide (LUPRON) 3.75 MG injection Inject 3.75 mg into the muscle every 28 (twenty-eight) days.   Facility-Administered Encounter Medications as of 03/26/2017  Medication  . betamethasone acetate-betamethasone sodium phosphate (CELESTONE) injection 3 mg  . betamethasone acetate-betamethasone sodium phosphate (CELESTONE) injection 3 mg    Activities of Daily Living In your present state of health, do you have any difficulty performing the following activities: 03/26/2017  Hearing? N  Vision? N  Difficulty concentrating or making decisions? N  Walking or climbing stairs? N  Dressing or bathing? N  Doing errands, shopping? N  Preparing Food and eating ? N  Using the Toilet? N  In the past six months, have you accidently leaked urine? N  Do you have problems with loss of bowel control? Y  Managing your Medications? N  Managing your Finances? N  Housekeeping or managing your Housekeeping? N  Some recent data might be hidden    Patient Care Team: Jinny Sanders, MD as PCP - General Alen Blew Mathis Dad, MD as Consulting Physician (Oncology) Katherine Mantle, [place] as Referring Physician  (Optometry)   Assessment:   This is a routine wellness examination for [name].   Hearing Screening   125Hz  250Hz  500Hz  1000Hz  2000Hz  3000Hz  4000Hz  6000Hz  8000Hz   Right ear:   40 40 40  0    Left ear:   40 40 40  0    Vision Screening Comments: Last vision exam in June 2018 @ [facility]    Exercise Activities and Dietary recommendations Current Exercise Habits: Home exercise routine, Type of exercise: strength training/weights;Other - see comments(cardio), Time (Minutes): 45, Frequency (Times/Week): 4, Weekly Exercise (Minutes/Week): 180, Intensity: Moderate, Exercise limited by: None identified  Goals    . Increase physical activity     Starting 03/26/2017, I will continue to exercise for at least 30-45 minutes 3-4 days per week.  Fall Risk Fall Risk  03/26/2017 09/20/2016 09/06/2015 12/24/2014 08/31/2014  Falls in the past year? No Yes No No No  Number falls in past yr: - 1 - - -  Injury with Fall? - Yes - - -   Depression Screen PHQ 2/9 Scores 03/26/2017 09/20/2016 09/06/2015 12/24/2014  PHQ - 2 Score 0 0 0 0  PHQ- 9 Score 0 - - -    Cognitive Function MMSE - Mini Mental State Exam 03/26/2017 09/06/2015  Orientation to time 5 5  Orientation to Place 5 5  Registration 3 3  Attention/ Calculation 0 0  Recall 3 3  Language- name 2 objects 0 0  Language- repeat 1 1  Language- follow 3 step command 3 3  Language- read & follow direction 0 0  Write a sentence 0 0  Copy design 0 0  Total score 20 20       PLEASE NOTE: A Mini-Cog screen was completed. Maximum score is 20. A value of 0 denotes this part of [place] MMSE was not completed or the patient failed this part of the Mini-Cog screening.   Mini-Cog Screening Orientation to Time - Max 5 pts Orientation to Place - Max 5 pts Registration - Max 3 pts Recall - Max 3 pts Language Repeat - Max 1 pts Language Follow 3 Step Command - Max 3 pts   Immunization History  Administered Date(s) Administered  . Influenza  Whole 01/10/2007, 12/09/2007, 12/14/2008  . Influenza, High Dose Seasonal PF 12/11/2013, 12/17/2014, 12/02/2015, 12/02/2015  . Influenza-Unspecified 12/10/2012, 12/10/2016  . Pneumococcal Conjugate-13 08/11/2013  . Pneumococcal Polysaccharide-23 03/26/2007  . Td 03/26/2007  . Zoster 12/03/2007    Screening Tests Health Maintenance  Topic Date Due  . COLONOSCOPY  03/11/2018 (Originally 01/16/2014)  . TETANUS/TDAP  03/26/2018 (Originally 03/25/2017)  . OPHTHALMOLOGY EXAM  08/22/2017  . FOOT EXAM  09/20/2017  . HEMOGLOBIN A1C  09/23/2017  . INFLUENZA VACCINE  Completed  . PNA vac Low Risk Adult  Completed      Plan:     I have personally reviewed, addressed, and noted the following in the patient's chart:  A. Medical and social history B. Use of alcohol, tobacco or illicit drugs  C. Current medications and supplements D. Functional ability and status E.  Nutritional status F.  Physical activity G. Advance directives H. List of other physicians I.  Hospitalizations, surgeries, and ER visits in previous 12 months J.  [place] to include hearing, vision, cognitive, depression L. Referrals and appointments - none  In addition, I have reviewed and discussed with patient certain preventive protocols, quality metrics, and best practice recommendations. A written personalized care plan for preventive services as well as general preventive health recommendations were provided to patient.  See attached scanned questionnaire for additional information.   Signed,   Lindell Noe, MHA, BS, LPN Health Coach

## 2017-03-26 NOTE — Progress Notes (Signed)
I reviewed health advisor's note, was available for consultation, and agree with documentation and plan.  

## 2017-03-29 ENCOUNTER — Ambulatory Visit (INDEPENDENT_AMBULATORY_CARE_PROVIDER_SITE_OTHER): Payer: Medicare Other | Admitting: Family Medicine

## 2017-03-29 ENCOUNTER — Encounter: Payer: Self-pay | Admitting: Family Medicine

## 2017-03-29 ENCOUNTER — Other Ambulatory Visit: Payer: Self-pay

## 2017-03-29 VITALS — BP 122/74 | HR 78 | Temp 98.5°F | Ht 71.0 in | Wt 195.5 lb

## 2017-03-29 DIAGNOSIS — I10 Essential (primary) hypertension: Secondary | ICD-10-CM

## 2017-03-29 DIAGNOSIS — Z0000 Encounter for general adult medical examination without abnormal findings: Secondary | ICD-10-CM

## 2017-03-29 DIAGNOSIS — E1140 Type 2 diabetes mellitus with diabetic neuropathy, unspecified: Secondary | ICD-10-CM

## 2017-03-29 DIAGNOSIS — C61 Malignant neoplasm of prostate: Secondary | ICD-10-CM | POA: Diagnosis not present

## 2017-03-29 DIAGNOSIS — E1142 Type 2 diabetes mellitus with diabetic polyneuropathy: Secondary | ICD-10-CM

## 2017-03-29 DIAGNOSIS — Z0001 Encounter for general adult medical examination with abnormal findings: Secondary | ICD-10-CM | POA: Diagnosis not present

## 2017-03-29 DIAGNOSIS — E7800 Pure hypercholesterolemia, unspecified: Secondary | ICD-10-CM | POA: Diagnosis not present

## 2017-03-29 DIAGNOSIS — I700 Atherosclerosis of aorta: Secondary | ICD-10-CM

## 2017-03-29 MED ORDER — METFORMIN HCL ER (MOD) 1000 MG PO TB24: 1000.0000 mg | ORAL_TABLET | Freq: Every day | ORAL | 3 refills | Status: DC

## 2017-03-29 NOTE — Assessment & Plan Note (Signed)
Poor control. Increase dose of metformin. Follow up in 3 months.

## 2017-03-29 NOTE — Progress Notes (Signed)
Subjective:    Patient ID: Alexander Yang, male    DOB: 1940-12-04, 77 y.o.   MRN: [id number]  HPI The patient presents for annual medicare wellness, complete physical and review of chronic health problems. He/She also has the following acute concerns today: High blood sugar!   The patient saw Candis Musa, LPN for medicare wellness. Note reviewed in detail and important notes copied below. Colon cancer screening - PCP please discuss at next appt Tetanus vaccine - postponed/insurance A1C - competed Abnormal screenings:  Hearing - failed             Hearing Screening   125Hz  250Hz  500Hz  1000Hz  2000Hz  3000Hz  4000Hz  6000Hz  8000Hz   Right ear:   40 40 40  0    Left ear:   40 40 40  0     Patient concerns:  None  Today:  Diabetes: Worse sugar control in last 6 months.   Moderate diet control but no  Recent steroids etc.  he is on low dose metformin. Lab Results  Component Value Date   HGBA1C 8.2 (H) 03/26/2017  Using medications without difficulties: Hypoglycemic episodes:? Hyperglycemic episodes:? Feet problems: no ulcers. Blood Sugars averaging: not checking. eye exam within last year: He is on lupron for prostate cancer. No weight gain.   Wt Readings from Last 3 Encounters:  03/29/17 195 lb 8 oz (88.7 kg)  03/26/17 192 lb 8 oz (87.3 kg)  01/08/17 195 lb 14.4 oz (88.9 kg)    Elevated Cholesterol: Good control on pravastatin daily. Using medications without problems: Muscle aches:  Diet compliance: moderate Exercise: frequent Other complaints: Lab Results  Component Value Date   CHOL 169 03/26/2017   HDL 47.60 03/26/2017   LDLCALC 99 03/26/2017   TRIG 110.0 03/26/2017   CHOLHDL 4 03/26/2017    Blood pressure 122/74, pulse 78, temperature 98.5 F (36.9 C), temperature source Oral, height 5\' 11"  (1.803 m), weight 195 lb 8 oz (88.7 kg). Social History /Family History/Past Medical History reviewed in detail and updated in EMR if  needed.   Review of Systems  Constitutional: Negative for fatigue and fever.  HENT: Negative for ear pain.   Eyes: Negative for pain.  Respiratory: Negative for cough and shortness of breath.   Cardiovascular: Negative for chest pain, palpitations and leg swelling.  Gastrointestinal: Negative for abdominal pain.  Genitourinary: Negative for dysuria.  Musculoskeletal: Negative for arthralgias.  Neurological: Negative for syncope, light-headedness and headaches.  Psychiatric/Behavioral: Negative for dysphoric mood.       Objective:   Physical Exam  Constitutional: Vital signs are normal. He appears well-developed and well-nourished.  HENT:  Head: Normocephalic.  Right Ear: Hearing normal.  Left Ear: Hearing normal.  Nose: Nose normal.  Mouth/Throat: Oropharynx is clear and moist and mucous membranes are normal.  Neck: Trachea normal. Carotid bruit is not present. No thyroid mass and no thyromegaly present.  Cardiovascular: Normal rate, regular rhythm and normal pulses. Exam reveals no gallop, no distant heart sounds and no friction rub.  No murmur heard. No peripheral edema  Pulmonary/Chest: Effort normal and breath sounds normal. No respiratory distress.  Skin: Skin is warm, dry and intact. No rash noted.  Psychiatric: He has a normal mood and affect. His speech is normal and behavior is normal. Thought content normal.    Diabetic foot exam: Normal inspection No skin breakdown No calluses  Normal DP pulses decreased sensation to light touch and monofilament Nails normal     Assessment & Plan:  The  patient's preventative maintenance and recommended screening tests for an annual wellness exam were reviewed in full today. Brought up to date unless services declined.  Counselled on the importance of diet, exercise, and its role in overall health and mortality. The patient's FH and SH was reviewed, including their home life, tobacco status, and drug and alcohol status.     Vaccines: uptodate. Colon: 2012 Dr. Flonnie Overman, low risk polyp. ? Recall 3-5 years? Will plan cologuard for eval now to determine recall.   Prostate cancer: on lupron

## 2017-03-29 NOTE — Assessment & Plan Note (Signed)
Stable control of pain.

## 2017-03-29 NOTE — Assessment & Plan Note (Signed)
Risk factor control. 

## 2017-03-29 NOTE — Assessment & Plan Note (Signed)
Well controlled. Continue current medication.  

## 2017-03-29 NOTE — Patient Instructions (Addendum)
Increase metformin to 1000 mg daily.  Start following blood sugars at home.  Goal Fasting blood blood sugar < 120. Too low < 60... Drink some OJ and repeat.   2 hours after meals < 180.

## 2017-03-30 ENCOUNTER — Encounter: Payer: Self-pay | Admitting: Family Medicine

## 2017-04-01 MED ORDER — METFORMIN HCL 1000 MG PO TABS: 1000.0000 mg | ORAL_TABLET | Freq: Every day | ORAL | 3 refills | Status: DC

## 2017-04-01 NOTE — Telephone Encounter (Signed)
Metformin Glumetza was originally sent in. New Rx for Glucophage 1000 mg sent to [facility].  See previous MyCHart message.

## 2017-04-02 ENCOUNTER — Encounter: Payer: Self-pay | Admitting: Family Medicine

## 2017-04-03 ENCOUNTER — Other Ambulatory Visit: Payer: Self-pay | Admitting: Family Medicine

## 2017-04-03 DIAGNOSIS — M722 Plantar fascial fibromatosis: Secondary | ICD-10-CM | POA: Diagnosis not present

## 2017-04-03 DIAGNOSIS — C61 Malignant neoplasm of prostate: Secondary | ICD-10-CM | POA: Diagnosis not present

## 2017-04-03 MED ORDER — ACCU-CHEK SOFTCLIX LANCET DEV KIT: PACK | 0 refills | Status: DC

## 2017-04-03 MED ORDER — METFORMIN HCL ER 500 MG PO TB24: 1000.0000 mg | ORAL_TABLET | Freq: Every day | ORAL | 3 refills | Status: DC

## 2017-04-03 MED ORDER — GLUCOSE BLOOD VI STRP: ORAL_STRIP | 11 refills | Status: DC

## 2017-04-03 MED ORDER — ACCU-CHEK SOFT TOUCH LANCETS MISC: 11 refills | Status: DC

## 2017-04-03 MED ORDER — ACCU-CHEK AVIVA PLUS W/DEVICE KIT: PACK | 0 refills | Status: DC

## 2017-04-03 NOTE — Progress Notes (Signed)
Noted.  No other issues seen.

## 2017-04-03 NOTE — Progress Notes (Signed)
Alexander Yang.. I think when the medicine was changed from [facility] to Glucophage the short acting was sent in. There is no 1000 mg glucophage, I believe.  I sent in a new rx..and sent him back and email.  Do you see any other issue?  Bryndle Corredor

## 2017-04-03 NOTE — Telephone Encounter (Signed)
Rx for Glucose Meter, Lancet Devi, Lancets and test strips for Accu-Chek Aviva printed and placed into Dr. Rometta Emery in box for signature.

## 2017-04-03 NOTE — Addendum Note (Signed)
Addended by: Carter Kitten on: 04/03/2017 10:23 AM   Modules accepted: Orders

## 2017-04-08 DIAGNOSIS — Z1211 Encounter for screening for malignant neoplasm of colon: Secondary | ICD-10-CM | POA: Diagnosis not present

## 2017-04-08 DIAGNOSIS — Z1212 Encounter for screening for malignant neoplasm of rectum: Secondary | ICD-10-CM | POA: Diagnosis not present

## 2017-04-08 LAB — COLOGUARD: COLOGUARD: NEGATIVE

## 2017-04-10 ENCOUNTER — Inpatient Hospital Stay: Payer: Medicare Other | Attending: Oncology | Admitting: Oncology

## 2017-04-10 ENCOUNTER — Telehealth: Payer: Self-pay | Admitting: Oncology

## 2017-04-10 VITALS — BP 168/78 | HR 66 | Temp 97.7°F | Resp 18 | Ht 71.0 in | Wt 195.5 lb

## 2017-04-10 DIAGNOSIS — C61 Malignant neoplasm of prostate: Secondary | ICD-10-CM | POA: Diagnosis not present

## 2017-04-10 NOTE — Progress Notes (Signed)
Hematology and Oncology Follow Up Visit  Alexander Yang [id number] Apr 30, 1940 77 y.o. 04/10/2017 10:58 AM [name], [name], [name], [name], MD   Principle Diagnosis: 77 year old man with the prostate cancer diagnosed in 2009. He had Gleason score 3+3 = 6 PSA of 3.17.  He has biochemical relapse that is hormone sensitive.  Prior Therapy: Observation and surveillance between 2009 in 2011. He was found to have a Gleason score 4+4 = 8 in 2011 and PSA of . He subsequently was evaluated the [facility] for proton therapy. He received radiation therapy between October 2011 until December 2011. He received concomitant Taxotere chemotherapy at a low dose but it was unclear to me whether he had it as a part of a clinical trial. He also received 6 months of hormone therapy up until July 2012. His PSA nadir was down to 0.1 In December 2013. His PSA continue to be low close to 0.1 and 0.2 Up till December 2015.  In June 2016 was up to 0.4 and his most recent PSA in September 2016 was up to 0.7.    Current therapy:   Androgen deprivation therapy with Lupron at 7.5 mg on a monthly basis started in March 2018.  Last injection will be given on January 08, 2017 and has been receiving it intermittently moving forward.   Interim History:  Alexander Yang is here for a follow-up visit.  His last Lupron injection was in October 2018 and has not received any injections since that time.  He opted to proceed with intermittent therapy for better tolerance.  He feels much better since stopping Lupron injections with increased energy and decrease in his hot flashes.  He denies any urinary complaints including frequency urgency.  He denies any worsening diarrhea.  His performance status and activity level remain excellent.  He does not report any headaches, blurry vision, seizures. Does not report any fevers, chills or sweats. Does not report any cough or hemoptysis. Does not report any chest pain, palpitation,  orthopnea or leg edema. He denied any nausea, vomiting or abdominal pain. Denies any frequency urgency or hesitancy.  He does not report any skeletal complaints of arthralgias or myalgias.  He does not report any heat or cold intolerance.  He does not report any lymphadenopathy or petechiae.  Remaining review of systems unremarkable.  Medications: I have reviewed the patient's current medications.  Current Outpatient Medications  Medication Sig Dispense Refill  . aspirin 81 MG tablet Take 1 tablet (81 mg total) by mouth daily. 30 tablet   . Blood Glucose Monitoring Suppl (ACCU-CHEK AVIVA PLUS) w/Device KIT Use to check blood sugar 2 times a day.  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 Broccoli Extract 500 MG TABS Take 300 mg by mouth.    . Cholecalciferol (VITAMIN D3) 2000 units capsule Take by mouth.    Marland Kitchen glucose blood (ACCU-CHEK AVIVA) test strip Use to check blood sugar 2 times a day.  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 Green Tea, Camillia sinensis, ([employer]S SUPER GREEN TEA EXTRACT PO) Take 60 mg by mouth.    Marland Kitchen ketoconazole (NIZORAL) 2 % cream Apply 1 application topically daily as needed for irritation.    . Lancets (ACCU-CHEK SOFT TOUCH) lancets Use to check blood sugar 2 times a day.  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 Lancets Misc. (ACCU-CHEK SOFTCLIX LANCET DEV) KIT Use to check blood sugar 2 times a day.  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 leuprolide (LUPRON) 3.75 MG  injection Inject 3.75 mg into the muscle every 28 (twenty-eight) days.    Marland Kitchen losartan (COZAAR) 25 MG tablet TAKE 1 TABLET BY MOUTH ONCE DAILY 90 tablet 1  . metFORMIN (GLUCOPHAGE-XR) 500 MG 24 hr tablet Take 2 tablets (1,000 mg total) by mouth daily with breakfast. 180 tablet 3  . pravastatin (PRAVACHOL) 40 MG tablet TAKE ONE TABLET BY MOUTH ONCE DAILY 90 tablet 3  . TURMERIC PO     . UNABLE TO FIND Take 300 mg by mouth. pomegranite     Current Facility-Administered Medications  Medication Dose Route Frequency Provider Last Rate Last Dose  . betamethasone  acetate-betamethasone sodium phosphate (CELESTONE) injection 3 mg  3 mg Intramuscular Once Daylene Katayama M, DPM      . betamethasone acetate-betamethasone sodium phosphate (CELESTONE) injection 3 mg  3 mg Intramuscular Once Edrick Kins, DPM         Allergies: No Known Allergies  Past Medical History, Surgical history, Social history, and Family History discussed and remain unchanged.  Blood pressure (!) 168/78, pulse 66, temperature 97.7 F (36.5 C), temperature source Oral, resp. rate 18, height 5' 11"  (1.803 m), weight 195 lb 8 oz (88.7 kg), SpO2 100 %. ECOG 0.  General appearance: Well-appearing gentleman without distress. Head: Atraumatic without abnormalities. Oropharynx: Without thrush or ulcers. Eyes: No scleral icterus. Resp: clear to auscultation bilaterally no wheezes or rales to percussion.  Cardio: regular rate and rhythm, S1, S2.  Abdomen: No rebound or guarding.  Good bowel sounds in all 4 quadrants. Musculoskeletal: No joint deformity or effusion. Skin: Skin color, texture, turgor normal. No rashes or lesions Lymph nodes: Cervical, supraclavicular, and axillary nodes normal.   PSA on 04/20/2015 was 2.6.  PSA on 07/19/2015 was 3.7. PSA on 10/18/2015 was 3.2. PSA on 01/11/2016 was 4.4. PSA on 04/12/2016 was 8.2. Lupron started in March 2018. PSA on 08/23/2016 was 0.7 PSA in August 2018 was 0.7. PSA in 12/2016 0.7.  Testosterone was less than 3. PSA on April 03, 2017 was 1.5.   Impression and Plan:   77 year old gentleman with the following issues:  1.  Biochemically relapsing prostate cancer.  He was initially diagnosed in 2009 with a Gleason score 3+3 = 6 and a PSA of 3.17.   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  He is currently on Lupron at 7.5 mg monthly and has been receiving intermittently.  He is last Lupron was in October 2018.  The natural course of this disease was reviewed again with the patient and his risk of  developing castration resistant disease was discussed.  Risks and benefits of intermittent versus continuous Lupron was discussed and he is agreeable to continue with intermittent therapy.  He will have PSA checked every 3 months and potentially resume Lupron if his PSA becomes above 10.  2. Follow-up: Will be 3 months for a clinical visit.  15  minutes was spent with the patient face-to-face today.  More than 50% of time was dedicated to patient counseling, education and coordination of his multifaceted care.  Zola Button, MD 1/30/201910:58 AM

## 2017-04-10 NOTE — Telephone Encounter (Signed)
Gave avs and calendar for may  °

## 2017-04-18 ENCOUNTER — Encounter: Payer: Self-pay | Admitting: Gastroenterology

## 2017-04-19 ENCOUNTER — Telehealth: Payer: Self-pay | Admitting: *Deleted

## 2017-04-19 ENCOUNTER — Encounter: Payer: Self-pay | Admitting: Family Medicine

## 2017-04-19 NOTE — Telephone Encounter (Signed)
[name] notified by telelphone that his Cologuard test was negative.  While one phone,  [name] states at his last office visit he was instructed to increase his Metformin XR 500 mg to 2 tablets daily.  He was also instructed to check his fasting blood sugar.  He states his fasting blood sugars have been running between 145-170 but mostly around the 170 range. Please advise.

## 2017-04-19 NOTE — Telephone Encounter (Signed)
Alexander Yang notified as instructed by telephone. He will try the three tablets daily to see if he can tolerate that dose and call me back when he needs a refill.

## 2017-04-19 NOTE — Telephone Encounter (Signed)
If not SE to metformin: Have him increase to 3 tabs of 500 mg metformin XR daily. Send in new rx if needed. Continue to follow fasting blood sugars .Marland Kitchen Goal < 120.

## 2017-05-06 ENCOUNTER — Encounter: Payer: Self-pay | Admitting: Family Medicine

## 2017-05-06 MED ORDER — METFORMIN HCL ER 500 MG PO TB24: 1500.0000 mg | ORAL_TABLET | Freq: Every day | ORAL | 3 refills | Status: DC

## 2017-05-06 NOTE — Addendum Note (Signed)
Addended by: Eliezer Lofts E on: 05/06/2017 05:04 PM   Modules accepted: Orders

## 2017-05-07 DIAGNOSIS — M722 Plantar fascial fibromatosis: Secondary | ICD-10-CM | POA: Diagnosis not present

## 2017-05-10 DIAGNOSIS — M722 Plantar fascial fibromatosis: Secondary | ICD-10-CM | POA: Diagnosis not present

## 2017-05-14 DIAGNOSIS — M722 Plantar fascial fibromatosis: Secondary | ICD-10-CM | POA: Diagnosis not present

## 2017-05-16 DIAGNOSIS — M722 Plantar fascial fibromatosis: Secondary | ICD-10-CM | POA: Diagnosis not present

## 2017-05-21 ENCOUNTER — Encounter: Payer: Self-pay | Admitting: Family Medicine

## 2017-05-21 DIAGNOSIS — M722 Plantar fascial fibromatosis: Secondary | ICD-10-CM | POA: Diagnosis not present

## 2017-05-21 MED ORDER — METFORMIN HCL ER 500 MG PO TB24: 2000.0000 mg | ORAL_TABLET | Freq: Every day | ORAL | 3 refills | Status: DC

## 2017-05-21 NOTE — Telephone Encounter (Signed)
Medication list updated.  I did go ahead and send in refills with new dose to [facility] on [address].

## 2017-05-21 NOTE — Addendum Note (Signed)
Addended by: Carter Kitten on: 05/21/2017 01:42 PM   Modules accepted: Orders

## 2017-05-21 NOTE — Telephone Encounter (Signed)
On MYchart I increased pt's metformin to 2000 mg daily.. Please update his med list and send in rx of needed.

## 2017-05-23 DIAGNOSIS — M722 Plantar fascial fibromatosis: Secondary | ICD-10-CM | POA: Diagnosis not present

## 2017-05-28 DIAGNOSIS — M722 Plantar fascial fibromatosis: Secondary | ICD-10-CM | POA: Diagnosis not present

## 2017-05-30 DIAGNOSIS — M722 Plantar fascial fibromatosis: Secondary | ICD-10-CM | POA: Diagnosis not present

## 2017-06-04 ENCOUNTER — Encounter: Payer: Self-pay | Admitting: Family Medicine

## 2017-06-04 DIAGNOSIS — E1140 Type 2 diabetes mellitus with diabetic neuropathy, unspecified: Secondary | ICD-10-CM

## 2017-06-04 DIAGNOSIS — M722 Plantar fascial fibromatosis: Secondary | ICD-10-CM | POA: Diagnosis not present

## 2017-06-06 DIAGNOSIS — M722 Plantar fascial fibromatosis: Secondary | ICD-10-CM | POA: Diagnosis not present

## 2017-06-10 ENCOUNTER — Telehealth: Payer: Self-pay

## 2017-06-10 NOTE — Telephone Encounter (Signed)
Left message for patient to call Lyndal Reggio back in regards to a referral-Seith Aikey V Emmakate Hypes, RMA   

## 2017-06-20 ENCOUNTER — Telehealth: Payer: Self-pay | Admitting: Family Medicine

## 2017-06-20 DIAGNOSIS — E1140 Type 2 diabetes mellitus with diabetic neuropathy, unspecified: Secondary | ICD-10-CM

## 2017-06-20 NOTE — Telephone Encounter (Signed)
-----   Message from Ellamae Sia sent at 06/17/2017 10:44 AM EDT ----- Regarding: Lab  orders for Monday, 4.15.19 Lab orders for a 3 month follow up appt.

## 2017-06-21 ENCOUNTER — Other Ambulatory Visit: Payer: Self-pay | Admitting: Family Medicine

## 2017-06-24 ENCOUNTER — Other Ambulatory Visit (INDEPENDENT_AMBULATORY_CARE_PROVIDER_SITE_OTHER): Payer: Medicare Other

## 2017-06-24 DIAGNOSIS — E1140 Type 2 diabetes mellitus with diabetic neuropathy, unspecified: Secondary | ICD-10-CM | POA: Diagnosis not present

## 2017-06-24 LAB — COMPREHENSIVE METABOLIC PANEL
ALK PHOS: 36 U/L — AB (ref 39–117)
ALT: 14 U/L (ref 0–53)
AST: 12 U/L (ref 0–37)
Albumin: 4 g/dL (ref 3.5–5.2)
BILIRUBIN TOTAL: 0.7 mg/dL (ref 0.2–1.2)
BUN: 14 mg/dL (ref 6–23)
CO2: 27 meq/L (ref 19–32)
Calcium: 9.2 mg/dL (ref 8.4–10.5)
Chloride: 104 mEq/L (ref 96–112)
Creatinine, Ser: 0.86 mg/dL (ref 0.40–1.50)
GFR: 91.8 mL/min (ref >60.00)
Glucose, Bld: 175 mg/dL — ABNORMAL HIGH (ref 70–99)
Potassium: 4.1 mEq/L (ref 3.5–5.1)
SODIUM: 139 meq/L (ref 135–145)
TOTAL PROTEIN: 6.8 g/dL (ref 6.0–8.3)

## 2017-06-24 LAB — HEMOGLOBIN A1C: Hgb A1c MFr Bld: 7 % — ABNORMAL HIGH (ref 4.6–6.5)

## 2017-06-26 ENCOUNTER — Encounter: Payer: Medicare Other | Attending: Family Medicine | Admitting: *Deleted

## 2017-06-26 ENCOUNTER — Encounter: Payer: Self-pay | Admitting: *Deleted

## 2017-06-26 VITALS — BP 140/76 | Ht 71.0 in | Wt 189.5 lb

## 2017-06-26 DIAGNOSIS — E119 Type 2 diabetes mellitus without complications: Secondary | ICD-10-CM

## 2017-06-26 DIAGNOSIS — E1140 Type 2 diabetes mellitus with diabetic neuropathy, unspecified: Secondary | ICD-10-CM | POA: Diagnosis not present

## 2017-06-26 DIAGNOSIS — Z713 Dietary counseling and surveillance: Secondary | ICD-10-CM | POA: Diagnosis not present

## 2017-06-26 NOTE — Progress Notes (Signed)
Diabetes Self-Management Education  Visit Type: First/Initial  Appt. Start Time: 1040 Appt. End Time: 1150  06/26/2017  [name], identified by name and date of birth, is a 77 y.o. male with a diagnosis of Diabetes: Type 2.   ASSESSMENT  Blood pressure 140/76, height 5\' 11"  (1.803 m), weight 189 lb 8 oz (86 kg). Body mass index is 26.43 kg/m.  Diabetes Self-Management Education - 06/26/17 1221      Visit Information   Visit Type  First/Initial      Initial Visit   Diabetes Type  Type 2    Are you currently following a meal plan?  Yes    What type of meal plan do you follow?  reduced meals from 3 per day to 2    Are you taking your medications as prescribed?  Yes    Date Diagnosed  1 year ago - A1C has been above 6.5 % since 2012      Health Coping   How would you rate your overall health?  Good      Psychosocial Assessment   Patient Belief/Attitude about Diabetes  Motivated to manage diabetes    Self-care barriers  None    Self-management support  Doctor's office;Family    Patient Concerns  Nutrition/Meal planning;Glycemic Control;Monitoring    Special Needs  None    Preferred Learning Style  Visual;Auditory    Learning Readiness  Change in progress    How often do you need to have someone help you when you read instructions, pamphlets, or other written materials from your doctor or pharmacy?  1 - Never    What is the last grade level you completed in school?  14      Pre-Education Assessment   Patient understands the diabetes disease and treatment process.  Needs Instruction    Patient understands incorporating nutritional management into lifestyle.  Needs Instruction    Patient undertands incorporating physical activity into lifestyle.  Demonstrates understanding / competency    Patient understands using medications safely.  Needs Instruction    Patient understands monitoring blood glucose, interpreting and using results  Needs Review    Patient understands  prevention, detection, and treatment of acute complications.  Needs Instruction    Patient understands prevention, detection, and treatment of chronic complications.  Needs Review    Patient understands how to develop strategies to address psychosocial issues.  Needs Instruction    Patient understands how to develop strategies to promote health/change behavior.  Needs Review      Complications   Last HgB A1C per patient/outside source  7 % 06/24/17    How often do you check your blood sugar?  1-2 times/day    Fasting Blood glucose range (mg/dL)  130-179 Pt reports FBG's 140-170's mg/dL.     Postprandial Blood glucose range (mg/dL)  >200 Pt reports he has checked 3 post meal readings and they were in the 200's mg/dL.     Have you had a dilated eye exam in the past 12 months?  Yes    Have you had a dental exam in the past 12 months?  Yes    Are you checking your feet?  No      Dietary Intake   Breakfast  cereal milk and fruit    Snack (morning)  doesn't eat snacks    Lunch  skips    Dinner  chicken, pork, fish when eating out; occasional sweet potato, peas, beans, corn, occasional spaghetti, salads, non starchy vegetables  Beverage(s)  water, coffee      Exercise   Exercise Type  Moderate (swimming / aerobic walking)    How many days per week to you exercise?  3    How many minutes per day do you exercise?  45    Total minutes per week of exercise  135      Patient Education   Previous Diabetes Education  No    Disease state   Definition of diabetes, type 1 and 2, and the diagnosis of diabetes;Explored patient's options for treatment of their diabetes    Nutrition management   Role of diet in the treatment of diabetes and the relationship between the three main macronutrients and blood glucose level;Food label reading, portion sizes and measuring food.;Reviewed blood glucose goals for pre and post meals and how to evaluate the patients' food intake on their blood glucose level.     Physical activity and exercise   Role of exercise on diabetes management, blood pressure control and cardiac health.    Medications  Reviewed patients medication for diabetes, action, purpose, timing of dose and side effects.    Monitoring  Purpose and frequency of SMBG.;Taught/discussed recording of test results and interpretation of SMBG.;Identified appropriate SMBG and/or A1C goals.    Chronic complications  Relationship between chronic complications and blood glucose control    Psychosocial adjustment  Identified and addressed patients feelings and concerns about diabetes      Individualized Goals (developed by patient)   Reducing Risk  Improve blood sugars  Prevent diabetes complications     Outcomes   Expected Outcomes  Demonstrated interest in learning. Expect positive outcomes    Program Status  Not Completed       Individualized Plan for Diabetes Self-Management Training:   Learning Objective:  Patient will have a greater understanding of diabetes self-management. Patient education plan is to attend individual and/or group sessions per assessed needs and concerns.   Plan:   Patient Instructions  Check blood sugars 1 x day before breakfast or 2 hrs after one meal every day Exercise: Continue routine for  45  minutes 3-4 days a week Eat 3 meals day Space meals 4-6 hours apart Don't skip meals Add 1 serving of protein for breakfast Include 1 serving of protein and 1 serving of carbohydrate for lunch Return for appointment/classes on:  Call back if you want to schedule classes or an additional appointment with the dietitian  Expected Outcomes:  Demonstrated interest in learning. Expect positive outcomes  Education material provided:  General Meal Planning Guidelines Simple Meal Plan Planning a Balanced Meal  If problems or questions, patient to contact team via: Johny Drilling, RN, CCM, CDE [phone]  Future DSME appointment:  No follow up at this time. He was  instructed to call back if he wants to schedule classes or an additional appointment with the dietitian.

## 2017-06-26 NOTE — Patient Instructions (Signed)
Check blood sugars 1 x day before breakfast or 2 hrs after one meal every day  Exercise: Continue routine for  45  minutes 3-4 days a week  Eat 3 meals day Space meals 4-6 hours apart Don't skip meals Add 1 serving of protein for breakfast Include 1 serving of protein and 1 serving of carbohydrate for lunch  Return for appointment/classes on:  Call back if you want to schedule classes or an additional appointment with the dietitian

## 2017-06-27 ENCOUNTER — Other Ambulatory Visit: Payer: Self-pay

## 2017-06-27 ENCOUNTER — Encounter: Payer: Self-pay | Admitting: Family Medicine

## 2017-06-27 ENCOUNTER — Ambulatory Visit (INDEPENDENT_AMBULATORY_CARE_PROVIDER_SITE_OTHER): Payer: Medicare Other | Admitting: Family Medicine

## 2017-06-27 VITALS — BP 122/80 | HR 58 | Temp 98.4°F | Ht 71.0 in | Wt 189.8 lb

## 2017-06-27 DIAGNOSIS — E1142 Type 2 diabetes mellitus with diabetic polyneuropathy: Secondary | ICD-10-CM

## 2017-06-27 DIAGNOSIS — E1140 Type 2 diabetes mellitus with diabetic neuropathy, unspecified: Secondary | ICD-10-CM

## 2017-06-27 DIAGNOSIS — I10 Essential (primary) hypertension: Secondary | ICD-10-CM | POA: Diagnosis not present

## 2017-06-27 LAB — HM DIABETES FOOT EXAM

## 2017-06-27 MED ORDER — SILDENAFIL CITRATE 20 MG PO TABS
ORAL_TABLET | ORAL | 3 refills | Status: DC
Start: 2017-06-27 — End: 2018-07-01

## 2017-06-27 NOTE — Assessment & Plan Note (Signed)
Stable control. 

## 2017-06-27 NOTE — Addendum Note (Signed)
Addended byEliezer Lofts E on: 06/27/2017 11:07 AM   Modules accepted: Orders

## 2017-06-27 NOTE — Progress Notes (Signed)
   Subjective:    Patient ID: Alexander Yang, male    DOB: Jul 12, 1940, 77 y.o.   MRN: [id number]  HPI    77 year old male presents for follow up DM.  Diabetes:   Improved control in last 3 months! On  metformin  500 mg daily.. Higher doses caused GI SE.   He has also changed his diet.. Has decreased carbohydrates in diet.  He has been seeing nutritionist.  Exercise: 3-4 days a week cardio at gym.  Lab Results  Component Value Date   HGBA1C 7.0 (H) 06/24/2017  Using medications without difficulties: Hypoglycemic episodes: none Hyperglycemic episodes: none Feet problems: no ulcers Blood Sugars averaging: FBS:  145-165 eye exam within last year: 08/2016   Blood pressure 122/80, pulse (!) 58, temperature 98.4 F (36.9 C), temperature source Oral, height 5\' 11"  (1.803 m), weight 189 lb 12 oz (86.1 kg). Social History /Family History/Past Medical History reviewed in detail and updated in EMR if needed.   Review of Systems  Constitutional: Negative for fatigue and fever.  HENT: Negative for ear pain.   Eyes: Negative for pain.  Respiratory: Negative for cough and shortness of breath.   Cardiovascular: Negative for chest pain, palpitations and leg swelling.  Gastrointestinal: Negative for abdominal pain.  Genitourinary: Negative for dysuria.  Musculoskeletal: Negative for arthralgias.  Neurological: Negative for syncope, light-headedness and headaches.  Psychiatric/Behavioral: Negative for dysphoric mood.       Objective:   Physical Exam  Constitutional: Vital signs are normal. He appears well-developed and well-nourished.  HENT:  Head: Normocephalic.  Right Ear: Hearing normal.  Left Ear: Hearing normal.  Nose: Nose normal.  Mouth/Throat: Oropharynx is clear and moist and mucous membranes are normal.  Neck: Trachea normal. Carotid bruit is not present. No thyroid mass and no thyromegaly present.  Cardiovascular: Normal rate, regular rhythm and normal pulses. Exam reveals  no gallop, no distant heart sounds and no friction rub.  No murmur heard. No peripheral edema  Pulmonary/Chest: Effort normal and breath sounds normal. No respiratory distress.  Skin: Skin is warm, dry and intact. No rash noted.  Psychiatric: He has a normal mood and affect. His speech is normal and behavior is normal. Thought content normal.    Diabetic foot exam: Normal inspection No skin breakdown No calluses  Normal DP pulses Normal sensation to light touch and monofilament Nails normal       Assessment & Plan:

## 2017-06-27 NOTE — Patient Instructions (Signed)
Keep working on diet and lifestyle changes.

## 2017-06-27 NOTE — Assessment & Plan Note (Signed)
Improiving. Cannot tolerate higher dose of metfomrin.Work on low [employer], portion size.  Follow up in 3 months.

## 2017-06-29 ENCOUNTER — Other Ambulatory Visit: Payer: Self-pay | Admitting: Family Medicine

## 2017-07-02 ENCOUNTER — Encounter: Payer: Self-pay | Admitting: Oncology

## 2017-07-02 DIAGNOSIS — C61 Malignant neoplasm of prostate: Secondary | ICD-10-CM | POA: Diagnosis not present

## 2017-07-03 ENCOUNTER — Encounter: Payer: Self-pay | Admitting: *Deleted

## 2017-07-09 ENCOUNTER — Other Ambulatory Visit: Payer: Self-pay | Admitting: Family Medicine

## 2017-07-09 ENCOUNTER — Encounter: Payer: Self-pay | Admitting: Family Medicine

## 2017-07-09 MED ORDER — LOSARTAN POTASSIUM 25 MG PO TABS: 25.0000 mg | ORAL_TABLET | Freq: Every day | ORAL | 1 refills | Status: DC

## 2017-07-10 ENCOUNTER — Inpatient Hospital Stay: Payer: Medicare Other | Attending: Oncology | Admitting: Oncology

## 2017-07-10 ENCOUNTER — Telehealth: Payer: Self-pay

## 2017-07-10 VITALS — BP 139/71 | HR 63 | Temp 98.5°F | Resp 17 | Ht 71.0 in | Wt 188.9 lb

## 2017-07-10 DIAGNOSIS — C61 Malignant neoplasm of prostate: Secondary | ICD-10-CM

## 2017-07-10 DIAGNOSIS — E291 Testicular hypofunction: Secondary | ICD-10-CM

## 2017-07-10 NOTE — Telephone Encounter (Signed)
Printed avs and calender of upcoming appointment. Per 5/1 los 

## 2017-07-10 NOTE — Progress Notes (Signed)
Hematology and Oncology Follow Up Visit  Rivan Siordia [id number] Jun 04, 1940 77 y.o. 07/10/2017 12:04 PM Bedsole, [name], [name], [name], MD   Principle Diagnosis: 77 year old man with biochemically recurrent prostate cancer with a rise in his PSA.  He was initially diagnosed in 2009 with a Gleason score 3+3 = 6 PSA of 3.17.    Prior Therapy: Observation and surveillance between 2009 in 2011. He was found to have a Gleason score 4+4 = 8 in 2011 and PSA of . He subsequently was evaluated the [place] for proton therapy. He received radiation therapy between October 2011 until December 2011. He received concomitant Taxotere chemotherapy at a low dose but it was unclear to me whether he had it as a part of a clinical trial. He also received 6 months of hormone therapy up until July 2012. His PSA nadir was down to 0.1 In December 2013. His PSA continue to be low close to 0.1 and 0.2 Up till December 2015.  In June 2016 was up to 0.4 and his most recent PSA in September 2016 was up to 0.7.    Current therapy:   Androgen deprivation therapy with Lupron at 7.5 mg on a monthly basis started in March 2018.  Last injection will be given on January 08, 2017 and has been on hold since then.   Interim History:  Mr. Whittenburg presents today for a follow-up visit.  Since last visit he reports no changes in his health.  He denies any recent hospitalizations or illnesses.  He reports no bone pain or pathological fractures.  He continues to attend activities of daily living without any decline including exercising regularly.  He reports improvement in his energy level and fatigue since holding Lupron in October 2018.  He does not report any headaches, blurry vision, seizures. Does not report any fevers, chills or sweats. Does not report any chest pain, palpitation, orthopnea or leg edema.  He denies any cough, wheezing or hemoptysis.  He denied any nausea, vomiting or abdominal pain. Denies any  frequency urgency or hesitancy.  He does not report any arthralgia or myalgias.  He does not report any heat or cold intolerance.  He does not report any lymphadenopathy or petechiae.  He does not report any skin rashes or lesions.  Remaining review of systems is negative.  Medications: I have reviewed the patient's current medications.  Current Outpatient Medications  Medication Sig Dispense Refill  . aspirin 81 MG tablet Take 1 tablet (81 mg total) by mouth daily. 30 tablet   . Blood Glucose Monitoring Suppl (ACCU-CHEK AVIVA PLUS) w/Device KIT Use to check blood sugar 2 times a day.  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 Calcium Carb-Cholecalciferol (CALCIUM 600-D PO) Take 1 tablet by mouth daily.    . Cholecalciferol (VITAMIN D3) 2000 units capsule Take 2,000 Units by mouth daily.     Marland Kitchen glucose blood (ACCU-CHEK AVIVA) test strip Use to check blood sugar 2 times a day.  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 ketoconazole (NIZORAL) 2 % cream Apply 1 application topically daily as needed for irritation.    . Lancets (ACCU-CHEK SOFT TOUCH) lancets Use to check blood sugar 2 times a day.  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 Lancets Misc. (ACCU-CHEK SOFTCLIX LANCET DEV) KIT Use to check blood sugar 2 times a day.  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 losartan (COZAAR) 25 MG tablet Take 1 tablet (25 mg total) by mouth daily. 90 tablet 1  .  metFORMIN (GLUCOPHAGE-XR) 500 MG 24 hr tablet Take 500 mg by mouth daily with breakfast.    . pravastatin (PRAVACHOL) 40 MG tablet TAKE ONE TABLET BY MOUTH ONCE DAILY 90 tablet 3  . sildenafil (REVATIO) 20 MG tablet Take 2 to 5 tablets by mouth 30 minutes prior to intercourse 50 tablet 3   Current Facility-Administered Medications  Medication Dose Route Frequency Provider Last Rate Last Dose  . betamethasone acetate-betamethasone sodium phosphate (CELESTONE) injection 3 mg  3 mg Intramuscular Once Daylene Katayama M, DPM      . betamethasone acetate-betamethasone sodium phosphate (CELESTONE) injection 3 mg  3 mg  Intramuscular Once Edrick Kins, DPM         Allergies: No Known Allergies  Past Medical History, Surgical history, Social history, and Family History reviewed and did not change.    Blood pressure 139/71, pulse 63, temperature 98.5 F (36.9 C), temperature source Oral, resp. rate 17, height _0  (1.803 m), weight 188 lb 14.4 oz (85.7 kg), SpO2 100 %.   ECOG 0.  General appearance: Alert, awake gentleman without distress. Head: Cephalic without any abnormalities. Oropharynx: Membranes are moist and pink. Eyes: Pupils are equal and round reactive to light. Resp: Clear to auscultation without any rhonchi, wheezes or dullness to percussion. Cardiovascular: Heart is regular rate without any murmurs or gallops. Abdomen: Soft, nontender without any rebound or guarding. Musculoskeletal: No clubbing or cyanosis. Skin: No skin rashes or lesions. Lymph nodes: No lymphadenopathy palpated in the cervical, supraclavicular, and axillary nodes   PSA on 04/20/2015 was 2.6.  PSA on 07/19/2015 was 3.7. PSA on 10/18/2015 was 3.2. PSA on 01/11/2016 was 4.4. PSA on 04/12/2016 was 8.2. Lupron started in March 2018. PSA on 08/23/2016 was 0.7 PSA in August 2018 was 0.7. PSA in 12/2016 0.7.  Testosterone was less than 3. PSA on April 03, 2017 was 1.5. PSA on July 02, 2017 was 5.7.  Impression and Plan:   77 year old man with  1.  Prostate cancer diagnosed in 2009 with a Gleason score 3+3 = 6 and a PSA of 3.17.  He developed biochemical recurrent disease and currently receiving intermittent androgen deprivation with Lupron monthly.  His last Lupron was given in October 2018.  His PSA obtained on July 02, 2017 was 5.7.  The natural course of this disease was discussed today with the patient and treatment options were discussed.  Resuming androgen deprivation therapy at this time or defer to his PSA above 10 was reviewed today.  After discussion today, despite the rapid rise in his PSA we have  elected to defer starting androgen deprivation for at least 3 months.  We will obtain a staging work-up at the time and recheck his PSA.  Risks and benefits of restarting Lupron at that time was discussed and is agreeable to consider that depending on the results of the PET scan and his PSA.  2. Follow-up: Will be 3 months after obtaining imaging and repeat PSA.  15  minutes was spent with the patient face-to-face today.  More than 50% of time was dedicated to patient counseling, education and answering questions regarding diagnosis, prognosis and future plan of care.  Zola Button, MD 5/1/201912:04 PM

## 2017-07-15 ENCOUNTER — Telehealth: Payer: Self-pay | Admitting: *Deleted

## 2017-07-15 NOTE — Telephone Encounter (Signed)
Faxed [facility] to [place], attn: Hal Neer; release [phone]

## 2017-08-19 LAB — HM DIABETES EYE EXAM

## 2017-08-26 ENCOUNTER — Encounter: Payer: Self-pay | Admitting: Family Medicine

## 2017-09-23 ENCOUNTER — Ambulatory Visit (HOSPITAL_COMMUNITY): Payer: Medicare Other

## 2017-09-24 ENCOUNTER — Other Ambulatory Visit: Payer: Self-pay | Admitting: Family Medicine

## 2017-09-24 ENCOUNTER — Other Ambulatory Visit (INDEPENDENT_AMBULATORY_CARE_PROVIDER_SITE_OTHER): Payer: Medicare Other

## 2017-09-24 ENCOUNTER — Telehealth: Payer: Self-pay | Admitting: Family Medicine

## 2017-09-24 DIAGNOSIS — E7800 Pure hypercholesterolemia, unspecified: Secondary | ICD-10-CM

## 2017-09-24 DIAGNOSIS — E1140 Type 2 diabetes mellitus with diabetic neuropathy, unspecified: Secondary | ICD-10-CM

## 2017-09-24 LAB — COMPREHENSIVE METABOLIC PANEL
ALBUMIN: 4 g/dL (ref 3.5–5.2)
ALK PHOS: 36 U/L — AB (ref 39–117)
ALT: 14 U/L (ref 0–53)
AST: 13 U/L (ref 0–37)
BUN: 17 mg/dL (ref 6–23)
CALCIUM: 9.3 mg/dL (ref 8.4–10.5)
CHLORIDE: 105 meq/L (ref 96–112)
CO2: 29 mEq/L (ref 19–32)
Creatinine, Ser: 0.86 mg/dL (ref 0.40–1.50)
GFR: 91.74 mL/min (ref >60.00)
Glucose, Bld: 171 mg/dL — ABNORMAL HIGH (ref 70–99)
POTASSIUM: 4.3 meq/L (ref 3.5–5.1)
SODIUM: 140 meq/L (ref 135–145)
TOTAL PROTEIN: 6.7 g/dL (ref 6.0–8.3)
Total Bilirubin: 0.7 mg/dL (ref 0.2–1.2)

## 2017-09-24 LAB — LIPID PANEL
CHOL/HDL RATIO: 3
CHOLESTEROL: 142 mg/dL (ref 0–200)
HDL: 41.5 mg/dL (ref >39.00)
LDL Cholesterol: 86 mg/dL (ref 0–99)
NONHDL: 100.91
TRIGLYCERIDES: 76 mg/dL (ref 0.0–149.0)
VLDL: 15.2 mg/dL (ref 0.0–40.0)

## 2017-09-24 LAB — HEMOGLOBIN A1C: Hgb A1c MFr Bld: 7.1 % — ABNORMAL HIGH (ref 4.6–6.5)

## 2017-09-24 NOTE — Telephone Encounter (Signed)
-----   Message from Lendon Collar, RT sent at 09/17/2017 11:17 AM EDT ----- Regarding: Lab orders for Tuesday 09/24/17 Please enter lab orders for 59month follow-up. Thanks-Lauren

## 2017-09-26 ENCOUNTER — Ambulatory Visit (HOSPITAL_COMMUNITY)
Admission: RE | Admit: 2017-09-26 | Discharge: 2017-09-26 | Disposition: A | Discharge status: Home / Self Care | Payer: Medicare Other | Source: Ambulatory Visit | Attending: Oncology | Admitting: Oncology

## 2017-09-26 DIAGNOSIS — C61 Malignant neoplasm of prostate: Secondary | ICD-10-CM | POA: Insufficient documentation

## 2017-09-26 MED ORDER — AXUMIN (FLUCICLOVINE F 18) INJECTION
9.3000 | Freq: Once | INTRAVENOUS | Status: AC
  Administered 2017-09-26: 9.3 via INTRAVENOUS

## 2017-09-27 ENCOUNTER — Ambulatory Visit (INDEPENDENT_AMBULATORY_CARE_PROVIDER_SITE_OTHER): Payer: Medicare Other | Admitting: Family Medicine

## 2017-09-27 ENCOUNTER — Encounter: Payer: Self-pay | Admitting: Family Medicine

## 2017-09-27 VITALS — BP 126/78 | HR 61 | Temp 97.8°F | Wt 186.0 lb

## 2017-09-27 DIAGNOSIS — E7800 Pure hypercholesterolemia, unspecified: Secondary | ICD-10-CM | POA: Diagnosis not present

## 2017-09-27 DIAGNOSIS — E1140 Type 2 diabetes mellitus with diabetic neuropathy, unspecified: Secondary | ICD-10-CM

## 2017-09-27 DIAGNOSIS — I10 Essential (primary) hypertension: Secondary | ICD-10-CM

## 2017-09-27 DIAGNOSIS — E1142 Type 2 diabetes mellitus with diabetic polyneuropathy: Secondary | ICD-10-CM

## 2017-09-27 LAB — HM DIABETES FOOT EXAM

## 2017-09-27 NOTE — Assessment & Plan Note (Signed)
Stable control. 

## 2017-09-27 NOTE — Assessment & Plan Note (Signed)
Well controlled. Continue current medication.  

## 2017-09-27 NOTE — Progress Notes (Signed)
   Subjective:    Patient ID: Alexander Yang, male    DOB: [birth year], 78 y.o.   MRN: [id number]  HPI   77 year old male presents for follow up.   prostate cancer: PET scan pending.  Diabetes:  Improved A1C over time but above 7. On metformin 500 mg BID x 3 weeks.. No SE. Using medications without difficulties: Hypoglycemic episodes:none Hyperglycemic episodes:none Feet problems: non uclers Blood Sugars averaging: FBS 139-155 eye exam within last year:  Elevated Cholesterol:  At goal on pravastatin Lab Results  Component Value Date   CHOL 142 09/24/2017   HDL 41.50 09/24/2017   LDLCALC 86 09/24/2017   TRIG 76.0 09/24/2017   CHOLHDL 3 09/24/2017  Using medications without problems: Muscle aches:  Diet compliance: good Exercise:3-4 times a week Other complaints:  Hypertension:   good control on losartan  BP Readings from Last 3 Encounters:  09/27/17 126/78  07/10/17 139/71  06/27/17 122/80  Using medication without problems or lightheadedness: none Chest pain with exertion:none Edema:none Short of breath:none Average home BPs: Other issues:  Body mass index is 25.94 kg/m. Wt Readings from Last 3 Encounters:  09/27/17 186 lb (84.4 kg)  07/10/17 188 lb 14.4 oz (85.7 kg)  06/27/17 189 lb 12 oz (86.1 kg)    Blood pressure 126/78, pulse 61, temperature 97.8 F (36.6 C), temperature source Oral, weight 186 lb (84.4 kg), SpO2 97 %. Social History /Family History/Past Medical History reviewed in detail and updated in EMR if needed.   Review of Systems  Constitutional: Negative for fatigue and fever.  HENT: Negative for ear pain.   Eyes: Negative for pain.  Respiratory: Negative for cough and shortness of breath.   Cardiovascular: Negative for chest pain, palpitations and leg swelling.  Gastrointestinal: Negative for abdominal pain.  Genitourinary: Negative for dysuria.  Musculoskeletal: Negative for arthralgias.  Neurological: Negative for syncope,  light-headedness and headaches.  Psychiatric/Behavioral: Negative for dysphoric mood.       Objective:   Physical Exam  Constitutional: Vital signs are normal. He appears well-developed and well-nourished.  HENT:  Head: Normocephalic.  Right Ear: Hearing normal.  Left Ear: Hearing normal.  Nose: Nose normal.  Mouth/Throat: Oropharynx is clear and moist and mucous membranes are normal.  Neck: Trachea normal. Carotid bruit is not present. No thyroid mass and no thyromegaly present.  Cardiovascular: Normal rate, regular rhythm and normal pulses. Exam reveals no gallop, no distant heart sounds and no friction rub.  No murmur heard. No peripheral edema  Pulmonary/Chest: Effort normal and breath sounds normal. No respiratory distress.  Skin: Skin is warm, dry and intact. No rash noted.  Psychiatric: He has a normal mood and affect. His speech is normal and behavior is normal. Thought content normal.          Assessment & Plan:

## 2017-09-27 NOTE — Patient Instructions (Addendum)
Try to limit bedtime sweets.or eat earlier in there.  Can try 2 tabs of metformin at  dinnertime.  Keep appt as schedule.

## 2017-09-27 NOTE — Assessment & Plan Note (Signed)
Well controlled. Continue current medication. Encouraged exercise, weight loss, healthy eating habits.  

## 2017-09-27 NOTE — Assessment & Plan Note (Addendum)
Well controlled. Continue current medication. Decrease bedtime sweet and can adjust med to try to lower AM CBGs

## 2017-10-02 DIAGNOSIS — C61 Malignant neoplasm of prostate: Secondary | ICD-10-CM | POA: Diagnosis not present

## 2017-10-10 ENCOUNTER — Telehealth: Payer: Self-pay | Admitting: Oncology

## 2017-10-10 ENCOUNTER — Inpatient Hospital Stay: Payer: Medicare Other | Attending: Oncology | Admitting: Oncology

## 2017-10-10 ENCOUNTER — Inpatient Hospital Stay: Payer: Medicare Other

## 2017-10-10 VITALS — BP 135/82 | HR 76 | Temp 98.6°F | Resp 18 | Ht 71.0 in | Wt 188.8 lb

## 2017-10-10 DIAGNOSIS — C61 Malignant neoplasm of prostate: Secondary | ICD-10-CM

## 2017-10-10 DIAGNOSIS — Z5111 Encounter for antineoplastic chemotherapy: Secondary | ICD-10-CM | POA: Insufficient documentation

## 2017-10-10 DIAGNOSIS — E291 Testicular hypofunction: Secondary | ICD-10-CM

## 2017-10-10 MED ORDER — LEUPROLIDE ACETATE 7.5 MG IM KIT
7.5000 mg | PACK | Freq: Once | INTRAMUSCULAR | Status: AC
  Administered 2017-10-10: 7.5 mg via INTRAMUSCULAR
  Filled 2017-10-10: qty 7.5

## 2017-10-10 NOTE — Progress Notes (Signed)
Hematology and Oncology Follow Up Visit  Alexander Yang [id number] 1940-04-07 77 y.o. 10/10/2017 2:57 PM Bedsole, [name], [name], [name], MD   Principle Diagnosis: 77 year old man with prostate cancer initially diagnosed in 2009 with a Gleason score 3+3 equal 6 and a PSA of 3.17.  He has currently biochemical relapse without any measurable disease.    Prior Therapy: Observation and surveillance between 2009 in 2011.  He was found to have a Gleason score 4+4 = 8 in 2011. He received radiation therapy between October 2011 until December 2011.   He received concomitant Taxotere chemotherapy as a part of a clinical trial. He also received 6 months of hormone therapy up until July 2012. His PSA nadir was down to 0.1 In December 2013.   His PSA continue to be low close to 0.1 and 0.2 Up till December 2015.   In June 2016 was up to 0.4 and his most recent PSA in September 2016 was up to 0.7.    Current therapy:   Androgen deprivation therapy with Lupron at 7.5 mg on a monthly basis started in March 2018.  Last injection will be given on January 08, 2017 and has been on hold since that time.   Interim History:  Alexander Yang is here for a follow-up visit.  Since her last visit, he reports no major changes in his health.  He continues to be active and works out regularly.  He denies any back back pain or bone pain.  He does report some knee pain at times.  His urination has not been changed and does not report any diarrhea.  His performance status and quality of life remain excellent.  He does not report any headaches, blurry vision, seizures.  He denies any alteration mental status or confusion.  Does not report any fevers, chills or sweats. Does not report any chest pain, palpitation, orthopnea or leg edema.  He denies any cough, wheezing or hemoptysis.  He denied any nausea, vomiting or abdominal pain.  He denies any early satiety or distention.  Denies any frequency urgency or hesitancy.  He  does not report any bone pain or pathological fractures.  He does not report any lymphadenopathy or petechiae.  He denies any bleeding or clotting tendency.  He does not report any skin rashes or lesions.  Remaining review of systems is negative.  Medications: I have reviewed the patient's current medications.  Current Outpatient Medications  Medication Sig Dispense Refill  . aspirin 81 MG tablet Take 1 tablet (81 mg total) by mouth daily. 30 tablet   . Blood Glucose Monitoring Suppl (ACCU-CHEK AVIVA PLUS) w/Device KIT Use to check blood sugar 2 times a day.  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 Calcium Carb-Cholecalciferol (CALCIUM 600-D PO) Take 1 tablet by mouth daily.    . Cholecalciferol (VITAMIN D3) 2000 units capsule Take 2,000 Units by mouth daily.     Marland Kitchen glucose blood (ACCU-CHEK AVIVA) test strip Use to check blood sugar 2 times a day.  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 ketoconazole (NIZORAL) 2 % cream Apply 1 application topically daily as needed for irritation.    . Lancets (ACCU-CHEK SOFT TOUCH) lancets Use to check blood sugar 2 times a day.  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 Lancets Misc. (ACCU-CHEK SOFTCLIX LANCET DEV) KIT Use to check blood sugar 2 times a day.  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 losartan (COZAAR) 25 MG tablet Take 1 tablet (25 mg total) by mouth  daily. 90 tablet 1  . metFORMIN (GLUCOPHAGE-XR) 500 MG 24 hr tablet Take 500 mg by mouth 2 (two) times daily.     . pravastatin (PRAVACHOL) 40 MG tablet TAKE 1 TABLET BY MOUTH ONCE DAILY 90 tablet 0  . sildenafil (REVATIO) 20 MG tablet Take 2 to 5 tablets by mouth 30 minutes prior to intercourse 50 tablet 3   Current Facility-Administered Medications  Medication Dose Route Frequency Provider Last Rate Last Dose  . betamethasone acetate-betamethasone sodium phosphate (CELESTONE) injection 3 mg  3 mg Intramuscular Once Daylene Katayama M, DPM      . betamethasone acetate-betamethasone sodium phosphate (CELESTONE) injection 3 mg  3 mg Intramuscular Once Edrick Kins,  DPM         Allergies: No Known Allergies  Past Medical History, Surgical history, Social history, and Family History reviewed and did not change.    Physical exam: Blood pressure 135/82, pulse 76, temperature 98.6 F (37 C), temperature source Oral, resp. rate 18, height 5' 11"  (1.803 m), weight 188 lb 12.8 oz (85.6 kg), SpO2 99 %.   ECOG 0.  General appearance: Well-appearing gentleman without distress. Head: Atraumatic without abnormalities. Oropharynx: Oral mucosa is moist and pink without thrush. Eyes: Sclera anicteric. Resp: Clear that any rhonchi, wheezes or dullness to percussion. Cardiovascular: Regular rate noted without any murmurs or gallops.  No leg edema. Abdomen: Soft without any rebound or guarding.  No shifting dullness or ascites. Musculoskeletal: No joint deformity or effusion. Skin: No ecchymosis or petechiae. Lymph nodes: No cervical, supraclavicular, or axillary adenopathy.  CLINICAL DATA:  Prostate carcinoma with biochemical recurrence. Rising PSA equal 5.7.  EXAM: NUCLEAR MEDICINE PET SKULL BASE TO THIGH  TECHNIQUE: 9.3 mCi F-18 Fluciclovine was injected intravenously. Full-ring PET imaging was performed from the skull base to thigh after the radiotracer. CT data was obtained and used for attenuation correction and anatomic localization.  COMPARISON:  None.  FINDINGS: NECK  No radiotracer activity in neck lymph nodes.  Incidental CT finding: None  CHEST  No radiotracer accumulation within mediastinal or hilar lymph nodes. No suspicious pulmonary nodules on the CT scan.  Incidental CT finding: Small 2 mm nodule in the RIGHT lower lobe (image 34/80 is unchanged  ABDOMEN/PELVIS  Prostate: Intense focal activity localizing within the mid RIGHT lateral prostate gland is increased from comparison exam with SUV max equal 7.4 compared SUV max equal 4.6.  Lymph nodes: No radiotracer accumulation within pelvic lymph nodes. Small  5 mm LEFT external iliac lymph node (image 172/2 is unchanged. No periaortic aortic adenopathy.  Liver: No evidence of liver metastasis  Incidental CT finding: None  SKELETON  No focal  activity to suggest skeletal metastasis.  IMPRESSION: 1. Intense focal activity within the RIGHT lateral mid prostate gland is concerning for residual/recurrent prostate carcinoma. Lesion redemonstrated from comparison exam with increase intensity. 2. No evidence of metastatic adenopathy in the abdomen pelvis. 3. No evidence of distant metastatic disease or skeletal disease   PSA on 04/20/2015 was 2.6.  PSA on 07/19/2015 was 3.7. PSA on 10/18/2015 was 3.2. PSA on 01/11/2016 was 4.4. PSA on 04/12/2016 was 8.2. Lupron started in March 2018. PSA on 08/23/2016 was 0.7 PSA in August 2018 was 0.7. PSA in 12/2016 0.7.  Testosterone was less than 3. PSA on April 03, 2017 was 1.5. PSA on July 02, 2017 was 5.7. PSA on 10/02/2017 was 13.7     Impression and Plan:   77 year old man with  1.  Biochemically recurring prostate  cancer without any measurable disease.  His initial diagnosis was in 2009 and is status post the treatment listed above.   He has been on intermittent androgen deprivation with PSA showed excellent response in 2018 and currently rising with a rapid doubling time.  His PSA is above 10 although he is asymptomatic.  His PET scan obtained on 09/26/2017 was personally reviewed and discussed with the patient which showed no evidence of metastatic spread but does have local recurrence from his prostate which is unchanged.    The natural course of this disease and treatment options were reviewed today.  These options would include continue active surveillance versus resuming androgen deprivation.  Local therapy with a salvage prostatectomy was also considered.  After discussion today, he elected to resume androgen deprivation therapy alone and he will receive it on a monthly basis  starting on 10/10/2017.  2. Follow-up: Will be 3 months after obtaining repeat PSA.  15  minutes was spent with the patient face-to-face today.  More than 50% of time was dedicated to patient counseling, education and answering questions regarding diagnosis, prognosis and future plan of care.  Zola Button, MD 8/1/20192:57 PM

## 2017-10-10 NOTE — Patient Instructions (Signed)

## 2017-10-10 NOTE — Telephone Encounter (Signed)
Appt s scheduled AVS/Calendar printed per 8/1 los

## 2017-10-21 ENCOUNTER — Other Ambulatory Visit: Payer: Self-pay | Admitting: Family Medicine

## 2017-10-23 ENCOUNTER — Telehealth: Payer: Self-pay | Admitting: Oncology

## 2017-10-23 NOTE — Telephone Encounter (Signed)
FAXED OFFICE NOTE TO [facility] [phone]

## 2017-10-25 ENCOUNTER — Encounter: Payer: Self-pay | Admitting: Oncology

## 2017-10-29 ENCOUNTER — Other Ambulatory Visit: Payer: Self-pay | Admitting: *Deleted

## 2017-10-29 DIAGNOSIS — C61 Malignant neoplasm of prostate: Secondary | ICD-10-CM

## 2017-10-29 MED ORDER — LEUPROLIDE ACETATE 7.5 MG IM KIT: 7.5000 mg | PACK | INTRAMUSCULAR | 0 refills | Status: DC

## 2017-11-07 ENCOUNTER — Inpatient Hospital Stay: Payer: Medicare Other

## 2017-11-07 VITALS — BP 150/71 | HR 53 | Resp 18

## 2017-11-07 DIAGNOSIS — C61 Malignant neoplasm of prostate: Secondary | ICD-10-CM | POA: Diagnosis not present

## 2017-11-07 DIAGNOSIS — Z5111 Encounter for antineoplastic chemotherapy: Secondary | ICD-10-CM | POA: Diagnosis not present

## 2017-11-07 MED ORDER — LEUPROLIDE ACETATE 7.5 MG IM KIT
7.5000 mg | PACK | Freq: Once | INTRAMUSCULAR | Status: AC
  Administered 2017-11-07: 7.5 mg via INTRAMUSCULAR
  Filled 2017-11-07: qty 7.5

## 2017-11-28 ENCOUNTER — Encounter: Payer: Self-pay | Admitting: Family Medicine

## 2017-11-28 ENCOUNTER — Ambulatory Visit (INDEPENDENT_AMBULATORY_CARE_PROVIDER_SITE_OTHER): Payer: Medicare Other | Admitting: Family Medicine

## 2017-11-28 VITALS — BP 140/80 | HR 58 | Temp 97.9°F | Ht 71.0 in | Wt 186.5 lb

## 2017-11-28 DIAGNOSIS — R220 Localized swelling, mass and lump, head: Secondary | ICD-10-CM

## 2017-11-28 DIAGNOSIS — Z87891 Personal history of nicotine dependence: Secondary | ICD-10-CM

## 2017-11-28 DIAGNOSIS — K148 Other diseases of tongue: Secondary | ICD-10-CM

## 2017-11-28 NOTE — Patient Instructions (Signed)
Please stop at the front desk to set up referral.  

## 2017-11-28 NOTE — Progress Notes (Signed)
   Subjective:    Patient ID: Alexander Yang, male    DOB: 1940-03-29, 77 y.o.   MRN: [id number]  HPI   77 year old former remote smoker with diabetes presents with new onset lump on his tounge. No past smokeless tobacco.   He first note the lesion  1 week ago, seemed to be more noticeable in last 2-3 days. More sore.  On top in middle. No change in size, no discharge.  No ST, no cough, congestion, no fever. No SOB. Feels well other wise.   No new meds, no new oral appliances.  No new foods.   Blood pressure 140/80, pulse (!) 58, temperature 97.9 F (36.6 C), temperature source Oral, height 5\' 11"  (1.803 m), weight 186 lb 8 oz (84.6 kg). Social History /Family History/Past Medical History reviewed in detail and updated in EMR if needed.   Review of Systems  Constitutional: Negative for fatigue and fever.  HENT: Negative for ear pain.   Eyes: Negative for pain.  Respiratory: Negative for cough and shortness of breath.   Cardiovascular: Negative for chest pain, palpitations and leg swelling.  Gastrointestinal: Negative for abdominal pain.  Genitourinary: Negative for dysuria.  Musculoskeletal: Negative for arthralgias.  Neurological: Negative for syncope, light-headedness and headaches.  Psychiatric/Behavioral: Negative for dysphoric mood.       Objective:   Physical Exam  Constitutional: Vital signs are normal. He appears well-developed and well-nourished.  HENT:  Head: Normocephalic.  Right Ear: Hearing normal.  Left Ear: Hearing normal.  Nose: Nose normal.  Mouth/Throat: Oropharynx is clear and moist and mucous membranes are normal. Oral lesions present.    Pea size round, mucosa colored ttp lesion nonmobile on right aspect of tounge  Neck: Trachea normal. Carotid bruit is not present. No thyroid mass and no thyromegaly present.  Cardiovascular: Normal rate, regular rhythm and normal pulses. Exam reveals no gallop, no distant heart sounds and no friction rub.  No  murmur heard. No peripheral edema  Pulmonary/Chest: Effort normal and breath sounds normal. No respiratory distress.  Lymphadenopathy:       Head (right side): No submental, no submandibular, no tonsillar, no preauricular, no posterior auricular and no occipital adenopathy present.       Head (left side): No submental, no submandibular, no tonsillar, no preauricular, no posterior auricular and no occipital adenopathy present.    He has no cervical adenopathy.       Right cervical: No superficial cervical adenopathy present.      Left cervical: No superficial cervical adenopathy present.       Right: No supraclavicular adenopathy present.       Left: No supraclavicular adenopathy present.  Skin: Skin is warm, dry and intact. No rash noted.  Psychiatric: He has a normal mood and affect. His speech is normal and behavior is normal. Thought content normal.          Assessment & Plan:

## 2017-11-28 NOTE — Assessment & Plan Note (Signed)
No red flags, but pt former smoker. Very uniform, not clearly inflamed taste bud, no sign of infection. ? Cystic structure NO adenopathy.  Refer to ENT for recommendations on following vs Bx.

## 2017-12-04 DIAGNOSIS — Z23 Encounter for immunization: Secondary | ICD-10-CM | POA: Diagnosis not present

## 2017-12-05 ENCOUNTER — Inpatient Hospital Stay: Payer: Medicare Other | Attending: Oncology

## 2017-12-05 DIAGNOSIS — C61 Malignant neoplasm of prostate: Secondary | ICD-10-CM | POA: Diagnosis not present

## 2017-12-05 DIAGNOSIS — Z5111 Encounter for antineoplastic chemotherapy: Secondary | ICD-10-CM | POA: Insufficient documentation

## 2017-12-05 MED ORDER — LEUPROLIDE ACETATE 7.5 MG IM KIT
7.5000 mg | PACK | Freq: Once | INTRAMUSCULAR | Status: AC
  Administered 2017-12-05: 7.5 mg via INTRAMUSCULAR
  Filled 2017-12-05: qty 7.5

## 2017-12-09 DIAGNOSIS — B379 Candidiasis, unspecified: Secondary | ICD-10-CM | POA: Diagnosis not present

## 2017-12-09 DIAGNOSIS — D3702 Neoplasm of uncertain behavior of tongue: Secondary | ICD-10-CM | POA: Diagnosis not present

## 2018-01-01 DIAGNOSIS — C61 Malignant neoplasm of prostate: Secondary | ICD-10-CM | POA: Diagnosis not present

## 2018-01-06 DIAGNOSIS — D3702 Neoplasm of uncertain behavior of tongue: Secondary | ICD-10-CM | POA: Diagnosis not present

## 2018-01-06 DIAGNOSIS — H6123 Impacted cerumen, bilateral: Secondary | ICD-10-CM | POA: Diagnosis not present

## 2018-01-06 DIAGNOSIS — H9319 Tinnitus, unspecified ear: Secondary | ICD-10-CM | POA: Diagnosis not present

## 2018-01-09 ENCOUNTER — Inpatient Hospital Stay (HOSPITAL_BASED_OUTPATIENT_CLINIC_OR_DEPARTMENT_OTHER): Payer: Medicare Other | Admitting: Oncology

## 2018-01-09 ENCOUNTER — Inpatient Hospital Stay: Payer: Medicare Other | Attending: Oncology

## 2018-01-09 ENCOUNTER — Telehealth: Payer: Self-pay | Admitting: Oncology

## 2018-01-09 VITALS — BP 169/74 | HR 64 | Temp 97.7°F | Resp 17 | Ht 71.0 in | Wt 185.2 lb

## 2018-01-09 DIAGNOSIS — R197 Diarrhea, unspecified: Secondary | ICD-10-CM

## 2018-01-09 DIAGNOSIS — C61 Malignant neoplasm of prostate: Secondary | ICD-10-CM

## 2018-01-09 DIAGNOSIS — Z5111 Encounter for antineoplastic chemotherapy: Secondary | ICD-10-CM | POA: Diagnosis not present

## 2018-01-09 MED ORDER — LEUPROLIDE ACETATE 7.5 MG IM KIT
7.5000 mg | PACK | Freq: Once | INTRAMUSCULAR | Status: AC
  Administered 2018-01-09: 7.5 mg via INTRAMUSCULAR
  Filled 2018-01-09: qty 7.5

## 2018-01-09 NOTE — Patient Instructions (Signed)

## 2018-01-09 NOTE — Progress Notes (Signed)
Hematology and Oncology Follow Up Visit  Alexander Yang [id number] Jul 30, 1940 77 y.o. 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  He was initially diagnosed in 2009 with a Gleason score 3+3 equal 6 and a PSA of 3.17.   Prior Therapy: Observation and surveillance between 2009 in 2011.  He was found to have a Gleason score 4+4 = 8 in 2011. He received radiation therapy between October 2011 until December 2011.   He received concomitant Taxotere chemotherapy as a part of a clinical trial. He also received 6 months of hormone therapy up until July 2012. His PSA nadir was down to 0.1 In December 2013.   His PSA continue to be low close to 0.1 and 0.2 Up till December 2015.   In June 2016 was up to 0.4 and his most recent PSA in September 2016 was up to 0.7.    Current therapy:   Androgen deprivation therapy with Lupron at 7.5 mg on a monthly basis started in March 2018.  Last injection will be given on January 08, 2017 and has been on hold since that time. Lupron 7.5 mg monthly resumed on October 10, 2017.   Interim History:  Alexander Yang returns today for a repeat evaluation.  Since the last visit, he received Lupron on a monthly basis without any new complications.  He does report some hot flashes and mild fatigue but for the most part these symptoms are manageable.  Continues to be active and attends to activities of daily living.  He still hindered by diarrhea early in the morning and that has been chronic in nature since he received radiation therapy.  He reports that he has multiple loose bowel movements before noon and does take Imodium to control it.  He reports no hematochezia or melena.  His weight and appetite remain stable.  He does not report any headaches, blurry vision, seizures.  He denies any confusion or lethargy. Does not report any fevers, chills or  sweats. Does not report any chest pain, palpitation, orthopnea or leg edema.  He denies any cough, wheezing or hemoptysis.  He denied any nausea, vomiting or abdominal pain.  He denies any other changes in his bowel habits.  Denies any frequency urgency or hesitancy.  He does not report any arthralgias or myalgias.  He does not report any lymphadenopathy or petechiae.  He denies any lymphadenopathy or petechiae.  He does not report any skin rashes or lesions.  Denies any mood changes.  Remaining review of systems is negative.  Medications: I have reviewed the patient's current medications.  Current Outpatient Medications  Medication Sig Dispense Refill  . aspirin 81 MG tablet Take 1 tablet (81 mg total) by mouth daily. 30 tablet   . Blood Glucose Monitoring Suppl (ACCU-CHEK AVIVA PLUS) w/Device KIT Use to check blood sugar 2 times a day.  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 Calcium Carb-Cholecalciferol (CALCIUM 600-D PO) Take 1 tablet by mouth daily.    . Cholecalciferol (VITAMIN D3) 2000 units capsule Take 2,000 Units by mouth daily.     Marland Kitchen glucose blood (ACCU-CHEK AVIVA) test strip Use to check blood sugar 2 times a day.  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 ketoconazole (NIZORAL) 2 % cream Apply 1 application topically daily as needed for irritation.    . Lancets (ACCU-CHEK SOFT TOUCH) lancets Use to check blood sugar 2 times a day.  Dx:  E11.49 100 each 11  . Lancets Misc. (ACCU-CHEK SOFTCLIX LANCET DEV) KIT Use to check blood sugar 2 times a day.  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 leuprolide (LUPRON) 7.5 MG injection Inject 7.5 mg into the muscle every 28 (twenty-eight) days. 1 each 0  . losartan (COZAAR) 25 MG tablet TAKE 1 TABLET BY MOUTH ONCE DAILY 90 tablet 1  . metFORMIN (GLUCOPHAGE-XR) 500 MG 24 hr tablet Take 500 mg by mouth 2 (two) times daily.     . pravastatin (PRAVACHOL) 40 MG tablet TAKE 1 TABLET BY MOUTH ONCE DAILY 90 tablet 0  . sildenafil (REVATIO) 20 MG tablet Take 2 to 5 tablets by mouth 30 minutes prior to  intercourse 50 tablet 3   Current Facility-Administered Medications  Medication Dose Route Frequency Provider Last Rate Last Dose  . betamethasone acetate-betamethasone sodium phosphate (CELESTONE) injection 3 mg  3 mg Intramuscular Once Daylene Katayama M, DPM      . betamethasone acetate-betamethasone sodium phosphate (CELESTONE) injection 3 mg  3 mg Intramuscular Once Edrick Kins, DPM         Allergies: No Known Allergies  Past Medical History, Surgical history, Social history, and Family History reviewed and did not change.    Physical exam: Blood pressure (!) 169/74, pulse 64, temperature 97.7 F (36.5 C), temperature source Oral, resp. rate 17, height 5' 11"  (1.803 m), weight 185 lb 3.2 oz (84 kg), SpO2 100 %.    ECOG 0.    General appearance: Comfortable appearing without any discomfort Head: Normocephalic without any trauma Oropharynx: Mucous membranes are moist and pink without any thrush or ulcers. Eyes: Pupils are equal and round reactive to light. Lymph nodes: No cervical, supraclavicular, inguinal or axillary lymphadenopathy.   Heart:regular rate and rhythm.  S1 and S2 without leg edema. Lung: Clear without any rhonchi or wheezes.  No dullness to percussion. Abdomin: Soft, nontender, nondistended with good bowel sounds.  No hepatosplenomegaly. Musculoskeletal: No joint deformity or effusion.  Full range of motion noted. Neurological: No deficits noted on motor, sensory and deep tendon reflex exam. Skin: No petechial rash or dryness.  Appeared moist.    PSA on 04/20/2015 was 2.6.  PSA on 07/19/2015 was 3.7. PSA on 10/18/2015 was 3.2. PSA on 01/11/2016 was 4.4. PSA on 04/12/2016 was 8.2. Lupron started in March 2018. PSA on 08/23/2016 was 0.7 PSA in August 2018 was 0.7. PSA in 12/2016 0.7.  Testosterone was less than 3. PSA on April 03, 2017 was 1.5. PSA on July 02, 2017 was 5.7. PSA on 10/02/2017 was 13.7 PSA 01/02/3018 3.9    Impression and  Plan:   77 year old man with  1.  Prostate cancer diagnosed in 2009, currently developed biochemical relapse.     He has resumed Lupron in August 2019 on a monthly basis without any major complications.  His PSA did respond appropriately currently down to 3.9.  Risks and benefits of continuing this approach was reviewed today is agreeable to continue.  We will continue to check his PSA every 3 months with monthly Lupron.  2.  Chronic diarrhea: Appears to be more troublesome for him as of late.  This is related to previous radiation therapy.  He had requested referral to gastroenterology for reevaluation and also evaluation for possible repeat colonoscopy.  His last colonoscopy was 9 years ago.  3. Follow-up: Will be monthly for Lupron and in 3 months for an MD follow-up.  15  minutes was spent with the patient face-to-face today.  More than 50% of time was dedicated to reviewing his disease status, treatment options and coordinating plan of care.  Zola Button, MD 10/31/201910:24 AM

## 2018-01-09 NOTE — Telephone Encounter (Signed)
Gave pt avs and calendar  °

## 2018-01-10 ENCOUNTER — Ambulatory Visit (INDEPENDENT_AMBULATORY_CARE_PROVIDER_SITE_OTHER): Payer: Medicare Other | Admitting: Gastroenterology

## 2018-01-10 ENCOUNTER — Encounter: Payer: Self-pay | Admitting: Gastroenterology

## 2018-01-10 VITALS — BP 124/74 | HR 60 | Ht 71.0 in | Wt 185.1 lb

## 2018-01-10 DIAGNOSIS — R0989 Other specified symptoms and signs involving the circulatory and respiratory systems: Secondary | ICD-10-CM | POA: Diagnosis not present

## 2018-01-10 DIAGNOSIS — K529 Noninfective gastroenteritis and colitis, unspecified: Secondary | ICD-10-CM

## 2018-01-10 DIAGNOSIS — R198 Other specified symptoms and signs involving the digestive system and abdomen: Secondary | ICD-10-CM

## 2018-01-10 DIAGNOSIS — Z86010 Personal history of colonic polyps: Secondary | ICD-10-CM | POA: Diagnosis not present

## 2018-01-10 MED ORDER — PEG-KCL-NACL-NASULF-NA ASC-C 140 G PO SOLR: 140.0000 g | ORAL | 0 refills | Status: DC

## 2018-01-10 MED ORDER — DICYCLOMINE HCL 10 MG PO CAPS: 10.0000 mg | ORAL_CAPSULE | ORAL | 0 refills | Status: DC

## 2018-01-10 NOTE — Patient Instructions (Signed)
If you are age 77 or older, your body mass index should be between 23-30. Your Body mass index is 25.82 kg/m. If this is out of the aforementioned range listed, please consider follow up with your Primary Care Provider.  If you are age 71 or younger, your body mass index should be between 19-25. Your Body mass index is 25.82 kg/m. If this is out of the aformentioned range listed, please consider follow up with your Primary Care Provider.   You have been scheduled for a colonoscopy. Please follow written instructions given to you at your visit today.  Please pick up your prep supplies at the pharmacy within the next 1-3 days. If you use inhalers (even only as needed), please bring them with you on the day of your procedure. Your physician has requested that you go to www.startemmi.com and enter the access code given to you at your visit today. This web site gives a general overview about your procedure. However, you should still follow specific instructions given to you by our office regarding your preparation for the procedure.  You have been scheduled for an abdominal ultrasound at [facility] (1st floor of hospital) on 01-15-2018 at 10:30am. Please arrive 15 minutes prior to your appointment for registration. Make certain not to have anything to eat or drink 6 hours prior to your appointment. Should you need to reschedule your appointment, please contact radiology at [phone]. This test typically takes about 30 minutes to perform.   It was a pleasure to see you today!  Dr. Loletha Carrow

## 2018-01-10 NOTE — Progress Notes (Signed)
Alexander Yang 01/10/2018  Referring physician: Jinny Sanders, MD  Reason for consult/chief complaint: Diarrhea ("cow poop"-soft stools); Rectal Urgency; and Rectal Bleeding ("in the past"-not currently. From radiation)   Subjective  HPI:  This is a very pleasant 77 year old man referred by primary care for chronic alteration of bowel habits. He reports that ever since prostate cancer treatment he will have soft "mushy" stool in the morning, usually several times before noon.  He does not have loose and watery stool, but there is some urgency much of the time.  If he has to go somewhere, he might wear depends and take some Imodium. His appetite is been good, weight stable, and he denies rectal bleeding.  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  There is no indication that random colon biopsies were obtained on that exam, or if the patient was having diarrhea at the time. Normal colonoscopy (other than diverticulosis) by Dr. Deatra Ina 05/2007.  Oncology note from yesterday indicates history of prostate cancer treated with radiation in 2011, then Taxotere chemotherapy in a clinical trial in 2012, than intermittent Lupron for about the last 18 months. This note described the patient's complaint of chronic diarrhea ever since his radiation therapy, characterized by loose stools multiple times before noon requiring Imodium for control.  ROS:  Review of Systems  Constitutional: Negative for appetite change and unexpected weight change.  HENT: Negative for mouth sores and voice change.   Eyes: Negative for pain and redness.  Respiratory: Negative for cough and shortness of breath.   Cardiovascular: Negative for  chest pain and palpitations.  Genitourinary: Negative for dysuria and hematuria.  Musculoskeletal: Negative for arthralgias and myalgias.  Skin: Negative for pallor and rash.  Neurological: Negative for weakness and headaches.       Occasional dysarthria and word finding difficulty since prior CVA  Hematological: Negative for adenopathy.     Past Medical History: Past Medical History:  Diagnosis Date  . Cerebrovascular accident ([place])   . Diabetes mellitus without complication ([place])    controlled with diet  . Hypertension   . [place] (NI[facility]) Stroke Scale dysarthria score 1, mild to moderate dysarthria, patient slurs at least some words and, at worst, can be understood with some difficulty 1980  . Osteoporosis   . Prostate cancer [facility])      Past Surgical History: Past Surgical History:  Procedure Laterality Date  . ACL Tear  03/12/1978  . PROSTATE BIOPSY    . TONSILLECTOMY  03/13/1947  . VASECTOMY       Family History: Family History  Problem Relation Age of Onset  . Cerebral aneurysm Mother   . Diabetes Father   . Bradycardia Father   . Benign prostatic hyperplasia Father   . Supraventricular tachycardia Brother   . Supraventricular tachycardia Brother     Social History: Social History   Socioeconomic History  . Marital status: Married    Spouse name: Not on file  . Number of children: 2  . Years of education: Not on file  . Highest education level: Not on file  Occupational History  . Occupation: Retired  Scientific laboratory technician  . Financial resource strain: Not on file  . Food insecurity:    Worry: Not on file  Inability: Not on file  . Transportation needs:    Medical: Not on file    Non-medical: Not on file  Tobacco Use  . Smoking status: Former Smoker    Packs/day: 2.00    Years: 10.00    Pack years: 20.00    Types: Cigarettes    Last attempt to quit: 03/12/1965    Years since quitting: 52.8  . Smokeless tobacco: Never Used    Substance and Sexual Activity  . Alcohol use: Yes    Alcohol/week: 1.0 standard drinks    Types: 1 Glasses of wine per week    Comment: Glass of wine daily  . Drug use: No  . Sexual activity: Never  Lifestyle  . Physical activity:    Days per week: Not on file    Minutes per session: Not on file  . Stress: Not on file  Relationships  . Social connections:    Talks on phone: Not on file    Gets together: Not on file    Attends religious service: Not on file    Active member of club or organization: Not on file    Attends meetings of clubs or organizations: Not on file    Relationship status: Not on file  Other Topics Concern  . Not on file  Social History Narrative   Exercising: 3-4 times a week.   Healthy eating habits.   Full Code   Has a living will, has HCPOA: Physiological scientist (reviewed 2015)    Allergies: No Known Allergies  Outpatient Meds: Current Outpatient Medications  Medication Sig Dispense Refill  . aspirin 81 MG tablet Take 1 tablet (81 mg total) by mouth daily. (Patient taking differently: Take 81 mg by mouth every other day. ) 30 tablet   . Blood Glucose Monitoring Suppl (ACCU-CHEK AVIVA PLUS) w/Device KIT Use to check blood sugar 2 times a day.  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 CALCIUM-VITAMIN D PO Take 1 tablet by mouth daily.    . Glucosamine 500 MG CAPS Take by mouth daily.    Marland Kitchen glucose blood (ACCU-CHEK AVIVA) test strip Use to check blood sugar 2 times a day.  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 ketoconazole (NIZORAL) 2 % cream Apply 1 application topically daily as needed for irritation.    . Lancets (ACCU-CHEK SOFT TOUCH) lancets Use to check blood sugar 2 times a day.  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 Lancets Misc. (ACCU-CHEK SOFTCLIX LANCET DEV) KIT Use to check blood sugar 2 times a day.  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 leuprolide (LUPRON) 7.5 MG injection Inject 7.5 mg into the muscle every 28 (twenty-eight) days. 1 each 0  . losartan (COZAAR) 25 MG tablet TAKE 1 TABLET BY MOUTH ONCE  DAILY 90 tablet 1  . metFORMIN (GLUCOPHAGE-XR) 500 MG 24 hr tablet Take 500 mg by mouth 2 (two) times daily.     . pravastatin (PRAVACHOL) 40 MG tablet TAKE 1 TABLET BY MOUTH ONCE DAILY 90 tablet 0  . Probiotic Product (PROBIOTIC-10 PO) Take by mouth daily.    . sildenafil (REVATIO) 20 MG tablet Take 2 to 5 tablets by mouth 30 minutes prior to intercourse 50 tablet 3  . dicyclomine (BENTYL) 10 MG capsule Take 1 capsule (10 mg total) by mouth every morning. 30 capsule 0  . PEG-KCl-NaCl-NaSulf-Na Asc-C (PLENVU) 140 g SOLR Take 140 g by mouth as directed. 1 each 0   No current facility-administered medications for this visit.  ___________________________________________________________________ Objective   Exam:  BP 124/74   Pulse 60   Ht _0  (1.803 m)   Wt 185 lb 2 oz (84 kg)   BMI 25.82 kg/m    General: this is a(n) well-appearing man, pleasant and conversational, good muscle mass  Eyes: sclera anicteric, no redness  ENT: oral mucosa moist without lesions, no cervical or supraclavicular lymphadenopathy, good dentition  CV: RRR without murmur, S1/S2, no JVD, no peripheral edema  Resp: clear to auscultation bilaterally, normal RR and effort noted  GI: soft, no tenderness, with active bowel sounds. No guarding or palpable organomegaly noted.  Prominent aortic pulsation  Skin; warm and dry, no rash or jaundice noted  Neuro: awake, alert and oriented x 3. Normal gross motor function and fluent speech  Labs:  No CBC for review  Last CMP normal 09/24/2017 Hemoglobin A1c 7.1  cologuard negative Jan 2019  Radiologic Studies:  Abdominal ultrasound done in 2010 for abdominal pain.  No aneurysm seen  Assessment: Encounter Diagnoses  Name Primary?  . Personal history of colonic polyps Yes  . Prominent abdominal aortic pulsation   . Tenesmus   . Chronic diarrhea     Long-standing altered bowel habits since chemotherapy and radiation for prostate cancer.   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  History of polyp that was presumably adenomatous in 2012.  He most appropriate for surveillance colonoscopy, since I explained that Cologuard is for those without a history of polyps.  Plan:  Colonoscopy.  He is agreeable after discussion of procedure and risks  The benefits and risks of the planned procedure were described in detail with the patient or (when appropriate) their health care proxy.  Risks were outlined as including, but not limited to, bleeding, infection, perforation, adverse medication reaction leading to cardiac or pulmonary decompensation, or pancreatitis (if ERCP).  The limitation of incomplete mucosal visualization was also discussed.  No guarantees or warranties were given.  Abdominal ultrasound to rule out aortic aneurysm  Trial of low-dose dicyclomine 10 mg in morning.  He does not have glaucoma, however I cautioned him about typical side effects, specifically let me know if he develops blurred vision, dizziness or fatigue.  Thank you for the courtesy of this consult.  Please call me with any questions or concerns.  Nelida Meuse III  CC: Jinny Sanders, MD

## 2018-01-15 ENCOUNTER — Ambulatory Visit (HOSPITAL_COMMUNITY)
Admission: RE | Admit: 2018-01-15 | Discharge: 2018-01-15 | Disposition: A | Discharge status: Home / Self Care | Payer: Medicare Other | Source: Ambulatory Visit | Attending: Gastroenterology | Admitting: Gastroenterology

## 2018-01-15 DIAGNOSIS — R198 Other specified symptoms and signs involving the digestive system and abdomen: Secondary | ICD-10-CM

## 2018-01-15 DIAGNOSIS — R0989 Other specified symptoms and signs involving the circulatory and respiratory systems: Secondary | ICD-10-CM

## 2018-01-15 DIAGNOSIS — Z86010 Personal history of colonic polyps: Secondary | ICD-10-CM

## 2018-01-15 DIAGNOSIS — K529 Noninfective gastroenteritis and colitis, unspecified: Secondary | ICD-10-CM

## 2018-01-17 MED ORDER — BENZONATATE 200 MG PO CAPS: 200.0000 mg | ORAL_CAPSULE | Freq: Two times a day (BID) | ORAL | 0 refills | Status: DC | PRN

## 2018-01-21 ENCOUNTER — Encounter: Payer: Medicare Other | Admitting: Gastroenterology

## 2018-01-22 ENCOUNTER — Ambulatory Visit (HOSPITAL_COMMUNITY)
Admission: RE | Admit: 2018-01-22 | Discharge: 2018-01-22 | Disposition: A | Discharge status: Home / Self Care | Payer: Medicare Other | Source: Ambulatory Visit | Attending: Gastroenterology | Admitting: Gastroenterology

## 2018-01-22 ENCOUNTER — Telehealth: Payer: Self-pay | Admitting: Gastroenterology

## 2018-01-22 DIAGNOSIS — K529 Noninfective gastroenteritis and colitis, unspecified: Secondary | ICD-10-CM | POA: Insufficient documentation

## 2018-01-22 DIAGNOSIS — Z86010 Personal history of colonic polyps: Secondary | ICD-10-CM | POA: Insufficient documentation

## 2018-01-22 DIAGNOSIS — R198 Other specified symptoms and signs involving the digestive system and abdomen: Secondary | ICD-10-CM | POA: Insufficient documentation

## 2018-01-22 DIAGNOSIS — K760 Fatty (change of) liver, not elsewhere classified: Secondary | ICD-10-CM | POA: Diagnosis not present

## 2018-01-22 DIAGNOSIS — N281 Cyst of kidney, acquired: Secondary | ICD-10-CM | POA: Insufficient documentation

## 2018-01-22 DIAGNOSIS — R0989 Other specified symptoms and signs involving the circulatory and respiratory systems: Secondary | ICD-10-CM | POA: Diagnosis not present

## 2018-01-22 NOTE — Telephone Encounter (Signed)
Left message to return call 

## 2018-01-23 NOTE — Telephone Encounter (Signed)
Pt returned your call and would like a call back .. °

## 2018-01-23 NOTE — Telephone Encounter (Signed)
Left message to return call 

## 2018-01-23 NOTE — Telephone Encounter (Signed)
Free sample kit for pt to pick up.

## 2018-02-07 ENCOUNTER — Inpatient Hospital Stay: Payer: Medicare Other | Attending: Oncology

## 2018-02-07 DIAGNOSIS — Z5111 Encounter for antineoplastic chemotherapy: Secondary | ICD-10-CM | POA: Diagnosis not present

## 2018-02-07 DIAGNOSIS — C61 Malignant neoplasm of prostate: Secondary | ICD-10-CM | POA: Diagnosis not present

## 2018-02-07 MED ORDER — LEUPROLIDE ACETATE (3 MONTH) 22.5 MG IM KIT
PACK | INTRAMUSCULAR | Status: AC
  Filled 2018-02-07: qty 22.5

## 2018-02-07 MED ORDER — LEUPROLIDE ACETATE 7.5 MG IM KIT
7.5000 mg | PACK | Freq: Once | INTRAMUSCULAR | Status: AC
  Administered 2018-02-07: 7.5 mg via INTRAMUSCULAR
  Filled 2018-02-07: qty 7.5

## 2018-02-07 NOTE — Patient Instructions (Signed)

## 2018-02-13 ENCOUNTER — Other Ambulatory Visit: Payer: Self-pay | Admitting: Gastroenterology

## 2018-02-13 ENCOUNTER — Encounter: Payer: Self-pay | Admitting: Gastroenterology

## 2018-02-13 ENCOUNTER — Ambulatory Visit (AMBULATORY_SURGERY_CENTER): Payer: Medicare Other | Admitting: Gastroenterology

## 2018-02-13 VITALS — BP 141/76 | HR 70 | Temp 97.5°F | Resp 14 | Ht 71.0 in | Wt 185.0 lb

## 2018-02-13 DIAGNOSIS — K635 Polyp of colon: Secondary | ICD-10-CM | POA: Diagnosis not present

## 2018-02-13 DIAGNOSIS — Z860100 Personal history of colon polyps, unspecified: Secondary | ICD-10-CM

## 2018-02-13 DIAGNOSIS — D125 Benign neoplasm of sigmoid colon: Secondary | ICD-10-CM

## 2018-02-13 DIAGNOSIS — R198 Other specified symptoms and signs involving the digestive system and abdomen: Secondary | ICD-10-CM

## 2018-02-13 MED ORDER — SODIUM CHLORIDE 0.9 % IV SOLN: 500.0000 mL | Freq: Once | INTRAVENOUS | Status: DC

## 2018-02-13 MED ORDER — DICYCLOMINE HCL 10 MG PO CAPS: 10.0000 mg | ORAL_CAPSULE | ORAL | 3 refills | Status: DC

## 2018-02-13 NOTE — Patient Instructions (Signed)
Handouts given for polyps and diverticulosis  YOU HAD AN ENDOSCOPIC PROCEDURE TODAY AT [facility]:   Refer to the procedure report that was given to you for any specific questions about what was found during the examination.  If the procedure report does not answer your questions, please call your gastroenterologist to clarify.  If you requested that your care partner not be given the details of your procedure findings, then the procedure report has been included in a sealed envelope for you to review at your convenience later.  YOU SHOULD EXPECT: Some feelings of bloating in the abdomen. Passage of more gas than usual.  Walking can help get rid of the air that was put into your GI tract during the procedure and reduce the bloating. If you had a lower endoscopy (such as a colonoscopy or flexible sigmoidoscopy) you may notice spotting of blood in your stool or on the toilet paper. If you underwent a bowel prep for your procedure, you may not have a normal bowel movement for a few days.  Please Note:  You might notice some irritation and congestion in your nose or some drainage.  This is from the oxygen used during your procedure.  There is no need for concern and it should clear up in a day or so.  SYMPTOMS TO REPORT IMMEDIATELY:   Following lower endoscopy (colonoscopy or flexible sigmoidoscopy):  Excessive amounts of blood in the stool  Significant tenderness or worsening of abdominal pains  Swelling of the abdomen that is new, acute  Fever of 100F or higher  For urgent or emergent issues, a gastroenterologist can be reached at any hour by calling [phone].   DIET:  We do recommend a small meal at first, but then you may proceed to your regular diet.  Drink plenty of fluids but you should avoid alcoholic beverages for 24 hours.  ACTIVITY:  You should plan to take it easy for the rest of today and you should NOT DRIVE or use heavy machinery until tomorrow (because of  the sedation medicines used during the test).    FOLLOW UP: Our staff will call the number listed on your records the next business day following your procedure to check on you and address any questions or concerns that you may have regarding the information given to you following your procedure. If we do not reach you, we will leave a message.  However, if you are feeling well and you are not experiencing any problems, there is no need to return our call.  We will assume that you have returned to your regular daily activities without incident.  If any biopsies were taken you will be contacted by phone or by letter within the next 1-3 weeks.  Please call us at [phone] if you have not heard about the biopsies in 3 weeks.    SIGNATURES/CONFIDENTIALITY: You and/or your care partner have signed paperwork which will be entered into your electronic medical record.  These signatures attest to the fact that that the information above on your After Visit Summary has been reviewed and is understood.  Full responsibility of the confidentiality of this discharge information lies with you and/or your care-partner.

## 2018-02-13 NOTE — Progress Notes (Signed)
Pt's states no medical or surgical changes since previsit or office visit. 

## 2018-02-13 NOTE — Progress Notes (Signed)
Called to room to assist during endoscopic procedure.  Patient ID and intended procedure confirmed with present staff. Received instructions for my participation in the procedure from the performing physician.  

## 2018-02-13 NOTE — Progress Notes (Signed)
To PACU< VSS. Report to [email or web address] 

## 2018-02-13 NOTE — Op Note (Signed)
[place] Patient Name: Alexander Yang Procedure Date: 02/13/2018 3:10 PM MRN: [id number] Endoscopist: Mallie Mussel L. Loletha Carrow , MD Age: 77 Referring MD:  Date of Birth: [birth year] Gender: Male Account #: [id number] Procedure:                Colonoscopy Indications:              Surveillance: Personal history of colonic polyps                            (unknown histology) on last colonoscopy more than 5                            years ago (2012); (no polyps in 2009) Medicines:                Monitored Anesthesia Care Procedure:                Pre-Anesthesia Assessment:                           - Prior to the procedure, a History and Physical                            was performed, and patient medications and                            allergies were reviewed. The patient's tolerance of                            previous anesthesia was also reviewed. The risks                            and benefits of the procedure and the sedation                            options and risks were discussed with the patient.                            All questions were answered, and informed consent                            was obtained. Anticoagulants: The patient has taken                            aspirin. It was decided not to withhold this                            medication prior to the procedure. ASA Grade                            Assessment: II - A patient with mild systemic                            disease. After reviewing the risks and benefits,  the patient was deemed in satisfactory condition to                            undergo the procedure.                           After obtaining informed consent, the colonoscope                            was passed under direct vision. Throughout the                            procedure, the patient's blood pressure, pulse, and                            oxygen saturations were monitored continuously.  The                            Colonoscope was introduced through the anus and                            advanced to the the terminal ileum, with                            identification of the appendiceal orifice and IC                            valve. The colonoscopy was performed without                            difficulty. The patient tolerated the procedure                            well. The quality of the bowel preparation was                            excellent. The terminal ileum, ileocecal valve,                            appendiceal orifice, and rectum were photographed. Scope In: 3:15:27 PM Scope Out: 0:35:46 PM Scope Withdrawal Time: 0 hours 9 minutes 38 seconds  Total Procedure Duration: 0 hours 12 minutes 51 seconds  Findings:                 The perianal and digital rectal examinations were                            normal.                           The terminal ileum appeared normal.                           The mucosa vascular pattern in the anterior distal  rectum was locally increased, consistent with prior                            radiation therapy for prostate cancer. The                            remainder of the colon mucosa was normal, and                            random biopsies were taken from the right and left                            colon to rule out microscopic colitis.                           A 3 mm polyp was found in the sigmoid colon. The                            polyp was sessile. The polyp was removed with a                            cold biopsy forceps. Resection and retrieval were                            complete.                           Multiple diverticula were found in the left colon. Complications:            No immediate complications. Estimated Blood Loss:     Estimated blood loss was minimal. Impression:               - The examined portion of the ileum was normal.                            - Increased mucosa vascular pattern in the distal                            rectum.                           - One 3 mm polyp in the sigmoid colon, removed with                            a cold biopsy forceps. Resected and retrieved.                           - Diverticulosis in the left colon. Recommendation:           - Patient has a contact number available for                            emergencies. The signs and symptoms of potential  delayed complications were discussed with the                            patient. Return to normal activities tomorrow.                            Written discharge instructions were provided to the                            patient.                           - Resume previous diet.                           - Continue present medications.                           - Await pathology results.                           - No repeat routine surveillance colonoscopy                            necessary due to age. Henry L. Loletha Carrow, MD 02/13/2018 3:38:25 PM This report has been signed electronically.

## 2018-02-14 ENCOUNTER — Telehealth: Payer: Self-pay | Admitting: *Deleted

## 2018-02-14 NOTE — Telephone Encounter (Signed)
  Follow up Call-  Call back number 02/13/2018  Post procedure Call Back phone  # [phone]  Permission to leave phone message Yes  Some recent data might be hidden     Patient questions:  Do you have a fever, pain , or abdominal swelling? No. Pain Score  0 *  Have you tolerated food without any problems? Yes.    Have you been able to return to your normal activities? Yes.    Do you have any questions about your discharge instructions: Diet   No. Medications  No. Follow up visit  No.  Do you have questions or concerns about your Care? No.  Actions: * If pain score is 4 or above: No action needed, pain <4.

## 2018-02-20 ENCOUNTER — Encounter: Payer: Self-pay | Admitting: Gastroenterology

## 2018-03-07 ENCOUNTER — Inpatient Hospital Stay: Payer: Medicare Other | Attending: Oncology

## 2018-03-07 VITALS — BP 163/84 | HR 57 | Temp 98.3°F | Resp 16

## 2018-03-07 DIAGNOSIS — C61 Malignant neoplasm of prostate: Secondary | ICD-10-CM | POA: Diagnosis not present

## 2018-03-07 DIAGNOSIS — Z79818 Long term (current) use of other agents affecting estrogen receptors and estrogen levels: Secondary | ICD-10-CM | POA: Diagnosis not present

## 2018-03-07 MED ORDER — LEUPROLIDE ACETATE 7.5 MG IM KIT
7.5000 mg | PACK | Freq: Once | INTRAMUSCULAR | Status: AC
  Administered 2018-03-07: 7.5 mg via INTRAMUSCULAR
  Filled 2018-03-07: qty 7.5

## 2018-03-07 NOTE — Patient Instructions (Signed)

## 2018-03-28 ENCOUNTER — Ambulatory Visit: Payer: Medicare Other

## 2018-03-28 ENCOUNTER — Ambulatory Visit (INDEPENDENT_AMBULATORY_CARE_PROVIDER_SITE_OTHER): Payer: Medicare Other

## 2018-03-28 VITALS — BP 150/100 | HR 65 | Temp 97.7°F | Ht 71.0 in | Wt 190.5 lb

## 2018-03-28 DIAGNOSIS — Z0000 Encounter for general adult medical examination without abnormal findings: Secondary | ICD-10-CM | POA: Diagnosis not present

## 2018-03-28 DIAGNOSIS — I10 Essential (primary) hypertension: Secondary | ICD-10-CM

## 2018-03-28 DIAGNOSIS — E7800 Pure hypercholesterolemia, unspecified: Secondary | ICD-10-CM

## 2018-03-28 DIAGNOSIS — E0840 Diabetes mellitus due to underlying condition with diabetic neuropathy, unspecified: Secondary | ICD-10-CM

## 2018-03-28 LAB — COMPREHENSIVE METABOLIC PANEL
ALT: 12 U/L (ref 0–53)
AST: 13 U/L (ref 0–37)
Albumin: 4.2 g/dL (ref 3.5–5.2)
Alkaline Phosphatase: 37 U/L — ABNORMAL LOW (ref 39–117)
BUN: 17 mg/dL (ref 6–23)
CO2: 28 mEq/L (ref 19–32)
Calcium: 9.8 mg/dL (ref 8.4–10.5)
Chloride: 105 mEq/L (ref 96–112)
Creatinine, Ser: 0.75 mg/dL (ref 0.40–1.50)
GFR: 100.95 mL/min (ref >60.00)
Glucose, Bld: 132 mg/dL — ABNORMAL HIGH (ref 70–99)
Potassium: 4.4 mEq/L (ref 3.5–5.1)
Sodium: 141 mEq/L (ref 135–145)
Total Bilirubin: 0.6 mg/dL (ref 0.2–1.2)
Total Protein: 7.1 g/dL (ref 6.0–8.3)

## 2018-03-28 LAB — LIPID PANEL
CHOL/HDL RATIO: 3
Cholesterol: 164 mg/dL (ref 0–200)
HDL: 54.3 mg/dL (ref >39.00)
LDL Cholesterol: 92 mg/dL (ref 0–99)
NonHDL: 110.18
Triglycerides: 92 mg/dL (ref 0.0–149.0)
VLDL: 18.4 mg/dL (ref 0.0–40.0)

## 2018-03-28 LAB — HEMOGLOBIN A1C: Hgb A1c MFr Bld: 7.3 % — ABNORMAL HIGH (ref 4.6–6.5)

## 2018-03-28 NOTE — Progress Notes (Signed)
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 Patient has not taken BP medication.   Next PCP appt:   04/01/2018 @ 1000

## 2018-03-28 NOTE — Patient Instructions (Signed)
Alexander Yang , Thank you for taking time to come for your Medicare Wellness Visit. I appreciate your ongoing commitment to your health goals. Please review the following plan we discussed and let me know if I can assist you in the future.   These are the goals we discussed: Goals    . Increase physical activity     Starting 03/28/2018, I will continue to exercise for at least 60 minutes 4 days per week.        This is a list of the screening recommended for you and due dates:  Health Maintenance  Topic Date Due  . Tetanus Vaccine  03/11/2020*  . Eye exam for diabetics  08/20/2018  . Hemoglobin A1C  09/26/2018  . Complete foot exam   09/28/2018  . Flu Shot  Completed  . Pneumonia vaccines  Completed  *Topic was postponed. The date shown is not the original due date.   Preventive Care for Adults  A healthy lifestyle and preventive care can promote health and wellness. Preventive health guidelines for adults include the following key practices.  . A routine yearly physical is a good way to check with your health care provider about your health and preventive screening. It is a chance to share any concerns and updates on your health and to receive a thorough exam.  . Visit your dentist for a routine exam and preventive care every 6 months. Brush your teeth twice a day and floss once a day. Good oral hygiene prevents tooth decay and gum disease.  . The frequency of eye exams is based on your age, health, family medical history, use  of contact lenses, and other factors. Follow your health care provider's recommendations for frequency of eye exams.  . Eat a healthy diet. Foods like vegetables, fruits, whole grains, low-fat dairy products, and lean protein foods contain the nutrients you need without too many calories. Decrease your intake of foods high in solid fats, added sugars, and salt. Eat the right amount of calories for you. Get information about a proper diet from your health care  provider, if necessary.  . Regular physical exercise is one of the most important things you can do for your health. Most adults should get at least 150 minutes of moderate-intensity exercise (any activity that increases your heart rate and causes you to sweat) each week. In addition, most adults need muscle-strengthening exercises on 2 or more days a week.  Silver Sneakers may be a benefit available to you. To determine eligibility, you may visit the website: www.silversneakers.com or contact program at (770) 460-7480 Mon-Fri between 8AM-8PM.   . Maintain a healthy weight. The body mass index (BMI) is a screening tool to identify possible weight problems. It provides an estimate of body fat based on height and weight. Your health care provider can find your BMI and can help you achieve or maintain a healthy weight.   For adults 20 years and older: ? A BMI below 18.5 is considered underweight. ? A BMI of 18.5 to 24.9 is normal. ? A BMI of 25 to 29.9 is considered overweight. ? A BMI of 30 and above is considered obese.   . Maintain normal blood lipids and cholesterol levels by exercising and minimizing your intake of saturated fat. Eat a balanced diet with plenty of fruit and vegetables. Blood tests for lipids and cholesterol should begin at age 47 and be repeated every 5 years. If your lipid or cholesterol levels are high, you are over 50,  or you are at high risk for heart disease, you may need your cholesterol levels checked more frequently. Ongoing high lipid and cholesterol levels should be treated with medicines if diet and exercise are not working.  . If you smoke, find out from your health care provider how to quit. If you do not use tobacco, please do not start.  . If you choose to drink alcohol, please do not consume more than 2 drinks per day. One drink is considered to be 12 ounces (355 mL) of beer, 5 ounces (148 mL) of wine, or 1.5 ounces (44 mL) of liquor.  . If you are 29-79 years  old, ask your health care provider if you should take aspirin to prevent strokes.  . Use sunscreen. Apply sunscreen liberally and repeatedly throughout the day. You should seek shade when your shadow is shorter than you. Protect yourself by wearing long sleeves, pants, a wide-brimmed hat, and sunglasses year round, whenever you are outdoors.  . Once a month, do a whole body skin exam, using a mirror to look at the skin on your back. Tell your health care provider of new moles, moles that have irregular borders, moles that are larger than a pencil eraser, or moles that have changed in shape or color.

## 2018-03-28 NOTE — Progress Notes (Signed)
I reviewed health advisor's note, was available for consultation, and agree with documentation and plan.  

## 2018-03-28 NOTE — Progress Notes (Signed)
Subjective:   Alexander Yang is a 78 y.o. male who presents for Medicare Annual/Subsequent preventive examination.  Review of Systems:  N/A Cardiac Risk Factors include: advanced age (>70mn, >>3women);diabetes mellitus;male gender;hypertension;dyslipidemia     Objective:    Vitals: BP (!) 150/100 (BP Location: Right Arm, Patient Position: Sitting, Cuff Size: Normal) Comment: BP medication not taken  Pulse 65   Temp 97.7 F (36.5 C) (Oral)   Ht '5\' 11"'$  (1.803 m) Comment: no shoes  Wt 190 lb 8 oz (86.4 kg)   SpO2 98%   BMI 26.57 kg/m   Body mass index is 26.57 kg/m.  Advanced Directives 06/26/2017 03/26/2017 01/27/2016 10/26/2015 09/06/2015 04/26/2015 12/24/2014  Does Patient Have a Medical Advance Directive? Yes Yes Yes No Yes No Yes  Type of AParamedicof ARidgel[place]Living will H[place]Living will H[place]Living will - HLimaLiving will - [place]ite CityLiving will  Does patient want to make changes to medical advance directive? No - Patient declined - No - Patient declined - No - Patient declined - No - Patient declined  Copy of [place] Chart? - No - copy requested No - copy requested - No - copy requested - No - copy requested  Would patient like information on creating a medical advance directive? - - - Yes - EScientist, clinical (histocompatibility and immunogenetics)given - Yes - Educational materials given -    Tobacco Social History   Tobacco Use  Smoking Status Former Smoker  . Packs/day: 2.00  . Years: 10.00  . Pack years: 20.00  . Types: Cigarettes  . Last attempt to quit: 03/12/1965  . Years since quitting: 53.0  Smokeless Tobacco Never Used     Counseling given: No   Clinical Intake:  Pre-visit preparation completed: Yes  Pain : No/denies pain Pain Score: 0-No pain     Nutritional Status: BMI 25 -29 Overweight Nutritional Risks: None Diabetes: Yes CBG done?:  No Did pt. bring in CBG monitor from home?: No  How often do you need to have someone help you when you read instructions, pamphlets, or other written materials from your doctor or pharmacy?: 1 - Never What is the last grade level you completed in school?: Associate degree  Interpreter Needed?: No  Comments: pt lives with spouse Information entered by :: LPinson, LPN  Past Medical History:  Diagnosis Date  . [place]   . Diabetes mellitus without complication ([place]    controlled with diet  . Hypertension   . 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 Osteoporosis   . Prostate cancer ([facility]    Past Surgical History:  Procedure Laterality Date  . ACL Tear  03/12/1978  . PROSTATE BIOPSY    . TONSILLECTOMY  03/13/1947  . VASECTOMY     Family History  Problem Relation Age of Onset  . Cerebral aneurysm Mother   . Diabetes Father   . Bradycardia Father   . Benign prostatic hyperplasia Father   . Supraventricular tachycardia Brother   . Supraventricular tachycardia Brother    Social History   Socioeconomic History  . Marital status: Married    Spouse name: Not on file  . Number of children: 2  . Years of education: Not on file  . Highest education level: Not on file  Occupational History  . Occupation: Retired  Scientific laboratory technician  . Financial resource strain: Not on file  . Food insecurity:    Worry: Not on file    Inability: Not on file  . Transportation needs:    Medical: Not on file    Non-medical: Not on file  Tobacco Use  . Smoking status: Former Smoker    Packs/day: 2.00    Years: 10.00    Pack years: 20.00    Types: Cigarettes    Last attempt to quit: 03/12/1965    Years since quitting: 53.0  . Smokeless tobacco: Never Used  Substance and Sexual Activity  . Alcohol use: Yes    Alcohol/week:  7.0 standard drinks    Types: 3 Glasses of wine, 4 Shots of liquor per week  . Drug use: No  . Sexual activity: Never  Lifestyle  . Physical activity:    Days per week: Not on file    Minutes per session: Not on file  . Stress: Not on file  Relationships  . Social connections:    Talks on phone: Not on file    Gets together: Not on file    Attends religious service: Not on file    Active member of club or organization: Not on file    Attends meetings of clubs or organizations: Not on file    Relationship status: Not on file  Other Topics Concern  . Not on file  Social History Narrative   Exercising: 3-4 times a week.   Healthy eating habits.   Full Code   Has a living will, has HCPOA: Physiological scientist (reviewed 2015)    Outpatient Encounter Medications as of 03/28/2018  Medication Sig  . aspirin 81 MG tablet Take 1 tablet (81 mg total) by mouth daily. (Patient taking differently: Take 81 mg by mouth every other day. )  . benzonatate (TESSALON) 200 MG capsule Take 1 capsule (200 mg total) by mouth 2 (two) times daily as needed for cough.  . Blood Glucose Monitoring Suppl (ACCU-CHEK AVIVA PLUS) w/Device KIT Use to check blood sugar 2 times a day.  Dx: E11.49  . CALCIUM-VITAMIN D PO Take 1 tablet by mouth daily.  Marland Kitchen dicyclomine (BENTYL) 10 MG capsule Take 1 capsule (10 mg total) by mouth every morning.  Marland Kitchen glucose blood (ACCU-CHEK AVIVA) test strip Use to check blood sugar 2 times a day.  Dx: E11.49  . ketoconazole (NIZORAL) 2 % cream Apply 1 application topically daily as needed for irritation.  . Lancets (ACCU-CHEK SOFT TOUCH) lancets Use to check blood sugar 2 times a day.  Dx: E11.49  . Lancets Misc. (ACCU-CHEK SOFTCLIX LANCET DEV) KIT Use to check blood sugar 2 times a day.  Dx: E11.49  . leuprolide (LUPRON) 7.5 MG injection Inject 7.5 mg into the muscle every 28 (twenty-eight) days.  Marland Kitchen losartan (COZAAR) 25 MG tablet TAKE 1 TABLET BY MOUTH ONCE DAILY  . metFORMIN (GLUCOPHAGE-XR)  500 MG 24 hr tablet Take 500 mg by mouth 2 (two) times daily.   . pravastatin (PRAVACHOL) 40 MG tablet TAKE 1 TABLET BY MOUTH ONCE DAILY  . sildenafil (REVATIO) 20 MG tablet Take 2 to 5 tablets by mouth 30 minutes prior to intercourse  . [DISCONTINUED] Glucosamine 500 MG CAPS Take by mouth daily.  . [DISCONTINUED] Probiotic Product (PROBIOTIC-10 PO) Take by mouth daily.   No facility-administered encounter medications on file as of 03/28/2018.     Activities of Daily Living In your present state of  health, do you have any difficulty performing the following activities: 03/28/2018  Hearing? N  Vision? N  Difficulty concentrating or making decisions? N  Walking or climbing stairs? N  Dressing or bathing? N  Doing errands, shopping? N  Preparing Food and eating ? N  Using the Toilet? N  In the past six months, have you accidently leaked urine? N  Do you have problems with loss of bowel control? N  Managing your Medications? N  Managing your Finances? N  Housekeeping or managing your Housekeeping? N  Some recent data might be hidden    Patient Care Team: Jinny Sanders, MD as PCP - General Alen Blew Mathis Dad, MD as Consulting Physician (Oncology) Katherine Mantle, [place] as Referring Physician (Optometry)   Assessment:   This is a routine wellness examination for Jesse.   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 Increase physical activity     Starting 03/28/2018, I will continue to exercise for at least 60 minutes 4 days per week.        Fall Risk Fall Risk  03/28/2018 06/26/2017 03/26/2017 09/20/2016 09/06/2015  Falls in the past year? 0 No No Yes No  Number falls in past yr: - - - 1 -  Injury with Fall? - - - Yes -   Depression Screen PHQ 2/9 Scores 03/28/2018 06/26/2017 03/26/2017 09/20/2016  PHQ - 2 Score 0 0 0 0  PHQ- 9 Score 0 - 0 -    Cognitive Function MMSE - Mini Mental State Exam 03/28/2018 03/26/2017 09/06/2015  Orientation to time _0 Orientation to Place _1 Registration _2 Attention/ Calculation 0 0 0  Recall _3 Language- name 2 objects 0 0 0  Language- repeat _4 Language- follow 3 step command _5 Language- read & follow direction 0 0 0  Write a sentence 0 0 0  Copy design 0 0 0  Total score _6 PLEASE NOTE: A Mini-Cog screen was completed. Maximum score is 20. A value of 0 denotes this part of Folstein MMSE was not completed or the patient failed this part of the Mini-Cog screening.   Mini-Cog Screening Orientation to Time - Max 5 pts Orientation to Place - Max 5 pts Registration - Max 3 pts Recall - Max 3 pts Language Repeat - Max 1 pts Language Follow 3 Step Command - Max 3 pts     Immunization History  Administered Date(s) Administered  . Influenza Whole 01/10/2007, 12/09/2007, 12/14/2008  . Influenza, High Dose Seasonal PF 12/11/2013, 12/17/2014, 12/02/2015, 12/02/2015, 12/04/2017  . Influenza-Unspecified 12/10/2012, 12/10/2016  . Pneumococcal Conjugate-13 08/11/2013  . Pneumococcal Polysaccharide-23 03/26/2007  . Td 03/26/2007  . Zoster 12/03/2007    Screening Tests Health Maintenance  Topic Date Due  . TETANUS/TDAP  03/11/2020 (Originally 03/25/2017)  .  OPHTHALMOLOGY EXAM  08/20/2018  . HEMOGLOBIN A1C  09/26/2018  . FOOT EXAM  09/28/2018  . INFLUENZA VACCINE  Completed  . PNA vac Low Risk Adult  Completed      Plan:      I have personally reviewed, addressed, and noted the following in the patient's chart:  A. Medical and social history B. Use of alcohol, tobacco or illicit drugs  C. Current medications and supplements D. Functional ability and status E.  Nutritional status F.  Physical activity G. Advance directives H. List of other physicians I.  Hospitalizations, surgeries, and ER visits in previous 12 months J.  [place] to include hearing, vision, cognitive, depression L. Referrals and appointments - none  In addition, I have reviewed and discussed with patient certain preventive protocols, quality metrics, and best practice recommendations. A written personalized care plan for preventive services as well as general preventive health recommendations were provided to patient.  See attached scanned questionnaire for additional information.   Signed,   Lindell Noe, MHA, BS, LPN Health Coach

## 2018-04-01 ENCOUNTER — Ambulatory Visit (INDEPENDENT_AMBULATORY_CARE_PROVIDER_SITE_OTHER): Payer: Medicare Other | Admitting: Family Medicine

## 2018-04-01 ENCOUNTER — Encounter: Payer: Self-pay | Admitting: Family Medicine

## 2018-04-01 VITALS — BP 174/86 | HR 58 | Temp 97.5°F | Ht 71.0 in | Wt 188.8 lb

## 2018-04-01 DIAGNOSIS — E7800 Pure hypercholesterolemia, unspecified: Secondary | ICD-10-CM

## 2018-04-01 DIAGNOSIS — E1142 Type 2 diabetes mellitus with diabetic polyneuropathy: Secondary | ICD-10-CM

## 2018-04-01 DIAGNOSIS — C61 Malignant neoplasm of prostate: Secondary | ICD-10-CM

## 2018-04-01 DIAGNOSIS — E0840 Diabetes mellitus due to underlying condition with diabetic neuropathy, unspecified: Secondary | ICD-10-CM | POA: Diagnosis not present

## 2018-04-01 DIAGNOSIS — I10 Essential (primary) hypertension: Secondary | ICD-10-CM

## 2018-04-01 LAB — HM DIABETES FOOT EXAM

## 2018-04-01 NOTE — Patient Instructions (Signed)
Increase losartan to 50 mg daily.  Follow BP at home on arm cuff. Goal < 140/90.

## 2018-04-01 NOTE — Progress Notes (Signed)
Subjective:    Patient ID: Alexander Yang, male    DOB: 09-03-1940, 78 y.o.   MRN: [id number]  HPI   The patient presents for  complete physical and review of chronic health problems. He/She also has the following acute concerns today: NONE  The patient saw Candis Musa, LPN for medicare wellness. Note reviewed in detail and important notes copied below.  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 Patient has not taken BP medication.     04/01/18  Hypertension: inadequate control on losartan 25 .. took med today. BP Readings from Last 3 Encounters:  04/01/18 (!) 174/86  03/28/18 (!) 150/100  03/07/18 (!) 163/84  Using medication without problems or lightheadedness: none  Chest pain with exertion: none Edema:none Short of breath:none Average home BPs: 140/78wrist cuff Other issues: Wt Readings from Last 3 Encounters:  04/01/18 188 lb 12 oz (85.6 kg)  03/28/18 190 lb 8 oz (86.4 kg)  02/13/18 185 lb (83.9 kg)  Body mass index is 26.33 kg/m.   Elevated Cholesterol:  LDL < 100on pravastatin daily. Lab Results  Component Value Date   CHOL 164 03/28/2018   HDL 54.30 03/28/2018   LDLCALC 92 03/28/2018   TRIG 92.0 03/28/2018   CHOLHDL 3 03/28/2018  Using medications without problems: Muscle aches:  Diet compliance: good, low carb Exercise: 3-4 days a week. Other complaints:  Diabetes:  On metformin XR 1000 mg daily Lab Results  Component Value Date   HGBA1C 7.3 (H) 03/28/2018  Using medications without difficulties: none Hypoglycemic episodes:none Hyperglycemic episodes:none Feet problems: has feeling of pad on  base on left foot. Blood Sugars averaging: FBS 140-145 eye exam within last year:08/2017  Prostate cancer followed by [facility] on Lupron  PSA repeat next week   Social History /Family History/Past Medical History reviewed in detail and updated in EMR if needed. Blood pressure (!) 174/86, pulse (!) 58, temperature (!) 97.5 F (36.4 C), temperature source Oral, height 5\' 11"  (1.803 m), weight 188 lb 12 oz (85.6 kg), SpO2 98 %.   Review of Systems  Constitutional: Negative for fatigue and fever.  HENT: Negative for ear pain.   Eyes: Negative for pain.  Respiratory: Negative for cough and shortness of breath.   Cardiovascular: Negative for chest pain, palpitations and leg swelling.  Gastrointestinal: Negative for abdominal pain.  Genitourinary: Negative for dysuria.  Musculoskeletal: Negative for arthralgias.  Neurological: Negative for syncope, light-headedness and headaches.  Psychiatric/Behavioral: Negative for dysphoric mood.       Objective:   Physical Exam Constitutional:      General: He is not in acute distress.    Appearance: Normal appearance. He is well-developed. He is not ill-appearing or toxic-appearing.  HENT:     Head: Normocephalic and atraumatic.     Right Ear: Hearing, tympanic membrane, ear canal and external ear normal.     Left Ear: Hearing, tympanic membrane, ear canal and external ear normal.     Nose: Nose normal.     Mouth/Throat:  Pharynx: Uvula midline.  Eyes:     General: Lids are normal. Lids are everted, no foreign bodies appreciated.     Conjunctiva/sclera: Conjunctivae normal.     Pupils: Pupils are equal, round, and reactive to light.  Neck:     Musculoskeletal: Normal range of motion and neck supple.     Thyroid: No thyroid mass or thyromegaly.     Vascular: No carotid bruit.     Trachea: Trachea and phonation normal.  Cardiovascular:     Rate and Rhythm: Normal rate and regular rhythm.     Pulses: Normal pulses.     Heart sounds: S1  normal and S2 normal. No murmur. No gallop.   Pulmonary:     Breath sounds: Normal breath sounds. No wheezing, rhonchi or rales.  Abdominal:     General: Bowel sounds are normal.     Palpations: Abdomen is soft.     Tenderness: There is no abdominal tenderness. There is no guarding or rebound.     Hernia: No hernia is present.  Lymphadenopathy:     Cervical: No cervical adenopathy.  Skin:    General: Skin is warm and dry.     Findings: No rash.  Neurological:     Mental Status: He is alert.     Cranial Nerves: No cranial nerve deficit.     Sensory: No sensory deficit.     Gait: Gait normal.     Deep Tendon Reflexes: Reflexes are normal and symmetric.  Psychiatric:        Speech: Speech normal.        Behavior: Behavior normal.        Judgment: Judgment normal.     Diabetic foot exam: Normal inspection No skin breakdown No calluses  Normal DP pulses Normal sensation to light touch and monofilament Nails normal       Assessment & Plan:  The patient's preventative maintenance and recommended screening tests for an annual wellness exam were reviewed in full today. Brought up to date unless services declined.  Counselled on the importance of diet, exercise, and its role in overall health and mortality. The patient's FH and SH was reviewed, including their home life, tobacco status, and drug and alcohol status.   Vaccines: uptodate. Colon:  2019 negative, no further indicated   Prostate cancer: on lupron

## 2018-04-01 NOTE — Assessment & Plan Note (Signed)
Worsened control on metformin..  Diet and exercise .. if not improving will need to increase metformin.  MAy be up due to lupron in part.

## 2018-04-01 NOTE — Assessment & Plan Note (Signed)
Inadequate control.. increase losartan to 50 mg daily. Follow BP at home.

## 2018-04-01 NOTE — Assessment & Plan Note (Signed)
Well controlled. Continue current medication.  

## 2018-04-01 NOTE — Assessment & Plan Note (Signed)
Tolerable on no medication.

## 2018-04-03 DIAGNOSIS — C61 Malignant neoplasm of prostate: Secondary | ICD-10-CM | POA: Diagnosis not present

## 2018-04-10 ENCOUNTER — Telehealth: Payer: Self-pay | Admitting: *Deleted

## 2018-04-10 ENCOUNTER — Encounter: Payer: Self-pay | Admitting: Oncology

## 2018-04-10 ENCOUNTER — Encounter: Payer: Self-pay | Admitting: *Deleted

## 2018-04-10 NOTE — Telephone Encounter (Signed)
Patient sent my chart message, asking if we have rec'd his labs from [facility]?  We have not and I gave him my fax # so they may fax them to me today, to be reviewed during his appt tomorrow, with dr Alen Blew

## 2018-04-11 ENCOUNTER — Inpatient Hospital Stay: Payer: Medicare Other | Attending: Oncology | Admitting: Oncology

## 2018-04-11 ENCOUNTER — Telehealth: Payer: Self-pay

## 2018-04-11 ENCOUNTER — Inpatient Hospital Stay: Payer: Medicare Other

## 2018-04-11 VITALS — BP 181/72 | HR 68 | Temp 98.7°F | Resp 17 | Ht 71.0 in | Wt 189.0 lb

## 2018-04-11 DIAGNOSIS — E291 Testicular hypofunction: Secondary | ICD-10-CM

## 2018-04-11 DIAGNOSIS — R232 Flushing: Secondary | ICD-10-CM | POA: Diagnosis not present

## 2018-04-11 DIAGNOSIS — Z5111 Encounter for antineoplastic chemotherapy: Secondary | ICD-10-CM | POA: Diagnosis not present

## 2018-04-11 DIAGNOSIS — R197 Diarrhea, unspecified: Secondary | ICD-10-CM | POA: Diagnosis not present

## 2018-04-11 DIAGNOSIS — C61 Malignant neoplasm of prostate: Secondary | ICD-10-CM | POA: Insufficient documentation

## 2018-04-11 MED ORDER — LEUPROLIDE ACETATE 7.5 MG IM KIT
7.5000 mg | PACK | Freq: Once | INTRAMUSCULAR | Status: AC
  Administered 2018-04-11: 7.5 mg via INTRAMUSCULAR
  Filled 2018-04-11: qty 7.5

## 2018-04-11 NOTE — Patient Instructions (Signed)

## 2018-04-11 NOTE — Progress Notes (Signed)
Hematology and Oncology Follow Up Visit  Alexander Yang [id number] 1941-02-08 78 y.o. 04/11/2018 3:01 PM [name], [name], [name], [name], MD   Principle Diagnosis: 78 year old man with prostate cancer diagnosed in 2009 subsequently developed castration-sensitive ith biochemical relapse.  His Gleason score was 6 with a PSA of 3.17   Prior Therapy: Observation and surveillance between 2009 in 2011.  He was found to have a Gleason score 4+4 = 8 in 2011. He received radiation therapy between October 2011 until December 2011.   He received concomitant Taxotere chemotherapy as a part of a clinical trial. He also received 6 months of hormone therapy up until July 2012. His PSA nadir was down to 0.1 In December 2013.   His PSA continue to be low close to 0.1 and 0.2 Up till December 2015.   In June 2016 was up to 0.4 and his most recent PSA in September 2016 was up to 0.7.    Current therapy:   Androgen deprivation therapy with Lupron at 7.5 mg on a monthly basis started in March 2018.  Last injection will be given on January 08, 2017 and has been on hold since that time.   Lupron 7.5 mg monthly resumed on October 10, 2017.   Interim History:  Alexander Yang is here for a follow-up visit.  Since last visit, he reports no major changes in his health.  He continues to tolerate Lupron without any new complications.  He does report some hot flashes but no excessive fatigue or tiredness.  He denies any recent hospitalization or illnesses.  He denies any bone pain or discomfort.  Performance status and quality of life remains unchanged.  Patient denied any alteration mental status, neuropathy, confusion or dizziness.  Denies any headaches or lethargy.  Denies any night sweats, weight loss or changes in appetite.  Denied orthopnea, dyspnea on exertion or chest discomfort.  Denies shortness of breath, difficulty breathing hemoptysis or cough.  Denies any abdominal distention, nausea, early satiety or  dyspepsia.  Denies any hematuria, frequency, dysuria or nocturia.  Denies any skin irritation, dryness or rash.  Denies any ecchymosis or petechiae.  Denies any lymphadenopathy or clotting.  Denies any heat or cold intolerance.  Denies any anxiety or depression.  Remaining review of system is negative.    Medications: I have reviewed the patient's current medications.  Current Outpatient Medications  Medication Sig Dispense Refill  . aspirin 81 MG tablet Take 81 mg by mouth every other day.    . benzonatate (TESSALON) 200 MG capsule Take 1 capsule (200 mg total) by mouth 2 (two) times daily as needed for cough. 20 capsule 0  . Blood Glucose Monitoring Suppl (ACCU-CHEK AVIVA PLUS) w/Device KIT Use to check blood sugar 2 times a day.  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 CALCIUM-VITAMIN D PO Take 1 tablet by mouth daily.    Marland Kitchen dicyclomine (BENTYL) 10 MG capsule Take 1 capsule (10 mg total) by mouth every morning. 60 capsule 3  . glucose blood (ACCU-CHEK AVIVA) test strip Use to check blood sugar 2 times a day.  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 ketoconazole (NIZORAL) 2 % cream Apply 1 application topically daily as needed for irritation.    . Lancets (ACCU-CHEK SOFT TOUCH) lancets Use to check blood sugar 2 times a day.  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 Lancets Misc. (ACCU-CHEK SOFTCLIX LANCET DEV) KIT Use to check blood sugar 2 times a day.  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  leuprolide (LUPRON) 7.5 MG injection Inject 7.5 mg into the muscle every 28 (twenty-eight) days. 1 each 0  . losartan (COZAAR) 50 MG tablet Take 50 mg by mouth daily.    . metFORMIN (GLUCOPHAGE-XR) 500 MG 24 hr tablet Take 500 mg by mouth 2 (two) times daily.     . pravastatin (PRAVACHOL) 40 MG tablet TAKE 1 TABLET BY MOUTH ONCE DAILY 90 tablet 0  . sildenafil (REVATIO) 20 MG tablet Take 2 to 5 tablets by mouth 30 minutes prior to intercourse 50 tablet 3   No current facility-administered medications for this visit.      Allergies: No Known Allergies  Past  Medical History, Surgical history, Social history, and Family History reviewed and did not change.    Physical exam:  Blood pressure (!) 181/72, pulse 68, temperature 98.7 F (37.1 C), temperature source Oral, resp. rate 17, height 5' 11"  (1.803 m), weight 189 lb (85.7 kg), SpO2 100 %.   ECOG 0.    General appearance: Alert, awake without any distress. Head: Atraumatic without abnormalities Oropharynx: Without any thrush or ulcers. Eyes: No scleral icterus. Lymph nodes: No lymphadenopathy noted in the cervical, supraclavicular, or axillary nodes Heart:regular rate and rhythm, without any murmurs or gallops.   Lung: Clear to auscultation without any rhonchi, wheezes or dullness to percussion. Abdomin: Soft, nontender without any shifting dullness or ascites. Musculoskeletal: No clubbing or cyanosis. Neurological: No motor or sensory deficits. Skin: No rashes or lesions.    PSA on 04/20/2015 was 2.6.  PSA on 07/19/2015 was 3.7. PSA on 10/18/2015 was 3.2. PSA on 01/11/2016 was 4.4. PSA on 04/12/2016 was 8.2. Lupron started in March 2018. PSA on 08/23/2016 was 0.7 PSA in August 2018 was 0.7. PSA in 12/2016 0.7.  Testosterone was less than 3. PSA on April 03, 2017 was 1.5. PSA on July 02, 2017 was 5.7. PSA on 10/02/2017 was 13.7 PSA 01/02/3018 3.9 PSA on April 03, 2018 was 3.4.   Impression and Plan:   78 year old man with  1.  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  He continues to be on Lupron on a monthly basis without any major complications.  His PSA continues to decline currently at 3.4.  Long-term complication associated with androgen deprivation was reiterated.  These would include hot flashes, weight gain and sexual dysfunction.  Alternative treatment options would include adding Zytiga, Xtandi among other hormonal options.  He is agreeable to continue with Lupron alone.  2.  Chronic diarrhea: Improved  slightly at this time.  3. Follow-up: Monthly for Lupron and MD follow-up in 3 months.  15  minutes was spent with the patient face-to-face today.  More than 50% of time was dedicated to discussing the natural course of his disease, complications related to therapy and alternative options.Zola Button, MD 1/31/20203:01 PM

## 2018-04-11 NOTE — Telephone Encounter (Signed)
Printed avs and calender of upcoming appointment per 1/31 los

## 2018-04-14 ENCOUNTER — Other Ambulatory Visit: Payer: Self-pay | Admitting: Family Medicine

## 2018-04-15 MED ORDER — LOSARTAN POTASSIUM 100 MG PO TABS: 100.0000 mg | ORAL_TABLET | Freq: Every day | ORAL | 11 refills | Status: DC

## 2018-05-09 ENCOUNTER — Inpatient Hospital Stay: Payer: Medicare Other | Attending: Oncology

## 2018-05-09 DIAGNOSIS — E291 Testicular hypofunction: Secondary | ICD-10-CM | POA: Insufficient documentation

## 2018-05-09 DIAGNOSIS — C61 Malignant neoplasm of prostate: Secondary | ICD-10-CM | POA: Insufficient documentation

## 2018-05-09 MED ORDER — LEUPROLIDE ACETATE 7.5 MG IM KIT
7.5000 mg | PACK | Freq: Once | INTRAMUSCULAR | Status: AC
  Administered 2018-05-09: 7.5 mg via INTRAMUSCULAR
  Filled 2018-05-09: qty 7.5

## 2018-05-09 NOTE — Patient Instructions (Signed)

## 2018-06-02 ENCOUNTER — Encounter: Payer: Self-pay | Admitting: Oncology

## 2018-06-06 ENCOUNTER — Inpatient Hospital Stay: Payer: Medicare Other | Attending: Oncology

## 2018-06-19 DIAGNOSIS — E0840 Diabetes mellitus due to underlying condition with diabetic neuropathy, unspecified: Secondary | ICD-10-CM

## 2018-06-20 ENCOUNTER — Other Ambulatory Visit: Payer: Self-pay | Admitting: Family Medicine

## 2018-07-01 ENCOUNTER — Other Ambulatory Visit (INDEPENDENT_AMBULATORY_CARE_PROVIDER_SITE_OTHER): Payer: Medicare Other

## 2018-07-01 ENCOUNTER — Encounter: Payer: Self-pay | Admitting: Family Medicine

## 2018-07-01 ENCOUNTER — Other Ambulatory Visit: Payer: Self-pay

## 2018-07-01 ENCOUNTER — Ambulatory Visit (INDEPENDENT_AMBULATORY_CARE_PROVIDER_SITE_OTHER): Payer: Medicare Other | Admitting: Family Medicine

## 2018-07-01 VITALS — BP 167/77 | HR 65 | Ht 71.0 in | Wt 188.0 lb

## 2018-07-01 DIAGNOSIS — I10 Essential (primary) hypertension: Secondary | ICD-10-CM

## 2018-07-01 DIAGNOSIS — I700 Atherosclerosis of aorta: Secondary | ICD-10-CM

## 2018-07-01 DIAGNOSIS — E0840 Diabetes mellitus due to underlying condition with diabetic neuropathy, unspecified: Secondary | ICD-10-CM

## 2018-07-01 DIAGNOSIS — E1142 Type 2 diabetes mellitus with diabetic polyneuropathy: Secondary | ICD-10-CM

## 2018-07-01 LAB — POCT GLYCOSYLATED HEMOGLOBIN (HGB A1C): Hemoglobin A1C: 8.1 % — AB (ref 4.0–5.6)

## 2018-07-01 MED ORDER — MISC. DEVICES MISC: 0 refills | Status: DC

## 2018-07-01 MED ORDER — DULAGLUTIDE 0.75 MG/0.5ML ~~LOC~~ SOAJ: 0.7500 mg | SUBCUTANEOUS | 11 refills | Status: DC

## 2018-07-01 NOTE — Progress Notes (Signed)
VIRTUAL VISIT Due to national recommendations of social distancing due to [place] 19, a virtual visit is felt to be most appropriate for this patient at this time.   I connected with the patient on 07/01/18 at  9:20 AM EDT by virtual telehealth platform and verified that I am speaking with the correct person using two identifiers.   I discussed the limitations, risks, security and privacy concerns of performing an evaluation and management service by  virtual telehealth platform and the availability of in person appointments. I also discussed with the patient that there may be a patient responsible charge related to this service. The patient expressed understanding and agreed to proceed.  Patient location: Home Provider Location: [facility] Participants: Eliezer Lofts and [facility]   Chief Complaint  Patient presents with  . Diabetes    History of Present Illness: 78 year old male presents for follow up of DM and HTN  Diabetes:   On metformin 1000 mg daily XR...  Low carb diet. Lab Results  Component Value Date   HGBA1C 8.1 (A) 07/01/2018  Using medications without difficulties: Hypoglycemic episodes:none Hyperglycemic episodes:none Feet problems: neuropathy, stable Blood Sugars averaging: FBS 145-175 eye exam within last year: 08/2017  Hypertension:   Inadequate control on losartan 100 mg daily BP Readings from Last 3 Encounters:  07/01/18 (!) 167/77  04/11/18 (!) 181/72  04/01/18 (!) 174/86  Using medication without problems or lightheadedness: none Chest pain with exertion:none Edema:none Short of breath:none Average home BPs: 140-145/75-80.Marland Kitchen this AM 166/77... this was unusual for jhim Other issues:  Diet compliance: good Exercise:decreased given gym closed Cholesterol.. needs recheck on pravastatin at next lab.Marland Kitchen goal LDL < 70  GFR 03/2018: 100  COVID 19 screen No recent travel or known exposure to [place] The patient denies respiratory symptoms of  COVID 19 at this time.  The importance of social distancing was discussed today.   Review of Systems  Constitutional: Negative for chills and fever.  HENT: Negative for congestion and ear pain.   Eyes: Negative for pain and redness.  Respiratory: Negative for cough and shortness of breath.   Cardiovascular: Negative for chest pain, palpitations and leg swelling.  Gastrointestinal: Negative for abdominal pain, blood in stool, constipation, diarrhea, nausea and vomiting.  Genitourinary: Negative for dysuria.  Musculoskeletal: Negative for falls and myalgias.  Skin: Negative for rash.  Neurological: Negative for dizziness.  Psychiatric/Behavioral: Negative for depression. The patient is not nervous/anxious.       Past Medical History:  Diagnosis Date  . Cerebrovascular accident ([place])   . Diabetes mellitus without complication ([place])    controlled with diet  . Hypertension   . [place] (NI[facility]) Stroke Scale dysarthria score 1, mild to moderate dysarthria, patient slurs at least some words and, at worst, can be understood with some difficulty 1980  . Osteoporosis   . Prostate cancer [facility])     reports that he quit smoking about 53 years ago. His smoking use included cigarettes. He has a 20.00 pack-year smoking history. He has never used smokeless tobacco. He reports current alcohol use of about 7.0 standard drinks of alcohol per week. He reports that he does not use drugs.   Current Outpatient Medications:  .  aspirin 81 MG tablet, Take 81 mg by mouth every other day., Disp: , Rfl:  .  Blood Glucose Monitoring Suppl (ACCU-CHEK AVIVA PLUS) w/Device KIT, Use to check blood sugar 2 times a day.  Dx: E11.49, Disp: 1 kit, Rfl:  0 .  CALCIUM-VITAMIN D PO, Take 1 tablet by mouth daily., Disp: , Rfl:  .  dicyclomine (BENTYL) 10 MG capsule, Take 1 capsule (10 mg total) by mouth every morning., Disp: 60 capsule, Rfl: 3 .  glucose blood (ACCU-CHEK AVIVA) test strip, Use to check blood  sugar 2 times a day.  Dx: E11.49, Disp: 100 each, Rfl: 11 .  ketoconazole (NIZORAL) 2 % cream, Apply 1 application topically daily as needed for irritation., Disp: , Rfl:  .  Lancets (ACCU-CHEK SOFT TOUCH) lancets, Use to check blood sugar 2 times a day.  Dx: E11.49, Disp: 100 each, Rfl: 11 .  Lancets Misc. (ACCU-CHEK SOFTCLIX LANCET DEV) KIT, Use to check blood sugar 2 times a day.  Dx: E11.49, Disp: 1 kit, Rfl: 0 .  losartan (COZAAR) 100 MG tablet, Take 1 tablet (100 mg total) by mouth daily., Disp: 30 tablet, Rfl: 11 .  metFORMIN (GLUCOPHAGE-XR) 500 MG 24 hr tablet, Take 2 tablets (1,000 mg total) by mouth daily with breakfast., Disp: 180 tablet, Rfl: 0 .  pravastatin (PRAVACHOL) 40 MG tablet, TAKE 1 TABLET BY MOUTH ONCE DAILY, Disp: 90 tablet, Rfl: 3 .  leuprolide (LUPRON) 7.5 MG injection, Inject 7.5 mg into the muscle every 28 (twenty-eight) days. (Patient not taking: Reported on 07/01/2018), Disp: 1 each, Rfl: 0   Observations/Objective: Blood pressure (!) 167/77, pulse 65, height 5' 11"  (1.803 m), weight 188 lb (85.3 kg).  Physical Exam  Physical Exam Constitutional:      General: She is not in acute distress. Pulmonary:     Effort: Pulmonary effort is normal. No respiratory distress.  Neurological:     Mental Status: She is alert and oriented to person, place, and time.  Psychiatric:        Mood and Affect: Mood normal.        Behavior: Behavior normal.   Assessment and Plan Aortic atherosclerosis ([place]) Needs aggressive risk factor management given recent CVA  Diabetes mellitus with neurological manifestations, controlled ([place]) Poor control. GFR 100.  Add Trulicity to metformin for DM control as well as CVD benefits. SE on higher dose metformin and not great choice to use high dose at 78 yr old.   HTN (hypertension) Borderline control at home... on max losartan. If not improved with weight loss.Marland Kitchen add med like amlodipine.  Diabetic peripheral neuropathy associated with type  2 diabetes mellitus (HCC) Stable.     I discussed the assessment and treatment plan with the patient. The patient was provided an opportunity to ask questions and all were answered. The patient agreed with the plan and demonstrated an understanding of the instructions.   The patient was advised to call back or seek an in-person evaluation if the symptoms worsen or if the condition fails to improve as anticipated.     Eliezer Lofts, MD

## 2018-07-01 NOTE — Assessment & Plan Note (Signed)
Needs aggressive risk factor management given recent CVA

## 2018-07-01 NOTE — Patient Instructions (Signed)
Add trulicity weekly to your regimen.  Follow BPs and blood sugars daily.  We will call you to set up 2 week virtual OV.

## 2018-07-01 NOTE — Assessment & Plan Note (Signed)
Stable

## 2018-07-01 NOTE — Assessment & Plan Note (Signed)
Borderline control at home... on max losartan. If not improved with weight loss.Marland Kitchen add med like amlodipine.

## 2018-07-01 NOTE — Assessment & Plan Note (Signed)
Poor control. GFR 100.  Add Trulicity to metformin for DM control as well as CVD benefits. SE on higher dose metformin and not great choice to use high dose at 78 yr old.

## 2018-07-02 DIAGNOSIS — C61 Malignant neoplasm of prostate: Secondary | ICD-10-CM | POA: Diagnosis not present

## 2018-07-02 NOTE — Progress Notes (Signed)
5/8 appointment pt aware

## 2018-07-04 ENCOUNTER — Telehealth: Payer: Self-pay

## 2018-07-04 ENCOUNTER — Other Ambulatory Visit: Payer: Self-pay | Admitting: Family Medicine

## 2018-07-04 MED ORDER — ONDANSETRON HCL 4 MG PO TABS: 4.0000 mg | ORAL_TABLET | Freq: Three times a day (TID) | ORAL | 0 refills | Status: DC | PRN

## 2018-07-04 NOTE — Telephone Encounter (Signed)
Stop trulicity as may be causing issues.Marland Kitchen if not improving quickly  In next 24 hours and tolerating liquids... Pt will need ER evaluation. I will send in zofran for nausea.

## 2018-07-04 NOTE — Telephone Encounter (Signed)
Patient's spouse contacted triage line concerning recent change in patient's health. Vomiting, diarrhea, and episode of syncope. Patient does not want to go to ED for evaluation. Please contact spouse Margaretmary Lombard to advise what she should do about patient.

## 2018-07-04 NOTE — Telephone Encounter (Signed)
Alexander Yang notified as instructed by telephone.

## 2018-07-08 ENCOUNTER — Encounter: Payer: Self-pay | Admitting: Oncology

## 2018-07-11 ENCOUNTER — Other Ambulatory Visit: Payer: Self-pay

## 2018-07-11 ENCOUNTER — Inpatient Hospital Stay: Payer: Medicare Other | Attending: Oncology

## 2018-07-11 ENCOUNTER — Inpatient Hospital Stay (HOSPITAL_BASED_OUTPATIENT_CLINIC_OR_DEPARTMENT_OTHER): Payer: Medicare Other | Admitting: Oncology

## 2018-07-11 VITALS — BP 140/82 | HR 80 | Temp 98.0°F | Resp 18 | Ht 71.0 in | Wt 187.9 lb

## 2018-07-11 DIAGNOSIS — Z5111 Encounter for antineoplastic chemotherapy: Secondary | ICD-10-CM | POA: Diagnosis not present

## 2018-07-11 DIAGNOSIS — C61 Malignant neoplasm of prostate: Secondary | ICD-10-CM | POA: Insufficient documentation

## 2018-07-11 MED ORDER — LEUPROLIDE ACETATE 7.5 MG IM KIT
7.5000 mg | PACK | Freq: Once | INTRAMUSCULAR | Status: AC
  Administered 2018-07-11: 7.5 mg via INTRAMUSCULAR
  Filled 2018-07-11: qty 7.5

## 2018-07-11 NOTE — Patient Instructions (Signed)

## 2018-07-11 NOTE — Progress Notes (Signed)
Hematology and Oncology Follow Up Visit  Alexander Yang [id number] 27-Jul-1940 78 y.o. 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  He was initially diagnosed in 2009 with Gleason score was 6 with a PSA of 3.17   Prior Therapy: Observation and surveillance between 2009 in 2011.  He was found to have a Gleason score 4+4 = 8 in 2011. He received radiation therapy between October 2011 until December 2011.   He received concomitant Taxotere chemotherapy as a part of a clinical trial. He also received 6 months of hormone therapy up until July 2012. His PSA nadir was down to 0.1 In December 2013.   His PSA continue to be low close to 0.1 and 0.2 Up till December 2015.   In June 2016 was up to 0.4 and his most recent PSA in September 2016 was up to 0.7.    Current therapy:   Androgen deprivation therapy with Lupron at 7.5 mg on a monthly basis started in March 2018.  Last injection will be given on January 08, 2017 and has been on hold since that time.   Lupron 7.5 mg monthly resumed on October 10, 2017.   Interim History:  [name] is here for a repeat evaluation.  Since the last visit, he reports no major changes in his health.  He continues to tolerate Lupron without any major complications.  He denies any nausea, fatigue or weight gain.  He does report some diarrhea related to his diabetes medication which is currently on hold.  His quality of life and performance status remain excellent.  He denied headaches, blurry vision, syncope or seizures.  Denies any fevers, chills or sweats.  Denied chest pain, palpitation, orthopnea or leg edema.  Denied cough, wheezing or hemoptysis.  Denied nausea, vomiting or abdominal pain.  Denies any constipation or early satiety.  Denies any frequency urgency or hesitancy.  Denies any arthralgias or myalgias.  Denies  any skin rashes or lesions.  Denies any bleeding or clotting tendency.  Denies any easy bruising.  Denies any hair or nail changes.  Denies any anxiety or depression.  Remaining review of system is negative.       Medications: I have reviewed the patient's current medications.  Current Outpatient Medications  Medication Sig Dispense Refill  . aspirin 81 MG tablet Take 81 mg by mouth every other day.    . Blood Glucose Monitoring Suppl (ACCU-CHEK AVIVA PLUS) w/Device KIT Use to check blood sugar 2 times a day.  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 CALCIUM-VITAMIN D PO Take 1 tablet by mouth daily.    Marland Kitchen dicyclomine (BENTYL) 10 MG capsule Take 1 capsule (10 mg total) by mouth every morning. 60 capsule 3  . Dulaglutide (TRULICITY) 0.08 QP/6.1PJ SOPN Inject 0.75 mg into the skin once a week. 2 mL 11  . glucose blood (ACCU-CHEK AVIVA) test strip Use to check blood sugar 2 times a day.  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 ketoconazole (NIZORAL) 2 % cream Apply 1 application topically daily as needed for irritation.    . Lancets (ACCU-CHEK SOFT TOUCH) lancets Use to check blood sugar 2 times a day.  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 Lancets Misc. (ACCU-CHEK SOFTCLIX LANCET DEV) KIT Use to check blood sugar 2 times a day.  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 leuprolide (LUPRON) 7.5 MG injection Inject 7.5 mg  into the muscle every 28 (twenty-eight) days. (Patient not taking: Reported on 07/01/2018) 1 each 0  . losartan (COZAAR) 100 MG tablet Take 1 tablet (100 mg total) by mouth daily. 30 tablet 11  . metFORMIN (GLUCOPHAGE-XR) 500 MG 24 hr tablet Take 2 tablets (1,000 mg total) by mouth daily with breakfast. 180 tablet 0  . Misc. Devices MISC Trulicity pen needles.  Weekly injection. 30 each 0  . ondansetron (ZOFRAN) 4 MG tablet Take 1-2 tablets (4-8 mg total) by mouth every 8 (eight) hours as needed for nausea or vomiting. 20 tablet 0  . pravastatin (PRAVACHOL) 40 MG tablet TAKE 1 TABLET BY MOUTH ONCE DAILY 90 tablet 3   No current  facility-administered medications for this visit.      Allergies: No Known Allergies  Past Medical History, Surgical history, Social history, and Family History reviewed and did not change.    Physical exam:  Blood pressure 140/82, pulse 80, temperature 98 F (36.7 C), temperature source Oral, resp. rate 18, height _0  (1.803 m), weight 187 lb 14.4 oz (85.2 kg), SpO2 100 %.   ECOG 0.    General appearance: Comfortable appearing without any discomfort Head: Normocephalic without any trauma Oropharynx: Mucous membranes are moist and pink without any thrush or ulcers. Eyes: Pupils are equal and round reactive to light. Lymph nodes: No cervical, supraclavicular, inguinal or axillary lymphadenopathy.   Heart:regular rate and rhythm.  S1 and S2 without leg edema. Lung: Clear without any rhonchi or wheezes.  No dullness to percussion. Abdomin: Soft, nontender, nondistended with good bowel sounds.  No hepatosplenomegaly. Musculoskeletal: No joint deformity or effusion.  Full range of motion noted. Neurological: No deficits noted on motor, sensory and deep tendon reflex exam. Skin: No petechial rash or dryness.  Appeared moist.  ensory deficits. Skin: No rashes or lesions.    PSA on 04/20/2015 was 2.6.  PSA on 07/19/2015 was 3.7. PSA on 10/18/2015 was 3.2. PSA on 01/11/2016 was 4.4. PSA on 04/12/2016 was 8.2. Lupron started in March 2018. PSA on 08/23/2016 was 0.7 PSA in August 2018 was 0.7. PSA in 12/2016 0.7.  Testosterone was less than 3. PSA on April 03, 2017 was 1.5. PSA on July 02, 2017 was 5.7. PSA on 10/02/2017 was 13.7 PSA 01/02/3018 3.9 PSA on April 03, 2018 was 3.4. PSA on April 22 of 2020 was 3.0.   Impression and Plan:   78 year old man with  1.  Prostate cancer diagnosed in 2009.  He developed castration-sensitive disease with biochemical relapse only.  He is currently on Lupron on a monthly basis without any major complications.  His last injection  was on May 09, 2018 and missed the last month injection.  Risks and benefits of resuming androgen deprivation was discussed today.  Complications include weight gain, hot flashes among others.  He is agreeable to continue with monthly Lupron.  His PSA continues to show reasonable response currently at 3.0 April 2020.  2.  Chronic diarrhea: Worsened as of late related to his diabetes medication.  Medication has been withheld with improvement in his diarrhea.  3. Follow-up: Monthly for Lupron and MD follow-up in 3 months.  15  minutes was spent with the patient face-to-face today.  More than 50% of time was spent on reviewing his disease status, laboratory review and discussing his future treatment plan.  Zola Button, MD 5/1/20202:57 PM

## 2018-07-18 ENCOUNTER — Encounter: Payer: Self-pay | Admitting: Family Medicine

## 2018-07-18 ENCOUNTER — Ambulatory Visit (INDEPENDENT_AMBULATORY_CARE_PROVIDER_SITE_OTHER): Payer: Medicare Other | Admitting: Family Medicine

## 2018-07-18 VITALS — BP 128/73 | HR 64 | Ht 71.0 in | Wt 187.0 lb

## 2018-07-18 DIAGNOSIS — E0840 Diabetes mellitus due to underlying condition with diabetic neuropathy, unspecified: Secondary | ICD-10-CM | POA: Diagnosis not present

## 2018-07-18 DIAGNOSIS — I10 Essential (primary) hypertension: Secondary | ICD-10-CM | POA: Diagnosis not present

## 2018-07-18 MED ORDER — DAPAGLIFLOZIN PROPANEDIOL 5 MG PO TABS: 5.0000 mg | ORAL_TABLET | Freq: Every day | ORAL | 3 refills | Status: DC

## 2018-07-18 MED ORDER — ONDANSETRON HCL 4 MG PO TABS: 4.0000 mg | ORAL_TABLET | Freq: Three times a day (TID) | ORAL | 0 refills | Status: DC | PRN

## 2018-07-18 NOTE — Patient Instructions (Signed)
Stay off trulictiy.  Start low dose of farxiga.  Follow Blood sugars at home.. email the measurements in 2 weeks.

## 2018-07-18 NOTE — Progress Notes (Signed)
VIRTUAL VISIT Due to national recommendations of social distancing due to [place] 19, a virtual visit is felt to be most appropriate for this Yang at this time.   I connected with the Yang on 07/18/18 at 10:40 AM EDT by virtual telehealth platform and verified that I am speaking with the correct person using two identifiers.   I discussed the limitations, risks, security and privacy concerns of performing an evaluation and management service by  virtual telehealth platform and the availability of in person appointments. I also discussed with the Yang that there may be a Yang responsible charge related to this service. The Yang expressed understanding and agreed to proceed.  Yang location: Home Provider Location: [name] Participants: Alexander Yang and [facility]   Chief Complaint  Yang presents with  . Diabetes    History of Present Illness: 78 year old male presents for follow up DM.  Diabetes:  Recent trial of Trulicity.. possibly connected with episode of diarrhea abd pain, nausea , emesis.  Symptoms had started with diarrhea and lightheadedness after working in yard. Was so dizzy he felt he could not get head up off table.. possible LOC  Emesis and dizziness  Resolved in 1 hour. Diarrhea.. lasted 2 days He denies any epigastric pain, no radiation to back.  On metformin max tolerated dose.  He noted FBS 120s for a few days after taking the injection. Lab Results  Component Value Date   HGBA1C 8.1 (A) 07/01/2018  Using medications without difficulties: Hypoglycemic episodes: Hyperglycemic episodes: Feet problems:none Blood Sugars averaging: off trulicity  FBS gping up 147-158  BP in last week .Marland KitchenMarland Kitchen115-120/70 COVID 19 screen No recent travel or known exposure to COVID19 The Yang denies respiratory symptoms of COVID 19 at this time.  The importance of social distancing was discussed today.   Review of Systems  Constitutional: Negative for  chills and fever.  HENT: Negative for congestion and ear pain.   Eyes: Negative for pain and redness.  Respiratory: Negative for cough and shortness of breath.   Cardiovascular: Negative for chest pain, palpitations and leg swelling.  Gastrointestinal: Positive for diarrhea, nausea and vomiting. Negative for abdominal pain, blood in stool and constipation.  Genitourinary: Negative for dysuria.  Musculoskeletal: Negative for falls and myalgias.  Skin: Negative for rash.  Neurological: Positive for loss of consciousness. Negative for dizziness.  Psychiatric/Behavioral: Negative for depression. The Yang is not nervous/anxious.       Past Medical History:  Diagnosis Date  . Cerebrovascular accident ([place])   . Diabetes mellitus without complication ([place])    controlled with diet  . Hypertension   . [place] ([facility]) Stroke Scale dysarthria score 1, mild to moderate dysarthria, Yang slurs at least some words and, at worst, can be understood with some difficulty 1980  . Osteoporosis   . Prostate cancer [facility])     reports that he quit smoking about 53 years ago. His smoking use included cigarettes. He has a 20.00 pack-year smoking history. He has never used smokeless tobacco. He reports current alcohol use of about 7.0 standard drinks of alcohol per week. He reports that he does not use drugs.   Current Outpatient Medications:  .  aspirin 81 MG tablet, Take 81 mg by mouth every other day., Disp: , Rfl:  .  Blood Glucose Monitoring Suppl (ACCU-CHEK AVIVA PLUS) w/Device KIT, Use to check blood sugar 2 times a day.  Dx: E11.49, Disp: 1 kit, Rfl: 0 .  CALCIUM-VITAMIN D  PO, Take 1 tablet by mouth daily., Disp: , Rfl:  .  dicyclomine (BENTYL) 10 MG capsule, Take 1 capsule (10 mg total) by mouth every morning., Disp: 60 capsule, Rfl: 3 .  glucose blood (ACCU-CHEK AVIVA) test strip, Use to check blood sugar 2 times a day.  Dx: E11.49, Disp: 100 each, Rfl: 11 .  ketoconazole  (NIZORAL) 2 % cream, Apply 1 application topically daily as needed for irritation., Disp: , Rfl:  .  Lancets (ACCU-CHEK SOFT TOUCH) lancets, Use to check blood sugar 2 times a day.  Dx: E11.49, Disp: 100 each, Rfl: 11 .  Lancets Misc. (ACCU-CHEK SOFTCLIX LANCET DEV) KIT, Use to check blood sugar 2 times a day.  Dx: E11.49, Disp: 1 kit, Rfl: 0 .  leuprolide (LUPRON) 7.5 MG injection, Inject 7.5 mg into the muscle every 28 (twenty-eight) days., Disp: 1 each, Rfl: 0 .  losartan (COZAAR) 100 MG tablet, Take 1 tablet (100 mg total) by mouth daily., Disp: 30 tablet, Rfl: 11 .  metFORMIN (GLUCOPHAGE-XR) 500 MG 24 hr tablet, Take 2 tablets (1,000 mg total) by mouth daily with breakfast., Disp: 180 tablet, Rfl: 0 .  Misc. Devices MISC, Trulicity pen needles.  Weekly injection., Disp: 30 each, Rfl: 0 .  ondansetron (ZOFRAN) 4 MG tablet, Take 1-2 tablets (4-8 mg total) by mouth every 8 (eight) hours as needed for nausea or vomiting., Disp: 20 tablet, Rfl: 0 .  pravastatin (PRAVACHOL) 40 MG tablet, TAKE 1 TABLET BY MOUTH ONCE DAILY, Disp: 90 tablet, Rfl: 3 .  Dulaglutide (TRULICITY) 8.33 AS/5.0NL SOPN, Inject 0.75 mg into the skin once a week. (Yang not taking: Reported on 07/18/2018), Disp: 2 mL, Rfl: 11   Observations/Objective: Blood pressure 128/73, pulse 64, height _0  (1.803 m), weight 187 lb (84.8 kg).  Physical Exam  Physical Exam Constitutional:      General: The Yang is not in acute distress. Pulmonary:     Effort: Pulmonary effort is normal. No respiratory distress.  Neurological:     Mental Status: The Yang is alert and oriented to person, place, and time.  Psychiatric:        Mood and Affect: Mood normal.        Behavior: Behavior normal.   Assessment and Plan Diabetes mellitus with neurological manifestations, controlled ([place]) Given possible severe SE to trulicity.. stop.  Will start farxiga 5 mg daily instead in addition to metformin at max tolerated dose.  He will send in  FBS in 2 weeks to determine if med can be increased. Next A1C in 09/2018.  HTN (hypertension) Improved control.     I discussed the assessment and treatment plan with the Yang. The Yang was provided an opportunity to ask questions and all were answered. The Yang agreed with the plan and demonstrated an understanding of the instructions.   The Yang was advised to call back or seek an in-person evaluation if the symptoms worsen or if the condition fails to improve as anticipated.     Alexander Lofts, MD

## 2018-07-18 NOTE — Assessment & Plan Note (Signed)
Given possible severe SE to trulicity.. stop.  Will start farxiga 5 mg daily instead in addition to metformin at max tolerated dose.  He will send in FBS in 2 weeks to determine if med can be increased. Next A1C in 09/2018.

## 2018-07-18 NOTE — Assessment & Plan Note (Signed)
Improved control. 

## 2018-07-28 DIAGNOSIS — E0840 Diabetes mellitus due to underlying condition with diabetic neuropathy, unspecified: Secondary | ICD-10-CM

## 2018-07-28 MED ORDER — GLUCOSE BLOOD VI STRP: ORAL_STRIP | 11 refills | Status: DC

## 2018-07-28 MED ORDER — ACCU-CHEK SOFT TOUCH LANCETS MISC: 11 refills | Status: DC

## 2018-07-28 NOTE — Telephone Encounter (Signed)
Refills for test strips and lancets has been sent to [facility] as patient requested.

## 2018-07-30 DIAGNOSIS — E0840 Diabetes mellitus due to underlying condition with diabetic neuropathy, unspecified: Secondary | ICD-10-CM

## 2018-07-30 MED ORDER — GLUCOSE BLOOD VI STRP: ORAL_STRIP | 11 refills | Status: DC

## 2018-07-30 MED ORDER — ACCU-CHEK SOFT TOUCH LANCETS MISC: 11 refills | Status: DC

## 2018-07-30 MED ORDER — ACCU-CHEK AVIVA PLUS W/DEVICE KIT: PACK | 0 refills | Status: DC

## 2018-07-30 NOTE — Addendum Note (Signed)
Addended by: Carter Kitten on: 07/30/2018 03:08 PM   Modules accepted: Orders

## 2018-08-02 ENCOUNTER — Encounter: Payer: Self-pay | Admitting: Oncology

## 2018-08-08 ENCOUNTER — Other Ambulatory Visit: Payer: Self-pay

## 2018-08-08 ENCOUNTER — Inpatient Hospital Stay: Payer: Medicare Other

## 2018-08-08 VITALS — BP 138/72 | HR 68 | Temp 98.2°F | Resp 17

## 2018-08-08 DIAGNOSIS — Z5111 Encounter for antineoplastic chemotherapy: Secondary | ICD-10-CM | POA: Diagnosis not present

## 2018-08-08 DIAGNOSIS — C61 Malignant neoplasm of prostate: Secondary | ICD-10-CM

## 2018-08-08 MED ORDER — LEUPROLIDE ACETATE 7.5 MG IM KIT
7.5000 mg | PACK | Freq: Once | INTRAMUSCULAR | Status: AC
  Administered 2018-08-08: 7.5 mg via INTRAMUSCULAR
  Filled 2018-08-08: qty 7.5

## 2018-08-08 NOTE — Patient Instructions (Signed)

## 2018-08-18 ENCOUNTER — Encounter: Payer: Self-pay | Admitting: Oncology

## 2018-08-21 ENCOUNTER — Telehealth: Payer: Self-pay | Admitting: Oncology

## 2018-08-21 NOTE — Telephone Encounter (Signed)
Added appointments for July/August. Left message for patient. Also confirmed 6/26 appointment. Patient to get updated schedule 6/26.

## 2018-09-05 ENCOUNTER — Inpatient Hospital Stay: Payer: Medicare Other | Attending: Oncology

## 2018-09-05 ENCOUNTER — Ambulatory Visit: Payer: Medicare Other

## 2018-09-05 ENCOUNTER — Other Ambulatory Visit: Payer: Self-pay

## 2018-09-05 VITALS — BP 140/72 | HR 64 | Temp 99.5°F | Resp 18

## 2018-09-05 DIAGNOSIS — Z79899 Other long term (current) drug therapy: Secondary | ICD-10-CM | POA: Insufficient documentation

## 2018-09-05 DIAGNOSIS — Z923 Personal history of irradiation: Secondary | ICD-10-CM | POA: Insufficient documentation

## 2018-09-05 DIAGNOSIS — R9721 Rising PSA following treatment for malignant neoplasm of prostate: Secondary | ICD-10-CM | POA: Insufficient documentation

## 2018-09-05 DIAGNOSIS — C61 Malignant neoplasm of prostate: Secondary | ICD-10-CM | POA: Insufficient documentation

## 2018-09-05 DIAGNOSIS — K529 Noninfective gastroenteritis and colitis, unspecified: Secondary | ICD-10-CM | POA: Insufficient documentation

## 2018-09-05 DIAGNOSIS — Z5111 Encounter for antineoplastic chemotherapy: Secondary | ICD-10-CM | POA: Diagnosis not present

## 2018-09-05 MED ORDER — LEUPROLIDE ACETATE 7.5 MG IM KIT
7.5000 mg | PACK | Freq: Once | INTRAMUSCULAR | Status: AC
  Administered 2018-09-05: 7.5 mg via INTRAMUSCULAR
  Filled 2018-09-05: qty 7.5

## 2018-09-15 ENCOUNTER — Other Ambulatory Visit: Payer: Self-pay | Admitting: Family Medicine

## 2018-10-03 ENCOUNTER — Other Ambulatory Visit: Payer: Self-pay

## 2018-10-03 ENCOUNTER — Inpatient Hospital Stay: Payer: Medicare Other | Attending: Oncology

## 2018-10-03 VITALS — BP 142/74 | HR 68 | Temp 98.7°F | Resp 18

## 2018-10-03 DIAGNOSIS — C61 Malignant neoplasm of prostate: Secondary | ICD-10-CM | POA: Insufficient documentation

## 2018-10-03 DIAGNOSIS — Z5111 Encounter for antineoplastic chemotherapy: Secondary | ICD-10-CM | POA: Diagnosis not present

## 2018-10-03 MED ORDER — LEUPROLIDE ACETATE 7.5 MG IM KIT
7.5000 mg | PACK | Freq: Once | INTRAMUSCULAR | Status: AC
  Administered 2018-10-03: 13:00:00 7.5 mg via INTRAMUSCULAR
  Filled 2018-10-03: qty 7.5

## 2018-10-03 NOTE — Patient Instructions (Signed)

## 2018-10-17 ENCOUNTER — Telehealth: Payer: Self-pay | Admitting: Family Medicine

## 2018-10-17 DIAGNOSIS — E0840 Diabetes mellitus due to underlying condition with diabetic neuropathy, unspecified: Secondary | ICD-10-CM

## 2018-10-17 NOTE — Telephone Encounter (Signed)
-----   Message from Ellamae Sia sent at 10/13/2018  3:03 PM EDT ----- Regarding: Lab orders for Monday, 8.10.20 Lab orders for a 3 month follow up appt.

## 2018-10-20 ENCOUNTER — Other Ambulatory Visit (INDEPENDENT_AMBULATORY_CARE_PROVIDER_SITE_OTHER): Payer: Medicare Other

## 2018-10-20 ENCOUNTER — Other Ambulatory Visit: Payer: Self-pay

## 2018-10-20 DIAGNOSIS — E1140 Type 2 diabetes mellitus with diabetic neuropathy, unspecified: Secondary | ICD-10-CM

## 2018-10-20 DIAGNOSIS — E0840 Diabetes mellitus due to underlying condition with diabetic neuropathy, unspecified: Secondary | ICD-10-CM

## 2018-10-20 LAB — COMPREHENSIVE METABOLIC PANEL
ALT: 12 U/L (ref 0–53)
AST: 14 U/L (ref 0–37)
Albumin: 4 g/dL (ref 3.5–5.2)
Alkaline Phosphatase: 35 U/L — ABNORMAL LOW (ref 39–117)
BUN: 19 mg/dL (ref 6–23)
CO2: 26 mEq/L (ref 19–32)
Calcium: 9.7 mg/dL (ref 8.4–10.5)
Chloride: 106 mEq/L (ref 96–112)
Creatinine, Ser: 0.83 mg/dL (ref 0.40–1.50)
GFR: 89.67 mL/min (ref >60.00)
Glucose, Bld: 136 mg/dL — ABNORMAL HIGH (ref 70–99)
Potassium: 4.4 mEq/L (ref 3.5–5.1)
Sodium: 142 mEq/L (ref 135–145)
Total Bilirubin: 0.4 mg/dL (ref 0.2–1.2)
Total Protein: 6.7 g/dL (ref 6.0–8.3)

## 2018-10-20 LAB — HEMOGLOBIN A1C: Hgb A1c MFr Bld: 7.3 % — ABNORMAL HIGH (ref 4.6–6.5)

## 2018-10-20 LAB — LIPID PANEL
Cholesterol: 145 mg/dL (ref 0–200)
HDL: 46.6 mg/dL (ref >39.00)
LDL Cholesterol: 85 mg/dL (ref 0–99)
NonHDL: 98.63
Total CHOL/HDL Ratio: 3
Triglycerides: 67 mg/dL (ref 0.0–149.0)
VLDL: 13.4 mg/dL (ref 0.0–40.0)

## 2018-10-21 ENCOUNTER — Other Ambulatory Visit: Payer: Self-pay

## 2018-10-21 ENCOUNTER — Encounter: Payer: Self-pay | Admitting: Family Medicine

## 2018-10-21 ENCOUNTER — Ambulatory Visit: Payer: Medicare Other | Admitting: Family Medicine

## 2018-10-21 ENCOUNTER — Ambulatory Visit (INDEPENDENT_AMBULATORY_CARE_PROVIDER_SITE_OTHER): Payer: Medicare Other | Admitting: Family Medicine

## 2018-10-21 VITALS — Ht 71.0 in

## 2018-10-21 DIAGNOSIS — E0840 Diabetes mellitus due to underlying condition with diabetic neuropathy, unspecified: Secondary | ICD-10-CM | POA: Diagnosis not present

## 2018-10-21 DIAGNOSIS — E1159 Type 2 diabetes mellitus with other circulatory complications: Secondary | ICD-10-CM | POA: Diagnosis not present

## 2018-10-21 DIAGNOSIS — E1169 Type 2 diabetes mellitus with other specified complication: Secondary | ICD-10-CM

## 2018-10-21 DIAGNOSIS — E1142 Type 2 diabetes mellitus with diabetic polyneuropathy: Secondary | ICD-10-CM | POA: Diagnosis not present

## 2018-10-21 DIAGNOSIS — E785 Hyperlipidemia, unspecified: Secondary | ICD-10-CM

## 2018-10-21 DIAGNOSIS — I152 Hypertension secondary to endocrine disorders: Secondary | ICD-10-CM

## 2018-10-21 DIAGNOSIS — I10 Essential (primary) hypertension: Secondary | ICD-10-CM | POA: Diagnosis not present

## 2018-10-21 NOTE — Assessment & Plan Note (Addendum)
Improving control on farxiga 5 mg in addition to max tolerated dose of metformin. Mild SE.. wishes to stay on the same dose.   SE to trulicity in past, severe.

## 2018-10-21 NOTE — Assessment & Plan Note (Signed)
Well controlled. Continue current medication.  

## 2018-10-21 NOTE — Assessment & Plan Note (Signed)
HX of CVA.Marland Kitchen almost at goal < 70on pravastatin.

## 2018-10-21 NOTE — Patient Instructions (Signed)
Try to get back to regular exercise. Continue current meds.

## 2018-10-21 NOTE — Progress Notes (Signed)
VIRTUAL VISIT Due to national recommendations of social distancing due to [place] 19, a virtual visit is felt to be most appropriate for this patient at this time.   I connected with the patient on 10/21/18 at  9:40 AM EDT by virtual telehealth platform and verified that I am speaking with the correct person using two identifiers.   I discussed the limitations, risks, security and privacy concerns of performing an evaluation and management service by  virtual telehealth platform and the availability of in person appointments. I also discussed with the patient that there may be a patient responsible charge related to this service. The patient expressed understanding and agreed to proceed.  Patient location: Home Provider Location: [facility] Participants: Eliezer Lofts and [facility]   Chief Complaint  Patient presents with  . Diabetes    History of Present Illness: 78 year old male presents for 3 month follow up DM.  Diabetes:  Improved control in last 3 months   He has now started farxiga in last 3 months... seems to be more thirsty, does have to get up every three hours at night. He fels he may be more bloated, mild nausea at times... gas x helps. Lab Results  Component Value Date   HGBA1C 7.3 (H) 10/20/2018  Using medications without difficulties: Hypoglycemic episodes:none Hyperglycemic episodes:none Feet problems: no ulcers Blood Sugars averaging: FBS 140-160 eye exam within last year: due GFR 89!  Elevated Cholesterol:  LDL almost at goal <70 on pravastatin Lab Results  Component Value Date   CHOL 145 10/20/2018   HDL 46.60 10/20/2018   LDLCALC 85 10/20/2018   TRIG 67.0 10/20/2018   CHOLHDL 3 10/20/2018  Using medications without problems: Muscle aches:  Diet compliance: good Exercise: walking Other complaints: Hx of CVA.  Hypertension:  Good control at home on ACEI BP Readings from Last 3 Encounters:  10/03/18 (!) 142/74  09/05/18 140/72  08/08/18  138/72  Using medication without problems or lightheadedness:none  Chest pain with exertion:none Edema:none Short of breath:none Average home BPs: 120/70 Other issues:   COVID 19 screen No recent travel or known exposure to [place] The patient denies respiratory symptoms of COVID 19 at this time.  The importance of social distancing was discussed today.   Review of Systems  Constitutional: Negative for chills and fever.  HENT: Negative for congestion and ear pain.   Eyes: Negative for pain and redness.  Respiratory: Negative for cough and shortness of breath.   Cardiovascular: Negative for chest pain, palpitations and leg swelling.  Gastrointestinal: Negative for abdominal pain, blood in stool, constipation, diarrhea, nausea and vomiting.  Genitourinary: Negative for dysuria.  Musculoskeletal: Negative for falls and myalgias.  Skin: Negative for rash.  Neurological: Negative for dizziness.  Psychiatric/Behavioral: Negative for depression. The patient is not nervous/anxious.       Past Medical History:  Diagnosis Date  . Cerebrovascular accident ([place])   . Diabetes mellitus without complication ([place])    controlled with diet  . Hypertension   . 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 Osteoporosis   . Prostate cancer [facility])     reports that he quit smoking about 53 years ago. His smoking use included cigarettes. He has a 20.00 pack-year smoking history. He has never used smokeless tobacco. He reports current alcohol use of about 7.0 standard drinks of alcohol  per week. He reports that he does not use drugs.   Current Outpatient Medications:  .  aspirin 81 MG tablet, Take 81 mg by mouth every other day., Disp: , Rfl:  .  Blood Glucose Monitoring Suppl (ACCU-CHEK AVIVA PLUS) w/Device KIT, Use to check blood sugar 2 times a day.   Dx: E11.49, Disp: 1 kit, Rfl: 0 .  CALCIUM-VITAMIN D PO, Take 1 tablet by mouth daily., Disp: , Rfl:  .  dapagliflozin propanediol (FARXIGA) 5 MG TABS tablet, Take 5 mg by mouth daily., Disp: 30 tablet, Rfl: 3 .  dicyclomine (BENTYL) 10 MG capsule, Take 1 capsule (10 mg total) by mouth every morning., Disp: 60 capsule, Rfl: 3 .  glucose blood (ACCU-CHEK AVIVA) test strip, Use to check blood sugar 2 times a day.  Dx: E11.49, Disp: 100 each, Rfl: 11 .  ketoconazole (NIZORAL) 2 % cream, Apply 1 application topically daily as needed for irritation., Disp: , Rfl:  .  Lancets (ACCU-CHEK SOFT TOUCH) lancets, Use to check blood sugar 2 times a day.  Dx: E11.49, Disp: 100 each, Rfl: 11 .  Lancets Misc. (ACCU-CHEK SOFTCLIX LANCET DEV) KIT, Use to check blood sugar 2 times a day.  Dx: E11.49, Disp: 1 kit, Rfl: 0 .  leuprolide (LUPRON) 7.5 MG injection, Inject 7.5 mg into the muscle every 28 (twenty-eight) days., Disp: 1 each, Rfl: 0 .  losartan (COZAAR) 100 MG tablet, Take 1 tablet (100 mg total) by mouth daily., Disp: 30 tablet, Rfl: 11 .  metFORMIN (GLUCOPHAGE-XR) 500 MG 24 hr tablet, TAKE 2 TABLETS BY MOUTH EVERY DAY WITH BREAKFAST, Disp: 180 tablet, Rfl: 1 .  Misc. Devices MISC, Trulicity pen needles.  Weekly injection., Disp: 30 each, Rfl: 0 .  ondansetron (ZOFRAN) 4 MG tablet, Take 1-2 tablets (4-8 mg total) by mouth every 8 (eight) hours as needed for nausea or vomiting., Disp: 20 tablet, Rfl: 0 .  pravastatin (PRAVACHOL) 40 MG tablet, TAKE 1 TABLET BY MOUTH ONCE DAILY, Disp: 90 tablet, Rfl: 3 .  Psyllium (METAMUCIL FIBER PO), Take 1 capsule by mouth daily., Disp: , Rfl:    Observations/Objective: Height 5' 11"  (1.803 m).  Physical Exam  Physical Exam Constitutional:      General: The patient is not in acute distress. Pulmonary:     Effort: Pulmonary effort is normal. No respiratory distress.  Neurological:     Mental Status: The patient is alert and oriented to person, place, and time.   Psychiatric:        Mood and Affect: Mood normal.        Behavior: Behavior normal.   Assessment and Plan Hypertension associated with diabetes ([place]) Well controlled. Continue current medication.   Hyperlipidemia associated with type 2 diabetes mellitus ([facility]) HX of CVA.Marland Kitchen almost at goal < 70on pravastatin.  Diabetes mellitus with neurological manifestations, controlled ([place]) Improving control on farxiga 5 mg in addition to max tolerated dose of metformin. Mild SE.. wishes to stay on the same dose.   SE to trulicity in past, severe.     I discussed the assessment and treatment plan with the patient. The patient was provided an opportunity to ask questions and all were answered. The patient agreed with the plan and demonstrated an understanding of the instructions.   The patient was advised to call back or seek an in-person evaluation if the symptoms worsen or if the condition fails to improve as anticipated.     Eliezer Lofts, MD

## 2018-10-24 DIAGNOSIS — C61 Malignant neoplasm of prostate: Secondary | ICD-10-CM | POA: Diagnosis not present

## 2018-10-31 ENCOUNTER — Inpatient Hospital Stay: Payer: Medicare Other

## 2018-10-31 ENCOUNTER — Other Ambulatory Visit: Payer: Self-pay

## 2018-10-31 ENCOUNTER — Inpatient Hospital Stay: Payer: Medicare Other | Attending: Oncology | Admitting: Oncology

## 2018-10-31 VITALS — BP 140/79 | HR 79 | Temp 98.7°F | Resp 20 | Ht 71.0 in | Wt 180.0 lb

## 2018-10-31 DIAGNOSIS — Z7982 Long term (current) use of aspirin: Secondary | ICD-10-CM | POA: Insufficient documentation

## 2018-10-31 DIAGNOSIS — C61 Malignant neoplasm of prostate: Secondary | ICD-10-CM | POA: Diagnosis not present

## 2018-10-31 DIAGNOSIS — Z79899 Other long term (current) drug therapy: Secondary | ICD-10-CM | POA: Insufficient documentation

## 2018-10-31 DIAGNOSIS — Z7984 Long term (current) use of oral hypoglycemic drugs: Secondary | ICD-10-CM | POA: Diagnosis not present

## 2018-10-31 DIAGNOSIS — E119 Type 2 diabetes mellitus without complications: Secondary | ICD-10-CM | POA: Diagnosis not present

## 2018-10-31 MED ORDER — LOSARTAN POTASSIUM 100 MG PO TABS: 100.0000 mg | ORAL_TABLET | Freq: Every day | ORAL | 3 refills | Status: DC

## 2018-10-31 NOTE — Progress Notes (Signed)
Hematology and Oncology Follow Up Visit  Alexander Yang [id number] 30-May-1940 78 y.o. 10/31/2018 12:01 PM Yang, Alexander E, [name], Alexander E, MD   Principle Diagnosis: 78 year old man with prostate cancer diagnosed in 2009 with a Gleason score 6 and a PSA of 3.17.  He developed castration-sensitive with biochemical relapse only in 2018.   Prior Therapy: Observation and surveillance between 2009 in 2011.  He was found to have a Gleason score 4+4 = 8 in 2011. He received radiation therapy between October 2011 until December 2011.   He received concomitant Taxotere chemotherapy as a part of a clinical trial. He also received 6 months of hormone therapy up until July 2012. His PSA nadir was down to 0.1 In December 2013.   His PSA continue to be low close to 0.1 and 0.2 Up till December 2015.   In June 2016 was up to 0.4 and his most recent PSA in September 2016 was up to 0.7.    Current therapy:   Androgen deprivation therapy with Lupron at 7.5 mg on a monthly basis started in March 2018.  Last injection will be given on January 08, 2017 and has been on hold since that time.   Lupron 7.5 mg monthly resumed on October 10, 2017.    Interim History:  Alexander Yang is here for a repeat evaluation.  Since the last visit, he reports no major changes in his health.  He has reported some decline in his appetite related to diabetic medication but overall feels well without any changes in his performance status or activity level.  Does report chronic diarrhea which is unchanged.  He denies any bone pain or pathological fractures.  He denies any recent hospitalization or illnesses.  He denied any alteration mental status, neuropathy, confusion or dizziness.  Denies any headaches or lethargy.  Denies any night sweats, weight loss or changes in appetite.  Denied orthopnea, dyspnea on exertion or chest discomfort.  Denies shortness of breath, difficulty breathing hemoptysis or cough.  Denies any  abdominal distention early satiety or dyspepsia.  Denies any hematuria, frequency, dysuria or nocturia.  Denies any skin irritation, dryness or rash.  Denies any ecchymosis or petechiae.  Denies any lymphadenopathy or clotting.  Denies any heat or cold intolerance.  Denies any anxiety or depression.  Remaining review of system is negative.            Medications: Updated on review. Current Outpatient Medications  Medication Sig Dispense Refill  . aspirin 81 MG tablet Take 81 mg by mouth every other day.    . Blood Glucose Monitoring Suppl (ACCU-CHEK AVIVA PLUS) w/Device KIT Use to check blood sugar 2 times a day.  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 CALCIUM-VITAMIN D PO Take 1 tablet by mouth daily.    . dapagliflozin propanediol (FARXIGA) 5 MG TABS tablet Take 5 mg by mouth daily. 30 tablet 3  . dicyclomine (BENTYL) 10 MG capsule Take 1 capsule (10 mg total) by mouth every morning. 60 capsule 3  . glucose blood (ACCU-CHEK AVIVA) test strip Use to check blood sugar 2 times a day.  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 ketoconazole (NIZORAL) 2 % cream Apply 1 application topically daily as needed for irritation.    . Lancets (ACCU-CHEK SOFT TOUCH) lancets Use to check blood sugar 2 times a day.  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 Lancets Misc. (ACCU-CHEK SOFTCLIX LANCET DEV) KIT Use to check blood sugar 2 times a day.  Dx: E11.49  1 kit 0  . leuprolide (LUPRON) 7.5 MG injection Inject 7.5 mg into the muscle every 28 (twenty-eight) days. 1 each 0  . losartan (COZAAR) 100 MG tablet Take 1 tablet (100 mg total) by mouth daily. 90 tablet 3  . metFORMIN (GLUCOPHAGE-XR) 500 MG 24 hr tablet TAKE 2 TABLETS BY MOUTH EVERY DAY WITH BREAKFAST 180 tablet 1  . Misc. Devices MISC Trulicity pen needles.  Weekly injection. 30 each 0  . ondansetron (ZOFRAN) 4 MG tablet Take 1-2 tablets (4-8 mg total) by mouth every 8 (eight) hours as needed for nausea or vomiting. 20 tablet 0  . pravastatin (PRAVACHOL) 40 MG tablet TAKE 1 TABLET BY MOUTH  ONCE DAILY 90 tablet 3  . Psyllium (METAMUCIL FIBER PO) Take 1 capsule by mouth daily.     No current facility-administered medications for this visit.      Allergies: No Known Allergies  Past Medical History, Surgical history, Social history, and Family History without change on update.    Physical exam:  Blood pressure 140/79, pulse 79, temperature 98.7 F (37.1 C), temperature source Oral, resp. rate 20, height 5' 11"  (1.803 m), weight 180 lb (81.6 kg), SpO2 100 %.    ECOG 0.     General appearance: Alert, awake without any distress. Head: Atraumatic without abnormalities Oropharynx: Without any thrush or ulcers. Eyes: No scleral icterus. Lymph nodes: No lymphadenopathy noted in the cervical, supraclavicular, or axillary nodes Heart:regular rate and rhythm, without any murmurs or gallops.   Lung: Clear to auscultation without any rhonchi, wheezes or dullness to percussion. Abdomin: Soft, nontender without any shifting dullness or ascites. Musculoskeletal: No clubbing or cyanosis. Neurological: No motor or sensory deficits. Skin: No rashes or lesions.    PSA on 04/20/2015 was 2.6.  PSA on 07/19/2015 was 3.7. PSA on 10/18/2015 was 3.2. PSA on 01/11/2016 was 4.4. PSA on 04/12/2016 was 8.2. Lupron started in March 2018. PSA on 08/23/2016 was 0.7 PSA in August 2018 was 0.7. PSA in 12/2016 0.7.  Testosterone was less than 3. PSA on April 03, 2017 was 1.5. PSA on July 02, 2017 was 5.7. PSA on 10/02/2017 was 13.7 PSA 01/02/3018 3.9 PSA on April 03, 2018 was 3.4. PSA on April 22 of 2020 was 3.0. BSA on October 24, 2018 was 2.6   Impression and Plan:   78 year old man with  1.  Biochemical relapse of a prostate cancer noted in 2018.  He was initially diagnosed in 2009 with a Gleason score 6.  He is currently on monthly Lupron without any major complications with reasonable PSA control at this time.  His PSA in August 2020 showed slight decline in overall  stability.  Risks and benefits of continuing this approach was discussed and different salvage therapy options were also reviewed.  These would include Nicki Reaper and systemic chemotherapy.  After discussion today, he elected to proceed with intermittent androgen deprivation and will like to hold androgen deprivation for period of time and potentially resume it in 3 months.  2.  Chronic diarrhea: Manageable at this time with conservative measures.  3. Follow-up: In 3 months for repeat evaluation.  15  minutes was spent with the patient face-to-face today.  More than 50% of time was dedicated to updating his disease status, treatment options and reviewing future plan of care.  Zola Button, MD 8/21/202012:01 PM

## 2018-11-03 ENCOUNTER — Telehealth: Payer: Self-pay | Admitting: Oncology

## 2018-11-03 NOTE — Telephone Encounter (Signed)
Called and left msg. Mailed printout  °

## 2018-11-12 ENCOUNTER — Encounter: Payer: Self-pay | Admitting: Oncology

## 2018-11-13 ENCOUNTER — Ambulatory Visit (INDEPENDENT_AMBULATORY_CARE_PROVIDER_SITE_OTHER): Payer: Medicare Other

## 2018-11-13 DIAGNOSIS — Z23 Encounter for immunization: Secondary | ICD-10-CM | POA: Diagnosis not present

## 2018-11-13 LAB — HM DIABETES EYE EXAM

## 2018-11-19 ENCOUNTER — Other Ambulatory Visit: Payer: Self-pay | Admitting: Family Medicine

## 2019-01-26 DIAGNOSIS — C61 Malignant neoplasm of prostate: Secondary | ICD-10-CM | POA: Diagnosis not present

## 2019-02-03 ENCOUNTER — Telehealth: Payer: Self-pay

## 2019-02-03 ENCOUNTER — Other Ambulatory Visit: Payer: Self-pay

## 2019-02-03 ENCOUNTER — Inpatient Hospital Stay: Payer: Medicare Other | Attending: Oncology | Admitting: Oncology

## 2019-02-03 ENCOUNTER — Inpatient Hospital Stay: Payer: Medicare Other

## 2019-02-03 VITALS — BP 146/78 | HR 66 | Temp 97.9°F | Resp 18 | Ht 71.0 in | Wt 185.7 lb

## 2019-02-03 DIAGNOSIS — R197 Diarrhea, unspecified: Secondary | ICD-10-CM | POA: Diagnosis not present

## 2019-02-03 DIAGNOSIS — Z923 Personal history of irradiation: Secondary | ICD-10-CM | POA: Insufficient documentation

## 2019-02-03 DIAGNOSIS — C61 Malignant neoplasm of prostate: Secondary | ICD-10-CM | POA: Insufficient documentation

## 2019-02-03 DIAGNOSIS — E291 Testicular hypofunction: Secondary | ICD-10-CM | POA: Insufficient documentation

## 2019-02-03 DIAGNOSIS — Z5111 Encounter for antineoplastic chemotherapy: Secondary | ICD-10-CM | POA: Diagnosis not present

## 2019-02-03 DIAGNOSIS — Z79899 Other long term (current) drug therapy: Secondary | ICD-10-CM | POA: Insufficient documentation

## 2019-02-03 MED ORDER — LEUPROLIDE ACETATE 7.5 MG ~~LOC~~ KIT
7.5000 mg | PACK | Freq: Once | SUBCUTANEOUS | Status: AC
  Administered 2019-02-03: 7.5 mg via SUBCUTANEOUS
  Filled 2019-02-03: qty 8

## 2019-02-03 MED ORDER — LEUPROLIDE ACETATE 7.5 MG IM KIT: 7.5000 mg | PACK | Freq: Once | INTRAMUSCULAR | Status: DC

## 2019-02-03 NOTE — Progress Notes (Signed)
Hematology and Oncology Follow Up Visit  Alexander Yang [id number] 03-Aug-1940 78 y.o. 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  He presented with Gleason score 6 and a PSA of 3.17 in 2009.    Prior Therapy: Observation and surveillance between 2009 in 2011.  He was found to have a Gleason score 4+4 = 8 in 2011. He received radiation therapy between October 2011 until December 2011.   He received concomitant Taxotere chemotherapy as a part of a clinical trial. He also received 6 months of hormone therapy up until July 2012. His PSA nadir was down to 0.1 In December 2013.   His PSA continue to be low close to 0.1 and 0.2 Up till December 2015.   In June 2016 was up to 0.4 and his most recent PSA in September 2016 was up to 0.7.    Current therapy:   Androgen deprivation therapy with Lupron at 7.5 mg on a monthly basis started in March 2018.  Last injection will be given on January 08, 2017 and has been on hold since that time.   Lupron 7.5 mg monthly resumed on October 10, 2017.  Last injection given in July 2020.   Interim History:  [name] is here for a follow-up.  Since the last visit, he reports no major changes in his health.  He has felt better off of Lupron although he does not have any major issues on it.  He does report some hot flashes at times.  He denies any recent hospitalization or illnesses.  He denies any bone pain or pelvic pain.   Patient denied headaches, blurry vision, syncope or seizures.  Denies any fevers, chills or sweats.  Denied chest pain, palpitation, orthopnea or leg edema.  Denied cough, wheezing or hemoptysis.  Denied nausea, vomiting or abdominal pain.  Denies any constipation or diarrhea.  Denies any frequency urgency or hesitancy.  Denies any arthralgias or myalgias.  Denies any skin rashes or lesions.   Denies any bleeding or clotting tendency.  Denies any easy bruising.  Denies any hair or nail changes.  Denies any anxiety or depression.  Remaining review of system is negative.                  Medications: Unchanged on review. Current Outpatient Medications  Medication Sig Dispense Refill  . aspirin 81 MG tablet Take 81 mg by mouth every other day.    . Blood Glucose Monitoring Suppl (ACCU-CHEK AVIVA PLUS) w/Device KIT Use to check blood sugar 2 times a day.  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 CALCIUM-VITAMIN D PO Take 1 tablet by mouth daily.    Marland Kitchen dicyclomine (BENTYL) 10 MG capsule Take 1 capsule (10 mg total) by mouth every morning. 60 capsule 3  . FARXIGA 5 MG TABS tablet TAKE 1 TABLET BY MOUTH ONCE DAILY 30 tablet 5  . glucose blood (ACCU-CHEK AVIVA) test strip Use to check blood sugar 2 times a day.  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 ketoconazole (NIZORAL) 2 % cream Apply 1 application topically daily as needed for irritation.    . Lancets (ACCU-CHEK SOFT TOUCH) lancets Use to check blood sugar 2 times a day.  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 Lancets Misc. (ACCU-CHEK SOFTCLIX LANCET DEV) KIT Use to check blood sugar 2 times a day.  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 leuprolide (  LUPRON) 7.5 MG injection Inject 7.5 mg into the muscle every 28 (twenty-eight) days. 1 each 0  . losartan (COZAAR) 100 MG tablet Take 1 tablet (100 mg total) by mouth daily. 90 tablet 3  . metFORMIN (GLUCOPHAGE-XR) 500 MG 24 hr tablet TAKE 2 TABLETS BY MOUTH EVERY DAY WITH BREAKFAST 180 tablet 1  . Misc. Devices MISC Trulicity pen needles.  Weekly injection. 30 each 0  . ondansetron (ZOFRAN) 4 MG tablet Take 1-2 tablets (4-8 mg total) by mouth every 8 (eight) hours as needed for nausea or vomiting. 20 tablet 0  . pravastatin (PRAVACHOL) 40 MG tablet TAKE 1 TABLET BY MOUTH ONCE DAILY 90 tablet 3  . Psyllium (METAMUCIL FIBER PO) Take 1 capsule by mouth daily.     No current facility-administered medications for this visit.       Allergies: No Known Allergies  Past Medical History, Surgical history, Social history, and Family History updated without any changes.    Physical exam:  Blood pressure (!) 146/78, pulse 66, temperature 97.9 F (36.6 C), temperature source Temporal, resp. rate 18, height _0  (1.803 m), weight 185 lb 11.2 oz (84.2 kg), SpO2 100 %.     ECOG 0.     General appearance: Comfortable appearing without any discomfort Head: Normocephalic without any trauma Oropharynx: Mucous membranes are moist and pink without any thrush or ulcers. Eyes: Pupils are equal and round reactive to light. Lymph nodes: No cervical, supraclavicular, inguinal or axillary lymphadenopathy.   Heart:regular rate and rhythm.  S1 and S2 without leg edema. Lung: Clear without any rhonchi or wheezes.  No dullness to percussion. Abdomin: Soft, nontender, nondistended with good bowel sounds.  No hepatosplenomegaly. Musculoskeletal: No joint deformity or effusion.  Full range of motion noted. Neurological: No deficits noted on motor, sensory and deep tendon reflex exam. Skin: No petechial rash or dryness.  Appeared moist.      PSA on 04/20/2015 was 2.6.  PSA on 07/19/2015 was 3.7. PSA on 10/18/2015 was 3.2. PSA on 01/11/2016 was 4.4. PSA on 04/12/2016 was 8.2. Lupron started in March 2018. PSA on 08/23/2016 was 0.7 PSA in August 2018 was 0.7. PSA in 12/2016 0.7.  Testosterone was less than 3. PSA on April 03, 2017 was 1.5. PSA on July 02, 2017 was 5.7. PSA on 10/02/2017 was 13.7 PSA 01/02/3018 3.9 PSA on April 03, 2018 was 3.4. PSA on April 22 of 2020 was 3.0. PSA on October 24, 2018 was 2.6 PSA on January 26, 2019 was 8.5.  Impression and Plan:   78 year old man with  1.  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    He is currently receiving intermittent androgen deprivation with a PSA continues to be under reasonable control.  His PSA did increase up  to 8.5 after holding androgen deprivation for 3 months.  Risks and benefits of resuming Lupron today was reviewed.  Hot flashes, weight gain among others in addition to osteoporosis were reiterated.  The risk of developing advanced disease with this approach was also reviewed.  Alternative treatment options would include continuous androgen deprivation.  For the time being he will resume androgen deprivation on a monthly basis and will continue to follow monitor his PSA.  He still prefers an intermittent approach for his cancer.  2.  Chronic diarrhea: Remain stable without any changes.  3. Follow-up: In 3 months.  15  minutes was spent with the patient face-to-face today.  More than 50% of time was spent on  reviewing disease status, treatment options and answering questions regarding future plan of care.Zola Button, MD 11/24/202010:31 AM

## 2019-02-03 NOTE — Patient Instructions (Addendum)

## 2019-02-03 NOTE — Telephone Encounter (Signed)
Per Maggie in Pharmacy medication changed from Lupron to [place] Pt. Will receive 7.5 mg

## 2019-02-04 ENCOUNTER — Telehealth: Payer: Self-pay | Admitting: Oncology

## 2019-02-04 NOTE — Telephone Encounter (Signed)
Scheduled appt per 11/24 los.  Left a vm of the appt date and time.

## 2019-02-09 ENCOUNTER — Encounter: Payer: Self-pay | Admitting: Oncology

## 2019-02-11 ENCOUNTER — Telehealth: Payer: Self-pay | Admitting: Oncology

## 2019-02-11 NOTE — Telephone Encounter (Signed)
Added injection to 2/24 f/u. Left message for patient. Schedule mailed.

## 2019-03-17 ENCOUNTER — Encounter: Payer: Self-pay | Admitting: Oncology

## 2019-03-18 ENCOUNTER — Telehealth: Payer: Self-pay | Admitting: *Deleted

## 2019-03-18 NOTE — Telephone Encounter (Signed)
I am not sure who called him.  There is no documentation of a call.

## 2019-03-18 NOTE — Telephone Encounter (Signed)
Patient left a voicemail stating that he was returning someone's call. No record of our anyone calling him. Patient's wife called back stating that they both have questions about the covid vaccine and her husband has already sent Dr. [name] a message regarding this. Advised patient's wife that she is out of the office today and when she sees the message she will respond.

## 2019-03-19 ENCOUNTER — Other Ambulatory Visit: Payer: Self-pay | Admitting: Family Medicine

## 2019-03-19 MED ORDER — DAPAGLIFLOZIN PROPANEDIOL 10 MG PO TABS: 10.0000 mg | ORAL_TABLET | Freq: Every day | ORAL | 11 refills | Status: DC

## 2019-03-30 ENCOUNTER — Ambulatory Visit: Payer: Medicare Other

## 2019-03-30 ENCOUNTER — Ambulatory Visit (INDEPENDENT_AMBULATORY_CARE_PROVIDER_SITE_OTHER): Payer: Medicare PPO

## 2019-03-30 ENCOUNTER — Telehealth: Payer: Self-pay | Admitting: Family Medicine

## 2019-03-30 ENCOUNTER — Other Ambulatory Visit (INDEPENDENT_AMBULATORY_CARE_PROVIDER_SITE_OTHER): Payer: Medicare PPO

## 2019-03-30 VITALS — BP 127/73 | Wt 185.0 lb

## 2019-03-30 DIAGNOSIS — E1169 Type 2 diabetes mellitus with other specified complication: Secondary | ICD-10-CM

## 2019-03-30 DIAGNOSIS — Z0000 Encounter for general adult medical examination without abnormal findings: Secondary | ICD-10-CM | POA: Diagnosis not present

## 2019-03-30 DIAGNOSIS — E0840 Diabetes mellitus due to underlying condition with diabetic neuropathy, unspecified: Secondary | ICD-10-CM

## 2019-03-30 DIAGNOSIS — R7989 Other specified abnormal findings of blood chemistry: Secondary | ICD-10-CM

## 2019-03-30 DIAGNOSIS — E785 Hyperlipidemia, unspecified: Secondary | ICD-10-CM

## 2019-03-30 LAB — COMPREHENSIVE METABOLIC PANEL
ALT: 16 U/L (ref 0–53)
AST: 14 U/L (ref 0–37)
Albumin: 4 g/dL (ref 3.5–5.2)
Alkaline Phosphatase: 39 U/L (ref 39–117)
BUN: 19 mg/dL (ref 6–23)
CO2: 27 mEq/L (ref 19–32)
Calcium: 9.4 mg/dL (ref 8.4–10.5)
Chloride: 107 mEq/L (ref 96–112)
Creatinine, Ser: 0.89 mg/dL (ref 0.40–1.50)
GFR: 82.64 mL/min (ref >60.00)
Glucose, Bld: 174 mg/dL — ABNORMAL HIGH (ref 70–99)
Potassium: 4.4 mEq/L (ref 3.5–5.1)
Sodium: 143 mEq/L (ref 135–145)
Total Bilirubin: 0.5 mg/dL (ref 0.2–1.2)
Total Protein: 6.5 g/dL (ref 6.0–8.3)

## 2019-03-30 LAB — LIPID PANEL
Cholesterol: 165 mg/dL (ref 0–200)
HDL: 51 mg/dL (ref >39.00)
LDL Cholesterol: 102 mg/dL — ABNORMAL HIGH (ref 0–99)
NonHDL: 114.17
Total CHOL/HDL Ratio: 3
Triglycerides: 60 mg/dL (ref 0.0–149.0)
VLDL: 12 mg/dL (ref 0.0–40.0)

## 2019-03-30 LAB — HEMOGLOBIN A1C: Hgb A1c MFr Bld: 7.7 % — ABNORMAL HIGH (ref 4.6–6.5)

## 2019-03-30 NOTE — Progress Notes (Signed)
Subjective:   Maze Corniel is a 79 y.o. male who presents for Medicare Annual/Subsequent preventive examination.  Review of Systems: N/A   This visit is being conducted through telemedicine via telephone at the nurse health advisor's home address due to the COVID-19 pandemic. This patient has given me verbal consent via doximity to conduct this visit, patient states they are participating from their home address. Patient and myself are on the telephone call. There is no referral for this visit. Some vital signs may be absent or patient reported.    Patient identification: identified by name, DOB, and current address   Cardiac Risk Factors include: advanced age (>89mn, >>77women);diabetes mellitus;dyslipidemia;hypertension;male gender     Objective:    Vitals: BP 127/73   Wt 185 lb (83.9 kg)   BMI 25.80 kg/m   Body mass index is 25.8 kg/m.  Advanced Directives 03/30/2019 03/28/2018 06/26/2017 03/26/2017 01/27/2016 10/26/2015 09/06/2015  Does Patient Have a Medical Advance Directive? Yes Yes Yes Yes Yes No Yes  Type of AParamedicof AOxfordLiving will HCortlandLiving will HWagonerLiving will HBrunoLiving will [place]ham[place]Living will - H[place]bornLiving will  Does patient want to make changes to medical advance directive? - - No - Patient declined - No - Patient declined - No - Patient declined  Copy of HAugustin Chart? No - copy requested No - copy requested - No - copy requested No - copy requested - No - copy requested  Would patient like information on creating a medical advance directive? - - - - - Yes - Educational materials given -    Tobacco Social History   Tobacco Use  Smoking Status Former Smoker  . Packs/day: 2.00  . Years: 10.00  . Pack years: 20.00  . Types: Cigarettes  . Quit date: 03/12/1965  . Years since quitting: 54.0   Smokeless Tobacco Never Used     Counseling given: Not Answered   Clinical Intake:  Pre-visit preparation completed: Yes  Pain : No/denies pain     Nutritional Risks: None Diabetes: No  How often do you need to have someone help you when you read instructions, pamphlets, or other written materials from your doctor or pharmacy?: 1 - Never What is the last grade level you completed in school?: 2 years of college  Interpreter Needed?: No  Information entered by :: CJohnson, LPN  Past Medical History:  Diagnosis Date  . Cerebrovascular accident ([facility]   . Diabetes mellitus without complication ([facility]    controlled with diet  . Hypertension   . 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 Osteoporosis   . Prostate cancer ([facility]    Past Surgical History:  Procedure Laterality Date  . ACL Tear  03/12/1978  . PROSTATE BIOPSY    . TONSILLECTOMY  03/13/1947  . VASECTOMY     Family History  Problem Relation Age of Onset  . Cerebral aneurysm Mother   . Diabetes Father   . Bradycardia Father   . Benign prostatic hyperplasia Father   . Supraventricular tachycardia Brother   . Supraventricular tachycardia Brother    Social History   Socioeconomic History  . Marital status: Married    Spouse name: Not on file  . Number of children: 2  . Years  of education: Not on file  . Highest education level: Not on file  Occupational History  . Occupation: Retired  Tobacco Use  . Smoking status: Former Smoker    Packs/day: 2.00    Years: 10.00    Pack years: 20.00    Types: Cigarettes    Quit date: 03/12/1965    Years since quitting: 54.0  . Smokeless tobacco: Never Used  Substance and Sexual Activity  . Alcohol use: Yes    Alcohol/week: 7.0 standard drinks    Types: 3 Glasses of wine, 4 Shots of liquor per week  . Drug use: No    . Sexual activity: Never  Other Topics Concern  . Not on file  Social History Narrative   Exercising: 3-4 times a week.   Healthy eating habits.   Full Code   Has a living will, has HCPOA: Physiological scientist (reviewed 2015)   Social Determinants of Health   Financial Resource Strain: Low Risk   . Difficulty of Paying Living Expenses: Not hard at all  Food Insecurity: No Food Insecurity  . Worried About Charity fundraiser in the Last Year: Never true  . Ran Out of Food in the Last Year: Never true  Transportation Needs: No Transportation Needs  . Lack of Transportation (Medical): No  . Lack of Transportation (Non-Medical): No  Physical Activity: Inactive  . Days of Exercise per Week: 0 days  . Minutes of Exercise per Session: 0 min  Stress: No Stress Concern Present  . Feeling of Stress : Not at all  Social Connections:   . Frequency of Communication with Friends and Family: Not on file  . Frequency of Social Gatherings with Friends and Family: Not on file  . Attends Religious Services: Not on file  . Active Member of Clubs or Organizations: Not on file  . Attends Archivist Meetings: Not on file  . Marital Status: Not on file    Outpatient Encounter Medications as of 03/30/2019  Medication Sig  . aspirin 81 MG tablet Take 81 mg by mouth every other day.  . Blood Glucose Monitoring Suppl (ACCU-CHEK AVIVA PLUS) w/Device KIT Use to check blood sugar 2 times a day.  Dx: E11.49  . CALCIUM-VITAMIN D PO Take 1 tablet by mouth daily.  . dapagliflozin propanediol (FARXIGA) 10 MG TABS tablet Take 10 mg by mouth daily before breakfast.  . dicyclomine (BENTYL) 10 MG capsule Take 1 capsule (10 mg total) by mouth every morning.  Marland Kitchen glucose blood (ACCU-CHEK AVIVA) test strip Use to check blood sugar 2 times a day.  Dx: E11.49  . ketoconazole (NIZORAL) 2 % cream Apply 1 application topically daily as needed for irritation.  . Lancets (ACCU-CHEK SOFT TOUCH) lancets Use to check blood  sugar 2 times a day.  Dx: E11.49  . Lancets Misc. (ACCU-CHEK SOFTCLIX LANCET DEV) KIT Use to check blood sugar 2 times a day.  Dx: E11.49  . leuprolide (LUPRON) 7.5 MG injection Inject 7.5 mg into the muscle every 28 (twenty-eight) days.  Marland Kitchen losartan (COZAAR) 100 MG tablet Take 1 tablet (100 mg total) by mouth daily.  . metFORMIN (GLUCOPHAGE-XR) 500 MG 24 hr tablet TAKE 2 TABLETS BY MOUTH EVERY DAY WITH BREAKFAST  . ondansetron (ZOFRAN) 4 MG tablet Take 1-2 tablets (4-8 mg total) by mouth every 8 (eight) hours as needed for nausea or vomiting.  . pravastatin (PRAVACHOL) 40 MG tablet TAKE 1 TABLET BY MOUTH ONCE DAILY  . Psyllium (METAMUCIL FIBER PO) Take  1 capsule by mouth daily.  . Misc. Devices MISC Trulicity pen needles.  Weekly injection.   No facility-administered encounter medications on file as of 03/30/2019.    Activities of Daily Living In your present state of health, do you have any difficulty performing the following activities: 03/30/2019  Hearing? N  Vision? N  Difficulty concentrating or making decisions? N  Walking or climbing stairs? N  Dressing or bathing? N  Doing errands, shopping? N  Preparing Food and eating ? N  Using the Toilet? N  In the past six months, have you accidently leaked urine? N  Do you have problems with loss of bowel control? N  Managing your Medications? N  Managing your Finances? N  Housekeeping or managing your Housekeeping? N  Some recent data might be hidden    Patient Care Team: Jinny Sanders, MD as PCP - General Alen Blew Mathis Dad, MD as Consulting Physician (Oncology) Katherine Mantle, [place] as Referring Physician (Optometry)   Assessment:   This is a routine wellness examination for Zya[place].  Exercise Activities and Dietary recommendations Current Exercise Habits: The patient does not participate in regular exercise at present, Exercise limited by: None identified  Goals    . Increase physical activity     Starting 03/28/2018, I will  continue to exercise for at least 60 minutes 4 days per week.     . Patient Stated     03/30/2019, I will start back exercising lifting weights and running on treadmill.        Fall Risk Fall Risk  03/30/2019 03/28/2018 06/26/2017 03/26/2017 09/20/2016  Falls in the past year? 0 0 No No Yes  Number falls in past yr: 0 - - - 1  Injury with Fall? 0 - - - Yes  Risk for fall due to : Medication side effect - - - -  Follow up Falls evaluation completed;Falls prevention discussed - - - -   Is the patient's home free of loose throw rugs in walkways, pet beds, electrical cords, etc?   yes      Grab bars in the bathroom? yes      Handrails on the stairs?   yes      Adequate lighting?   yes  Timed Get Up and Go Performed: N/A  Depression Screen PHQ 2/9 Scores 03/30/2019 03/28/2018 06/26/2017 03/26/2017  PHQ - 2 Score 0 0 0 0  PHQ- 9 Score 0 0 - 0    Cognitive Function MMSE - Mini Mental State Exam 03/30/2019 03/28/2018 03/26/2017 09/06/2015  Orientation to time 5 5 5 5   Orientation to Place 5 5 5 5   Registration 3 3 3 3   Attention/ Calculation 5 0 0 0  Recall 3 3 3 3   Language- name 2 objects - 0 0 0  Language- repeat 1 1 1 1   Language- follow 3 step command - 3 3 3   Language- read & follow direction - 0 0 0  Write a sentence - 0 0 0  Copy design - 0 0 0  Total score - 20 20 20   Mini Cog  Mini-Cog screen was completed. Maximum score is 22. A value of 0 denotes this part of the MMSE was not completed or the patient failed this part of the Mini-Cog screening.       Immunization History  Administered Date(s) Administered  . Fluad Quad(high Dose 65+) 11/13/2018  . Influenza Whole 01/10/2007, 12/09/2007, 12/14/2008  . Influenza, High Dose Seasonal PF 12/11/2013, 12/17/2014, 12/02/2015, 12/02/2015,  12/04/2017  . Influenza-Unspecified 12/10/2012, 12/10/2016  . Pneumococcal Conjugate-13 08/11/2013  . Pneumococcal Polysaccharide-23 03/26/2007  . Td 03/26/2007  . Zoster 12/03/2007     Qualifies for Shingles Vaccine? Yes  Screening Tests Health Maintenance  Topic Date Due  . TETANUS/TDAP  03/11/2020 (Originally 03/25/2017)  . FOOT EXAM  04/02/2019  . HEMOGLOBIN A1C  04/22/2019  . OPHTHALMOLOGY EXAM  08/11/2019  . INFLUENZA VACCINE  Completed  . PNA vac Low Risk Adult  Completed   Cancer Screenings: Lung: Low Dose CT Chest recommended if Age 46-80 years, 30 pack-year currently smoking OR have quit w/in 15 years. Patient does not qualify. Colorectal: completed 02/13/2018  Additional Screenings:  Hepatitis C Screening: N/A      Plan:    Patient will start back exercising lifting weights and running on treadmill.   I have personally reviewed and noted the following in the patient's chart:   . Medical and social history . Use of alcohol, tobacco or illicit drugs  . Current medications and supplements . Functional ability and status . Nutritional status . Physical activity . Advanced directives . List of other physicians . Hospitalizations, surgeries, and ER visits in previous 12 months . Vitals . Screenings to include cognitive, depression, and falls . Referrals and appointments  In addition, I have reviewed and discussed with patient certain preventive protocols, quality metrics, and best practice recommendations. A written personalized care plan for preventive services as well as general preventive health recommendations were provided to patient.     Andrez Grime, LPN  06/10/2033

## 2019-03-30 NOTE — Telephone Encounter (Signed)
-----   Message from Ellamae Sia sent at 03/24/2019  2:35 PM EST ----- Regarding: Lab orders for Monday, 1.18.21 Patient is scheduled for CPX labs, please order future labs, Thanks , Karna Christmas

## 2019-03-30 NOTE — Patient Instructions (Signed)
Alexander Yang , Thank you for taking time to come for your Medicare Wellness Visit. I appreciate your ongoing commitment to your health goals. Please review the following plan we discussed and let me know if I can assist you in the future.   Screening recommendations/referrals: Colonoscopy: Up to date, completed 02/13/2018 Recommended yearly ophthalmology/optometry visit for glaucoma screening and checkup Recommended yearly dental visit for hygiene and checkup  Vaccinations: Influenza vaccine: Up to date, completed 11/13/2018 Pneumococcal vaccine: Completed series Tdap vaccine: decline Shingles vaccine: discussed    Advanced directives: Please bring a copy of your POA (Power of Attorney) and/or Living Will to your next appointment.   Conditions/risks identified: diabetes, hypertension, hyperlipidemia  Next appointment: 04/07/2019 @ 12 pm   Preventive Care 65 Years and Older, Male Preventive care refers to lifestyle choices and visits with your health care provider that can promote health and wellness. What does preventive care include?  A yearly physical exam. This is also called an annual well check.  Dental exams once or twice a year.  Routine eye exams. Ask your health care provider how often you should have your eyes checked.  Personal lifestyle choices, including:  Daily care of your teeth and gums.  Regular physical activity.  Eating a healthy diet.  Avoiding tobacco and drug use.  Limiting alcohol use.  Practicing safe sex.  Taking low doses of aspirin every day.  Taking vitamin and mineral supplements as recommended by your health care provider. What happens during an annual well check? The services and screenings done by your health care provider during your annual well check will depend on your age, overall health, lifestyle risk factors, and family history of disease. Counseling  Your health care provider may ask you questions about your:  Alcohol  use.  Tobacco use.  Drug use.  Emotional well-being.  Home and relationship well-being.  Sexual activity.  Eating habits.  History of falls.  Memory and ability to understand (cognition).  Work and work Statistician. Screening  You may have the following tests or measurements:  Height, weight, and BMI.  Blood pressure.  Lipid and cholesterol levels. These may be checked every 5 years, or more frequently if you are over 64 years old.  Skin check.  Lung cancer screening. You may have this screening every year starting at age 7 if you have a 30-pack-year history of smoking and currently smoke or have quit within the past 15 years.  Fecal occult blood test (FOBT) of the stool. You may have this test every year starting at age 96.  Flexible sigmoidoscopy or colonoscopy. You may have a sigmoidoscopy every 5 years or a colonoscopy every 10 years starting at age 68.  Prostate cancer screening. Recommendations will vary depending on your family history and other risks.  Hepatitis C blood test.  Hepatitis B blood test.  Sexually transmitted disease (STD) testing.  Diabetes screening. This is done by checking your blood sugar (glucose) after you have not eaten for a while (fasting). You may have this done every 1-3 years.  Abdominal aortic aneurysm (AAA) screening. You may need this if you are a current or former smoker.  Osteoporosis. You may be screened starting at age 64 if you are at high risk. Talk with your health care provider about your test results, treatment options, and if necessary, the need for more tests. Vaccines  Your health care provider may recommend certain vaccines, such as:  Influenza vaccine. This is recommended every year.  Tetanus, diphtheria, and  acellular pertussis (Tdap, Td) vaccine. You may need a Td booster every 10 years.  Zoster vaccine. You may need this after age 72.  Pneumococcal 13-valent conjugate (PCV13) vaccine. One dose is  recommended after age 48.  Pneumococcal polysaccharide (PPSV23) vaccine. One dose is recommended after age 17. Talk to your health care provider about which screenings and vaccines you need and how often you need them. This information is not intended to replace advice given to you by your health care provider. Make sure you discuss any questions you have with your health care provider. Document Released: 03/25/2015 Document Revised: 11/16/2015 Document Reviewed: 12/28/2014 Elsevier Interactive Patient Education  2017 [place] Prevention in the Home Falls can cause injuries. They can happen to people of all ages. There are many things you can do to make your home safe and to help prevent falls. What can I do on the outside of my home?  Regularly fix the edges of walkways and driveways and fix any cracks.  Remove anything that might make you trip as you walk through a door, such as a raised step or threshold.  Trim any bushes or trees on the path to your home.  Use bright outdoor lighting.  Clear any walking paths of anything that might make someone trip, such as rocks or tools.  Regularly check to see if handrails are loose or broken. Make sure that both sides of any steps have handrails.  Any raised decks and porches should have guardrails on the edges.  Have any leaves, snow, or ice cleared regularly.  Use sand or salt on walking paths during winter.  Clean up any spills in your garage right away. This includes oil or grease spills. What can I do in the bathroom?  Use night lights.  Install grab bars by the toilet and in the tub and shower. Do not use towel bars as grab bars.  Use non-skid mats or decals in the tub or shower.  If you need to sit down in the shower, use a plastic, non-slip stool.  Keep the floor dry. Clean up any water that spills on the floor as soon as it happens.  Remove soap buildup in the tub or shower regularly.  Attach bath mats  securely with double-sided non-slip rug tape.  Do not have throw rugs and other things on the floor that can make you trip. What can I do in the bedroom?  Use night lights.  Make sure that you have a light by your bed that is easy to reach.  Do not use any sheets or blankets that are too big for your bed. They should not hang down onto the floor.  Have a firm chair that has side arms. You can use this for support while you get dressed.  Do not have throw rugs and other things on the floor that can make you trip. What can I do in the kitchen?  Clean up any spills right away.  Avoid walking on wet floors.  Keep items that you use a lot in easy-to-reach places.  If you need to reach something above you, use a strong step stool that has a grab bar.  Keep electrical cords out of the way.  Do not use floor polish or wax that makes floors slippery. If you must use wax, use non-skid floor wax.  Do not have throw rugs and other things on the floor that can make you trip. What can I do with my stairs?  Do not leave any items on the stairs.  Make sure that there are handrails on both sides of the stairs and use them. Fix handrails that are broken or loose. Make sure that handrails are as long as the stairways.  Check any carpeting to make sure that it is firmly attached to the stairs. Fix any carpet that is loose or worn.  Avoid having throw rugs at the top or bottom of the stairs. If you do have throw rugs, attach them to the floor with carpet tape.  Make sure that you have a light switch at the top of the stairs and the bottom of the stairs. If you do not have them, ask someone to add them for you. What else can I do to help prevent falls?  Wear shoes that:  Do not have high heels.  Have rubber bottoms.  Are comfortable and fit you well.  Are closed at the toe. Do not wear sandals.  If you use a stepladder:  Make sure that it is fully opened. Do not climb a closed  stepladder.  Make sure that both sides of the stepladder are locked into place.  Ask someone to hold it for you, if possible.  Clearly mark and make sure that you can see:  Any grab bars or handrails.  First and last steps.  Where the edge of each step is.  Use tools that help you move around (mobility aids) if they are needed. These include:  Canes.  Walkers.  Scooters.  Crutches.  Turn on the lights when you go into a dark area. Replace any light bulbs as soon as they burn out.  Set up your furniture so you have a clear path. Avoid moving your furniture around.  If any of your floors are uneven, fix them.  If there are any pets around you, be aware of where they are.  Review your medicines with your doctor. Some medicines can make you feel dizzy. This can increase your chance of falling. Ask your doctor what other things that you can do to help prevent falls. This information is not intended to replace advice given to you by your health care provider. Make sure you discuss any questions you have with your health care provider. Document Released: 12/23/2008 Document Revised: 08/04/2015 Document Reviewed: 04/02/2014 Elsevier Interactive Patient Education  2017 [employer].

## 2019-03-30 NOTE — Progress Notes (Signed)
PCP notes:  Health Maintenance: No gaps noted    Abnormal Screenings: none   Patient concerns: Patient is concerned about his ongoing elevated glucose readings.    Nurse concerns: none   Next PCP appt: 04/07/2019 @ 12 pm

## 2019-03-31 NOTE — Progress Notes (Signed)
No critical labs need to be addressed urgently. We will discuss labs in detail at upcoming office visit.   

## 2019-04-02 ENCOUNTER — Ambulatory Visit: Payer: Medicare Other

## 2019-04-03 ENCOUNTER — Ambulatory Visit: Payer: 59 | Attending: Internal Medicine

## 2019-04-03 DIAGNOSIS — Z23 Encounter for immunization: Secondary | ICD-10-CM | POA: Insufficient documentation

## 2019-04-03 NOTE — Progress Notes (Signed)
   Covid-19 Vaccination [facility]  Name:  Alexander Yang    MRN: [id number] DOB: 1940-12-05  04/03/2019  Alexander Yang was observed post Covid-19 immunization for 15 minutes without incidence. He was provided with Vaccine Information Sheet and instruction to access the V-Safe system.   Alexander Yang was instructed to call 911 with any severe reactions post vaccine: Marland Kitchen Difficulty breathing  . Swelling of your face and throat  . A fast heartbeat  . A bad rash all over your body  . Dizziness and weakness    Immunizations Administered    Name Date Dose VIS Date Route   Pfizer COVID-19 Vaccine 04/03/2019  8:31 AM 0.3 mL 02/20/2019 Intramuscular   Manufacturer: [place]   Lot: [id number]   [place]: [id number]

## 2019-04-07 ENCOUNTER — Other Ambulatory Visit: Payer: Self-pay

## 2019-04-07 ENCOUNTER — Encounter: Payer: Self-pay | Admitting: Family Medicine

## 2019-04-07 ENCOUNTER — Ambulatory Visit (INDEPENDENT_AMBULATORY_CARE_PROVIDER_SITE_OTHER): Payer: Medicare PPO | Admitting: Family Medicine

## 2019-04-07 VITALS — BP 140/80 | HR 75 | Temp 97.7°F | Ht 71.0 in | Wt 182.5 lb

## 2019-04-07 DIAGNOSIS — E785 Hyperlipidemia, unspecified: Secondary | ICD-10-CM

## 2019-04-07 DIAGNOSIS — I10 Essential (primary) hypertension: Secondary | ICD-10-CM

## 2019-04-07 DIAGNOSIS — E1142 Type 2 diabetes mellitus with diabetic polyneuropathy: Secondary | ICD-10-CM | POA: Diagnosis not present

## 2019-04-07 DIAGNOSIS — Z0000 Encounter for general adult medical examination without abnormal findings: Secondary | ICD-10-CM

## 2019-04-07 DIAGNOSIS — E0840 Diabetes mellitus due to underlying condition with diabetic neuropathy, unspecified: Secondary | ICD-10-CM | POA: Diagnosis not present

## 2019-04-07 DIAGNOSIS — E1159 Type 2 diabetes mellitus with other circulatory complications: Secondary | ICD-10-CM

## 2019-04-07 DIAGNOSIS — C61 Malignant neoplasm of prostate: Secondary | ICD-10-CM

## 2019-04-07 DIAGNOSIS — I700 Atherosclerosis of aorta: Secondary | ICD-10-CM

## 2019-04-07 DIAGNOSIS — E1169 Type 2 diabetes mellitus with other specified complication: Secondary | ICD-10-CM

## 2019-04-07 LAB — HM DIABETES FOOT EXAM

## 2019-04-07 MED ORDER — ATORVASTATIN CALCIUM 40 MG PO TABS: 40.0000 mg | ORAL_TABLET | Freq: Every day | ORAL | 11 refills | Status: DC

## 2019-04-07 NOTE — Patient Instructions (Addendum)
Set up yearly eye exam.  Stop pravastatin. Change to atorvastatin 40 mg daily.  Work on low  Cholesterol low carb diet. Look into Shingrix vaccine series coverage once COVID 19 vaccine series completed.

## 2019-04-07 NOTE — Progress Notes (Signed)
Chief Complaint  Patient presents with  . Annual Exam    Part 2    History of Present Illness: HPI  The patient presents for  complete physical and review of chronic health problems. He/She also has the following acute concerns today:  Insomnia, but not terrible per pt Wakes up refreshed.. still getting 7-8 hours a night.  At night her wakes up every 2 hours  With burning in feet, hot flashes Does not want to take a med to treat.  The patient saw a LPN or RN for medicare wellness visit.  Prevention and wellness was reviewed in detail. Note reviewed and important notes copied below. Abnormal Screenings: none Patient concerns: Patient is concerned about his ongoing elevated glucose readings.   04/07/19  Diabetes:  Not at goal on farxiga and metformin.  Both at  max recommended He has been more thirsty.  SE, severe to trulicty in past Lab Results  Component Value Date   HGBA1C 7.7 (H) 03/30/2019  Using medications without difficulties: Hypoglycemic episodes:none Hyperglycemic episodes:none Feet problems: neuropathy Blood Sugars averaging: 155 average eye exam within last year: due  Hypertension:   Tolerable control given age on losartan   BP Readings from Last 3 Encounters:  04/07/19 140/80  03/30/19 127/73  02/03/19 (!) 146/78  Using medication without problems or lightheadedness:  none Chest pain with exertion:none Edema:none Short of breath:none Average home BPs: Other issues:  Elevated Cholesterol:  LDL not at goal on pravastatin, aortic atherosclerosis/TIA Lab Results  Component Value Date   CHOL 165 03/30/2019   HDL 51.00 03/30/2019   LDLCALC 102 (H) 03/30/2019   TRIG 60.0 03/30/2019   CHOLHDL 3 03/30/2019  Using medications without problems:none Muscle aches: none Diet compliance: moderate Exercise: treadmilll 3 times a week and starting some strengthening. Other complaints:  Prostate cancer followed by uro now on eligard every 3 months   This  visit occurred during the SARS-CoV-2 public health emergency.  Safety protocols were in place, including screening questions prior to the visit, additional usage of staff PPE, and extensive cleaning of exam room while observing appropriate contact time as indicated for disinfecting solutions.   COVID 19 screen:  No recent travel or known exposure to COVID19 The patient denies respiratory symptoms of COVID 19 at this time. The importance of social distancing was discussed today.     Review of Systems  Constitutional: Negative for chills and fever.  HENT: Negative for congestion and ear pain.   Eyes: Negative for pain and redness.  Respiratory: Negative for cough and shortness of breath.   Cardiovascular: Negative for chest pain, palpitations and leg swelling.  Gastrointestinal: Negative for abdominal pain, blood in stool, constipation, diarrhea, nausea and vomiting.  Genitourinary: Negative for dysuria.  Musculoskeletal: Negative for falls and myalgias.  Skin: Negative for rash.  Neurological: Negative for dizziness.  Psychiatric/Behavioral: Negative for depression. The patient is not nervous/anxious.       Past Medical History:  Diagnosis Date  . Cerebrovascular accident ([place])   . Diabetes mellitus without complication ([place])    controlled with diet  . Hypertension   . [place] ([facility]) Stroke Scale dysarthria score 1, mild to moderate dysarthria, patient slurs at least some words and, at worst, can be understood with some difficulty 1980  . Osteoporosis   . Prostate cancer [facility])     reports that he quit smoking about 54 years ago. His smoking use included cigarettes. He has a 20.00 pack-year smoking history. He has  never used smokeless tobacco. He reports current alcohol use of about 7.0 standard drinks of alcohol per week. He reports that he does not use drugs.   Current Outpatient Medications:  .  aspirin 81 MG tablet, Take 81 mg by mouth every other day., Disp:  , Rfl:  .  Blood Glucose Monitoring Suppl (ACCU-CHEK AVIVA PLUS) w/Device KIT, Use to check blood sugar 2 times a day.  Dx: E11.49, Disp: 1 kit, Rfl: 0 .  CALCIUM-VITAMIN D PO, Take 1 tablet by mouth daily., Disp: , Rfl:  .  dapagliflozin propanediol (FARXIGA) 10 MG TABS tablet, Take 10 mg by mouth daily before breakfast., Disp: 30 tablet, Rfl: 11 .  glucose blood (ACCU-CHEK AVIVA) test strip, Use to check blood sugar 2 times a day.  Dx: E11.49, Disp: 100 each, Rfl: 11 .  ketoconazole (NIZORAL) 2 % cream, Apply 1 application topically daily as needed for irritation., Disp: , Rfl:  .  Lancets (ACCU-CHEK SOFT TOUCH) lancets, Use to check blood sugar 2 times a day.  Dx: E11.49, Disp: 100 each, Rfl: 11 .  Lancets Misc. (ACCU-CHEK SOFTCLIX LANCET DEV) KIT, Use to check blood sugar 2 times a day.  Dx: E11.49, Disp: 1 kit, Rfl: 0 .  Leuprolide Acetate, 3 Month, (ELIGARD) 22.5 MG injection, Inject 22.5 mg into the skin every 3 (three) months., Disp: , Rfl:  .  losartan (COZAAR) 100 MG tablet, Take 1 tablet (100 mg total) by mouth daily., Disp: 90 tablet, Rfl: 3 .  metFORMIN (GLUCOPHAGE-XR) 500 MG 24 hr tablet, TAKE 2 TABLETS BY MOUTH EVERY DAY WITH BREAKFAST, Disp: 180 tablet, Rfl: 1 .  ondansetron (ZOFRAN) 4 MG tablet, Take 1-2 tablets (4-8 mg total) by mouth every 8 (eight) hours as needed for nausea or vomiting., Disp: 20 tablet, Rfl: 0 .  pravastatin (PRAVACHOL) 40 MG tablet, TAKE 1 TABLET BY MOUTH ONCE DAILY, Disp: 90 tablet, Rfl: 3 .  Psyllium (METAMUCIL FIBER PO), Take 1 capsule by mouth daily., Disp: , Rfl:    Observations/Objective: Blood pressure 140/80, pulse 75, temperature 97.7 F (36.5 C), temperature source Temporal, height 5' 11"  (1.803 m), weight 182 lb 8 oz (82.8 kg), SpO2 98 %.  Physical Exam Constitutional:      Appearance: He is well-developed.  HENT:     Head: Normocephalic.     Right Ear: Hearing normal.     Left Ear: Hearing normal.     Nose: Nose normal.  Neck:      Thyroid: No thyroid mass or thyromegaly.     Vascular: No carotid bruit.     Trachea: Trachea normal.  Cardiovascular:     Rate and Rhythm: Normal rate and regular rhythm.     Pulses: Normal pulses.     Heart sounds: Heart sounds not distant. No murmur. No friction rub. No gallop.      Comments: No peripheral edema Pulmonary:     Effort: Pulmonary effort is normal. No respiratory distress.     Breath sounds: Normal breath sounds.  Skin:    General: Skin is warm and dry.     Findings: No rash.  Psychiatric:        Speech: Speech normal.        Behavior: Behavior normal.        Thought Content: Thought content normal.     Diabetic foot exam: Normal inspection No skin breakdown No calluses  Normal DP pulses Normal sensation to light touch and monofilament Nails normal  Assessment and Plan The  patient's preventative maintenance and recommended screening tests for an annual wellness exam were reviewed in full today. Brought up to date unless services declined.  Counselled on the importance of diet, exercise, and its role in overall health and mortality. The patient's FH and SH was reviewed, including their home life, tobacco status, and drug and alcohol status.   Vaccines: uptodate. Colon:  2019 negative, no further indicated  Prostate cancer: on lupron   Prostate cancer ([place]) Followed by  URO on Eligard.  Diabetes mellitus with neurological manifestations, controlled ([place]) Almost at goal on SGlt21 and metformin.  If worsening consider addition of DPP4 I.  Hyperlipidemia associated with type 2 diabetes mellitus (HCC) Change pravastatin  To atorvastatin to get LDL to goal < 70 given TIA in past. Re-eval in 3 months  Diabetic peripheral neuropathy associated with type 2 diabetes mellitus (HCC) Tolerable control.  Hypertension associated with diabetes ([place]) Well controlled. Continue current medication.     Eliezer Lofts, MD

## 2019-04-07 NOTE — Assessment & Plan Note (Signed)
Followed by  [facility]O on Eligard.

## 2019-04-07 NOTE — Assessment & Plan Note (Signed)
Change pravastatin  To atorvastatin to get LDL to goal < 70 given TIA in past. Re-eval in 3 months

## 2019-04-07 NOTE — Assessment & Plan Note (Signed)
Almost at goal on SGlt21 and metformin.  If worsening consider addition of DPP4 I.

## 2019-04-07 NOTE — Assessment & Plan Note (Signed)
Tolerable control. 

## 2019-04-07 NOTE — Assessment & Plan Note (Signed)
Well controlled. Continue current medication.  

## 2019-04-08 ENCOUNTER — Other Ambulatory Visit: Payer: Self-pay | Admitting: Family Medicine

## 2019-04-11 ENCOUNTER — Other Ambulatory Visit: Payer: Self-pay | Admitting: Family Medicine

## 2019-04-23 ENCOUNTER — Ambulatory Visit: Payer: Medicare PPO | Attending: Internal Medicine

## 2019-04-23 DIAGNOSIS — Z23 Encounter for immunization: Secondary | ICD-10-CM | POA: Insufficient documentation

## 2019-04-23 NOTE — Progress Notes (Signed)
   Covid-19 Vaccination Clinic  Name:  Alexander Yang    MRN: [id number] DOB: 1940/12/09  04/23/2019  [name] was observed post Covid-19 immunization for 15 minutes without incidence. He was provided with Vaccine Information Sheet and instruction to access the V-Safe system.   [name] was instructed to call 911 with any severe reactions post vaccine: Marland Kitchen Difficulty breathing  . Swelling of your face and throat  . A fast heartbeat  . A bad rash all over your body  . Dizziness and weakness    Immunizations Administered    Name Date Dose VIS Date Route   Pfizer COVID-19 Vaccine 04/23/2019  8:54 AM 0.3 mL 02/20/2019 Intramuscular   Manufacturer: [place]   Lot: [id number]   [place]: [id number]

## 2019-05-06 ENCOUNTER — Inpatient Hospital Stay: Payer: Medicare PPO | Attending: Oncology | Admitting: Oncology

## 2019-05-06 ENCOUNTER — Inpatient Hospital Stay: Payer: Medicare PPO

## 2019-05-06 ENCOUNTER — Other Ambulatory Visit: Payer: Self-pay

## 2019-05-06 VITALS — BP 162/79 | HR 88 | Temp 98.5°F | Resp 18 | Wt 186.6 lb

## 2019-05-06 DIAGNOSIS — Z5111 Encounter for antineoplastic chemotherapy: Secondary | ICD-10-CM | POA: Insufficient documentation

## 2019-05-06 DIAGNOSIS — C61 Malignant neoplasm of prostate: Secondary | ICD-10-CM

## 2019-05-06 MED ORDER — LEUPROLIDE ACETATE 7.5 MG ~~LOC~~ KIT
7.5000 mg | PACK | Freq: Once | SUBCUTANEOUS | Status: AC
  Administered 2019-05-06: 7.5 mg via SUBCUTANEOUS
  Filled 2019-05-06: qty 7.5

## 2019-05-06 NOTE — Patient Instructions (Signed)

## 2019-05-06 NOTE — Progress Notes (Signed)
Okay to proceed w/ Eligard injection today from insurance standpoint per Juliann Pulse. PA team is working on [place], PharmD, [facility] Pharmac[facility] Phone: [phone] 05/06/2019

## 2019-05-06 NOTE — Progress Notes (Signed)
Hematology and Oncology Follow Up Visit  Alexander Yang [id number] [birth year] 79 y.o. 05/06/2019 1:30 PM Bedsole, [name], [name], [name], MD   Principle Diagnosis: 79 year old man with prostate cancer diagnosed in 2009 with a Gleason score 6 and a PSA of 3.17.  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     Prior Therapy: Observation and surveillance between 2009 in 2011.  He was found to have a Gleason score 4+4 = 8 in 2011. He received radiation therapy between October 2011 until December 2011.   He received concomitant Taxotere chemotherapy as a part of a clinical trial. He also received 6 months of hormone therapy up until July 2012. His PSA nadir was down to 0.1 In December 2013.   His PSA continue to be low close to 0.1 and 0.2 Up till December 2015.   In June 2016 was up to 0.4 and his most recent PSA in September 2016 was up to 0.7.    Current therapy:   Androgen deprivation therapy with Lupron at 7.5 mg on a monthly basis started in March 2018.  Last injection will be given on January 08, 2017 and has been on hold since that time.   Lupron 7.5 mg monthly given intermittently.  Last injection was given November 2020 in the form of Eligard.   Interim History:  Alexander Yang is here for repeat evaluation.  Since the last visit, he reports no major changes in his health.  He continues to enjoy excellent quality of life without any recent hospitalizations or illnesses.  He denies any bone pain or pathological fractures.  He denies changes in his performance status or activity level.  He has tolerated the last Eligard injection without any major issues.  He does report some occasional sleep disturbances without any urinary symptoms.  He denies any frequency urgency or hesitancy.                 Medications: Updated without any changes. Current Outpatient Medications  Medication Sig Dispense  Refill  . aspirin 81 MG tablet Take 81 mg by mouth every other day.    Marland Kitchen atorvastatin (LIPITOR) 40 MG tablet Take 1 tablet (40 mg total) by mouth daily. 30 tablet 11  . Blood Glucose Monitoring Suppl (ACCU-CHEK AVIVA PLUS) w/Device KIT Use to check blood sugar 2 times a day.  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 CALCIUM-VITAMIN D PO Take 1 tablet by mouth daily.    . dapagliflozin propanediol (FARXIGA) 10 MG TABS tablet Take 10 mg by mouth daily before breakfast. 30 tablet 11  . glucose blood (ACCU-CHEK AVIVA) test strip Use to check blood sugar 2 times a day.  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 ketoconazole (NIZORAL) 2 % cream Apply 1 application topically daily as needed for irritation.    . Lancets (ACCU-CHEK SOFT TOUCH) lancets Use to check blood sugar 2 times a day.  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 Lancets Misc. (ACCU-CHEK SOFTCLIX LANCET DEV) KIT Use to check blood sugar 2 times a day.  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 Leuprolide Acetate, 3 Month, (ELIGARD) 22.5 MG injection Inject 22.5 mg into the skin every 3 (three) months.    Marland Kitchen losartan (COZAAR) 100 MG tablet Take 1 tablet (100 mg total) by mouth daily. 90 tablet 3  . metFORMIN (GLUCOPHAGE-XR) 500 MG 24 hr tablet TAKE 2 TABLETS BY MOUTH EVERY DAY WITH BREAKFAST 180 tablet 3  . ondansetron (  ZOFRAN) 4 MG tablet Take 1-2 tablets (4-8 mg total) by mouth every 8 (eight) hours as needed for nausea or vomiting. 20 tablet 0  . Psyllium (METAMUCIL FIBER PO) Take 1 capsule by mouth daily.     No current facility-administered medications for this visit.     Allergies: No Known Allergies      Physical exam:   Blood pressure (!) 162/79, pulse 88, temperature 98.5 F (36.9 C), temperature source Temporal, resp. rate 18, weight 186 lb 9.6 oz (84.6 kg), SpO2 98 %.     ECOG 0.      General appearance: Alert, awake without any distress. Head: Atraumatic without abnormalities Oropharynx: Without any thrush or ulcers. Eyes: No scleral icterus. Lymph nodes: No  lymphadenopathy noted in the cervical, supraclavicular, or axillary nodes Heart:regular rate and rhythm, without any murmurs or gallops.   Lung: Clear to auscultation without any rhonchi, wheezes or dullness to percussion. Abdomin: Soft, nontender without any shifting dullness or ascites. Musculoskeletal: No clubbing or cyanosis. Neurological: No motor or sensory deficits. Skin: No rashes or lesions.      PSA on 04/20/2015 was 2.6.  PSA on 07/19/2015 was 3.7. PSA on 10/18/2015 was 3.2. PSA on 01/11/2016 was 4.4. PSA on 04/12/2016 was 8.2. Lupron started in March 2018. PSA on 08/23/2016 was 0.7 PSA in August 2018 was 0.7. PSA in 12/2016 0.7.  Testosterone was less than 3. PSA on April 03, 2017 was 1.5. PSA on July 02, 2017 was 5.7. PSA on 10/02/2017 was 13.7 PSA 01/02/3018 3.9 PSA on April 03, 2018 was 3.4. PSA on April 22 of 2020 was 3.0. PSA on October 24, 2018 was 2.6 PSA on January 26, 2019 was 8.5. PSA on 04/29/2019 8.3      Impression and Plan:   79 year old man with  1.  Prostate cancer diagnosed in 2009 and subsequently developed castration-sensitive with biochemical relapse in 2018.      He continues to receive intermittent androgen deprivation therapy and has been receiving lower doses of Lupron for better tolerance based on his preferences.  His PSA in February 17 of 2021 was 8.3 which is stable from previous count.  Risks and benefits of continuing androgen deprivation on a monthly basis was reviewed.  Higher doses of Eligard with every 3 to 4 months formulation was also discussed.  He has experienced higher toxicities with higher doses of androgen deprivation and more frequent dosing and prefer to avoid that.  The plan is to proceed with Eligard at 7.5 mg and repeat in 3 months.  We will also obtain a repeat PSA in 3 months in addition to staging work-up including Axumin PET.  2. Sleep disturbances: Unrelated to his prostate cancer or urinary symptoms.  3.   Covid vaccination: He is up-to-date and complete the series.  4. Follow-up: He will return in 3 months for repeat evaluation.  30  minutes were dedicated to this encounter.  The time was spent on reviewing his disease status, reviewing laboratory data and given treatment options and future plan of care discussion.  Zola Button, MD 2/24/20211:30 PM

## 2019-05-07 ENCOUNTER — Telehealth: Payer: Self-pay | Admitting: Oncology

## 2019-05-07 NOTE — Telephone Encounter (Signed)
Scheduled appt per 2/24 los.  Sent a message to HIM pool to get a calendar mailed out.

## 2019-05-12 MED ORDER — TRAZODONE HCL 50 MG PO TABS: 25.0000 mg | ORAL_TABLET | Freq: Every evening | ORAL | 3 refills | Status: DC | PRN

## 2019-05-13 ENCOUNTER — Other Ambulatory Visit: Payer: Self-pay | Admitting: Family Medicine

## 2019-05-18 DIAGNOSIS — Z79899 Other long term (current) drug therapy: Secondary | ICD-10-CM

## 2019-05-27 IMAGING — PT NM PET NOPR SKULL BASE TO THIGH
1 of 8 series · 1 of 25 positions shown · non-contrast
Comparison: None.

CLINICAL DATA: Prostate carcinoma with biochemical recurrence.
Rising PSA equal 5.7.

EXAM:
NUCLEAR MEDICINE PET SKULL BASE TO THIGH
TECHNIQUE: 9.3 mCi F-18 Fluciclovine was injected intravenously. Full-ring PET
imaging was performed from the skull base to thigh after the
radiotracer. CT data was obtained and used for attenuation
correction and anatomic localization.

[Series 2: ct sk_thigh 5.0 b31f · axial · 5.0mm · 0.98mm/px · 1 of 236 slices shown]
[im 236/236  brain]
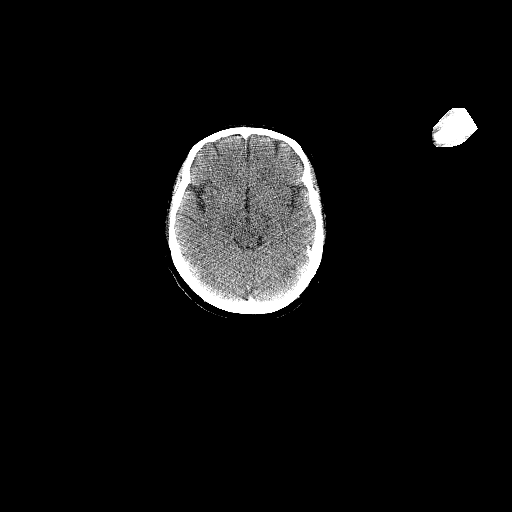

[1 of 25 positions shown; findings below may reference images not displayed]

FINDINGS: NECK

No radiotracer activity in neck lymph nodes.

Incidental CT finding: None

CHEST

No radiotracer accumulation within mediastinal or hilar lymph nodes.
No suspicious pulmonary nodules on the CT scan.

Incidental CT finding: Small 2 mm nodule in the RIGHT lower lobe
(image 34/80 is unchanged

ABDOMEN/PELVIS

Prostate: Intense focal activity localizing within the mid RIGHT
lateral prostate gland is increased from comparison exam with SUV
max equal 7.4 compared SUV max equal 4.6.

Lymph nodes: No radiotracer accumulation within pelvic lymph nodes.
Small 5 mm LEFT external iliac lymph node (image 172/2 is unchanged.
No periaortic aortic adenopathy.

Liver: No evidence of liver metastasis

Incidental CT finding: None

SKELETON

No focal  activity to suggest skeletal metastasis.
IMPRESSION: 1. Intense focal activity within the RIGHT lateral mid prostate
gland is concerning for residual/recurrent prostate carcinoma.
Lesion redemonstrated from comparison exam with increase intensity.
2. No evidence of metastatic adenopathy in the abdomen pelvis.
3. No evidence of distant metastatic disease or skeletal disease.

## 2019-06-02 ENCOUNTER — Encounter: Payer: Self-pay | Admitting: Oncology

## 2019-06-17 ENCOUNTER — Encounter: Payer: Self-pay | Admitting: Oncology

## 2019-06-30 ENCOUNTER — Telehealth: Payer: Self-pay | Admitting: Family Medicine

## 2019-06-30 ENCOUNTER — Other Ambulatory Visit (INDEPENDENT_AMBULATORY_CARE_PROVIDER_SITE_OTHER): Payer: Medicare PPO

## 2019-06-30 DIAGNOSIS — E0840 Diabetes mellitus due to underlying condition with diabetic neuropathy, unspecified: Secondary | ICD-10-CM | POA: Diagnosis not present

## 2019-06-30 LAB — LIPID PANEL
Cholesterol: 200 mg/dL (ref 0–200)
HDL: 45.7 mg/dL (ref >39.00)
LDL Cholesterol: 133 mg/dL — ABNORMAL HIGH (ref 0–99)
NonHDL: 154.66
Total CHOL/HDL Ratio: 4
Triglycerides: 108 mg/dL (ref 0.0–149.0)
VLDL: 21.6 mg/dL (ref 0.0–40.0)

## 2019-06-30 LAB — COMPREHENSIVE METABOLIC PANEL
ALT: 15 U/L (ref 0–53)
AST: 13 U/L (ref 0–37)
Albumin: 4.3 g/dL (ref 3.5–5.2)
Alkaline Phosphatase: 45 U/L (ref 39–117)
BUN: 16 mg/dL (ref 6–23)
CO2: 29 mEq/L (ref 19–32)
Calcium: 9.3 mg/dL (ref 8.4–10.5)
Chloride: 104 mEq/L (ref 96–112)
Creatinine, Ser: 0.81 mg/dL (ref 0.40–1.50)
GFR: 92.07 mL/min (ref >60.00)
Glucose, Bld: 170 mg/dL — ABNORMAL HIGH (ref 70–99)
Potassium: 4.2 mEq/L (ref 3.5–5.1)
Sodium: 139 mEq/L (ref 135–145)
Total Bilirubin: 0.9 mg/dL (ref 0.2–1.2)
Total Protein: 6.6 g/dL (ref 6.0–8.3)

## 2019-06-30 LAB — HEMOGLOBIN A1C: Hgb A1c MFr Bld: 7.3 % — ABNORMAL HIGH (ref 4.6–6.5)

## 2019-06-30 NOTE — Progress Notes (Signed)
No critical labs need to be addressed urgently. We will discuss labs in detail at upcoming office visit.   

## 2019-06-30 NOTE — Telephone Encounter (Signed)
-----   Message from Ellamae Sia sent at 06/16/2019  2:59 PM EDT ----- Regarding: Lab orders for Tuesday, 4.20.21 Lab orders for a 3 month follow up appt.

## 2019-07-04 ENCOUNTER — Encounter: Payer: Self-pay | Admitting: Oncology

## 2019-07-07 ENCOUNTER — Other Ambulatory Visit: Payer: Self-pay

## 2019-07-07 ENCOUNTER — Ambulatory Visit: Payer: Medicare PPO | Admitting: Family Medicine

## 2019-07-07 ENCOUNTER — Telehealth: Payer: Self-pay

## 2019-07-07 ENCOUNTER — Encounter: Payer: Self-pay | Admitting: Family Medicine

## 2019-07-07 VITALS — BP 128/80 | HR 84 | Temp 97.3°F | Ht 71.0 in | Wt 180.8 lb

## 2019-07-07 DIAGNOSIS — I10 Essential (primary) hypertension: Secondary | ICD-10-CM

## 2019-07-07 DIAGNOSIS — C61 Malignant neoplasm of prostate: Secondary | ICD-10-CM | POA: Diagnosis not present

## 2019-07-07 DIAGNOSIS — E1142 Type 2 diabetes mellitus with diabetic polyneuropathy: Secondary | ICD-10-CM | POA: Diagnosis not present

## 2019-07-07 DIAGNOSIS — E785 Hyperlipidemia, unspecified: Secondary | ICD-10-CM | POA: Diagnosis not present

## 2019-07-07 DIAGNOSIS — E1169 Type 2 diabetes mellitus with other specified complication: Secondary | ICD-10-CM

## 2019-07-07 DIAGNOSIS — E1159 Type 2 diabetes mellitus with other circulatory complications: Secondary | ICD-10-CM | POA: Diagnosis not present

## 2019-07-07 DIAGNOSIS — E0840 Diabetes mellitus due to underlying condition with diabetic neuropathy, unspecified: Secondary | ICD-10-CM | POA: Diagnosis not present

## 2019-07-07 DIAGNOSIS — F5104 Psychophysiologic insomnia: Secondary | ICD-10-CM | POA: Diagnosis not present

## 2019-07-07 NOTE — Progress Notes (Signed)
Chief Complaint  Patient presents with  . 3 mos FU    History of Present Illness: HPI  79 year old male presents for follow up DM.  Diabetes:   Improving control on farxiga, metfomrin.  Had a lot of nausea gas with higher dose of farxiga so now back to 5 mg daily  He feels it is some better but still  Few days a week he is nauseous. Lab Results  Component Value Date   HGBA1C 7.3 (H) 06/30/2019  Using medications without difficulties: Hypoglycemic episodes:none Hyperglycemic episodes:none Feet problems: no ulcers Blood Sugars averaging: FBS 150-165 eye exam within last year: yes  Elevated Cholesterol: At last OV changed to atorvastatin 40 mg daily from pravastatin  Goal LDL  >70 given aortic atherosclerosis Lab Results  Component Value Date   CHOL 200 06/30/2019   HDL 45.70 06/30/2019   LDLCALC 133 (H) 06/30/2019   TRIG 108.0 06/30/2019   CHOLHDL 4 06/30/2019  Using medications without problems: Had SE of gas and bloating .. so stopped i1 one month ago. Muscle aches:  Diet compliance:moderate Exercise: minimal Other complaints:  Hypertension:   At goal on losartan BP Readings from Last 3 Encounters:  07/07/19 128/80  05/06/19 (!) 162/79  04/07/19 140/80  Using medication without problems or lightheadedness: none Chest pain with exertion:none Edema:none Short of breath:none Average home BPs: Other issues:  Chronic insomnia: Now on trazodone every other day... this seems to help some with sleep. No problem going to sleep but wakes up 3 times a night < 2 times a night. Hot flashes are waking him up from his  Prostate med.   prostate cancer bone density pending  This visit occurred during the SARS-CoV-2 public health emergency.  Safety protocols were in place, including screening questions prior to the visit, additional usage of staff PPE, and extensive cleaning of exam room while observing appropriate contact time as indicated for disinfecting solutions.    COVID 19 screen:  No recent travel or known exposure to COVID19 The patient denies respiratory symptoms of COVID 19 at this time. The importance of social distancing was discussed today.     Review of Systems  Constitutional: Negative for chills and fever.  HENT: Negative for congestion and ear pain.   Eyes: Negative for pain and redness.  Respiratory: Negative for cough and shortness of breath.   Cardiovascular: Negative for chest pain, palpitations and leg swelling.  Gastrointestinal: Negative for abdominal pain, blood in stool, constipation, diarrhea, nausea and vomiting.  Genitourinary: Negative for dysuria.  Musculoskeletal: Negative for falls and myalgias.  Skin: Negative for rash.  Neurological: Negative for dizziness.  Psychiatric/Behavioral: Negative for depression. The patient is not nervous/anxious.       Past Medical History:  Diagnosis Date  . Cerebrovascular accident ([place])   . Diabetes mellitus without complication ([place])    controlled with diet  . Hypertension   . [place] ([facility]) Stroke Scale dysarthria score 1, mild to moderate dysarthria, patient slurs at least some words and, at worst, can be understood with some difficulty 1980  . Osteoporosis   . Prostate cancer [facility])     reports that he quit smoking about 54 years ago. His smoking use included cigarettes. He has a 20.00 pack-year smoking history. He has never used smokeless tobacco. He reports current alcohol use of about 7.0 standard drinks of alcohol per week. He reports that he does not use drugs.   Current Outpatient Medications:  .  aspirin 81  MG tablet, Take 81 mg by mouth every other day., Disp: , Rfl:  .  Blood Glucose Monitoring Suppl (ACCU-CHEK AVIVA PLUS) w/Device KIT, Use to check blood sugar 2 times a day.  Dx: E11.49, Disp: 1 kit, Rfl: 0 .  CALCIUM-VITAMIN D PO, Take 1 tablet by mouth daily., Disp: , Rfl:  .  dapagliflozin propanediol (FARXIGA) 10 MG TABS tablet, Take 10 mg  by mouth daily before breakfast. (Patient taking differently: Take 5 mg by mouth daily before breakfast. ), Disp: 30 tablet, Rfl: 11 .  glucose blood (ACCU-CHEK AVIVA) test strip, Use to check blood sugar 2 times a day.  Dx: E11.49, Disp: 100 each, Rfl: 11 .  ketoconazole (NIZORAL) 2 % cream, Apply 1 application topically daily as needed for irritation., Disp: , Rfl:  .  Lancets (ACCU-CHEK SOFT TOUCH) lancets, Use to check blood sugar 2 times a day.  Dx: E11.49, Disp: 100 each, Rfl: 11 .  Lancets Misc. (ACCU-CHEK SOFTCLIX LANCET DEV) KIT, Use to check blood sugar 2 times a day.  Dx: E11.49, Disp: 1 kit, Rfl: 0 .  Leuprolide Acetate, 3 Month, (ELIGARD) 22.5 MG injection, Inject 22.5 mg into the skin every 3 (three) months., Disp: , Rfl:  .  losartan (COZAAR) 100 MG tablet, Take 1 tablet (100 mg total) by mouth daily., Disp: 90 tablet, Rfl: 3 .  metFORMIN (GLUCOPHAGE-XR) 500 MG 24 hr tablet, TAKE 2 TABLETS BY MOUTH EVERY DAY WITH BREAKFAST, Disp: 180 tablet, Rfl: 3 .  traZODone (DESYREL) 50 MG tablet, Take 0.5-1 tablets (25-50 mg total) by mouth at bedtime as needed for sleep., Disp: 30 tablet, Rfl: 3 .  ondansetron (ZOFRAN) 4 MG tablet, Take 1-2 tablets (4-8 mg total) by mouth every 8 (eight) hours as needed for nausea or vomiting. (Patient not taking: Reported on 07/07/2019), Disp: 20 tablet, Rfl: 0 .  Psyllium (METAMUCIL FIBER PO), Take 1 capsule by mouth daily., Disp: , Rfl:    Observations/Objective: Blood pressure 128/80, pulse 84, temperature (!) 97.3 F (36.3 C), temperature source Temporal, height 5' 11"  (1.803 m), weight 180 lb 12 oz (82 kg), SpO2 97 %.  Physical Exam Constitutional:      Appearance: He is well-developed.  HENT:     Head: Normocephalic.     Right Ear: Hearing normal.     Left Ear: Hearing normal.     Nose: Nose normal.  Neck:     Thyroid: No thyroid mass or thyromegaly.     Vascular: No carotid bruit.     Trachea: Trachea normal.  Cardiovascular:     Rate and  Rhythm: Normal rate and regular rhythm.     Pulses: Normal pulses.     Heart sounds: Heart sounds not distant. No murmur. No friction rub. No gallop.      Comments: No peripheral edema Pulmonary:     Effort: Pulmonary effort is normal. No respiratory distress.     Breath sounds: Normal breath sounds.  Skin:    General: Skin is warm and dry.     Findings: No rash.  Psychiatric:        Speech: Speech normal.        Behavior: Behavior normal.        Thought Content: Thought content normal.    Diabetic foot exam: Normal inspection No skin breakdown No calluses  Normal DP pulses Normal sensation to light touch and monofilament Nails normal   Assessment and Plan   Hyperlipidemia associated with type 2 diabetes mellitus ([place]) Restart  atorvastain 1/2 tab daily.   Diabetes mellitus with neurological manifestations, controlled ([place]) Stay of Farxiga at 5 mg daily.  Diabetic peripheral neuropathy associated with type 2 diabetes mellitus ([facility]) Stable. Due to DM, inadequate control of A1C.  Hypertension associated with diabetes ([place]) Well controlled. Continue current medication.   Chronic insomnia Can try and increased dose of trazodone.Marland Kitchen 1.5 to 2 tabs daily.       Eliezer Lofts, MD

## 2019-07-07 NOTE — Patient Instructions (Addendum)
Restart atorvastain 1/2 tab daily.  Stay of Farxiga at 5 mg daily.  Can try topical diclofenac gel four times a day   Can try and increased dose of trazodone.Marland Kitchen 1.5 to 2 tabs daily.

## 2019-07-16 ENCOUNTER — Encounter: Payer: Self-pay | Admitting: Oncology

## 2019-07-21 MED ORDER — RISAQUAD PO CAPS: 1.0000 | ORAL_CAPSULE | Freq: Two times a day (BID) | ORAL | 0 refills | Status: DC

## 2019-07-28 ENCOUNTER — Ambulatory Visit
Admission: RE | Admit: 2019-07-28 | Discharge: 2019-07-28 | Disposition: A | Discharge status: Home / Self Care | Payer: Medicare PPO | Source: Ambulatory Visit | Attending: Family Medicine | Admitting: Family Medicine

## 2019-07-28 DIAGNOSIS — Z79899 Other long term (current) drug therapy: Secondary | ICD-10-CM

## 2019-07-28 DIAGNOSIS — M8589 Other specified disorders of bone density and structure, multiple sites: Secondary | ICD-10-CM | POA: Diagnosis not present

## 2019-07-30 ENCOUNTER — Ambulatory Visit (HOSPITAL_COMMUNITY)
Admission: RE | Admit: 2019-07-30 | Discharge: 2019-07-30 | Disposition: A | Discharge status: Home / Self Care | Payer: Medicare PPO | Source: Ambulatory Visit | Attending: Oncology | Admitting: Oncology

## 2019-07-30 ENCOUNTER — Other Ambulatory Visit: Payer: Self-pay

## 2019-07-30 DIAGNOSIS — C61 Malignant neoplasm of prostate: Secondary | ICD-10-CM | POA: Diagnosis not present

## 2019-07-30 MED ORDER — AXUMIN (FLUCICLOVINE F 18) INJECTION
9.6000 | Freq: Once | INTRAVENOUS | Status: AC
  Administered 2019-07-30: 9.6 via INTRAVENOUS

## 2019-07-31 ENCOUNTER — Encounter: Payer: Self-pay | Admitting: Oncology

## 2019-08-06 ENCOUNTER — Other Ambulatory Visit: Payer: Self-pay

## 2019-08-06 ENCOUNTER — Inpatient Hospital Stay: Payer: Medicare PPO | Attending: Oncology | Admitting: Oncology

## 2019-08-06 ENCOUNTER — Inpatient Hospital Stay: Payer: Medicare PPO

## 2019-08-06 VITALS — BP 138/83 | HR 78 | Temp 97.7°F | Resp 18 | Ht 71.0 in | Wt 178.6 lb

## 2019-08-06 DIAGNOSIS — C61 Malignant neoplasm of prostate: Secondary | ICD-10-CM | POA: Insufficient documentation

## 2019-08-06 DIAGNOSIS — Z5111 Encounter for antineoplastic chemotherapy: Secondary | ICD-10-CM | POA: Insufficient documentation

## 2019-08-06 MED ORDER — LEUPROLIDE ACETATE 7.5 MG ~~LOC~~ KIT
7.5000 mg | PACK | Freq: Once | SUBCUTANEOUS | Status: AC
  Administered 2019-08-06: 7.5 mg via SUBCUTANEOUS
  Filled 2019-08-06: qty 7.5

## 2019-08-06 NOTE — Progress Notes (Signed)
Hematology and Oncology Follow Up Visit  Alexander Yang [id number] Jul 07, 1940 79 y.o. 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     Prior Therapy: Observation and surveillance between 2009 in 2011.  He was found to have a Gleason score 4+4 = 8 in 2011. He received radiation therapy between October 2011 until December 2011.   He received concomitant Taxotere chemotherapy as a part of a clinical trial. He also received 6 months of hormone therapy up until July 2012. His PSA nadir was down to 0.1 In December 2013.   His PSA continue to be low close to 0.1 and 0.2 Up till December 2015.   In June 2016 was up to 0.4 and his most recent PSA in September 2016 was up to 0.7.    Current therapy:   Androgen deprivation therapy with Lupron at 7.5 mg on a monthly basis started in March 2018.  Last injection will be given on January 08, 2017 and has been on hold since that time.   Lupron 7.5 mg monthly given intermittently.  He received Eligard 7.5 mg in February 2021.   Interim History:  Alexander Yang is here for a follow-up visit.  Since the last visit, he reports no major changes in his health.  He denies any recent hospitalization.  He denies any bone pain or pathological fractures.  He denies any recent falls or syncope.  Continues to be active and attends to activities of daily living.                 Medications: Unchanged on review. Current Outpatient Medications  Medication Sig Dispense Refill  . acidophilus (RISAQUAD) CAPS capsule Take 1 capsule by mouth in the morning and at bedtime. 20 capsule 0  . aspirin 81 MG tablet Take 81 mg by mouth every other day.    . Blood Glucose Monitoring Suppl (ACCU-CHEK AVIVA PLUS) w/Device KIT Use to check blood sugar 2 times a day.  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  CALCIUM-VITAMIN D PO Take 1 tablet by mouth daily.    . dapagliflozin propanediol (FARXIGA) 10 MG TABS tablet Take 10 mg by mouth daily before breakfast. (Patient taking differently: Take 5 mg by mouth daily before breakfast. ) 30 tablet 11  . glucose blood (ACCU-CHEK AVIVA) test strip Use to check blood sugar 2 times a day.  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 ketoconazole (NIZORAL) 2 % cream Apply 1 application topically daily as needed for irritation.    . Lancets (ACCU-CHEK SOFT TOUCH) lancets Use to check blood sugar 2 times a day.  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 Lancets Misc. (ACCU-CHEK SOFTCLIX LANCET DEV) KIT Use to check blood sugar 2 times a day.  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 Leuprolide Acetate, 3 Month, (ELIGARD) 22.5 MG injection Inject 22.5 mg into the skin every 3 (three) months.    Marland Kitchen losartan (COZAAR) 100 MG tablet Take 1 tablet (100 mg total) by mouth daily. 90 tablet 3  . metFORMIN (GLUCOPHAGE-XR) 500 MG 24 hr tablet TAKE 2 TABLETS BY MOUTH EVERY DAY WITH BREAKFAST 180 tablet 3  . ondansetron (ZOFRAN) 4 MG tablet Take 1-2 tablets (4-8 mg total) by mouth every 8 (eight) hours as needed for nausea or vomiting. (Patient not taking: Reported on 07/07/2019) 20 tablet 0  . Psyllium (METAMUCIL  FIBER PO) Take 1 capsule by mouth daily.    . traZODone (DESYREL) 50 MG tablet Take 0.5-1 tablets (25-50 mg total) by mouth at bedtime as needed for sleep. 30 tablet 3   No current facility-administered medications for this visit.     Allergies: No Known Allergies      Physical exam:    Blood pressure 138/83, pulse 78, temperature 97.7 F (36.5 C), temperature source Temporal, resp. rate 18, height 5' 11"  (1.803 m), weight 178 lb 9.6 oz (81 kg), SpO2 99 %.     ECOG 0.     General appearance: Comfortable appearing without any discomfort Head: Normocephalic without any trauma Oropharynx: Mucous membranes are moist and pink without any thrush or ulcers. Eyes: Pupils are equal and round reactive to  light. Lymph nodes: No cervical, supraclavicular, inguinal or axillary lymphadenopathy.   Heart:regular rate and rhythm.  S1 and S2 without leg edema. Lung: Clear without any rhonchi or wheezes.  No dullness to percussion. Abdomin: Soft, nontender, nondistended with good bowel sounds.  No hepatosplenomegaly. Musculoskeletal: No joint deformity or effusion.  Full range of motion noted. Neurological: No deficits noted on motor, sensory and deep tendon reflex exam. Skin: No petechial rash or dryness.  Appeared moist.        PSA on 04/20/2015 was 2.6.  PSA on 07/19/2015 was 3.7. PSA on 10/18/2015 was 3.2. PSA on 01/11/2016 was 4.4. PSA on 04/12/2016 was 8.2. Lupron started in March 2018. PSA on 08/23/2016 was 0.7 PSA in August 2018 was 0.7. PSA in 12/2016 0.7.  Testosterone was less than 3. PSA on April 03, 2017 was 1.5. PSA on July 02, 2017 was 5.7. PSA on 10/02/2017 was 13.7 PSA 01/02/3018 3.9 PSA on April 03, 2018 was 3.4. PSA on April 22 of 2020 was 3.0. PSA on October 24, 2018 was 2.6 PSA on January 26, 2019 was 8.5. PSA on 04/29/2019 8.3 PSA on 07/30/2019 15.7   IMPRESSION: 1. Progression of focal hypermetabolism within the right lateral mid gland, consistent with progressive prostate primary. 2. No evidence of hypermetabolic nodal or osseous metastasis. 3. Incidental findings, including: Left nephrolithiasis. Coronary artery atherosclerosis. Aortic Atherosclerosis (ICD10-I70.0).  Impression and Plan:   79 year old man with  1.  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    He has been receiving androgen deprivation therapy although intermittently and not consistently.  His PSA continues to rise with increase up to 15.7 in May 2021.  PET scan obtained on May 20 as well showed progression of focal uptake within the right lateral mid gland consistent with progressive prostate cancer.  No evidence of metastatic disease noted at  that time.  Treatment options moving forward were discussed.  Starting more consistent to androgen deprivation therapy long-term were reviewed with alternative option is to treat locally at this time given his clear evidence of disease progression in the right lateral mid gland.   After discussion today, we have opted to continue androgen deprivation therapy with Eligard at 7.5 mg monthly for the next 3 months and reevaluate in 3 months.  2.  Osteoporosis: I have recommended strategies to improve his bone density with calcium and vitamin D.  The option of Prolia will be deferred at this time.   3. Follow-up: He will return monthly for Eligard and in 3 months for repeat evaluation.  30  minutes were spent on this visit.  The time was dedicated to reviewing laboratory data, imaging studies as well as discussing treatment  options in the future.  Zola Button, MD 5/27/202110:25 AM

## 2019-08-06 NOTE — Patient Instructions (Signed)

## 2019-08-07 ENCOUNTER — Encounter: Payer: Self-pay | Admitting: Oncology

## 2019-08-07 ENCOUNTER — Telehealth: Payer: Self-pay | Admitting: Oncology

## 2019-08-07 NOTE — Telephone Encounter (Signed)
Scheduled appt per 5/28 sch message - pt wife aware of r/s

## 2019-08-07 NOTE — Telephone Encounter (Signed)
Scheduled appt per 5/27 los.  Left a vm of the appt date and time.

## 2019-08-13 DIAGNOSIS — Z8546 Personal history of malignant neoplasm of prostate: Secondary | ICD-10-CM | POA: Diagnosis not present

## 2019-08-13 DIAGNOSIS — Z79818 Long term (current) use of other agents affecting estrogen receptors and estrogen levels: Secondary | ICD-10-CM | POA: Diagnosis not present

## 2019-08-13 DIAGNOSIS — R9721 Rising PSA following treatment for malignant neoplasm of prostate: Secondary | ICD-10-CM | POA: Diagnosis not present

## 2019-08-13 DIAGNOSIS — R9389 Abnormal findings on diagnostic imaging of other specified body structures: Secondary | ICD-10-CM | POA: Diagnosis not present

## 2019-08-19 DIAGNOSIS — F5104 Psychophysiologic insomnia: Secondary | ICD-10-CM | POA: Insufficient documentation

## 2019-08-19 NOTE — Assessment & Plan Note (Signed)
Can try and increased dose of trazodone.Marland Kitchen 1.5 to 2 tabs daily.

## 2019-08-19 NOTE — Assessment & Plan Note (Signed)
Stable. Due to DM, inadequate control of A1C.

## 2019-08-19 NOTE — Assessment & Plan Note (Signed)
Stay of Farxiga at 5 mg daily.

## 2019-08-19 NOTE — Assessment & Plan Note (Signed)
Well controlled. Continue current medication.  

## 2019-08-19 NOTE — Assessment & Plan Note (Signed)
Restart atorvastain 1/2 tab daily.

## 2019-09-03 ENCOUNTER — Inpatient Hospital Stay: Payer: Medicare PPO | Attending: Oncology

## 2019-09-03 ENCOUNTER — Other Ambulatory Visit: Payer: Self-pay

## 2019-09-03 VITALS — BP 132/62 | HR 68 | Temp 97.2°F | Resp 18

## 2019-09-03 DIAGNOSIS — C61 Malignant neoplasm of prostate: Secondary | ICD-10-CM

## 2019-09-03 DIAGNOSIS — Z5111 Encounter for antineoplastic chemotherapy: Secondary | ICD-10-CM | POA: Insufficient documentation

## 2019-09-03 MED ORDER — LEUPROLIDE ACETATE 7.5 MG ~~LOC~~ KIT
7.5000 mg | PACK | Freq: Once | SUBCUTANEOUS | Status: AC
  Administered 2019-09-03: 7.5 mg via SUBCUTANEOUS
  Filled 2019-09-03: qty 7.5

## 2019-09-03 NOTE — Patient Instructions (Signed)

## 2019-09-07 ENCOUNTER — Ambulatory Visit: Payer: Medicare PPO

## 2019-09-24 ENCOUNTER — Encounter: Payer: Self-pay | Admitting: Oncology

## 2019-09-25 ENCOUNTER — Telehealth: Payer: Self-pay | Admitting: Oncology

## 2019-09-25 NOTE — Telephone Encounter (Signed)
rescheduled appt per 7/15 sh msg - unable to reach pt . Left message with new appt date and time

## 2019-10-06 ENCOUNTER — Encounter: Payer: Self-pay | Admitting: Family Medicine

## 2019-10-06 ENCOUNTER — Ambulatory Visit: Payer: Medicare PPO | Admitting: Family Medicine

## 2019-10-06 VITALS — BP 130/76 | HR 75 | Temp 97.8°F | Ht 71.0 in | Wt 176.6 lb

## 2019-10-06 DIAGNOSIS — E1149 Type 2 diabetes mellitus with other diabetic neurological complication: Secondary | ICD-10-CM

## 2019-10-06 DIAGNOSIS — E0840 Diabetes mellitus due to underlying condition with diabetic neuropathy, unspecified: Secondary | ICD-10-CM

## 2019-10-06 LAB — POCT GLYCOSYLATED HEMOGLOBIN (HGB A1C): Hemoglobin A1C: 7.9 % — AB (ref 4.0–5.6)

## 2019-10-06 NOTE — Patient Instructions (Signed)
Keep up healthy diet.  Continue current regimen.

## 2019-10-06 NOTE — Progress Notes (Signed)
Chief Complaint  Patient presents with  . Diabetes    History of Present Illness: HPI  79 year old male presents for follow upDM.  Diabetes:   Inadequate control despite  metformin, now back on farxiga 10 mg daily in last month.  SE to [employer] has helped with GI SE. Lab Results  Component Value Date   HGBA1C 7.9 (A) 10/06/2019  Using medications without difficulties: Hypoglycemic episodes:none Hyperglycemic episodes:none Feet problems: no  ulcers Blood Sugars averaging: fbs 145-165 Eye exam within last year:  He has decreased  appetite.  His prostate cancer has worsened.. he is now on higher dose of Eliguard. He does not want to be to aggreessive with DM meds.  Body mass index is 24.63 kg/m.  Wt Readings from Last 3 Encounters:  10/06/19 176 lb 9 oz (80.1 kg)  08/06/19 178 lb 9.6 oz (81 kg)  07/07/19 180 lb 12 oz (82 kg)    GFR 92  This visit occurred during the SARS-CoV-2 public health emergency.  Safety protocols were in place, including screening questions prior to the visit, additional usage of staff PPE, and extensive cleaning of exam room while observing appropriate contact time as indicated for disinfecting solutions.   COVID 19 screen:  No recent travel or known exposure to COVID19 The patient denies respiratory symptoms of COVID 19 at this time. The importance of social distancing was discussed today.     Review of Systems  Constitutional: Negative for chills and fever.  HENT: Negative for congestion and ear pain.   Eyes: Negative for pain and redness.  Respiratory: Negative for cough and shortness of breath.   Cardiovascular: Negative for chest pain, palpitations and leg swelling.  Gastrointestinal: Negative for abdominal pain, blood in stool, constipation, diarrhea, nausea and vomiting.  Genitourinary: Negative for dysuria.  Musculoskeletal: Negative for falls and myalgias.  Skin: Negative for rash.  Neurological: Negative for dizziness.   Psychiatric/Behavioral: Negative for depression. The patient is not nervous/anxious.       Past Medical History:  Diagnosis Date  . Cerebrovascular accident ([place])   . Diabetes mellitus without complication ([place])    controlled with diet  . Hypertension   . [place] ([facility]) Stroke Scale dysarthria score 1, mild to moderate dysarthria, patient slurs at least some words and, at worst, can be understood with some difficulty 1980  . Osteoporosis   . [facility])     reports that he quit smoking about 54 years ago. His smoking use included cigarettes. He has a 20.00 pack-year smoking history. He has never used smokeless tobacco. He reports current alcohol use of about 7.0 standard drinks of alcohol per week. He reports that he does not use drugs.   Current Outpatient Medications:  .  aspirin 81 MG tablet, Take 81 mg by mouth every other day., Disp: , Rfl:  .  bifidobacterium infantis (ALIGN) capsule, Take 1 capsule by mouth daily., Disp: , Rfl:  .  Blood Glucose Monitoring Suppl (ACCU-CHEK AVIVA PLUS) w/Device KIT, Use to check blood sugar 2 times a day.  Dx: E11.49, Disp: 1 kit, Rfl: 0 .  CALCIUM-VITAMIN D PO, Take 2 tablets by mouth daily. , Disp: , Rfl:  .  dapagliflozin propanediol (FARXIGA) 10 MG TABS tablet, Take 10 mg by mouth daily before breakfast., Disp: 30 tablet, Rfl: 11 .  glucose blood (ACCU-CHEK AVIVA) test strip, Use to check blood sugar 2 times a day.  Dx: E11.49, Disp: 100 each, Rfl: 11 .  ketoconazole (NIZORAL) 2 % cream, Apply 1 application topically daily as needed for irritation., Disp: , Rfl:  .  Lancets (ACCU-CHEK SOFT TOUCH) lancets, Use to check blood sugar 2 times a day.  Dx: E11.49, Disp: 100 each, Rfl: 11 .  Lancets Misc. (ACCU-CHEK SOFTCLIX LANCET DEV) KIT, Use to check blood sugar 2 times a day.  Dx: E11.49, Disp: 1 kit, Rfl: 0 .  Leuprolide Acetate, 3 Month, (ELIGARD) 22.5 MG injection, Inject 22.5 mg into the skin every 30 (thirty)  days. , Disp: , Rfl:  .  losartan (COZAAR) 100 MG tablet, Take 1 tablet (100 mg total) by mouth daily., Disp: 90 tablet, Rfl: 3 .  metFORMIN (GLUCOPHAGE-XR) 500 MG 24 hr tablet, TAKE 2 TABLETS BY MOUTH EVERY DAY WITH BREAKFAST, Disp: 180 tablet, Rfl: 3 .  ondansetron (ZOFRAN) 4 MG tablet, Take 4-8 mg by mouth every 8 (eight) hours as needed for nausea or vomiting., Disp: , Rfl:  .  Psyllium (METAMUCIL FIBER PO), Take 1 capsule by mouth every other day. , Disp: , Rfl:  .  traZODone (DESYREL) 50 MG tablet, Take 0.5-1 tablets (25-50 mg total) by mouth at bedtime as needed for sleep., Disp: 30 tablet, Rfl: 3   Observations/Objective: Blood pressure (!) 130/76, pulse 75, temperature 97.8 F (36.6 C), temperature source Temporal, height 5' 11"  (1.803 m), weight 176 lb 9 oz (80.1 kg), SpO2 97 %.  Physical Exam Constitutional:      Appearance: He is well-developed.  HENT:     Head: Normocephalic.     Right Ear: Hearing normal.     Left Ear: Hearing normal.     Nose: Nose normal.  Neck:     Thyroid: No thyroid mass or thyromegaly.     Vascular: No carotid bruit.     Trachea: Trachea normal.  Cardiovascular:     Rate and Rhythm: Normal rate and regular rhythm.     Pulses: Normal pulses.     Heart sounds: Heart sounds not distant. No murmur heard.  No friction rub. No gallop.      Comments: No peripheral edema Pulmonary:     Effort: Pulmonary effort is normal. No respiratory distress.     Breath sounds: Normal breath sounds.  Skin:    General: Skin is warm and dry.     Findings: No rash.  Psychiatric:        Speech: Speech normal.        Behavior: Behavior normal.        Thought Content: Thought content normal.      Assessment and Plan   No problem-specific Assessment & Plan notes found for this encounter.    Eliezer Lofts, MD

## 2019-10-06 NOTE — Assessment & Plan Note (Signed)
Tolerable control in setting of patient with progressive prostate cancer.  Continue metformin  BID and farxiga 10 mg daily.  Work on [employer], weight now at goal.  exercise as able. Re-eval in 6 months.

## 2019-10-07 ENCOUNTER — Inpatient Hospital Stay: Payer: Medicare PPO | Attending: Oncology

## 2019-10-07 ENCOUNTER — Other Ambulatory Visit: Payer: Self-pay

## 2019-10-07 VITALS — BP 134/72 | HR 64 | Resp 18

## 2019-10-07 DIAGNOSIS — Z5111 Encounter for antineoplastic chemotherapy: Secondary | ICD-10-CM | POA: Diagnosis not present

## 2019-10-07 DIAGNOSIS — C61 Malignant neoplasm of prostate: Secondary | ICD-10-CM | POA: Insufficient documentation

## 2019-10-07 MED ORDER — LEUPROLIDE ACETATE 7.5 MG ~~LOC~~ KIT
7.5000 mg | PACK | Freq: Once | SUBCUTANEOUS | Status: AC
  Administered 2019-10-07: 7.5 mg via SUBCUTANEOUS
  Filled 2019-10-07: qty 7.5

## 2019-10-07 NOTE — Patient Instructions (Signed)

## 2019-10-14 ENCOUNTER — Encounter: Payer: Self-pay | Admitting: Family Medicine

## 2019-10-26 ENCOUNTER — Encounter: Payer: Self-pay | Admitting: Oncology

## 2019-10-27 ENCOUNTER — Telehealth: Payer: Self-pay

## 2019-10-27 NOTE — Telephone Encounter (Signed)
TC to pt in regard to his patient advice request question  Patient question: Now that covid booster shots will soon be available for people with cancer and otherwise compromised immunity, I was wondering does my taking monthly Eligard injections qualify me?   Let pt know that per Dr Alen Blew Any cancer patient who  wants to get a booster is eligible. Patient verbalized understanding.

## 2019-11-02 DIAGNOSIS — C61 Malignant neoplasm of prostate: Secondary | ICD-10-CM | POA: Diagnosis not present

## 2019-11-05 ENCOUNTER — Ambulatory Visit: Payer: Medicare PPO | Admitting: Oncology

## 2019-11-05 ENCOUNTER — Ambulatory Visit: Payer: Medicare PPO

## 2019-11-13 ENCOUNTER — Encounter: Payer: Self-pay | Admitting: Oncology

## 2019-11-17 ENCOUNTER — Other Ambulatory Visit: Payer: Self-pay | Admitting: Medical Oncology

## 2019-11-17 ENCOUNTER — Telehealth: Payer: Self-pay | Admitting: Oncology

## 2019-11-17 DIAGNOSIS — C61 Malignant neoplasm of prostate: Secondary | ICD-10-CM

## 2019-11-17 NOTE — Telephone Encounter (Signed)
Scheduled appt per 9/7 sch msg - pt aware of appt date and time

## 2019-11-19 ENCOUNTER — Inpatient Hospital Stay: Payer: Medicare PPO

## 2019-11-19 ENCOUNTER — Inpatient Hospital Stay: Payer: Medicare PPO | Attending: Oncology | Admitting: Oncology

## 2019-11-19 ENCOUNTER — Encounter: Payer: Self-pay | Admitting: Genetic Counselor

## 2019-11-19 ENCOUNTER — Other Ambulatory Visit: Payer: Self-pay

## 2019-11-19 ENCOUNTER — Inpatient Hospital Stay (HOSPITAL_BASED_OUTPATIENT_CLINIC_OR_DEPARTMENT_OTHER): Payer: Medicare PPO | Admitting: Genetic Counselor

## 2019-11-19 VITALS — BP 145/88 | HR 64 | Temp 97.4°F | Resp 18 | Ht 71.0 in | Wt 175.1 lb

## 2019-11-19 DIAGNOSIS — C61 Malignant neoplasm of prostate: Secondary | ICD-10-CM

## 2019-11-19 DIAGNOSIS — Z5111 Encounter for antineoplastic chemotherapy: Secondary | ICD-10-CM | POA: Insufficient documentation

## 2019-11-19 MED ORDER — LEUPROLIDE ACETATE 7.5 MG ~~LOC~~ KIT
7.5000 mg | PACK | Freq: Once | SUBCUTANEOUS | Status: AC
  Administered 2019-11-19: 7.5 mg via SUBCUTANEOUS
  Filled 2019-11-19: qty 7.5

## 2019-11-19 NOTE — Patient Instructions (Signed)

## 2019-11-19 NOTE — Progress Notes (Signed)
Hematology and Oncology Follow Up Visit  Antuane Eastridge [id number] 14-Nov-1940 79 y.o. 11/19/2019 12:05 PM Bedsole, [name], [name], [name], MD   Principle Diagnosis: 79 year old man with prostate cancer diagnosed in 2009 after presenting with Gleason score of 6 and a PSA of 3.17.  He has castration-sensitive disease with local recurrence locally.    Prior Therapy: Observation and surveillance between 2009 in 2011.  He was found to have a Gleason score 4+4 = 8 in 2011. He received radiation therapy between October 2011 until December 2011.   He received concomitant Taxotere chemotherapy as a part of a clinical trial. He also received 6 months of hormone therapy up until July 2012. His PSA nadir was down to 0.1 In December 2013.   His PSA continue to be low close to 0.1 and 0.2 Up till December 2015.   In June 2016 was up to 0.4 and his most recent PSA in September 2016 was up to 0.7.    Current therapy:   Androgen deprivation therapy with Lupron at 7.5 mg on a monthly basis started in March 2018.  Last injection will be given on January 08, 2017 and has been on hold since that time.   Lupron 7.5 mg monthly given intermittently.  Last Eligard was given on October 07, 2019.   Interim History:  Mr. Fick returns today for a repeat evaluation.  Since the last visit, he reports no major changes in his health.  He denies any recent abdominal pain or diarrhea.  He denies any bone pain or pathological fractures.  He denies any urinary complaints.  He has tolerated Eligard without any issues.                 Medications: Reviewed without changes. Current Outpatient Medications  Medication Sig Dispense Refill  . aspirin 81 MG tablet Take 81 mg by mouth every other day.    Marland Kitchen atorvastatin (LIPITOR) 40 MG tablet     . bifidobacterium infantis (ALIGN) capsule Take 1 capsule by mouth daily.    . Blood Glucose Monitoring Suppl (ACCU-CHEK AVIVA PLUS) w/Device KIT Use to check  blood sugar 2 times a day.  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 CALCIUM-VITAMIN D PO Take 2 tablets by mouth daily.     . dapagliflozin propanediol (FARXIGA) 10 MG TABS tablet Take 10 mg by mouth daily before breakfast. 30 tablet 11  . glucose blood (ACCU-CHEK AVIVA) test strip Use to check blood sugar 2 times a day.  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 ketoconazole (NIZORAL) 2 % cream Apply 1 application topically daily as needed for irritation.    . Lancets (ACCU-CHEK SOFT TOUCH) lancets Use to check blood sugar 2 times a day.  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 Lancets Misc. (ACCU-CHEK SOFTCLIX LANCET DEV) KIT Use to check blood sugar 2 times a day.  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 Leuprolide Acetate, 3 Month, (ELIGARD) 22.5 MG injection Inject 22.5 mg into the skin every 30 (thirty) days.     Marland Kitchen losartan (COZAAR) 100 MG tablet Take 1 tablet (100 mg total) by mouth daily. 90 tablet 3  . metFORMIN (GLUCOPHAGE-XR) 500 MG 24 hr tablet TAKE 2 TABLETS BY MOUTH EVERY DAY WITH BREAKFAST 180 tablet 3  . ondansetron (ZOFRAN) 4 MG tablet Take 4-8 mg by mouth every 8 (eight) hours as needed for nausea or vomiting.    . Psyllium (METAMUCIL FIBER PO) Take 1 capsule by mouth every other day.     Marland Kitchen  traZODone (DESYREL) 50 MG tablet Take 0.5-1 tablets (25-50 mg total) by mouth at bedtime as needed for sleep. 30 tablet 3   No current facility-administered medications for this visit.     Allergies: No Known Allergies      Physical exam:    Blood pressure (!) 145/88, pulse 64, temperature (!) 97.4 F (36.3 C), temperature source Tympanic, resp. rate 18, height 5' 11"  (1.803 m), weight 175 lb 1.6 oz (79.4 kg), SpO2 99 %.      ECOG 0.     General appearance: Alert, awake without any distress. Head: Atraumatic without abnormalities Oropharynx: Without any thrush or ulcers. Eyes: No scleral icterus. Lymph nodes: No lymphadenopathy noted in the cervical, supraclavicular, or axillary nodes Heart:regular rate and rhythm, without any  murmurs or gallops.   Lung: Clear to auscultation without any rhonchi, wheezes or dullness to percussion. Abdomin: Soft, nontender without any shifting dullness or ascites. Musculoskeletal: No clubbing or cyanosis. Neurological: No motor or sensory deficits. Skin: No rashes or lesions.       PSA on 04/20/2015 was 2.6.  PSA on 07/19/2015 was 3.7. PSA on 10/18/2015 was 3.2. PSA on 01/11/2016 was 4.4. PSA on 04/12/2016 was 8.2. Lupron started in March 2018. PSA on 08/23/2016 was 0.7 PSA in August 2018 was 0.7. PSA in 12/2016 0.7.  Testosterone was less than 3. PSA on April 03, 2017 was 1.5. PSA on July 02, 2017 was 5.7. PSA on 10/02/2017 was 13.7 PSA 01/02/3018 3.9 PSA on April 03, 2018 was 3.4. PSA on April 22 of 2020 was 3.0. PSA on October 24, 2018 was 2.6 PSA on January 26, 2019 was 8.5. PSA on 04/29/2019 8.3 PSA on 07/30/2019 15.7 PSA on November 02, 2019 was 9.3 testosterone that is less than 3.     Impression and Plan:   79 year old man with  1.  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      The natural course of his disease was reviewed and treatment options were discussed.  His PSA on August 23 is 9.3 with low testosterone.  Risks and benefits of continuing androgen deprivation versus consideration for local therapy were reviewed.  PET imaging in May 2021 showed no distant metastasis with only local disease update.  After discussion today, he will continue with Eligard on a monthly basis I will reevaluate in 3 months.  2.  Osteoporosis: He deferred the option of Prolia and I recommended continue calcium and vitamin D supplements.  3.  Genetic considerations: He has completed genetic counseling and laboratory testing are pending for a specific mutation.  4. Follow-up: He will continue to follow monthly for  30  minutes were dedicated to this encounter.  The time was spent on reviewing his disease status,  discussing treatment options and future plan of care review.  Zola Button, MD 9/9/202112:05 PM

## 2019-11-19 NOTE — Progress Notes (Signed)
REFERRING PROVIDER: Wyatt Portela, MD [address] [place],  [place] [address]  PRIMARY PROVIDER:  Jinny Sanders, MD  PRIMARY REASON FOR VISIT:  1. Prostate cancer ([place])     HISTORY OF PRESENT ILLNESS:   [name], a 79 y.o. male, was seen for a [place] [facility] cancer genetics consultation at the request of Dr. Alen Blew due to a personal history of prostate cancer.  Mr. Pelcher presents to clinic today to discuss the possibility of a hereditary predisposition to cancer, to discuss genetic testing, and to further clarify his future cancer risks, as well as potential cancer risks for family members.   In 2008, at the age of 62, Mr. Alcindor was diagnosed with adenocarcinoma of the prostate with Gleason 6.  In 2011, repeat prostate biopsy revealed Gleason 8 (4+4).  The treatment plan included observation and surveillance between 2009 and 2011, radiation therapy with concomitant chemotherapy in 2011, hormone therapy in 2012, and currently androgen deprivation therapy.   CANCER HISTORY:  Oncology History  Prostate cancer ([facility])  08/23/2006 Initial Diagnosis   Prostate cancer. PSA 3.17.  Underwent prostate biopsy revealing Gleason 6 (3+3) prostate adenocarcinoma.  Pt elected active surveillance.    09/2008 Tumor Marker   PSA 6.34   09/2009 Tumor Marker   PSA 8.6.  Repeat prostate biopsy revealed Gleason 8 (4+4) prostate adenocarcinoma.    12/15/2009 - 02/23/2010 Radiation Therapy   Proton therapy at [facility] (high risk PR05 protocol).   81 cobalt Gy equivalent-45 fx. One field reduction off of proximal seminal vesicles after dose of 63 cobalt Gy equivalent-35 fx.  Tx complicated by diarrhea and rectal bleeding   12/2009 - 02/2010 Chemotherapy   Concomitant Taxotere (as part of PR05 protocol at [facility]).     Miscellaneous   Received 6 months ADT until 09/2010.  PSA nadir 0.1   02/2012 Tumor Marker   PSA continued to be low at 0.1 and 0.2.    08/2014 Tumor  Marker   PSA up to 0.4   11/23/2014 Tumor Marker   PSA 0.7     RISK FACTORS:  Colonoscopy: yes;  most recent colonoscopy in 02/2018; return in 10 years . 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 Cerebrovascular accident ([facility])   . Diabetes mellitus without complication ([facility])    controlled with diet  . Hypertension   . 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 Osteoporosis   . Prostate cancer [facility])     Past Surgical History:  Procedure Laterality Date  . ACL Tear  03/12/1978  . PROSTATE BIOPSY    . TONSILLECTOMY  03/13/1947  . VASECTOMY      Social History   Socioeconomic History  . Marital status: Married    Spouse name: Not on file  . Number of children: 2  . Years of education: Not on file  . Highest education level: Not on file  Occupational History  . Occupation: Retired  Tobacco Use  . Smoking status: Former Smoker    Packs/day: 2.00    Years: 10.00    Pack years: 20.00    Types: Cigarettes    Quit date: 03/12/1965    Years since quitting: 54.7  .  Smokeless tobacco: Never Used  Vaping Use  . Vaping Use: Never used  Substance and Sexual Activity  . Alcohol use: Yes    Alcohol/week: 7.0 standard drinks    Types: 3 Glasses of wine, 4 Shots of liquor per week  . Drug use: No  . Sexual activity: Never  Other Topics Concern  . Not on file  Social History Narrative   Exercising: 3-4 times a week.   Healthy eating habits.   Full Code   Has a living will, has HCPOA: Physiological scientist (reviewed 2015)   Social Determinants of Health   Financial Resource Strain: Low Risk   . Difficulty of Paying Living Expenses: Not hard at all  Food Insecurity: No Food Insecurity  . Worried  About Charity fundraiser in the Last Year: Never true  . Ran Out of Food in the Last Year: Never true  Transportation Needs: No Transportation Needs  . Lack of Transportation (Medical): No  . Lack of Transportation (Non-Medical): No  Physical Activity: Inactive  . Days of Exercise per Week: 0 days  . Minutes of Exercise per Session: 0 min  Stress: No Stress Concern Present  . Feeling of Stress : Not at all  Social Connections:   . Frequency of Communication with Friends and Family: Not on file  . Frequency of Social Gatherings with Friends and Family: Not on file  . Attends Religious Services: Not on file  . Active Member of Clubs or Organizations: Not on file  . Attends Archivist Meetings: Not on file  . Marital Status: Not on file     FAMILY HISTORY:  We obtained a detailed, 4-generation family history.  Significant diagnoses are listed below: Family History  Problem Relation Age of Onset  . Cerebral aneurysm Mother   . Diabetes Father   . Bradycardia Father   . Benign prostatic hyperplasia Father   . Supraventricular tachycardia Brother   . Supraventricular tachycardia Brother   . Cancer Cousin        maternal; unknown type, dx late 32s      Mr. Mckinney has one daughter, age 85, and one son, age 37, both without a history of cancer.  He has two brothers, ages 3 and 61, both without a history of cancer.  Mr. Bair mother passed away at age 45 and did not have a history of cancer.  He had one maternal cousin who was diagnosed with an unknown cancer in her late 58s.  No other maternal family history of cancer was reported.  Mr. Ingalsbe father passed away at age 25 and did not have cancer.  Mr. Lunn did not a report a family history of cancer in paternal relatives.   Mr. Brach is unaware of previous family history of genetic testing for hereditary cancer risks. Patient's maternal ancestors are of [place] descent, and paternal ancestors are of [place]  descent. There is no reported Ashkenazi Jewish ancestry. There is no known consanguinity.  GENETIC COUNSELING ASSESSMENT: Mr. Preble is a 79 y.o. male with a personal history of prostate cancer which is somewhat suggestive of a hereditary cancer syndrome and predisposition to cancer given the Gleason score of his prostate cancer. We, therefore, discussed and recommended the following at today's visit.   DISCUSSION: We discussed that 5 - 10% of cancer is hereditary, with most cases of hereditary prostate cancer associated with mutations in BRCA1 and BRCA2.  There are other genes that can be associated with hereditary prostate cancer syndromes.  The level of risk and type of cancer risk are gene-specific.  We discussed that testing is beneficial for several reasons including knowing how to manage individuals for risks of other cancers, identifying whether potential treatment options would be beneficial, and understanding if other family members could be at risk for cancer and allowing them to undergo genetic testing.   We reviewed the characteristics, features and inheritance patterns of hereditary cancer syndromes. We also discussed genetic testing, including the appropriate family members to test, the process of testing, insurance coverage and turn-around-time for results. We discussed the implications of a negative, positive, carrier and/or variant of uncertain significant result. We recommended Mr. Cornforth pursue genetic testing for a panel that includes genes associated with prostate cancer.  Mr. Casanova presented to clinic with a [facility] BRACAnalysis kit that he obtained.  We discussed that this test typically analyzes a limited number of genes and recommended a more comprehensive test, given his family history of a unknown cancer type in his cousin. Mr. Kepner  was offered a common hereditary cancer panel (48 genes) and an expanded pan-cancer panel (85 genes). Mr. Ebel was informed of the  benefits and limitations of each panel, including that expanded pan-cancer panels contain several preliminary evidence genes that do not have clear management guidelines at this point in time.  We also discussed that as the number of genes included on a panel increases, the chances of variants of uncertain significance increases.  After considering the benefits and limitations of each gene panel, Mr. Bonura  elected to have an expanded pan-cancer panel through [facility].  The Multi-Cancer Panel offered by [facility] includes sequencing and/or deletion duplication testing of the following 85 genes: AIP, ALK, APC, ATM, AXIN2,BAP1,  BARD1, BLM, BMPR1A, BRCA1, BRCA2, BRIP1, CASR, CDC73, CDH1, CDK4, CDKN1B, CDKN1C, CDKN2A (p14ARF), CDKN2A (p16INK4a), CEBPA, CHEK2, CTNNA1, DICER1, DIS3L2, EGFR (c.2369C>T, p.Thr790Met variant only), EPCAM (Deletion/duplication testing only), FH, FLCN, GATA2, GPC3, GREM1 (Promoter region deletion/duplication testing only), HOXB13 (c.251G>A, p.Gly84Glu), HRAS, KIT, MAX, MEN1, MET, MITF (c.952G>A, p.Glu318Lys variant only), MLH1, MSH2, MSH3, MSH6, MUTYH, NBN, NF1, NF2, NTHL1, PALB2, PDGFRA, PHOX2B, PMS2, POLD1, POLE, POT1, PRKAR1A, PTCH1, PTEN, RAD50, RAD51C, RAD51D, RB1, RECQL4, RET, RNF43, RUNX1, SDHAF2, SDHA (sequence changes only), SDHB, SDHC, SDHD, SMAD4, SMARCA4, SMARCB1, SMARCE1, STK11, SUFU, TERC, TERT, TMEM127, TP53, TSC1, TSC2, VHL, WRN and WT1.   Based on Mr. Ciszewski personal history of prostate cancer with Gleason 8, he meets NCCN medical criteria for genetic testing. Despite that he meets criteria, he may still have an out of pocket cost.  We discussed [facility]'s sponsored genetic testing program for those affected with high risk prostate cancer. Mr. Sheehan was informed that, through this program, [facility] offers testing at no cost to the patient and may elect to share de-identified patient information to third parties and commercial organizations.  After considering the  sponsored program and being informed of other cost options, Mr. Furio elected to proceed with the genetic test through the sponsored program called [facility] Detect Hereditary Prostate Cancer.   PLAN: After considering the risks, benefits, and limitations, Mr. Deharo provided informed consent to pursue genetic testing and the blood sample was sent to [facility] for analysis of the Multi-Cancer Panel. Results should be available within approximately 3 weeks' time, at which point they will be disclosed by telephone to Mr. Schnackenberg, as will any additional recommendations warranted by these results. Mr. Holzman will receive a summary of his genetic counseling visit and a copy of his results once available. This  information will also be available in Epic.   Lastly, we encouraged Mr. Scarantino to remain in contact with cancer genetics annually so that we can continuously update the family history and inform him of any changes in cancer genetics and testing that may be of benefit for this family.     Mr. Helderman questions were answered to his satisfaction today. Our contact information was provided should additional questions or concerns arise. Thank you for the referral and allowing [place] to share in the care of your patient.   Braylinn Gulden M. Joette Catching, [place], [facility] Certified Film/video [email or web address] (P) [phone]  The patient was seen for a total of 40 minutes in face-to-face genetic counseling.  This patient was discussed with Drs. Magrinat, Lindi Adie and/or Burr Medico who agrees with the above.    _______________________________________________________________________ For Office Staff:  Number of people involved in session: 1 Was an Intern/ student involved with case: no

## 2019-11-20 ENCOUNTER — Telehealth: Payer: Self-pay | Admitting: Oncology

## 2019-11-20 NOTE — Telephone Encounter (Signed)
Scheduled appointments per 9/9 los. Left message for patient with appointment details.

## 2019-11-27 ENCOUNTER — Other Ambulatory Visit: Payer: Self-pay | Admitting: Family Medicine

## 2019-12-01 ENCOUNTER — Telehealth: Payer: Self-pay | Admitting: Genetic Counselor

## 2019-12-01 ENCOUNTER — Ambulatory Visit: Payer: Self-pay | Admitting: Genetic Counselor

## 2019-12-01 ENCOUNTER — Encounter: Payer: Self-pay | Admitting: Genetic Counselor

## 2019-12-01 DIAGNOSIS — C61 Malignant neoplasm of prostate: Secondary | ICD-10-CM

## 2019-12-01 DIAGNOSIS — Z1379 Encounter for other screening for genetic and chromosomal anomalies: Secondary | ICD-10-CM

## 2019-12-01 NOTE — Telephone Encounter (Signed)
Revealed negative genetic testing.  Discussed that we do not know why he has prostate cancer. It could be sporadic, due to a different gene that we are not testing, or maybe our current technology may not be able to pick something up.  It will be important for him to keep in contact with genetics to keep up with whether additional testing may be needed.   

## 2019-12-01 NOTE — Progress Notes (Signed)
HPI:  Alexander Yang was previously seen in the [place] [facility] clinic due to a personal history of prostate cancer and concerns regarding a hereditary predisposition to cancer. Please refer to our prior cancer genetics clinic note for more information regarding our discussion, assessment and recommendations, at the time. Alexander Yang recent genetic test results were disclosed to him, as were recommendations warranted by these results. These results and recommendations are discussed in more detail below.  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  In 2011, repeat prostate biopsy revealed Gleason 8 (4+4).  The treatment plan included observation and surveillance between 2009 and 2011, radiation therapy with concomitant chemotherapy in 2011, hormone therapy in 2012, and currently androgen deprivation therapy.   Oncology History  Prostate cancer ([place])  08/23/2006 Initial Diagnosis   Prostate cancer. PSA 3.17.  Underwent prostate biopsy revealing Gleason 6 (3+3) prostate adenocarcinoma.  Pt elected active surveillance.    09/2008 Tumor Marker   PSA 6.34   09/2009 Tumor Marker   PSA 8.6.  Repeat prostate biopsy revealed Gleason 8 (4+4) prostate adenocarcinoma.    12/15/2009 - 02/23/2010 Radiation Therapy   Proton therapy at [facility] (high risk PR05 protocol).   81 cobalt Gy equivalent-45 fx. One field reduction off of proximal seminal vesicles after dose of 63 cobalt Gy equivalent-35 fx.  Tx complicated by diarrhea and rectal bleeding   12/2009 - 02/2010 Chemotherapy   Concomitant Taxotere (as part of PR05 protocol at [facility]).     Miscellaneous   Received 6 months ADT until 09/2010.  PSA nadir 0.1   02/2012 Tumor Marker   PSA continued to be low at 0.1 and 0.2.    08/2014 Tumor Marker   PSA up to 0.4   11/23/2014 Tumor Marker   PSA 0.7   11/26/2019 Genetic Testing   No  pathogenic variants detected in [facility] Multi-Cancer Panel. The Multi-Cancer Panel offered by [facility] includes sequencing and/or deletion duplication testing of the following 85 genes: AIP, ALK, APC, ATM, AXIN2,BAP1,  BARD1, BLM, BMPR1A, BRCA1, BRCA2, BRIP1, CASR, CDC73, CDH1, CDK4, CDKN1B, CDKN1C, CDKN2A (p14ARF), CDKN2A (p16INK4a), CEBPA, CHEK2, CTNNA1, DICER1, DIS3L2, EGFR (c.2369C>T, p.Thr790Met variant only), EPCAM (Deletion/duplication testing only), FH, FLCN, GATA2, GPC3, GREM1 (Promoter region deletion/duplication testing only), HOXB13 (c.251G>A, p.Gly84Glu), HRAS, KIT, MAX, MEN1, MET, MITF (c.952G>A, p.Glu318Lys variant only), MLH1, MSH2, MSH3, MSH6, MUTYH, NBN, NF1, NF2, NTHL1, PALB2, PDGFRA, PHOX2B, PMS2, POLD1, POLE, POT1, PRKAR1A, PTCH1, PTEN, RAD50, RAD51C, RAD51D, RB1, RECQL4, RET, RNF43, RUNX1, SDHAF2, SDHA (sequence changes only), SDHB, SDHC, SDHD, SMAD4, SMARCA4, SMARCB1, SMARCE1, STK11, SUFU, TERC, TERT, TMEM127, TP53, TSC1, TSC2, VHL, WRN and WT1. The report date is November 26, 2019.      FAMILY HISTORY:  We obtained a detailed, 4-generation family history.  Significant diagnoses are listed below: Family History  Problem Relation Age of Onset  . Cancer Cousin        maternal; unknown type, dx late 74s     Alexander Yang has one daughter, age 14, and one son, age 40, both without a history of cancer.  He has two brothers, ages 55 and 72, both without a history of cancer.  Alexander Yang mother passed away at age 55 and did not have a history of cancer.  He had one maternal cousin who was diagnosed with an unknown cancer in her late 54s.  No other maternal family history of  cancer was reported.  Alexander Yang father passed away at age 58 and did not have cancer.  Alexander Yang did not a report a family history of cancer in paternal relatives.   Alexander Yang is unaware of previous family history of genetic testing for hereditary cancer risks. Patient's maternal ancestors are of  [place] descent, and paternal ancestors are of [place] descent. There is no reported Ashkenazi Jewish ancestry. There is no known consanguinity.  GENETIC TEST RESULTS: Genetic testing reported out on November 26, 2019.  The [facility] Multi-Cancer panel found no pathogenic mutations. The Multi-Cancer Panel offered by [facility] includes sequencing and/or deletion duplication testing of the following 85 genes: AIP, ALK, APC, ATM, AXIN2,BAP1,  BARD1, BLM, BMPR1A, BRCA1, BRCA2, BRIP1, CASR, CDC73, CDH1, CDK4, CDKN1B, CDKN1C, CDKN2A (p14ARF), CDKN2A (p16INK4a), CEBPA, CHEK2, CTNNA1, DICER1, DIS3L2, EGFR (c.2369C>T, p.Thr790Met variant only), EPCAM (Deletion/duplication testing only), FH, FLCN, GATA2, GPC3, GREM1 (Promoter region deletion/duplication testing only), HOXB13 (c.251G>A, p.Gly84Glu), HRAS, KIT, MAX, MEN1, MET, MITF (c.952G>A, p.Glu318Lys variant only), MLH1, MSH2, MSH3, MSH6, MUTYH, NBN, NF1, NF2, NTHL1, PALB2, PDGFRA, PHOX2B, PMS2, POLD1, POLE, POT1, PRKAR1A, PTCH1, PTEN, RAD50, RAD51C, RAD51D, RB1, RECQL4, RET, RNF43, RUNX1, SDHAF2, SDHA (sequence changes only), SDHB, SDHC, SDHD, SMAD4, SMARCA4, SMARCB1, SMARCE1, STK11, SUFU, TERC, TERT, TMEM127, TP53, TSC1, TSC2, VHL, WRN and WT1.   The test report has been scanned into EPIC and is located under the Molecular Pathology section of the Results Review tab.  A portion of the result report is included below for reference.     We discussed with Alexander Yang that because current genetic testing is not perfect, it is possible there may be a gene mutation in one of these genes that current testing cannot detect, but that chance is small.  We also discussed, that there could be another gene that has not yet been discovered, or that we have not yet tested, that is responsible for the cancer diagnoses in the family. It is also possible there is a hereditary cause for the cancer in the family that Alexander Yang did not inherit and therefore was not identified in his  testing.  Therefore, it is important to remain in touch with cancer genetics in the future so that we can continue to offer Alexander Yang the most up to date genetic testing.    ADDITIONAL GENETIC TESTING: We discussed with Alexander Yang that his genetic testing was fairly extensive.  If there are genes identified to increase cancer risk that can be analyzed in the future, we would be happy to discuss and coordinate this testing at that time.    CANCER SCREENING RECOMMENDATIONS: Alexander Yang test result is considered negative (normal).  This means that we have not identified a hereditary cause for his personal history of prostate cancer at this time. Most cancers happen by chance and this negative test suggests that his cancer may fall into this category.    While reassuring, this does not definitively rule out a hereditary predisposition to cancer. It is still possible that there could be genetic mutations that are undetectable by current technology. There could be genetic mutations in genes that have not been tested or identified to increase cancer risk.  Therefore, it is recommended he continue to follow the cancer management and screening guidelines provided by his oncology and primary healthcare provider.   An individual's cancer risk and medical management are not determined by genetic test results alone. Overall cancer risk assessment incorporates additional factors, including personal medical history, family history, and any  available genetic information that may result in a personalized plan for cancer prevention and surveillance  RECOMMENDATIONS FOR FAMILY MEMBERS:  Individuals in this family might be at some increased risk of developing cancer, over the general population risk, simply due to the family history of cancer.  We recommended women in this family have a yearly mammogram beginning at age 70, or 65 years younger than the earliest onset of cancer, an annual clinical breast exam, and  perform monthly breast self-exams. Women in this family should also have a gynecological exam as recommended by their primary provider. All family members should be referred for colonoscopy starting at age 71.   FOLLOW-UP: Lastly, we discussed with Alexander Yang that cancer genetics is a rapidly advancing field and it is possible that new genetic tests will be appropriate for him and/or his family members in the future. We encouraged him to remain in contact with cancer genetics on an annual basis so we can update his personal and family histories and let him know of advances in cancer genetics that may benefit this family.   Our contact number was provided. Alexander Yang questions were answered to his satisfaction, and he knows he is welcome to call us at anytime with additional questions or concerns.    Cari M. Joette Catching, [place], [facility] Certified Film/video [email or web address] (P) [phone]

## 2019-12-17 ENCOUNTER — Other Ambulatory Visit: Payer: Self-pay

## 2019-12-17 ENCOUNTER — Inpatient Hospital Stay: Payer: Medicare PPO | Attending: Oncology

## 2019-12-17 VITALS — BP 156/79

## 2019-12-17 DIAGNOSIS — C61 Malignant neoplasm of prostate: Secondary | ICD-10-CM | POA: Diagnosis not present

## 2019-12-17 DIAGNOSIS — Z5111 Encounter for antineoplastic chemotherapy: Secondary | ICD-10-CM | POA: Diagnosis not present

## 2019-12-17 MED ORDER — LEUPROLIDE ACETATE (4 MONTH) 30 MG ~~LOC~~ KIT
PACK | SUBCUTANEOUS | Status: AC
  Filled 2019-12-17: qty 30

## 2019-12-17 MED ORDER — LEUPROLIDE ACETATE 7.5 MG ~~LOC~~ KIT
7.5000 mg | PACK | Freq: Once | SUBCUTANEOUS | Status: AC
  Administered 2019-12-17: 7.5 mg via SUBCUTANEOUS
  Filled 2019-12-17: qty 7.5

## 2019-12-17 NOTE — Patient Instructions (Signed)

## 2020-01-14 ENCOUNTER — Other Ambulatory Visit: Payer: Self-pay

## 2020-01-14 ENCOUNTER — Inpatient Hospital Stay: Payer: Medicare PPO | Attending: Oncology

## 2020-01-14 VITALS — BP 157/75 | HR 77 | Resp 16

## 2020-01-14 DIAGNOSIS — C61 Malignant neoplasm of prostate: Secondary | ICD-10-CM | POA: Diagnosis not present

## 2020-01-14 DIAGNOSIS — Z5111 Encounter for antineoplastic chemotherapy: Secondary | ICD-10-CM | POA: Insufficient documentation

## 2020-01-14 MED ORDER — LEUPROLIDE ACETATE 7.5 MG ~~LOC~~ KIT
7.5000 mg | PACK | Freq: Once | SUBCUTANEOUS | Status: AC
  Administered 2020-01-14: 7.5 mg via SUBCUTANEOUS
  Filled 2020-01-14: qty 7.5

## 2020-01-14 NOTE — Patient Instructions (Signed)

## 2020-01-26 ENCOUNTER — Other Ambulatory Visit: Payer: Self-pay

## 2020-01-26 ENCOUNTER — Ambulatory Visit: Payer: Medicare PPO | Admitting: Family Medicine

## 2020-01-26 ENCOUNTER — Other Ambulatory Visit: Payer: Self-pay | Admitting: Family Medicine

## 2020-01-26 ENCOUNTER — Encounter: Payer: Self-pay | Admitting: Family Medicine

## 2020-01-26 ENCOUNTER — Telehealth: Payer: Self-pay

## 2020-01-26 VITALS — BP 128/78 | HR 76 | Temp 97.7°F | Ht 71.0 in | Wt 175.0 lb

## 2020-01-26 DIAGNOSIS — E0840 Diabetes mellitus due to underlying condition with diabetic neuropathy, unspecified: Secondary | ICD-10-CM

## 2020-01-26 DIAGNOSIS — E1140 Type 2 diabetes mellitus with diabetic neuropathy, unspecified: Secondary | ICD-10-CM | POA: Diagnosis not present

## 2020-01-26 LAB — POCT GLYCOSYLATED HEMOGLOBIN (HGB A1C): Hemoglobin A1C: 7.8 % — AB (ref 4.0–5.6)

## 2020-01-26 MED ORDER — GLIPIZIDE ER 5 MG PO TB24
5.0000 mg | ORAL_TABLET | Freq: Every day | ORAL | 11 refills | Status: DC
Start: 2020-01-26 — End: 2020-04-28

## 2020-01-26 MED ORDER — DAPAGLIFLOZIN PROPANEDIOL 5 MG PO TABS
5.0000 mg | ORAL_TABLET | Freq: Every day | ORAL | 11 refills | Status: DC
Start: 2020-01-26 — End: 2020-04-28

## 2020-01-26 MED ORDER — DAPAGLIFLOZIN PROPANEDIOL 5 MG PO TABS
10.0000 mg | ORAL_TABLET | Freq: Every day | ORAL | 11 refills | Status: DC
Start: 2020-01-26 — End: 2020-01-26

## 2020-01-26 NOTE — Progress Notes (Signed)
Chief Complaint  Patient presents with  . Diabetes    History of Present Illness: HPI   79 year old male presents for DM management.  He has noted CBGs worsening in last several months. Lab Results  Component Value Date   HGBA1C 7.8 (A) 01/26/2020   On metformin 100 mg BID and farxiga 10 mg daily. Had SE on higher dose of metformin.  Has  Noted nausea in AMs since starting higher dose of farxiga.  FBS 195-215 since beginning of November  No recent changes in diet and activity. No recent steroids.  No new meds. No recent infection. Wt Readings from Last 3 Encounters:  01/26/20 175 lb (79.4 kg)  11/19/19 175 lb 1.6 oz (79.4 kg)  10/06/19 176 lb 9 oz (80.1 kg)    SE to trulicity GR 92 This visit occurred during the SARS-CoV-2 public health emergency.  Safety protocols were in place, including screening questions prior to the visit, additional usage of staff PPE, and extensive cleaning of exam room while observing appropriate contact time as indicated for disinfecting solutions.   COVID 19 screen:  No recent travel or known exposure to COVID19 The patient denies respiratory symptoms of COVID 19 at this time. The importance of social distancing was discussed today.     Review of Systems  Constitutional: Negative for chills and fever.  HENT: Negative for congestion and ear pain.   Eyes: Negative for pain and redness.  Respiratory: Negative for cough and shortness of breath.   Cardiovascular: Negative for chest pain, palpitations and leg swelling.  Gastrointestinal: Negative for abdominal pain, blood in stool, constipation, diarrhea, nausea and vomiting.  Genitourinary: Negative for dysuria.  Musculoskeletal: Negative for falls and myalgias.  Skin: Negative for rash.  Neurological: Negative for dizziness.  Psychiatric/Behavioral: Negative for depression. The patient is not nervous/anxious.       Past Medical History:  Diagnosis Date  . Cerebrovascular accident ([place])    . Diabetes mellitus without complication ([place])    controlled with diet  . Hypertension   . 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 Osteoporosis   . Prostate cancer [facility])     reports that he quit smoking about 54 years ago. His smoking use included cigarettes. He has a 20.00 pack-year smoking history. He has never used smokeless tobacco. He reports current alcohol use of about 7.0 standard drinks of alcohol per week. He reports that he does not use drugs.   Current Outpatient Medications:  .  aspirin 81 MG tablet, Take 81 mg by mouth every other day., Disp: , Rfl:  .  atorvastatin (LIPITOR) 40 MG tablet, , Disp: , Rfl:  .  bifidobacterium infantis (ALIGN) capsule, Take 1 capsule by mouth daily., Disp: , Rfl:  .  Blood Glucose Monitoring Suppl (ACCU-CHEK AVIVA PLUS) w/Device KIT, Use to check blood sugar 2 times a day.  Dx: E11.49, Disp: 1 kit, Rfl: 0 .  CALCIUM-VITAMIN D PO, Take 2 tablets by mouth daily. , Disp: , Rfl:  .  dapagliflozin propanediol (FARXIGA) 10 MG TABS tablet, Take 10 mg by mouth daily before breakfast., Disp: 30 tablet, Rfl: 11 .  glucose blood (ACCU-CHEK AVIVA) test strip, Use to check blood sugar 2 times a day.  Dx: E11.49, Disp: 100 each, Rfl: 11 .  ketoconazole (NIZORAL) 2 % cream, Apply 1 application topically daily as  needed for irritation., Disp: , Rfl:  .  Lancets (ACCU-CHEK SOFT TOUCH) lancets, Use to check blood sugar 2 times a day.  Dx: E11.49, Disp: 100 each, Rfl: 11 .  Lancets Misc. (ACCU-CHEK SOFTCLIX LANCET DEV) KIT, Use to check blood sugar 2 times a day.  Dx: E11.49, Disp: 1 kit, Rfl: 0 .  Leuprolide Acetate, 3 Month, (ELIGARD) 22.5 MG injection, Inject 22.5 mg into the skin every 30 (thirty) days. , Disp: , Rfl:  .  losartan (COZAAR) 100 MG tablet, TAKE 1 TABLET BY MOUTH ONCE DAILY, Disp: 90 tablet, Rfl:  3 .  metFORMIN (GLUCOPHAGE-XR) 500 MG 24 hr tablet, TAKE 2 TABLETS BY MOUTH EVERY DAY WITH BREAKFAST, Disp: 180 tablet, Rfl: 3 .  Psyllium (METAMUCIL FIBER PO), Take 1 capsule by mouth every other day. , Disp: , Rfl:  .  traZODone (DESYREL) 50 MG tablet, Take 0.5-1 tablets (25-50 mg total) by mouth at bedtime as needed for sleep., Disp: 30 tablet, Rfl: 3   Observations/Objective: Blood pressure 128/78, pulse 76, temperature 97.7 F (36.5 C), temperature source Temporal, height 5' 11"  (1.803 m), weight 175 lb (79.4 kg), SpO2 97 %.  Physical Exam Constitutional:      General: Vital signs are normal.     Appearance: He is well-developed and well-nourished.  HENT:     Head: Normocephalic.     Right Ear: Hearing normal.     Left Ear: Hearing normal.     Nose: Nose normal.     Mouth/Throat:     Mouth: Oropharynx is clear and moist and mucous membranes are normal.  Neck:     Thyroid: No thyroid mass or thyromegaly.     Vascular: No carotid bruit.     Trachea: Trachea normal.  Cardiovascular:     Rate and Rhythm: Normal rate and regular rhythm.     Pulses: Normal pulses.     Heart sounds: Heart sounds not distant. No murmur heard. No friction rub. No gallop.      Comments: No peripheral edema Pulmonary:     Effort: Pulmonary effort is normal. No respiratory distress.     Breath sounds: Normal breath sounds.  Skin:    General: Skin is warm, dry and intact.     Findings: No rash.  Psychiatric:        Mood and Affect: Mood and affect normal.        Speech: Speech normal.        Behavior: Behavior normal.        Thought Content: Thought content normal.      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 Decrease Farxiga to 5 mg daily. Start glucotrol XL daily  before meals.  In 2 weeks send via MyChart blood sugars fasting and 2 hours after  meals.        Relevant Medications   glipiZIDE (GLUCOTROL XL) 5 MG 24 hr tablet   Other Relevant Orders   POCT glycosylated hemoglobin (Hb A1C) (Completed)     Meds ordered this encounter  Medications  . glipiZIDE (GLUCOTROL XL) 5 MG 24 hr tablet    Sig: Take 1 tablet (5 mg total) by mouth daily with breakfast.    Dispense:  30 tablet    Refill:  11  . DISCONTD: dapagliflozin propanediol (FARXIGA) 5 MG TABS tablet    Sig: Take 2 tablets (10  mg total) by mouth daily before breakfast.    Dispense:  30 tablet    Refill:  11    Orders Placed This Encounter  Procedures  . POCT glycosylated hemoglobin (Hb A1C)       Eliezer Lofts, MD

## 2020-01-26 NOTE — Patient Instructions (Addendum)
Continue metformin. Decrease Farxiga to 5 mg daily. Start glucotrol XL daily  before meals.  In 2 weeks send via MyChart blood sugars fasting and 2 hours after meals.

## 2020-01-26 NOTE — Telephone Encounter (Deleted)
Received fax from [facility] stating insurance will not cover 2 tablets of Farixga.  Asking for an Rx for 10 mg tablets to take one a day.

## 2020-01-26 NOTE — Telephone Encounter (Signed)
Gerald Stabs with [facility] left v/m Farxiga 5 mg was sent in with pt taking 2 tabs or 10 mg daily. Pt told Gerald Stabs should be Farxiga 5 mg taking  One tab or 5 mg once daily. Chris request new rx sent in with correct mg and instructions.

## 2020-01-26 NOTE — Telephone Encounter (Signed)
Per AVS.  Patient was instructed to decrease Farixga to 5 mg daily.  New Rx sent to [facility].

## 2020-02-02 DIAGNOSIS — C61 Malignant neoplasm of prostate: Secondary | ICD-10-CM | POA: Diagnosis not present

## 2020-02-16 ENCOUNTER — Other Ambulatory Visit: Payer: Self-pay

## 2020-02-16 ENCOUNTER — Inpatient Hospital Stay: Payer: Medicare PPO | Admitting: Oncology

## 2020-02-16 ENCOUNTER — Inpatient Hospital Stay: Payer: Medicare PPO | Attending: Oncology

## 2020-02-16 VITALS — BP 149/86 | HR 73 | Temp 96.3°F | Resp 19 | Ht 71.0 in | Wt 180.3 lb

## 2020-02-16 DIAGNOSIS — C61 Malignant neoplasm of prostate: Secondary | ICD-10-CM | POA: Insufficient documentation

## 2020-02-16 DIAGNOSIS — Z5111 Encounter for antineoplastic chemotherapy: Secondary | ICD-10-CM | POA: Insufficient documentation

## 2020-02-16 MED ORDER — LEUPROLIDE ACETATE 7.5 MG ~~LOC~~ KIT
7.5000 mg | PACK | Freq: Once | SUBCUTANEOUS | Status: AC
  Administered 2020-02-16: 7.5 mg via SUBCUTANEOUS
  Filled 2020-02-16: qty 7.5

## 2020-02-16 NOTE — Progress Notes (Signed)
Hematology and Oncology Follow Up Visit  Alexander Yang [id number] Nov 04, 1940 79 y.o. 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  He presented with Gleason score of 6 and a PSA of 3.17 in 2009.   Prior Therapy: Observation and surveillance between 2009 in 2011.  He was found to have a Gleason score 4+4 = 8 in 2011. He received radiation therapy between October 2011 until December 2011.   He received concomitant Taxotere chemotherapy as a part of a clinical trial. He also received 6 months of hormone therapy up until July 2012. His PSA nadir was down to 0.1 In December 2013.   His PSA continue to be low close to 0.1 and 0.2 Up till December 2015.   In June 2016 was up to 0.4 and his most recent PSA in September 2016 was up to 0.7.    Current therapy:   Androgen deprivation therapy with Lupron at 7.5 mg on a monthly basis started in March 2018.  Last injection will be given on January 08, 2017 and has been on hold since that time.   Eligard 7.5 mg monthly given monthly with the last injection given in November 2021.   Interim History:  Alexander Yang is here for a follow-up visit.  Since the last visit, he reports no major changes in his health.  He continues to tolerate Eligard without any complications.  He denies any excessive fatigue, tiredness or hot flashes.  He denies any recent hospitalization or illnesses.  His performance status quality of life remain excellent.  He denies any bone pain or pathological fractures.                 Medications: Updated on review. Current Outpatient Medications  Medication Sig Dispense Refill  . ACCU-CHEK AVIVA PLUS test strip USE TO CHECK BLOOD SUGAR 2 TIMES A DAY. DX: E11.49 100 strip 11  . aspirin 81 MG tablet Take 81 mg by mouth every other day.    Marland Kitchen atorvastatin (LIPITOR) 40 MG tablet      . bifidobacterium infantis (ALIGN) capsule Take 1 capsule by mouth daily.    . Blood Glucose Monitoring Suppl (ACCU-CHEK AVIVA PLUS) w/Device KIT Use to check blood sugar 2 times a day.  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 CALCIUM-VITAMIN D PO Take 2 tablets by mouth daily.     . dapagliflozin propanediol (FARXIGA) 5 MG TABS tablet Take 1 tablet (5 mg total) by mouth daily before breakfast. 30 tablet 11  . glipiZIDE (GLUCOTROL XL) 5 MG 24 hr tablet Take 1 tablet (5 mg total) by mouth daily with breakfast. 30 tablet 11  . ketoconazole (NIZORAL) 2 % cream Apply 1 application topically daily as needed for irritation.    . Lancets (ACCU-CHEK SOFT TOUCH) lancets Use to check blood sugar 2 times a day.  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 Lancets Misc. (ACCU-CHEK SOFTCLIX LANCET DEV) KIT Use to check blood sugar 2 times a day.  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 Leuprolide Acetate, 3 Month, (ELIGARD) 22.5 MG injection Inject 22.5 mg into the skin every 30 (thirty) days.     Marland Kitchen losartan (COZAAR) 100 MG tablet TAKE 1 TABLET BY MOUTH ONCE DAILY 90 tablet 3  . metFORMIN (GLUCOPHAGE-XR) 500 MG 24 hr tablet TAKE 2 TABLETS BY MOUTH EVERY DAY WITH BREAKFAST 180 tablet 3  . Psyllium (METAMUCIL FIBER PO) Take  1 capsule by mouth every other day.     . traZODone (DESYREL) 50 MG tablet Take 0.5-1 tablets (25-50 mg total) by mouth at bedtime as needed for sleep. 30 tablet 3   No current facility-administered medications for this visit.     Allergies: No Known Allergies      Physical exam:    Blood pressure (!) 149/86, pulse 73, temperature (!) 96.3 F (35.7 C), temperature source Tympanic, resp. rate 19, height 5' 11"  (1.803 m), weight 180 lb 4.8 oz (81.8 kg), SpO2 100 %.       ECOG 0.      General appearance: Comfortable appearing without any discomfort Head: Normocephalic without any trauma Oropharynx: Mucous membranes are moist and pink without any thrush or ulcers. Eyes: Pupils are equal and round reactive to light. Lymph  nodes: No cervical, supraclavicular, inguinal or axillary lymphadenopathy.   Heart:regular rate and rhythm.  S1 and S2 without leg edema. Lung: Clear without any rhonchi or wheezes.  No dullness to percussion. Abdomin: Soft, nontender, nondistended with good bowel sounds.  No hepatosplenomegaly. Musculoskeletal: No joint deformity or effusion.  Full range of motion noted. Neurological: No deficits noted on motor, sensory and deep tendon reflex exam. Skin: No petechial rash or dryness.  Appeared moist.  Psychiatric: Mood and affect appeared appropriat       PSA on 04/20/2015 was 2.6.  PSA on 07/19/2015 was 3.7. PSA on 10/18/2015 was 3.2. PSA on 01/11/2016 was 4.4. PSA on 04/12/2016 was 8.2. Lupron started in March 2018. PSA on 08/23/2016 was 0.7 PSA in August 2018 was 0.7. PSA in 12/2016 0.7.  Testosterone was less than 3. PSA on April 03, 2017 was 1.5. PSA on July 02, 2017 was 5.7. PSA on 10/02/2017 was 13.7 PSA 01/02/3018 3.9 PSA on April 03, 2018 was 3.4. PSA on April 22 of 2020 was 3.0. PSA on October 24, 2018 was 2.6 PSA on January 26, 2019 was 8.5. PSA on 04/29/2019 8.3 PSA on 07/30/2019 15.7 PSA on 11/02/2019 9.3 testosterone that is less than 3. PSA on February 02, 2020 was 8.3.    Impression and Plan:   79 year old man with  1.  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  He is currently receiving androgen deprivation therapy intermittently up till a July 2021.  He has been receiving it continuously since that time.  Risks and benefits of continuing this option were discussed today.  Long-term complications associated with androgen deprivation including osteoporosis, hot flashes among others.  Therapy escalation also discussed including Zytiga, Xtandi and systemic chemotherapy she will be deferred at this time.   His PSA continues to show appropriate response androgen deprivation and the plan is to update his imaging  studies in May 2022.  2.  Osteoporosis: I recommended calcium and vitamin D supplements.  The role of Prolia has been reviewed previously and declined.  3.  Genetic considerations: Genetic testing is negative completed in September 2021 with no indication of genetic mutation.  4. Follow-up: He will continue to receive monthly Eligard and MD follow-up in 3 months.  30  minutes were dedicated to this encounter.  The time was spent on reviewing his disease status, discussing treatment options and future plan of care review.  Zola Button, MD 12/7/20219:31 AM

## 2020-02-16 NOTE — Patient Instructions (Signed)

## 2020-02-23 ENCOUNTER — Encounter: Payer: Self-pay | Admitting: Oncology

## 2020-03-10 ENCOUNTER — Telehealth: Payer: Self-pay | Admitting: Oncology

## 2020-03-10 NOTE — Telephone Encounter (Signed)
Called patient regarding upcoming appointments, no voicemail set up and could not speak with anyone. Calender will be mailed.

## 2020-03-15 LAB — HM DIABETES EYE EXAM

## 2020-03-16 ENCOUNTER — Inpatient Hospital Stay: Payer: Medicare PPO | Attending: Oncology

## 2020-03-16 ENCOUNTER — Other Ambulatory Visit: Payer: Self-pay

## 2020-03-16 VITALS — BP 145/81 | HR 56 | Temp 97.1°F | Resp 16 | Wt 180.4 lb

## 2020-03-16 DIAGNOSIS — C61 Malignant neoplasm of prostate: Secondary | ICD-10-CM | POA: Insufficient documentation

## 2020-03-16 DIAGNOSIS — Z5111 Encounter for antineoplastic chemotherapy: Secondary | ICD-10-CM | POA: Diagnosis not present

## 2020-03-16 MED ORDER — LEUPROLIDE ACETATE 7.5 MG ~~LOC~~ KIT
7.5000 mg | PACK | Freq: Once | SUBCUTANEOUS | Status: AC
  Administered 2020-03-16: 7.5 mg via SUBCUTANEOUS
  Filled 2020-03-16: qty 7.5

## 2020-03-16 NOTE — Patient Instructions (Signed)

## 2020-03-17 ENCOUNTER — Encounter: Payer: Self-pay | Admitting: Family Medicine

## 2020-03-28 NOTE — Assessment & Plan Note (Signed)
Continue metformin. Decrease Farxiga to 5 mg daily. Start glucotrol XL daily  before meals.  In 2 weeks send via MyChart blood sugars fasting and 2 hours after meals.  

## 2020-04-12 ENCOUNTER — Other Ambulatory Visit: Payer: Self-pay

## 2020-04-12 ENCOUNTER — Inpatient Hospital Stay: Payer: Medicare PPO | Attending: Oncology

## 2020-04-12 VITALS — BP 148/75 | HR 63 | Resp 16

## 2020-04-12 DIAGNOSIS — Z79899 Other long term (current) drug therapy: Secondary | ICD-10-CM | POA: Insufficient documentation

## 2020-04-12 DIAGNOSIS — Z5111 Encounter for antineoplastic chemotherapy: Secondary | ICD-10-CM | POA: Insufficient documentation

## 2020-04-12 DIAGNOSIS — C61 Malignant neoplasm of prostate: Secondary | ICD-10-CM | POA: Diagnosis not present

## 2020-04-12 MED ORDER — LEUPROLIDE ACETATE 7.5 MG ~~LOC~~ KIT
7.5000 mg | PACK | Freq: Once | SUBCUTANEOUS | Status: AC
  Administered 2020-04-12: 7.5 mg via SUBCUTANEOUS
  Filled 2020-04-12: qty 7.5

## 2020-04-12 MED ORDER — LEUPROLIDE ACETATE (3 MONTH) 22.5 MG ~~LOC~~ KIT
PACK | SUBCUTANEOUS | Status: AC
  Filled 2020-04-12: qty 22.5

## 2020-04-15 ENCOUNTER — Encounter: Payer: Self-pay | Admitting: Oncology

## 2020-04-22 ENCOUNTER — Other Ambulatory Visit: Payer: Self-pay | Admitting: Family Medicine

## 2020-04-28 ENCOUNTER — Ambulatory Visit: Payer: Medicare PPO | Admitting: Family Medicine

## 2020-04-28 ENCOUNTER — Other Ambulatory Visit: Payer: Self-pay

## 2020-04-28 VITALS — BP 160/80 | HR 77 | Temp 97.7°F | Ht 71.0 in | Wt 180.5 lb

## 2020-04-28 DIAGNOSIS — E1159 Type 2 diabetes mellitus with other circulatory complications: Secondary | ICD-10-CM | POA: Diagnosis not present

## 2020-04-28 DIAGNOSIS — I152 Hypertension secondary to endocrine disorders: Secondary | ICD-10-CM

## 2020-04-28 DIAGNOSIS — E0840 Diabetes mellitus due to underlying condition with diabetic neuropathy, unspecified: Secondary | ICD-10-CM

## 2020-04-28 DIAGNOSIS — R110 Nausea: Secondary | ICD-10-CM

## 2020-04-28 DIAGNOSIS — E1142 Type 2 diabetes mellitus with diabetic polyneuropathy: Secondary | ICD-10-CM

## 2020-04-28 DIAGNOSIS — R351 Nocturia: Secondary | ICD-10-CM | POA: Diagnosis not present

## 2020-04-28 LAB — POCT GLYCOSYLATED HEMOGLOBIN (HGB A1C): Hemoglobin A1C: 7.2 % — AB (ref 4.0–5.6)

## 2020-04-28 MED ORDER — GLIPIZIDE ER 10 MG PO TB24: 10.0000 mg | ORAL_TABLET | Freq: Every day | ORAL | 11 refills | Status: DC

## 2020-04-28 NOTE — Assessment & Plan Note (Signed)
Possible  New BPH... but in setting of prostate cancer (past treatment with radiation) will have him discuss with oncology before starting  flomax or finasteride ( may have benefit in lowering BP some as well if started.)

## 2020-04-28 NOTE — Assessment & Plan Note (Addendum)
Improving control  With addition of glipized.. careful to avoid lows. Wilder Glade may be given nausea.. D/C Increase glipizide XL to 10 mg daily.  Continue metformin.

## 2020-04-28 NOTE — Assessment & Plan Note (Signed)
Minimally symptomatic. Due to DM.

## 2020-04-28 NOTE — Patient Instructions (Addendum)
Stop Wilder Glade given possible assocaited nausea.  Increase glipizide  XL 10 mg daily.  If after 4 weeks if nausea is not improving off farxiga.. can try bedtime omeprazole 20 mg daily.  Call with BP measurements in next few weeks if remaining > 150/90.  Discuss nighttime urination in last 6 months with Oncologist.. consider flomax as option if felt due to BPH.

## 2020-04-28 NOTE — Progress Notes (Signed)
Patient ID: Tyce Delcid, male    DOB: 31-Mar-1940, 80 y.o.   MRN: [id number]  This visit was conducted in person.  BP (!) 160/80   Pulse 77   Temp 97.7 F (36.5 C) (Temporal)   Ht _0  (1.803 m)   Wt 180 lb 8 oz (81.9 kg)   SpO2 99%   BMI 25.17 kg/m    CC:  Chief Complaint  Patient presents with  . Follow-up    3 month- DM     Subjective:   HPI: Chinedu Agustin is a 80 y.o. male presenting on 04/28/2020 for Follow-up (3 month- DM )   Diabetes:  A1C  improving in last 6 months 7.9 to 7.8 and now down to 7.2.  On metformin, newly decreased  farxiga 5 mg, and new addition in last 3 months of  Glipizide XL 5 mg daily. Using medications without difficulties: Hypoglycemic episodes: none  Hyperglycemic episodes:none Feet problems:no ulcers Blood Sugars averaging: FBS 135-155  eye exam within last year:    He has daily Am nausea.Marland Kitchen off and on.. may have been worse on higher dose of Farxiga. Nausea goes away later in the day. occ burping. Worst in [place]. Some GERD last week with sausage but usually does not have. No pain in abdomen.  Hypertension: Recent BP elevations at home 154-168/86-95  No change in salt or stress, no recent med changes. BP Readings from Last 3 Encounters:  04/28/20 (!) 160/80  04/12/20 (!) 148/75  03/16/20 (!) 145/81  On losartan 100 mg daily Using medication without problems or lightheadedness:  Chest pain with exertion: Edema: Short of breath: Average home BPs: Other issues:     Wt Readings from Last 3 Encounters:  04/28/20 180 lb 8 oz (81.9 kg)  03/16/20 180 lb 7 oz (81.8 kg)  02/16/20 180 lb 4.8 oz (81.8 kg)     He has noted nocturia increasing in last 6 months  He has history of cancer.. next OV with oncology in 2 weeks.   Relevant past medical, surgical, family and social history reviewed and updated as indicated. Interim medical history since our last visit reviewed. Allergies and medications reviewed and updated. Outpatient  Medications Prior to Visit  Medication Sig Dispense Refill  . ACCU-CHEK AVIVA PLUS test strip USE TO CHECK BLOOD SUGAR 2 TIMES A DAY. DX: E11.49 100 strip 11  . aspirin 81 MG tablet Take 81 mg by mouth every other day.    Marland Kitchen atorvastatin (LIPITOR) 40 MG tablet     . bifidobacterium infantis (ALIGN) capsule Take 1 capsule by mouth daily.    . Blood Glucose Monitoring Suppl (ACCU-CHEK AVIVA PLUS) w/Device KIT Use to check blood sugar 2 times a day.  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 CALCIUM-VITAMIN D PO Take 2 tablets by mouth daily.     . dapagliflozin propanediol (FARXIGA) 5 MG TABS tablet Take 1 tablet (5 mg total) by mouth daily before breakfast. 30 tablet 11  . glipiZIDE (GLUCOTROL XL) 5 MG 24 hr tablet Take 1 tablet (5 mg total) by mouth daily with breakfast. 30 tablet 11  . ketoconazole (NIZORAL) 2 % cream Apply 1 application topically daily as needed for irritation.    . Lancets (ACCU-CHEK SOFT TOUCH) lancets Use to check blood sugar 2 times a day.  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 Lancets Misc. (ACCU-CHEK SOFTCLIX LANCET DEV) KIT Use to check blood sugar 2 times a day.  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  Leuprolide Acetate, 3 Month, (ELIGARD) 22.5 MG injection Inject 22.5 mg into the skin every 30 (thirty) days.     Marland Kitchen losartan (COZAAR) 100 MG tablet TAKE 1 TABLET BY MOUTH ONCE DAILY 90 tablet 3  . metFORMIN (GLUCOPHAGE-XR) 500 MG 24 hr tablet TAKE 2 TABLETS BY MOUTH EVERY DAY WITH BREAKFAST 180 tablet 3  . Psyllium (METAMUCIL FIBER PO) Take 1 capsule by mouth every other day.     . traZODone (DESYREL) 50 MG tablet Take 0.5-1 tablets (25-50 mg total) by mouth at bedtime as needed for sleep. 30 tablet 3   No facility-administered medications prior to visit.     Per HPI unless specifically indicated in ROS section below Review of Systems  Constitutional: Negative for fatigue and fever.  HENT: Negative for ear pain.   Eyes: Negative for pain.  Respiratory: Negative for cough and shortness of breath.   Cardiovascular:  Negative for chest pain, palpitations and leg swelling.  Gastrointestinal: Negative for abdominal pain.  Genitourinary: Positive for frequency. Negative for dysuria, flank pain, penile swelling, scrotal swelling, testicular pain and urgency.        Nocturia.Marland Kitchen every 2 hours.  Musculoskeletal: Negative for arthralgias.  Neurological: Negative for syncope, light-headedness and headaches.  Psychiatric/Behavioral: Negative for dysphoric mood.   Objective:  BP (!) 160/80   Pulse 77   Temp 97.7 F (36.5 C) (Temporal)   Ht _0  (1.803 m)   Wt 180 lb 8 oz (81.9 kg)   SpO2 99%   BMI 25.17 kg/m   Wt Readings from Last 3 Encounters:  04/28/20 180 lb 8 oz (81.9 kg)  03/16/20 180 lb 7 oz (81.8 kg)  02/16/20 180 lb 4.8 oz (81.8 kg)      Physical Exam Constitutional:      General: Vital signs are normal.     Appearance: He is well-developed and well-nourished.  HENT:     Head: Normocephalic.     Right Ear: Hearing normal.     Left Ear: Hearing normal.     Nose: Nose normal.     Mouth/Throat:     Mouth: Oropharynx is clear and moist and mucous membranes are normal.  Neck:     Thyroid: No thyroid mass or thyromegaly.     Vascular: No carotid bruit.     Trachea: Trachea normal.  Cardiovascular:     Rate and Rhythm: Normal rate and regular rhythm.     Pulses: Normal pulses.     Heart sounds: Heart sounds not distant. No murmur heard. No friction rub. No gallop.      Comments: No peripheral edema Pulmonary:     Effort: Pulmonary effort is normal. No respiratory distress.     Breath sounds: Normal breath sounds.  Skin:    General: Skin is warm, dry and intact.     Findings: No rash.  Psychiatric:        Mood and Affect: Mood and affect normal.        Speech: Speech normal.        Behavior: Behavior normal.        Thought Content: Thought content normal.       Diabetic foot exam: Normal inspection No skin breakdown No calluses  Normal DP pulses Normal sensation to light  touch and monofilament.. reports tingling in feet Nails normal  Results for orders placed or performed in visit on 04/28/20  POCT glycosylated hemoglobin (Hb A1C)  Result Value Ref Range   Hemoglobin A1C 7.2 (A) 4.0 -  5.6 %   HbA1c POC (<> result, manual entry)     HbA1c, POC (prediabetic range)     HbA1c, POC (controlled diabetic range)      This visit occurred during the SARS-CoV-2 public health emergency.  Safety protocols were in place, including screening questions prior to the visit, additional usage of staff PPE, and extensive cleaning of exam room while observing appropriate contact time as indicated for disinfecting solutions.   COVID 19 screen:  No recent travel or known exposure to COVID19 The patient denies respiratory symptoms of COVID 19 at this time. The importance of social distancing was discussed today.   Assessment and Plan    Problem List Items Addressed This Visit    Diabetes mellitus with neurological manifestations, controlled ([place]) - Primary (Chronic)    Improving control  With addition of glipized.. careful to avoid lows. Wilder Glade may be given nausea.. D/C Increase glipizide XL to 10 mg daily.  Continue metformin.      Relevant Orders   POCT glycosylated hemoglobin (Hb A1C) (Completed)   Diabetic peripheral neuropathy associated with type 2 diabetes mellitus ([facility]) (Chronic)    Minimally symptomatic. Due to DM.      Hypertension associated with diabetes ([place]) (Chronic)    Inadequate control.. no clear reason for recent worsening. ? Due to  Nausea.  Will continue to follow..may need new med ( with BP lowering SE) for BPH symptoms.        Nocturia    Possible  New BPH... but in setting of prostate cancer (past treatment with radiation) will have him discuss with oncology before starting  flomax or finasteride ( may have benefit in lowering BP some as well if started.)       Other Visit Diagnoses    Nausea           Eliezer Lofts, MD

## 2020-04-28 NOTE — Assessment & Plan Note (Signed)
Inadequate control.. no clear reason for recent worsening. ? Due to  Nausea.  Will continue to follow..may need new med ( with BP lowering SE) for BPH symptoms.

## 2020-05-04 DIAGNOSIS — C61 Malignant neoplasm of prostate: Secondary | ICD-10-CM | POA: Diagnosis not present

## 2020-05-10 ENCOUNTER — Inpatient Hospital Stay: Payer: Medicare PPO

## 2020-05-10 ENCOUNTER — Inpatient Hospital Stay: Payer: Medicare PPO | Attending: Oncology | Admitting: Oncology

## 2020-05-10 ENCOUNTER — Other Ambulatory Visit: Payer: Self-pay

## 2020-05-10 VITALS — BP 164/76 | HR 60 | Temp 97.9°F | Resp 15 | Ht 71.0 in | Wt 184.6 lb

## 2020-05-10 DIAGNOSIS — Z5111 Encounter for antineoplastic chemotherapy: Secondary | ICD-10-CM | POA: Insufficient documentation

## 2020-05-10 DIAGNOSIS — C61 Malignant neoplasm of prostate: Secondary | ICD-10-CM | POA: Diagnosis not present

## 2020-05-10 DIAGNOSIS — Z79899 Other long term (current) drug therapy: Secondary | ICD-10-CM | POA: Diagnosis not present

## 2020-05-10 MED ORDER — LEUPROLIDE ACETATE 7.5 MG ~~LOC~~ KIT
7.5000 mg | PACK | Freq: Once | SUBCUTANEOUS | Status: AC
  Administered 2020-05-10: 7.5 mg via SUBCUTANEOUS
  Filled 2020-05-10: qty 7.5

## 2020-05-10 NOTE — Progress Notes (Signed)
Hematology and Oncology Follow Up Visit  Alexander Yang [id number] 1940-05-02 80 y.o. 05/10/2020 8:50 AM Alexander Yang, [name], [name], MD   Principle Diagnosis: 80 year old man with prostate cancer diagnosed in 2009 after presenting with a Gleason score 6 and a PSA of 3.17.  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    Prior Therapy: Observation and surveillance between 2009 in 2011.  He was found to have a Gleason score 4+4 = 8 in 2011. He received radiation therapy between October 2011 until December 2011.   He received concomitant Taxotere chemotherapy as a part of a clinical trial. He also received 6 months of hormone therapy up until July 2012. His PSA nadir was down to 0.1 In December 2013.   His PSA continue to be low close to 0.1 and 0.2 Up till December 2015.   In June 2016 was up to 0.4 and his most recent PSA in September 2016 was up to 0.7.   Androgen deprivation therapy with Lupron at 7.5 mg on a monthly basis started in March 2018.  Last injection will be given on January 08, 2017.   Current therapy:     Eligard 7.5 mg monthly restarted in August 2019.  This is given monthly since that time.   Interim History:  [name] returns today for a follow-up evaluation.  Since the last visit, he reports a feeling well without any major complaints.  He denies any nausea, fatigue or worsening diarrhea.  He does report urinary frequency and nocturia.  He denies any bone pain or pathological fractures.  Performance status quality of life remained reasonable.                 Medications: Unchanged on review. Current Outpatient Medications  Medication Sig Dispense Refill  . ACCU-CHEK AVIVA PLUS test strip USE TO CHECK BLOOD SUGAR 2 TIMES A DAY. DX: E11.49 100 strip 11  . aspirin 81 MG tablet Take 81 mg by mouth every other day.    Marland Kitchen atorvastatin (LIPITOR) 40 MG tablet     . bifidobacterium infantis (ALIGN) capsule Take 1  capsule by mouth daily.    . Blood Glucose Monitoring Suppl (ACCU-CHEK AVIVA PLUS) w/Device KIT Use to check blood sugar 2 times a day.  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 CALCIUM-VITAMIN D PO Take 2 tablets by mouth daily.     Marland Kitchen glipiZIDE (GLUCOTROL XL) 10 MG 24 hr tablet Take 1 tablet (10 mg total) by mouth daily with breakfast. 30 tablet 11  . ketoconazole (NIZORAL) 2 % cream Apply 1 application topically daily as needed for irritation.    . Lancets (ACCU-CHEK SOFT TOUCH) lancets Use to check blood sugar 2 times a day.  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 Lancets Misc. (ACCU-CHEK SOFTCLIX LANCET DEV) KIT Use to check blood sugar 2 times a day.  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 Leuprolide Acetate, 3 Month, (ELIGARD) 22.5 MG injection Inject 22.5 mg into the skin every 30 (thirty) days.     Marland Kitchen losartan (COZAAR) 100 MG tablet TAKE 1 TABLET BY MOUTH ONCE DAILY 90 tablet 3  . metFORMIN (GLUCOPHAGE-XR) 500 MG 24 hr tablet TAKE 2 TABLETS BY MOUTH EVERY DAY WITH BREAKFAST 180 tablet 3  . Psyllium (METAMUCIL FIBER PO) Take 1 capsule by mouth every other day.     . traZODone (DESYREL) 50 MG tablet Take 0.5-1 tablets (25-50 mg total) by mouth at bedtime as needed for sleep. 30 tablet 3  No current facility-administered medications for this visit.     Allergies: No Known Allergies      Physical exam:    Blood pressure (!) 164/76, pulse 60, temperature 97.9 F (36.6 C), temperature source Tympanic, resp. rate 15, height _0  (1.803 m), weight 184 lb 9.6 oz (83.7 kg), SpO2 100 %.        ECOG 0.      General appearance: Alert, awake without any distress. Head: Atraumatic without abnormalities Oropharynx: Without any thrush or ulcers. Eyes: No scleral icterus. Lymph nodes: No lymphadenopathy noted in the cervical, supraclavicular, or axillary nodes Heart:regular rate and rhythm, without any murmurs or gallops.   Lung: Clear to auscultation without any rhonchi, wheezes or dullness to percussion. Abdomin: Soft,  nontender without any shifting dullness or ascites. Musculoskeletal: No clubbing or cyanosis. Neurological: No motor or sensory deficits. Skin: No rashes or lesions.         PSA on 04/20/2015 was 2.6.  PSA on 07/19/2015 was 3.7. PSA on 10/18/2015 was 3.2. PSA on 01/11/2016 was 4.4. PSA on 04/12/2016 was 8.2. Lupron started in March 2018. PSA on 08/23/2016 was 0.7 PSA in August 2018 was 0.7. PSA in 12/2016 0.7.  Testosterone was less than 3. PSA on April 03, 2017 was 1.5. PSA on July 02, 2017 was 5.7. PSA on 10/02/2017 was 13.7 PSA 01/02/3018 3.9 PSA on April 03, 2018 was 3.4. PSA on April 22 of 2020 was 3.0. PSA on October 24, 2018 was 2.6 PSA on January 26, 2019 was 8.5. PSA on 04/29/2019 8.3 PSA on 07/30/2019 15.7 PSA on 11/02/2019 9.3 testosterone that is less than 3. PSA on February 02, 2020 was 8.3. PSA on May 04, 2020 was 7.5.   Impression and Plan:   80 year old man with  1.  Prostate cancer diagnosed in 2009.  He has biochemical relapse with castration-sensitive disease.  His disease status was updated and treatment options were reviewed.  His PSA is declining slowly after initiation of androgen deprivation although remains detectable at this time.  I have recommended updating his staging scans and will obtain PSMA had given his biochemical relapse to consider additional treatment if a measurable disease is detected.  Treatment options would be include a localized radiation versus therapy escalation with abiraterone or enzalutamide or others.    2.  Osteoporosis: Risk of osteoporosis was reiterated given on androgen deprivation.  I recommended calcium and vitamin D supplements.  He has deferred Prolia.  3.  Urinary frequency and nocturia: I recommended follow-up with urology to better assess of his lower urinary tract symptoms.  4. Follow-up: Monthly Eligard and MD follow-up in 3 months.  30  minutes were spent on this visit.  Time was dedicated to  reviewing laboratory data, disease status update treatment options for the future.  Zola Button, MD 3/1/20228:50 AM

## 2020-05-14 ENCOUNTER — Other Ambulatory Visit: Payer: Self-pay | Admitting: Family Medicine

## 2020-05-25 ENCOUNTER — Other Ambulatory Visit: Payer: Self-pay

## 2020-05-25 ENCOUNTER — Ambulatory Visit (INDEPENDENT_AMBULATORY_CARE_PROVIDER_SITE_OTHER): Payer: Medicare PPO

## 2020-05-25 DIAGNOSIS — Z0000 Encounter for general adult medical examination without abnormal findings: Secondary | ICD-10-CM | POA: Diagnosis not present

## 2020-05-25 NOTE — Patient Instructions (Signed)
[name] , Thank you for taking time to come for your Medicare Wellness Visit. I appreciate your ongoing commitment to your health goals. Please review the following plan we discussed and let me know if I can assist you in the future.   Screening recommendations/referrals: Colonoscopy: no longer required  Recommended yearly ophthalmology/optometry visit for glaucoma screening and checkup Recommended yearly dental visit for hygiene and checkup  Vaccinations: Influenza vaccine: Up to date, completed 10/30/2019, due 10/2020  Pneumococcal vaccine: Completed series Tdap vaccine: decline-insurance Shingles vaccine: completed 1 dose 10/22/2019, 2nd dose due. Contact pharmacy to complete this.    Covid-19: Completed series  Advanced directives: Please bring a copy of your POA (Power of Attorney) and/or Living Will to your next appointment.   Conditions/risks identified: diabetes, hypertension, hyperlipidemia   Next appointment: Follow up in one year for your annual wellness visit.   Preventive Care 80 Years and Older, Male Preventive care refers to lifestyle choices and visits with your health care provider that can promote health and wellness. What does preventive care include?  A yearly physical exam. This is also called an annual well check.  Dental exams once or twice a year.  Routine eye exams. Ask your health care provider how often you should have your eyes checked.  Personal lifestyle choices, including:  Daily care of your teeth and gums.  Regular physical activity.  Eating a healthy diet.  Avoiding tobacco and drug use.  Limiting alcohol use.  Practicing safe sex.  Taking low doses of aspirin every day.  Taking vitamin and mineral supplements as recommended by your health care provider. What happens during an annual well check? The services and screenings done by your health care provider during your annual well check will depend on your age, overall health,  lifestyle risk factors, and family history of disease. Counseling  Your health care provider may ask you questions about your:  Alcohol use.  Tobacco use.  Drug use.  Emotional well-being.  Home and relationship well-being.  Sexual activity.  Eating habits.  History of falls.  Memory and ability to understand (cognition).  Work and work Statistician. Screening  You may have the following tests or measurements:  Height, weight, and BMI.  Blood pressure.  Lipid and cholesterol levels. These may be checked every 5 years, or more frequently if you are over 32 years old.  Skin check.  Lung cancer screening. You may have this screening every year starting at age 80 if you have a 30-pack-year history of smoking and currently smoke or have quit within the past 15 years.  Fecal occult blood test (FOBT) of the stool. You may have this test every year starting at age 5.  Flexible sigmoidoscopy or colonoscopy. You may have a sigmoidoscopy every 5 years or a colonoscopy every 10 years starting at age 93.  Prostate cancer screening. Recommendations will vary depending on your family history and other risks.  Hepatitis C blood test.  Hepatitis B blood test.  Sexually transmitted disease (STD) testing.  Diabetes screening. This is done by checking your blood sugar (glucose) after you have not eaten for a while (fasting). You may have this done every 1-3 years.  Abdominal aortic aneurysm (AAA) screening. You may need this if you are a current or former smoker.  Osteoporosis. You may be screened starting at age 80 if you are at high risk. Talk with your health care provider about your test results, treatment options, and if necessary, the need for more tests.  Vaccines  Your health care provider may recommend certain vaccines, such as:  Influenza vaccine. This is recommended every year.  Tetanus, diphtheria, and acellular pertussis (Tdap, Td) vaccine. You may need a Td booster  every 10 years.  Zoster vaccine. You may need this after age 45.  Pneumococcal 13-valent conjugate (PCV13) vaccine. One dose is recommended after age 80.  Pneumococcal polysaccharide (PPSV23) vaccine. One dose is recommended after age 36. Talk to your health care provider about which screenings and vaccines you need and how often you need them. This information is not intended to replace advice given to you by your health care provider. Make sure you discuss any questions you have with your health care provider. Document Released: 03/25/2015 Document Revised: 11/16/2015 Document Reviewed: 12/28/2014 Elsevier Interactive Patient Education  2017 [employer] Prevention in the Home Falls can cause injuries. They can happen to people of all ages. There are many things you can do to make your home safe and to help prevent falls. What can I do on the outside of my home?  Regularly fix the edges of walkways and driveways and fix any cracks.  Remove anything that might make you trip as you walk through a door, such as a raised step or threshold.  Trim any bushes or trees on the path to your home.  Use bright outdoor lighting.  Clear any walking paths of anything that might make someone trip, such as rocks or tools.  Regularly check to see if handrails are loose or broken. Make sure that both sides of any steps have handrails.  Any raised decks and porches should have guardrails on the edges.  Have any leaves, snow, or ice cleared regularly.  Use sand or salt on walking paths during winter.  Clean up any spills in your garage right away. This includes oil or grease spills. What can I do in the bathroom?  Use night lights.  Install grab bars by the toilet and in the tub and shower. Do not use towel bars as grab bars.  Use non-skid mats or decals in the tub or shower.  If you need to sit down in the shower, use a plastic, non-slip stool.  Keep the floor dry. Clean up any  water that spills on the floor as soon as it happens.  Remove soap buildup in the tub or shower regularly.  Attach bath mats securely with double-sided non-slip rug tape.  Do not have throw rugs and other things on the floor that can make you trip. What can I do in the bedroom?  Use night lights.  Make sure that you have a light by your bed that is easy to reach.  Do not use any sheets or blankets that are too big for your bed. They should not hang down onto the floor.  Have a firm chair that has side arms. You can use this for support while you get dressed.  Do not have throw rugs and other things on the floor that can make you trip. What can I do in the kitchen?  Clean up any spills right away.  Avoid walking on wet floors.  Keep items that you use a lot in easy-to-reach places.  If you need to reach something above you, use a strong step stool that has a grab bar.  Keep electrical cords out of the way.  Do not use floor polish or wax that makes floors slippery. If you must use wax, use non-skid floor wax.  Do not have throw rugs and other things on the floor that can make you trip. What can I do with my stairs?  Do not leave any items on the stairs.  Make sure that there are handrails on both sides of the stairs and use them. Fix handrails that are broken or loose. Make sure that handrails are as long as the stairways.  Check any carpeting to make sure that it is firmly attached to the stairs. Fix any carpet that is loose or worn.  Avoid having throw rugs at the top or bottom of the stairs. If you do have throw rugs, attach them to the floor with carpet tape.  Make sure that you have a light switch at the top of the stairs and the bottom of the stairs. If you do not have them, ask someone to add them for you. What else can I do to help prevent falls?  Wear shoes that:  Do not have high heels.  Have rubber bottoms.  Are comfortable and fit you well.  Are closed  at the toe. Do not wear sandals.  If you use a stepladder:  Make sure that it is fully opened. Do not climb a closed stepladder.  Make sure that both sides of the stepladder are locked into place.  Ask someone to hold it for you, if possible.  Clearly mark and make sure that you can see:  Any grab bars or handrails.  First and last steps.  Where the edge of each step is.  Use tools that help you move around (mobility aids) if they are needed. These include:  Canes.  Walkers.  Scooters.  Crutches.  Turn on the lights when you go into a dark area. Replace any light bulbs as soon as they burn out.  Set up your furniture so you have a clear path. Avoid moving your furniture around.  If any of your floors are uneven, fix them.  If there are any pets around you, be aware of where they are.  Review your medicines with your doctor. Some medicines can make you feel dizzy. This can increase your chance of falling. Ask your doctor what other things that you can do to help prevent falls. This information is not intended to replace advice given to you by your health care provider. Make sure you discuss any questions you have with your health care provider. Document Released: 12/23/2008 Document Revised: 08/04/2015 Document Reviewed: 04/02/2014 Elsevier Interactive Patient Education  2017 [employer].

## 2020-05-25 NOTE — Progress Notes (Signed)
PCP notes:  Health Maintenance: Foot exam- due Tdap- insurance    Abnormal Screenings: none   Patient concerns: Ongoing nausea, prilosec is not working    Nurse concerns: none   Next PCP appt.: none

## 2020-05-25 NOTE — Progress Notes (Signed)
Subjective:   Alexander Yang is a 80 y.o. male who presents for Medicare Annual/Subsequent preventive examination.  Review of Systems: N/A      I connected with the patient today by telephone and verified that I am speaking with the correct person using two identifiers. Location patient: home Location nurse: work Persons participating in the telephone visit: patient, nurse.   I discussed the limitations, risks, security and privacy concerns of performing an evaluation and management service by telephone and the availability of in person appointments. I also discussed with the patient that there may be a patient responsible charge related to this service. The patient expressed understanding and verbally consented to this telephonic visit.        Cardiac Risk Factors include: advanced age (>1mn, >>34women);male gender;diabetes mellitus;hypertension;Other (see comment), Risk factor comments: hyperlipidemia     Objective:    Today's Vitals   05/25/20 0900  PainSc: 0-No pain   There is no height or weight on file to calculate BMI.  Advanced Directives 05/25/2020 03/30/2019 03/28/2018 06/26/2017 03/26/2017 01/27/2016 10/26/2015  Does Patient Have a Medical Advance Directive? Yes Yes Yes Yes Yes Yes No  Type of AParamedicof APreemptionLiving will HGordonLiving will HChiloLiving will HStansbury ParkLiving will HSouth BendLiving will HCountry HomesLiving will -  Does patient want to make changes to medical advance directive? - - - No - Patient declined - No - Patient declined -  Copy of [place]in Chart? No - copy requested No - copy requested No - copy requested - No - copy requested No - copy requested -  Would patient like information on creating a medical advance directive? - - - - - - Yes - Educational materials given    Current Medications  (verified) Outpatient Encounter Medications as of 05/25/2020  Medication Sig  . ACCU-CHEK AVIVA PLUS test strip USE TO CHECK BLOOD SUGAR 2 TIMES A DAY. DX: E11.49  . aspirin 81 MG tablet Take 81 mg by mouth every other day.  .Marland Kitchenatorvastatin (LIPITOR) 40 MG tablet TAKE 1 TABLET BY MOUTH ONCE DAILY  . bifidobacterium infantis (ALIGN) capsule Take 1 capsule by mouth daily.  . Blood Glucose Monitoring Suppl (ACCU-CHEK AVIVA PLUS) w/Device KIT Use to check blood sugar 2 times a day.  Dx: E11.49  . CALCIUM-VITAMIN D PO Take 2 tablets by mouth daily.   .Marland KitchenglipiZIDE (GLUCOTROL XL) 10 MG 24 hr tablet Take 1 tablet (10 mg total) by mouth daily with breakfast.  . ketoconazole (NIZORAL) 2 % cream Apply 1 application topically daily as needed for irritation.  . Lancets (ACCU-CHEK SOFT TOUCH) lancets Use to check blood sugar 2 times a day.  Dx: E11.49  . Lancets Misc. (ACCU-CHEK SOFTCLIX LANCET DEV) KIT Use to check blood sugar 2 times a day.  Dx: E11.49  . Leuprolide Acetate, 3 Month, (ELIGARD) 22.5 MG injection Inject 22.5 mg into the skin every 30 (thirty) days.   .Marland Kitchenlosartan (COZAAR) 100 MG tablet TAKE 1 TABLET BY MOUTH ONCE DAILY  . metFORMIN (GLUCOPHAGE-XR) 500 MG 24 hr tablet TAKE 2 TABLETS BY MOUTH EVERY DAY WITH BREAKFAST  . Psyllium (METAMUCIL FIBER PO) Take 1 capsule by mouth every other day.   . traZODone (DESYREL) 50 MG tablet Take 0.5-1 tablets (25-50 mg total) by mouth at bedtime as needed for sleep.   No facility-administered encounter medications on file as of 05/25/2020.  Allergies (verified) Patient has no known allergies.   History: Past Medical History:  Diagnosis Date  . Cerebrovascular accident ([place])   . Diabetes mellitus without complication ([place])    controlled with diet  . Hypertension   . 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 Osteoporosis   . Prostate cancer [facility])    Past Surgical History:  Procedure Laterality Date  . ACL Tear  03/12/1978  . PROSTATE BIOPSY    . TONSILLECTOMY  03/13/1947  . VASECTOMY     Family History  Problem Relation Age of Onset  . Cerebral aneurysm Mother   . Diabetes Father   . Bradycardia Father   . Benign prostatic hyperplasia Father   . Supraventricular tachycardia Brother   . Supraventricular tachycardia Brother   . Cancer Cousin        maternal; unknown type, dx late 75s   Social History   Socioeconomic History  . Marital status: Married    Spouse name: Not on file  . Number of children: 2  . Years of education: Not on file  . Highest education level: Not on file  Occupational History  . Occupation: Retired  Tobacco Use  . Smoking status: Former Smoker    Packs/day: 2.00    Years: 10.00    Pack years: 20.00    Types: Cigarettes    Quit date: 03/12/1965    Years since quitting: 55.2  . Smokeless tobacco: Never Used  Vaping Use  . Vaping Use: Never used  Substance and Sexual Activity  . Alcohol use: Yes    Alcohol/week: 7.0 standard drinks    Types: 3 Glasses of wine, 4 Cans of beer per week  . Drug use: No  . Sexual activity: Never  Other Topics Concern  . Not on file  Social History Narrative   Exercising: 3-4 times a week.   Healthy eating habits.   Full Code   Has a living will, has HCPOA: Physiological scientist (reviewed 2015)   Social Determinants of Health   Financial Resource Strain: Low Risk   . Difficulty of Paying Living Expenses: Not hard at all  Food Insecurity: No Food Insecurity  . Worried About Charity fundraiser in the Last Year: Never true  . Ran Out of Food in the Last Year: Never true  Transportation Needs: No Transportation Needs  . Lack of Transportation (Medical): No  . Lack of Transportation (Non-Medical): No  Physical Activity: Insufficiently Active  . Days of Exercise per Week: 2 days  . Minutes of Exercise per Session: 60  min  Stress: No Stress Concern Present  . Feeling of Stress : Not at all  Social Connections: Not on file    Tobacco Counseling Counseling given: Not Answered   Clinical Intake:  Pre-visit preparation completed: Yes  Pain : No/denies pain Pain Score: 0-No pain     Nutritional Risks: Nausea/ vomitting/ diarrhea (nausea) Diabetes: Yes CBG done?: No Did pt. bring in CBG monitor from home?: No  How often do you need to have someone help you when you read instructions, pamphlets, or other written materials from your doctor or pharmacy?: 1 - Never What is the last grade level you completed in school?: some college, 3 years of college  Diabetic: Yes Nutrition Risk Assessment:  Has the patient had any N/V/D within the last 2 months?  Yes  nausea in the mornings Does the patient have any non-healing wounds?  No  Has the patient had any unintentional weight loss or weight gain?  No   Diabetes:  Is the patient diabetic?  Yes  If diabetic, was a CBG obtained today?  No  telephone visit  Did the patient bring in their glucometer from home?  No  telephone visit  How often do you monitor your CBG's? Daily (fasting 135-155).   Financial Strains and Diabetes Management:  Are you having any financial strains with the device, your supplies or your medication? No .  Does the patient want to be seen by Chronic Care Management for management of their diabetes?  No  Would the patient like to be referred to a Nutritionist or for Diabetic Management?  No   Diabetic Exams:  Diabetic Eye Exam: Completed 03/15/2020 Diabetic Foot Exam: Overdue, Pt has been advised about the importance in completing this exam.   Interpreter Needed?: No  Information entered by :: CJohnson, LPN   Activities of Daily Living In your present state of health, do you have any difficulty performing the following activities: 05/25/2020  Hearing? N  Vision? N  Difficulty concentrating or making decisions? N   Walking or climbing stairs? N  Dressing or bathing? N  Doing errands, shopping? N  Preparing Food and eating ? N  Using the Toilet? N  In the past six months, have you accidently leaked urine? N  Do you have problems with loss of bowel control? N  Managing your Medications? N  Managing your Finances? N  Housekeeping or managing your Housekeeping? N  Some recent data might be hidden    Patient Care Team: Jinny Sanders, MD as PCP - General Alen Blew Mathis Dad, MD as Consulting Physician (Oncology) Katherine Mantle, [place] as Referring Physician (Optometry)  Indicate any recent Medical Services you may have received from other than [facility] providers in the past year (date may be approximate).     Assessment:   This is a routine wellness examination for [place].  Hearing/Vision screen  Hearing Screening   125Hz  250Hz  500Hz  1000Hz  2000Hz  3000Hz  4000Hz  6000Hz  8000Hz   Right ear:           Left ear:           Vision Screening Comments: Patient gets annual eye exams   Dietary issues and exercise activities discussed: Current Exercise Habits: Structured exercise class, Type of exercise: strength training/weights, Time (Minutes): 60, Frequency (Times/Week): 2, Weekly Exercise (Minutes/Week): 120, Intensity: Moderate, Exercise limited by: None identified  Goals    . Increase physical activity     Starting 03/28/2018, I will continue to exercise for at least 60 minutes 4 days per week.     . Patient Stated     03/30/2019, I will start back exercising lifting weights and running on treadmill.     . Patient Stated     05/25/2020, I will continue to go to the gym 2 days a week for 1 hour.       Depression Screen PHQ 2/9 Scores 05/25/2020 04/28/2020 03/30/2019 03/28/2018 06/26/2017 03/26/2017 09/20/2016  PHQ - 2 Score 0 1 0 0 0 0 0  PHQ- 9 Score 0 7 0 0 - 0 -    Fall Risk Fall Risk  05/25/2020 03/30/2019 03/28/2018 06/26/2017 03/26/2017  Falls in the past year? 0 0 0 No No  Number falls in past yr: 0 0 -  - -  Injury with Fall? 0  0 - - -  Risk for fall due to : Medication side effect Medication side effect - - -  Follow up Falls evaluation completed;Falls prevention discussed Falls evaluation completed;Falls prevention discussed - - -    FALL RISK PREVENTION PERTAINING TO THE HOME:  Any stairs in or around the home? Yes  If so, are there any without handrails? No  Home free of loose throw rugs in walkways, pet beds, electrical cords, etc? Yes  Adequate lighting in your home to reduce risk of falls? Yes   ASSISTIVE DEVICES UTILIZED TO PREVENT FALLS:  Life alert? No  Use of a cane, walker or w/c? No  Grab bars in the bathroom? No  Shower chair or bench in shower? No  Elevated toilet seat or a handicapped toilet? No   TIMED UP AND GO:  Was the test performed? N/A telephone visit .    Cognitive Function: MMSE - Mini Mental State Exam 05/25/2020 03/30/2019 03/28/2018 03/26/2017 09/06/2015  Orientation to time 5 5 5 5 5   Orientation to Place 5 5 5 5 5   Registration 3 3 3 3 3   Attention/ Calculation 5 5 0 0 0  Recall 3 3 3 3 3   Language- name 2 objects - - 0 0 0  Language- repeat 1 1 1 1 1   Language- follow 3 step command - - 3 3 3   Language- read & follow direction - - 0 0 0  Write a sentence - - 0 0 0  Copy design - - 0 0 0  Total score - - 20 20 20   Mini Cog  Mini-Cog screen was completed. Maximum score is 22. A value of 0 denotes this part of the MMSE was not completed or the patient failed this part of the Mini-Cog screening.       Immunizations Immunization History  Administered Date(s) Administered  . Fluad Quad(high Dose 65+) 11/13/2018  . Influenza Whole 01/10/2007, 12/09/2007, 12/14/2008  . Influenza, High Dose Seasonal PF 12/11/2013, 12/17/2014, 12/02/2015, 12/02/2015, 12/04/2017, 10/30/2019  . Influenza-Unspecified 12/10/2012, 12/10/2016  . PFIZER(Purple Top)SARS-COV-2 Vaccination 04/03/2019, 04/23/2019, 10/30/2019  . Pneumococcal Conjugate-13 08/11/2013  .  Pneumococcal Polysaccharide-23 03/26/2007  . Td 03/26/2007  . Zoster 12/03/2007  . Zoster Recombinat (Shingrix) 10/22/2019    TDAP status: Due, Education has been provided regarding the importance of this vaccine. Advised may receive this vaccine at local pharmacy or Health Dept. Aware to provide a copy of the vaccination record if obtained from local pharmacy or Health Dept. Verbalized acceptance and understanding.  Flu Vaccine status: Up to date  Pneumococcal vaccine status: Up to date  Covid-19 vaccine status: Completed vaccines  Qualifies for Shingles Vaccine? Yes   Zostavax completed Yes   Shingrix Completed?: 1st dose completed 10/22/2019, second dose is due. Education has been provided regarding the importance of this vaccine. Patient has been advised to call insurance company to determine out of pocket expense if they have not yet received this vaccine. Advised may also receive vaccine at local pharmacy or Health Dept. Verbalized acceptance and understanding.   Screening Tests Health Maintenance  Topic Date Due  . Hepatitis C Screening  Never done  . TETANUS/TDAP  03/25/2017  . FOOT EXAM  04/06/2020  . COVID-19 Vaccine (4 - Booster for [place] series) 05/01/2020  . HEMOGLOBIN A1C  10/26/2020  . OPHTHALMOLOGY EXAM  03/15/2021  . INFLUENZA VACCINE  Completed  . PNA vac Low Risk Adult  Completed  . HPV VACCINES  Aged Out  Health Maintenance  Health Maintenance Due  Topic Date Due  . Hepatitis C Screening  Never done  . TETANUS/TDAP  03/25/2017  . FOOT EXAM  04/06/2020  . COVID-19 Vaccine (4 - Booster for Pfizer series) 05/01/2020    Colorectal cancer screening: No longer required.   Lung Cancer Screening: (Low Dose CT Chest recommended if Age 41-80 years, 30 pack-year currently smoking OR have quit w/in 15years.) does not qualify.  Additional Screening:  Hepatitis C Screening: does qualify; Completed due  Vision Screening: Recommended annual ophthalmology exams  for early detection of glaucoma and other disorders of the eye. Is the patient up to date with their annual eye exam?  Yes  Who is the provider or what is the name of the office in which the patient attends annual eye exams? [facility] If pt is not established with a provider, would they like to be referred to a provider to establish care? No .   Dental Screening: Recommended annual dental exams for proper oral hygiene  Community Resource Referral / Chronic Care Management: CRR required this visit?  No   CCM required this visit?  No      Plan:     I have personally reviewed and noted the following in the patient's chart:   . Medical and social history . Use of alcohol, tobacco or illicit drugs  . Current medications and supplements . Functional ability and status . Nutritional status . Physical activity . Advanced directives . List of other physicians . Hospitalizations, surgeries, and ER visits in previous 12 months . Vitals . Screenings to include cognitive, depression, and falls . Referrals and appointments  In addition, I have reviewed and discussed with patient certain preventive protocols, quality metrics, and best practice recommendations. A written personalized care plan for preventive services as well as general preventive health recommendations were provided to patient.   Due to this being a telephonic visit, the after visit summary with patients personalized plan was offered to patient via office or my-chart. Patient preferred to pick up at office at next visit or via mychart.   Andrez Grime, LPN   7/48/2707

## 2020-06-04 ENCOUNTER — Encounter: Payer: Self-pay | Admitting: Oncology

## 2020-06-07 ENCOUNTER — Inpatient Hospital Stay: Payer: Medicare PPO

## 2020-06-07 ENCOUNTER — Other Ambulatory Visit: Payer: Self-pay

## 2020-06-07 VITALS — BP 179/88 | HR 63 | Temp 98.0°F | Resp 18

## 2020-06-07 DIAGNOSIS — Z5111 Encounter for antineoplastic chemotherapy: Secondary | ICD-10-CM | POA: Diagnosis not present

## 2020-06-07 DIAGNOSIS — C61 Malignant neoplasm of prostate: Secondary | ICD-10-CM

## 2020-06-07 DIAGNOSIS — Z79899 Other long term (current) drug therapy: Secondary | ICD-10-CM | POA: Diagnosis not present

## 2020-06-07 MED ORDER — LEUPROLIDE ACETATE 7.5 MG ~~LOC~~ KIT
7.5000 mg | PACK | Freq: Once | SUBCUTANEOUS | Status: AC
  Administered 2020-06-07: 7.5 mg via SUBCUTANEOUS
  Filled 2020-06-07: qty 7.5

## 2020-06-07 NOTE — Patient Instructions (Signed)

## 2020-06-27 ENCOUNTER — Other Ambulatory Visit (INDEPENDENT_AMBULATORY_CARE_PROVIDER_SITE_OTHER): Payer: Medicare PPO

## 2020-06-27 ENCOUNTER — Encounter: Payer: Self-pay | Admitting: Nurse Practitioner

## 2020-06-27 ENCOUNTER — Ambulatory Visit: Payer: Medicare PPO | Admitting: Nurse Practitioner

## 2020-06-27 VITALS — BP 140/84 | HR 78 | Ht 71.0 in | Wt 185.0 lb

## 2020-06-27 DIAGNOSIS — R1031 Right lower quadrant pain: Secondary | ICD-10-CM

## 2020-06-27 DIAGNOSIS — R1030 Lower abdominal pain, unspecified: Secondary | ICD-10-CM

## 2020-06-27 DIAGNOSIS — C61 Malignant neoplasm of prostate: Secondary | ICD-10-CM

## 2020-06-27 LAB — COMPREHENSIVE METABOLIC PANEL
ALT: 35 U/L (ref 0–53)
AST: 22 U/L (ref 0–37)
Albumin: 4 g/dL (ref 3.5–5.2)
Alkaline Phosphatase: 46 U/L (ref 39–117)
BUN: 17 mg/dL (ref 6–23)
CO2: 31 mEq/L (ref 19–32)
Calcium: 9.9 mg/dL (ref 8.4–10.5)
Chloride: 103 mEq/L (ref 96–112)
Creatinine, Ser: 0.8 mg/dL (ref 0.40–1.50)
GFR: 84.24 mL/min (ref >60.00)
Glucose, Bld: 214 mg/dL — ABNORMAL HIGH (ref 70–99)
Potassium: 4.9 mEq/L (ref 3.5–5.1)
Sodium: 140 mEq/L (ref 135–145)
Total Bilirubin: 0.7 mg/dL (ref 0.2–1.2)
Total Protein: 7.1 g/dL (ref 6.0–8.3)

## 2020-06-27 LAB — CBC WITH DIFFERENTIAL/PLATELET
Basophils Absolute: 0 10*3/uL (ref 0.0–0.1)
Basophils Relative: 1 % (ref 0.0–3.0)
Eosinophils Absolute: 0.1 10*3/uL (ref 0.0–0.7)
Eosinophils Relative: 2 % (ref 0.0–5.0)
HCT: 41.2 % (ref 39.0–52.0)
Hemoglobin: 14.1 g/dL (ref 13.0–17.0)
Lymphocytes Relative: 29.9 % (ref 12.0–46.0)
Lymphs Abs: 1.3 10*3/uL (ref 0.7–4.0)
MCHC: 34.3 g/dL (ref 30.0–36.0)
MCV: 91.1 fl (ref 78.0–100.0)
Monocytes Absolute: 0.4 10*3/uL (ref 0.1–1.0)
Monocytes Relative: 8.8 % (ref 3.0–12.0)
Neutro Abs: 2.6 10*3/uL (ref 1.4–7.7)
Neutrophils Relative %: 58.3 % (ref 43.0–77.0)
Platelets: 198 10*3/uL (ref 150.0–400.0)
RBC: 4.52 Mil/uL (ref 4.22–5.81)
RDW: 12.7 % (ref 11.5–15.5)
WBC: 4.5 10*3/uL (ref 4.0–10.5)

## 2020-06-27 NOTE — Progress Notes (Signed)
RADIOLOGY SCHEDULING REQUEST SENT TO: [facility] Scheduling via secure staff message.    [facility] Phone: [phone] Fax: [phone]   Patient Name: Alexander Yang DOB: [birth year] MRN #: [id number]  Imaging Ordered: CT abd pelvis  Diagnosis: RLQ pain/lower abd pain/prostate cancer  Ordering Provider: Noralyn Pick, CRNP  Is a Prior Authorization needed? We are in the process of obtaining it now  Is the patient Diabetic? Yes  Does the patient have Hypertension? Yes  Does the patient have any implanted devices or hardware? No  Date of last BUN/Creat, if needed? 06/27/20  Patient Weight? 185lb  Is the patient able to get on the table? Yes  Has the patient been diagnosed with COVID? No  Is the patient waiting on COVID testing results? No  Thank you for your assistance! [facility] Team

## 2020-06-27 NOTE — Progress Notes (Signed)
06/27/2020 Alexander Yang [id number] 20-May-1940   Chief Complaint: Lower abdominal cramping   History of Present Illness:  Alexander Yang is a very pleasant 80 year old male with a past medical history of pretension, CVA, diabetes mellitus type 2, osteoporosis, prostate cancer s/p radiation 2011 and chemo ([place]) and 6 months of hormone therapy in 2012 with biochemical relapse in 2018 currently on Eligard, radiation proctitis and colon polyps.  He was initially seen in our office by Dr. Rachael Darby on 01/10/2018 secondary to a change in bowel pattern.  He underwent a colonoscopy 02/13/2018 which showed increased mucosal vascular pattern in the distal rectum most likely due to past radiation, a 3 mm hyperplastic polyp was removed from the sigmoid colon and random biopsies were negative for colitis.  He presents to our office today with complaints of nausea which she describes as queasiness to his lower abdomen with associated lower abdominal cramping for the past 6 months.  He awakens in the morning with lower abdominal queasiness and cramping which progressively improves by 2 or 3 PM in the afternoon.  Eating typically improves his lower abdominal discomforts.  He is passing 4 soft formed brown bowel movements daily.  No rectal bleeding or melena.  His metformin was discontinued 2 weeks ago without improvement.  He tried taking a probiotic and fiber supplement without improvement.  He takes Pepto-Bismol tablets as needed without significant improvement.  He denies having any heartburn, dysphagia or upper abdominal pain.  No vomiting.  No fever, sweats or chills.  He reported his weight has fluctuated over the past year, he weighed 195lbs then down to 175 lbs back up to 186lbs.   Colonoscopy 02/13/2018: - The examined portion of the ileum was normal. - Increased mucosa vascular pattern in the distal rectum. - One 3 mm polyp in the sigmoid colon, removed with a cold biopsy forceps. Resected  and retrieved. - Diverticulosis in the left colon. 1. Surgical [P], random sites - COLONIC MUCOSA WITH NO SPECIFIC HISTOPATHOLOGIC CHANGES - NEGATIVE FOR ACUTE INFLAMMATION, INCREASED INTRAEPITHELIAL LYMPHOCYTES OR THICKENED SUBEPITHELIAL COLLAGEN TABLE 2. Surgical [P], sigmoid, polyp - HYPERPLASTIC POLYP  Colonoscopy 01/17/2011 in [place]: Radiation proctitis, and 8 to 9 mm distal sigmoid polyp removed -no pathology report available  Normal by Dr. Deatra Ina 05/2007 : Diverticulosis, no polyps.   Current Medications, Allergies, Past Medical History, Past Surgical History, Family History and Social History were reviewed in [employer] record.   Review of Systems:   Constitutional: Negative for fever, sweats, chills or weight loss.  Respiratory: Negative for shortness of breath.   Cardiovascular: Negative for chest pain, palpitations and leg swelling.  Gastrointestinal: See HPI.  Musculoskeletal: Negative for back pain or muscle aches.  Neurological: Negative for dizziness, headaches or paresthesias.    Physical Exam: BP 140/84 (BP Location: Left Arm, Patient Position: Sitting)   Pulse 78   Ht 5\' 11"  (1.803 m)   Wt 185 lb (83.9 kg)   SpO2 99%   BMI 25.80 kg/m  General: 80 year old male in no acute distress. Head: Normocephalic and atraumatic. Eyes: No scleral icterus. Conjunctiva pink . Ears: Normal auditory acuity. Mouth: Dentition intact. No ulcers or lesions.  Lungs: Clear throughout to auscultation. Heart: Regular rate and rhythm, no murmur. Abdomen: Soft, nondistended. RLQ tenderness, central lower abdominal tenderness without rebound or guarding. No masses or hepatomegaly. Normal bowel sounds x 4 quadrants.  Rectal: Deferred.  Musculoskeletal: Symmetrical with no gross deformities. Extremities: No edema. Neurological: Alert  oriented x 4. No focal deficits.  Psychological: Alert and cooperative. Normal mood and affect  Assessment and  Recommendations:  18. 80 year old male with lower abdominal "nausea" and lower abdominal cramping.  -CBC, CMP -CTAP with oral and IV contrast. BUN/Cr levels to be reviewed prior to the patient proceeding with CT with IV contrast -IBgard 1 p.o. twice daily as needed abdominal cramping -Diet as tolerated -Patient to call our office if symptoms worsen  2.  History of prostate cancer initially diagnosed in 2011 s/p radiation, chemo with relapse in 2018 on Eligard injections followed by Dr. Alen Blew   3. History of a hyperplastic colon polyp per colonoscopy 02/2018 -No further colonoscopies warranted unless medically indicated

## 2020-06-27 NOTE — Patient Instructions (Addendum)
If you are age 80 or older, your body mass index should be between 23-30. Your Body mass index is 25.8 kg/m. If this is out of the aforementioned range listed, please consider follow up with your Primary Care Provider.  LABS:  Lab work has been ordered for you today. Our lab is located in the basement. Press "B" on the elevator. The lab is located at the first door on the left as you exit the elevator.  HEALTHCARE LAWS AND MY CHART RESULTS: Due to recent changes in healthcare laws, you may see the results of your imaging and laboratory studies on MyChart before your provider has had a chance to review them.   We understand that in some cases there may be results that are confusing or concerning to you. Not all laboratory results come back in the same time frame and the provider may be waiting for multiple results in order to interpret others.  Please give [place] 48 hours in order for your provider to thoroughly review all the results before contacting the office for clarification of your results.   IMAGING:  . You will be contacted by [facility] (Your caller ID will indicate phone # [phone]) in the next 2 days to schedule your CT scan. If you have not heard from them within 2 business days, please call [facility] at [phone] to follow up on the status of your appointment.    OVER THE COUNTER MEDICATION Please purchase the following medications over the counter and take as directed:  IBgard- take one capsule twice a day as need for abdominal cramping.  Please call our office if your symptoms worsen.  It was great seeing you today! Thank you for entrusting me with your care and choosing [facility].  Noralyn Pick, CRNP

## 2020-07-01 ENCOUNTER — Ambulatory Visit (HOSPITAL_COMMUNITY)
Admission: RE | Admit: 2020-07-01 | Discharge: 2020-07-01 | Disposition: A | Discharge status: Home / Self Care | Payer: Medicare PPO | Source: Ambulatory Visit | Attending: Nurse Practitioner | Admitting: Nurse Practitioner

## 2020-07-01 ENCOUNTER — Other Ambulatory Visit: Payer: Self-pay

## 2020-07-01 DIAGNOSIS — R1030 Lower abdominal pain, unspecified: Secondary | ICD-10-CM | POA: Diagnosis not present

## 2020-07-01 DIAGNOSIS — R1031 Right lower quadrant pain: Secondary | ICD-10-CM | POA: Diagnosis not present

## 2020-07-01 DIAGNOSIS — C61 Malignant neoplasm of prostate: Secondary | ICD-10-CM | POA: Diagnosis not present

## 2020-07-01 MED ORDER — IOHEXOL 300 MG/ML  SOLN
100.0000 mL | Freq: Once | INTRAMUSCULAR | Status: AC | PRN
  Administered 2020-07-01: 100 mL via INTRAVENOUS

## 2020-07-04 NOTE — Progress Notes (Signed)
____________________________________________________________  Attending physician addendum:  Thank you for sending this case to me. I have reviewed the entire note and agree with the plan.   Quran Vasco Danis, MD  ____________________________________________________________  

## 2020-07-05 ENCOUNTER — Inpatient Hospital Stay: Payer: Medicare PPO | Attending: Oncology

## 2020-07-05 ENCOUNTER — Other Ambulatory Visit: Payer: Self-pay

## 2020-07-05 VITALS — BP 146/76 | HR 62 | Resp 18

## 2020-07-05 DIAGNOSIS — Z79899 Other long term (current) drug therapy: Secondary | ICD-10-CM | POA: Diagnosis not present

## 2020-07-05 DIAGNOSIS — C61 Malignant neoplasm of prostate: Secondary | ICD-10-CM | POA: Insufficient documentation

## 2020-07-05 DIAGNOSIS — Z5111 Encounter for antineoplastic chemotherapy: Secondary | ICD-10-CM | POA: Diagnosis not present

## 2020-07-05 MED ORDER — LEUPROLIDE ACETATE 7.5 MG ~~LOC~~ KIT
7.5000 mg | PACK | Freq: Once | SUBCUTANEOUS | Status: AC
  Administered 2020-07-05: 7.5 mg via SUBCUTANEOUS
  Filled 2020-07-05: qty 7.5

## 2020-07-05 NOTE — Patient Instructions (Signed)

## 2020-07-07 ENCOUNTER — Telehealth: Payer: Self-pay

## 2020-07-07 ENCOUNTER — Other Ambulatory Visit: Payer: Self-pay | Admitting: Family Medicine

## 2020-07-07 MED ORDER — TAMSULOSIN HCL 0.4 MG PO CAPS: 0.4000 mg | ORAL_CAPSULE | Freq: Every day | ORAL | 0 refills | Status: DC

## 2020-07-07 NOTE — Telephone Encounter (Signed)
Alexander Yang, I sent a reply to the patient and I agree his kidney concerns and aortic atherosclerosis findings on CT should be addressed by his PCP. Pls forward copy of CT to his PCP. Thx

## 2020-07-07 NOTE — Telephone Encounter (Signed)
Copy of the CT abdomen and pelvis from 07/01/2020 forwarded to PCP, Dr Eliezer Lofts, MD.

## 2020-07-11 ENCOUNTER — Ambulatory Visit: Payer: Medicare PPO | Admitting: Gastroenterology

## 2020-07-12 ENCOUNTER — Other Ambulatory Visit: Payer: Self-pay | Admitting: Nurse Practitioner

## 2020-07-12 MED ORDER — DICYCLOMINE HCL 10 MG PO CAPS: 10.0000 mg | ORAL_CAPSULE | Freq: Two times a day (BID) | ORAL | 0 refills | Status: DC | PRN

## 2020-07-13 ENCOUNTER — Other Ambulatory Visit: Payer: Self-pay | Admitting: Nurse Practitioner

## 2020-07-13 MED ORDER — DICYCLOMINE HCL 10 MG PO CAPS: 10.0000 mg | ORAL_CAPSULE | Freq: Two times a day (BID) | ORAL | 0 refills | Status: DC | PRN

## 2020-08-01 ENCOUNTER — Encounter: Payer: Self-pay | Admitting: Oncology

## 2020-08-02 ENCOUNTER — Other Ambulatory Visit: Payer: Self-pay | Admitting: Nurse Practitioner

## 2020-08-02 MED ORDER — DICYCLOMINE HCL 10 MG PO CAPS: 10.0000 mg | ORAL_CAPSULE | Freq: Two times a day (BID) | ORAL | 0 refills | Status: DC | PRN

## 2020-08-03 ENCOUNTER — Other Ambulatory Visit: Payer: Self-pay

## 2020-08-03 DIAGNOSIS — C61 Malignant neoplasm of prostate: Secondary | ICD-10-CM | POA: Diagnosis not present

## 2020-08-03 DIAGNOSIS — N401 Enlarged prostate with lower urinary tract symptoms: Secondary | ICD-10-CM | POA: Diagnosis not present

## 2020-08-03 DIAGNOSIS — R351 Nocturia: Secondary | ICD-10-CM | POA: Diagnosis not present

## 2020-08-03 DIAGNOSIS — N200 Calculus of kidney: Secondary | ICD-10-CM | POA: Diagnosis not present

## 2020-08-03 MED ORDER — FAMOTIDINE 20 MG PO TABS: 20.0000 mg | ORAL_TABLET | Freq: Every day | ORAL | 3 refills | Status: DC

## 2020-08-04 ENCOUNTER — Telehealth: Payer: Self-pay | Admitting: Oncology

## 2020-08-04 NOTE — Telephone Encounter (Signed)
R/s appts per 5/25 sch msg. Called pt, no answer. Left msg with appt date and times.

## 2020-08-05 ENCOUNTER — Other Ambulatory Visit: Payer: Self-pay

## 2020-08-05 DIAGNOSIS — R1030 Lower abdominal pain, unspecified: Secondary | ICD-10-CM

## 2020-08-05 DIAGNOSIS — R197 Diarrhea, unspecified: Secondary | ICD-10-CM

## 2020-08-05 NOTE — Telephone Encounter (Signed)
Beth, pls enter order for stool culture as he is concerned he was exposed to salmonella.  I will inform patient to go to the lab to pick up the stool container. Thx

## 2020-08-10 ENCOUNTER — Ambulatory Visit: Payer: Medicare PPO

## 2020-08-10 ENCOUNTER — Ambulatory Visit: Payer: Medicare PPO | Admitting: Oncology

## 2020-08-10 ENCOUNTER — Other Ambulatory Visit: Payer: Medicare PPO

## 2020-08-15 ENCOUNTER — Other Ambulatory Visit: Payer: Self-pay

## 2020-08-15 ENCOUNTER — Ambulatory Visit (HOSPITAL_COMMUNITY)
Admission: RE | Admit: 2020-08-15 | Discharge: 2020-08-15 | Disposition: A | Discharge status: Home / Self Care | Payer: Medicare PPO | Source: Ambulatory Visit | Attending: Oncology | Admitting: Oncology

## 2020-08-15 DIAGNOSIS — C61 Malignant neoplasm of prostate: Secondary | ICD-10-CM | POA: Insufficient documentation

## 2020-08-15 MED ORDER — PIFLIFOLASTAT F 18 (PYLARIFY) INJECTION
9.0000 | Freq: Once | INTRAVENOUS | Status: AC
  Administered 2020-08-15: 9.49 via INTRAVENOUS

## 2020-08-17 ENCOUNTER — Other Ambulatory Visit: Payer: Medicare PPO

## 2020-08-17 DIAGNOSIS — R1030 Lower abdominal pain, unspecified: Secondary | ICD-10-CM

## 2020-08-17 DIAGNOSIS — R197 Diarrhea, unspecified: Secondary | ICD-10-CM

## 2020-08-18 ENCOUNTER — Telehealth: Payer: Self-pay | Admitting: Family Medicine

## 2020-08-18 NOTE — Telephone Encounter (Signed)
Spoke with patient scheduled CPE with fasting labs 

## 2020-08-18 NOTE — Telephone Encounter (Signed)
Please schedule CPE with fasting labs prior with Dr. [name].  Looks like he had his [place] with [place] back in March.

## 2020-08-19 ENCOUNTER — Other Ambulatory Visit: Payer: Self-pay

## 2020-08-19 ENCOUNTER — Inpatient Hospital Stay: Payer: Medicare PPO

## 2020-08-19 ENCOUNTER — Inpatient Hospital Stay: Payer: Medicare PPO | Attending: Oncology | Admitting: Oncology

## 2020-08-19 VITALS — BP 166/92 | HR 65 | Temp 98.4°F | Resp 17 | Wt 186.5 lb

## 2020-08-19 DIAGNOSIS — Z5111 Encounter for antineoplastic chemotherapy: Secondary | ICD-10-CM | POA: Diagnosis not present

## 2020-08-19 DIAGNOSIS — C61 Malignant neoplasm of prostate: Secondary | ICD-10-CM | POA: Diagnosis not present

## 2020-08-19 MED ORDER — LEUPROLIDE ACETATE 7.5 MG ~~LOC~~ KIT
7.5000 mg | PACK | Freq: Once | SUBCUTANEOUS | Status: AC
  Administered 2020-08-19: 7.5 mg via SUBCUTANEOUS
  Filled 2020-08-19: qty 7.5

## 2020-08-19 MED ORDER — DICYCLOMINE HCL 10 MG PO CAPS: 10.0000 mg | ORAL_CAPSULE | Freq: Two times a day (BID) | ORAL | 0 refills | Status: DC | PRN

## 2020-08-19 NOTE — Progress Notes (Signed)
Hematology and Oncology Follow Up Visit  Alexander Yang [id number] 12/27/1940 80 y.o. 08/19/2020 11:19 AM Yang, Alexander E, [name], Alexander E, MD   Principle Diagnosis: 80 year old man with biochemically recurrent prostate cancer after initially diagnosed with a Gleason score 6 and a PSA of 3.17 in 2009.   Prior Therapy: Observation and surveillance between 2009 in 2011.  He was found to have a Gleason score 4+4 = 8 in 2011. He received radiation therapy between October 2011 until December 2011.   He received concomitant Taxotere chemotherapy as a part of a clinical trial.  He also received 6 months of hormone therapy up until July 2012. His PSA nadir was down to 0.1  In December 2013.   His PSA continue to be low close to 0.1 and 0.2  Up till December 2015.   In June 2016 was up to 0.4 and his most recent PSA in September 2016 was up to 0.7.   Androgen deprivation therapy with Lupron at 7.5 mg on a monthly basis started in March 2018.  Last injection will be given on January 08, 2017.   Current therapy:     Eligard 7.5 mg monthly restarted in August 2019.   Last treatment given on July 05, 2020.   Interim History:  Alexander Yang presents today for a follow-up visit.  Since the last visit, he reports no major changes in his health.  He has reported some stomach issues predominantly symptoms of nausea and decrease in his appetite.  CT scan of the abdomen and pelvis obtained on July 01, 2020 did not show any acute abnormalities and currently under evaluation by GI.  He denies any urinary frequency urgency or hesitancy.  He denies any hospitalization or illnesses.                Medications: Updated on review. Current Outpatient Medications  Medication Sig Dispense Refill   ACCU-CHEK AVIVA PLUS test strip USE TO CHECK BLOOD SUGAR 2 TIMES A DAY. 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     atorvastatin (LIPITOR) 40 MG tablet  TAKE 1 TABLET BY MOUTH ONCE DAILY 90 tablet 0   Blood Glucose Monitoring Suppl (ACCU-CHEK AVIVA PLUS) w/Device KIT Use to check blood sugar 2 times a day.  Dx: E11.49 1 kit 0   CALCIUM-VITAMIN D PO Take 2 tablets by mouth daily.      dicyclomine (BENTYL) 10 MG capsule Take 1 capsule (10 mg total) by mouth 2 (two) times daily as needed for spasms. 30 capsule 0   famotidine (PEPCID) 20 MG tablet Take 1 tablet (20 mg total) by mouth at bedtime. 30 tablet 3   glipiZIDE (GLUCOTROL XL) 10 MG 24 hr tablet Take 1 tablet (10 mg total) by mouth daily with breakfast. 30 tablet 11   ketoconazole (NIZORAL) 2 % cream Apply 1 application topically daily as needed for irritation.     Lancets (ACCU-CHEK SOFT TOUCH) lancets Use to check blood sugar 2 times a day.  Dx: E11.49 100 each 11   Lancets Misc. (ACCU-CHEK SOFTCLIX LANCET DEV) KIT Use to check blood sugar 2 times a day.  Dx: E11.49 1 kit 0   Leuprolide Acetate, 3 Month, (ELIGARD) 22.5 MG injection Inject 22.5 mg into the skin every 30 (thirty) days.      losartan (COZAAR) 100 MG tablet TAKE 1 TABLET BY MOUTH ONCE DAILY 90 tablet 3   tamsulosin (FLOMAX) 0.4 MG CAPS  capsule Take 1 capsule (0.4 mg total) by mouth daily. 30 capsule 0   traZODone (DESYREL) 50 MG tablet Take 0.5-1 tablets (25-50 mg total) by mouth at bedtime as needed for sleep. 30 tablet 3   No current facility-administered medications for this visit.     Allergies: No Known Allergies      Physical exam:     Blood pressure (!) 166/92, pulse 65, temperature 98.4 F (36.9 C), temperature source Oral, resp. rate 17, weight 186 lb 8 oz (84.6 kg), SpO2 100 %.        ECOG 0.      General appearance: Comfortable appearing without any discomfort Head: Normocephalic without any trauma Oropharynx: Mucous membranes are moist and pink without any thrush or ulcers. Eyes: Pupils are equal and round reactive to light. Lymph nodes: No cervical, supraclavicular, inguinal or axillary  lymphadenopathy.   Heart:regular rate and rhythm.  S1 and S2 without leg edema. Lung: Clear without any rhonchi or wheezes.  No dullness to percussion. Abdomin: Soft, nontender, nondistended with good bowel sounds.  No hepatosplenomegaly. Musculoskeletal: No joint deformity or effusion.  Full range of motion noted. Neurological: No deficits noted on motor, sensory and deep tendon reflex exam. Skin: No petechial rash or dryness.  Appeared moist.           PSA on 04/20/2015 was 2.6.  PSA on 07/19/2015 was 3.7. PSA on 10/18/2015 was 3.2. PSA on 01/11/2016 was 4.4. PSA on 04/12/2016 was 8.2. Lupron started in March 2018. PSA on 08/23/2016 was 0.7 PSA in August 2018 was 0.7. PSA in 12/2016 0.7.  Testosterone was less than 3. PSA on April 03, 2017 was 1.5. PSA on July 02, 2017 was 5.7. PSA on 10/02/2017 was 13.7 PSA 01/02/3018 3.9 PSA on April 03, 2018 was 3.4. PSA on April 22 of 2020 was 3.0. PSA on October 24, 2018 was 2.6 PSA on January 26, 2019 was 8.5. PSA on 04/29/2019 8.3 PSA on 07/30/2019 15.7 PSA on 11/02/2019 9.3 testosterone that is less than 3. PSA on February 02, 2020 was 8.3. PSA on May 04, 2020 was 7.5. PSA on 08/03/2020 was 8.8.     IMPRESSION: 1. Again demonstrated intense radiotracer activity within the RIGHT lobe of the prostate gland consistent with recurrent prostate carcinoma. 2. No evidence of metastatic adenopathy in the pelvis or periaortic retroperitoneum. 3. No visceral metastasis or skeletal metastasis. Impression and Plan:   80 year old man with  1.  Biochemically recurrent prostate cancer after initially diagnosed in 2009.    His disease status was updated including review of his PSA results as well as PSMA PET scan obtained on 08/15/2020.  His PSA continues to be elevated despite androgen deprivation therapy and his PET scan showed consistent localized disease in the right lobe of the prostate gland.  Treatment options moving forward  were discussed including continuing systemic therapy versus evaluation for local therapy given the persistent disease in the prostate.  These options including cryoablation versus radical prostatectomy versus repeat radiation.  I recommended reevaluation the patient in the prostate cancer multidisciplinary clinic in the near future for discussion.  After discussion today, he deferred the option for local therapy given the high risk associated with this approach citing his age and reasonable quality of life for now and would like to defer any additional therapy unless absolutely needed.  I think it is reasonable to continue with androgen deprivation therapy along and repeat pet imaging in the next 6 to 12 months especially if  his PSA starts to rise.   2.  Osteoporosis: He is currently on calcium and vitamin D supplements and deferred the option of Prolia.  3.  GI complaints: Unclear etiology at this time and unrelated to his prostate cancer.  4. Follow-up: He will continue monthly Eligard and MD follow-up in 3 months.  30  minutes were dedicated to this encounter.  Time was spent on reviewing imaging studies, laboratory data, treatment options and addressing complications noted to therapy.  Zola Button, MD 6/10/202211:19 AM

## 2020-08-19 NOTE — Patient Instructions (Signed)
Leuprolide depot injection What is this medication? LEUPROLIDE (loo PROE lide) is a man-made protein that acts like a natural hormone in the body. It decreases testosterone in men and decreases estrogen in women. In men, this medicine is used to treat advanced prostate cancer. In women, some forms of this medicine may be used to treat endometriosis, uterinefibroids, or other male hormone-related problems. This medicine may be used for other purposes; ask your health care provider orpharmacist if you have questions. COMMON BRAND NAME(S): Eligard, Fensolv, Lupron Depot, Lupron Depot-Ped, Viadur What should I tell my care team before I take this medication? They need to know if you have any of these conditions: diabetes heart disease or previous heart attack high blood pressure high cholesterol mental illness osteoporosis pain or difficulty passing urine seizures spinal cord metastasis stroke suicidal thoughts, plans, or attempt; a previous suicide attempt by you or a family member tobacco smoker unusual vaginal bleeding (women) an unusual or allergic reaction to leuprolide, benzyl alcohol, other medicines, foods, dyes, or preservatives pregnant or trying to get pregnant breast-feeding How should I use this medication? This medicine is for injection into a muscle or for injection under the skin. It is given by a health care professional in a hospital or clinic setting. The specific product will determine how it will be given to you. Make sure youunderstand which product you receive and how often you will receive it. Talk to your pediatrician regarding the use of this medicine in children.Special care may be needed. Overdosage: If you think you have taken too much of this medicine contact apoison control center or emergency room at once. NOTE: This medicine is only for you. Do not share this medicine with others. What if I miss a dose? It is important not to miss a dose. Call your doctor  or health careprofessional if you are unable to keep an appointment. Depot injections: Depot injections are given either once-monthly, every 12 weeks, every 16 weeks, or every 24 weeks depending on the product you are prescribed. The product you are prescribed will be based on if you are male orfemale, and your condition. Make sure you understand your product and dosing. What may interact with this medication? Do not take this medicine with any of the following medications: chasteberry cisapride dronedarone pimozide thioridazine This medicine may also interact with the following medications: herbal or dietary supplements, like black cohosh or DHEA male hormones, like estrogens or progestins and birth control pills, patches, rings, or injections male hormones, like testosterone other medicines that prolong the QT interval (abnormal heart rhythm) This list may not describe all possible interactions. Give your health care provider a list of all the medicines, herbs, non-prescription drugs, or dietary supplements you use. Also tell them if you smoke, drink alcohol, or use illegaldrugs. Some items may interact with your medicine. What should I watch for while using this medication? Visit your doctor or health care professional for regular checks on your progress. During the first weeks of treatment, your symptoms may get worse, but then will improve as you continue your treatment. You may get hot flashes, increased bone pain, increased difficulty passing urine, or an aggravation of nerve symptoms. Discuss these effects with your doctor or health careprofessional, some of them may improve with continued use of this medicine. Male patients may experience a menstrual cycle or spotting during the first months of therapy with this medicine. If this continues, contact your doctor orhealth care professional. This medicine may increase blood sugar. Ask  your healthcare provider if changesin diet or medicines  are needed if you have diabetes. What side effects may I notice from receiving this medication? Side effects that you should report to your doctor or health care professionalas soon as possible: allergic reactions like skin rash, itching or hives, swelling of the face, lips, or tongue breathing problems chest pain depression or memory disorders pain in your legs or groin pain at site where injected or implanted seizures severe headache signs and symptoms of high blood sugar such as being more thirsty or hungry or having to urinate more than normal. You may also feel very tired or have blurry vision swelling of the feet and legs suicidal thoughts or other mood changes visual changes vomiting Side effects that usually do not require medical attention (report to yourdoctor or health care professional if they continue or are bothersome): breast swelling or tenderness decrease in sex drive or performance diarrhea hot flashes loss of appetite muscle, joint, or bone pains nausea redness or irritation at site where injected or implanted skin problems or acne This list may not describe all possible side effects. Call your doctor for medical advice about side effects. You may report side effects to FDA at1-800-FDA-1088. Where should I keep my medication? This drug is given in a hospital or clinic and will not be stored at home. NOTE: This sheet is a summary. It may not cover all possible information. If you have questions about this medicine, talk to your doctor, pharmacist, orhealth care provider.  2022 Elsevier/Gold Standard (2019-01-28 10:35:13)

## 2020-08-21 LAB — GI PROFILE, STOOL, PCR

## 2020-08-23 NOTE — Telephone Encounter (Signed)
Please ignore previous message... I sent to you instead of patient. I have now sent to Pt's MyChart in reply.

## 2020-08-30 ENCOUNTER — Encounter: Payer: Self-pay | Admitting: Oncology

## 2020-09-06 ENCOUNTER — Other Ambulatory Visit: Payer: Self-pay | Admitting: Family Medicine

## 2020-09-07 ENCOUNTER — Encounter: Payer: Self-pay | Admitting: Oncology

## 2020-09-07 MED ORDER — METFORMIN HCL ER 500 MG PO TB24: 1000.0000 mg | ORAL_TABLET | Freq: Every day | ORAL | 1 refills | Status: DC

## 2020-09-08 ENCOUNTER — Telehealth: Payer: Self-pay | Admitting: Oncology

## 2020-09-08 NOTE — Telephone Encounter (Signed)
R/s appt per 6/30 sch msg. Pt aware.  

## 2020-09-16 ENCOUNTER — Other Ambulatory Visit: Payer: Self-pay

## 2020-09-16 ENCOUNTER — Inpatient Hospital Stay: Payer: Medicare PPO | Attending: Oncology

## 2020-09-16 ENCOUNTER — Encounter: Payer: Self-pay | Admitting: Oncology

## 2020-09-16 VITALS — BP 180/74 | HR 66 | Temp 98.4°F | Resp 18

## 2020-09-16 DIAGNOSIS — Z79899 Other long term (current) drug therapy: Secondary | ICD-10-CM | POA: Diagnosis not present

## 2020-09-16 DIAGNOSIS — C61 Malignant neoplasm of prostate: Secondary | ICD-10-CM | POA: Diagnosis not present

## 2020-09-16 DIAGNOSIS — Z5111 Encounter for antineoplastic chemotherapy: Secondary | ICD-10-CM | POA: Insufficient documentation

## 2020-09-16 MED ORDER — LEUPROLIDE ACETATE 7.5 MG ~~LOC~~ KIT
7.5000 mg | PACK | Freq: Once | SUBCUTANEOUS | Status: AC
  Administered 2020-09-16: 7.5 mg via SUBCUTANEOUS
  Filled 2020-09-16: qty 7.5

## 2020-09-16 NOTE — Patient Instructions (Signed)
Leuprolide depot injection What is this medication? LEUPROLIDE (loo PROE lide) is a man-made protein that acts like a natural hormone in the body. It decreases testosterone in men and decreases estrogen in women. In men, this medicine is used to treat advanced prostate cancer. In women, some forms of this medicine may be used to treat endometriosis, uterinefibroids, or other male hormone-related problems. This medicine may be used for other purposes; ask your health care provider orpharmacist if you have questions. COMMON BRAND NAME(S): Eligard, Fensolv, Lupron Depot, Lupron Depot-Ped, Viadur What should I tell my care team before I take this medication? They need to know if you have any of these conditions: diabetes heart disease or previous heart attack high blood pressure high cholesterol mental illness osteoporosis pain or difficulty passing urine seizures spinal cord metastasis stroke suicidal thoughts, plans, or attempt; a previous suicide attempt by you or a family member tobacco smoker unusual vaginal bleeding (women) an unusual or allergic reaction to leuprolide, benzyl alcohol, other medicines, foods, dyes, or preservatives pregnant or trying to get pregnant breast-feeding How should I use this medication? This medicine is for injection into a muscle or for injection under the skin. It is given by a health care professional in a hospital or clinic setting. The specific product will determine how it will be given to you. Make sure youunderstand which product you receive and how often you will receive it. Talk to your pediatrician regarding the use of this medicine in children.Special care may be needed. Overdosage: If you think you have taken too much of this medicine contact apoison control center or emergency room at once. NOTE: This medicine is only for you. Do not share this medicine with others. What if I miss a dose? It is important not to miss a dose. Call your doctor  or health careprofessional if you are unable to keep an appointment. Depot injections: Depot injections are given either once-monthly, every 12 weeks, every 16 weeks, or every 24 weeks depending on the product you are prescribed. The product you are prescribed will be based on if you are male orfemale, and your condition. Make sure you understand your product and dosing. What may interact with this medication? Do not take this medicine with any of the following medications: chasteberry cisapride dronedarone pimozide thioridazine This medicine may also interact with the following medications: herbal or dietary supplements, like black cohosh or DHEA male hormones, like estrogens or progestins and birth control pills, patches, rings, or injections male hormones, like testosterone other medicines that prolong the QT interval (abnormal heart rhythm) This list may not describe all possible interactions. Give your health care provider a list of all the medicines, herbs, non-prescription drugs, or dietary supplements you use. Also tell them if you smoke, drink alcohol, or use illegaldrugs. Some items may interact with your medicine. What should I watch for while using this medication? Visit your doctor or health care professional for regular checks on your progress. During the first weeks of treatment, your symptoms may get worse, but then will improve as you continue your treatment. You may get hot flashes, increased bone pain, increased difficulty passing urine, or an aggravation of nerve symptoms. Discuss these effects with your doctor or health careprofessional, some of them may improve with continued use of this medicine. Male patients may experience a menstrual cycle or spotting during the first months of therapy with this medicine. If this continues, contact your doctor orhealth care professional. This medicine may increase blood sugar. Ask  your healthcare provider if changesin diet or medicines  are needed if you have diabetes. What side effects may I notice from receiving this medication? Side effects that you should report to your doctor or health care professionalas soon as possible: allergic reactions like skin rash, itching or hives, swelling of the face, lips, or tongue breathing problems chest pain depression or memory disorders pain in your legs or groin pain at site where injected or implanted seizures severe headache signs and symptoms of high blood sugar such as being more thirsty or hungry or having to urinate more than normal. You may also feel very tired or have blurry vision swelling of the feet and legs suicidal thoughts or other mood changes visual changes vomiting Side effects that usually do not require medical attention (report to yourdoctor or health care professional if they continue or are bothersome): breast swelling or tenderness decrease in sex drive or performance diarrhea hot flashes loss of appetite muscle, joint, or bone pains nausea redness or irritation at site where injected or implanted skin problems or acne This list may not describe all possible side effects. Call your doctor for medical advice about side effects. You may report side effects to FDA at1-800-FDA-1088. Where should I keep my medication? This drug is given in a hospital or clinic and will not be stored at home. NOTE: This sheet is a summary. It may not cover all possible information. If you have questions about this medicine, talk to your doctor, pharmacist, orhealth care provider.  2022 Elsevier/Gold Standard (2019-01-28 10:35:13)

## 2020-09-21 DIAGNOSIS — M170 Bilateral primary osteoarthritis of knee: Secondary | ICD-10-CM | POA: Diagnosis not present

## 2020-09-23 ENCOUNTER — Other Ambulatory Visit: Payer: Self-pay

## 2020-09-23 DIAGNOSIS — R7989 Other specified abnormal findings of blood chemistry: Secondary | ICD-10-CM

## 2020-09-27 ENCOUNTER — Other Ambulatory Visit: Payer: Self-pay

## 2020-09-27 ENCOUNTER — Other Ambulatory Visit (INDEPENDENT_AMBULATORY_CARE_PROVIDER_SITE_OTHER): Payer: Medicare PPO

## 2020-09-27 ENCOUNTER — Telehealth: Payer: Self-pay | Admitting: Family Medicine

## 2020-09-27 DIAGNOSIS — Z1159 Encounter for screening for other viral diseases: Secondary | ICD-10-CM

## 2020-09-27 DIAGNOSIS — E1140 Type 2 diabetes mellitus with diabetic neuropathy, unspecified: Secondary | ICD-10-CM | POA: Diagnosis not present

## 2020-09-27 LAB — COMPREHENSIVE METABOLIC PANEL
ALT: 24 U/L (ref 0–53)
AST: 16 U/L (ref 0–37)
Albumin: 4.1 g/dL (ref 3.5–5.2)
Alkaline Phosphatase: 44 U/L (ref 39–117)
BUN: 18 mg/dL (ref 6–23)
CO2: 29 mEq/L (ref 19–32)
Calcium: 9.4 mg/dL (ref 8.4–10.5)
Chloride: 104 mEq/L (ref 96–112)
Creatinine, Ser: 0.86 mg/dL (ref 0.40–1.50)
GFR: 82.28 mL/min (ref >60.00)
Glucose, Bld: 204 mg/dL — ABNORMAL HIGH (ref 70–99)
Potassium: 4.5 mEq/L (ref 3.5–5.1)
Sodium: 141 mEq/L (ref 135–145)
Total Bilirubin: 0.7 mg/dL (ref 0.2–1.2)
Total Protein: 6.3 g/dL (ref 6.0–8.3)

## 2020-09-27 LAB — HEMOGLOBIN A1C: Hgb A1c MFr Bld: 8.1 % — ABNORMAL HIGH (ref 4.6–6.5)

## 2020-09-27 LAB — LIPID PANEL
Cholesterol: 131 mg/dL (ref 0–200)
HDL: 48.6 mg/dL
LDL Cholesterol: 69 mg/dL (ref 0–99)
NonHDL: 82.45
Total CHOL/HDL Ratio: 3
Triglycerides: 68 mg/dL (ref 0.0–149.0)
VLDL: 13.6 mg/dL (ref 0.0–40.0)

## 2020-09-27 NOTE — Telephone Encounter (Signed)
-----   Message from Ellamae Sia sent at 09/15/2020  3:36 PM EDT ----- Regarding: Lab orders for Tuesday, 7.19.22 Patient is scheduled for CPX labs, please order future labs, Thanks , Karna Christmas

## 2020-09-27 NOTE — Progress Notes (Signed)
No critical labs need to be addressed urgently. We will discuss labs in detail at upcoming office visit.   

## 2020-09-28 LAB — HEPATITIS C ANTIBODY
Hepatitis C Ab: NONREACTIVE
SIGNAL TO CUT-OFF: 0.01 (ref <1.00)

## 2020-09-28 NOTE — Progress Notes (Signed)
No critical labs need to be addressed urgently. We will discuss labs in detail at upcoming office visit.   

## 2020-09-30 MED ORDER — AMLODIPINE BESYLATE 5 MG PO TABS: 5.0000 mg | ORAL_TABLET | Freq: Every day | ORAL | 11 refills | Status: DC

## 2020-10-04 ENCOUNTER — Encounter: Payer: Medicare PPO | Admitting: Family Medicine

## 2020-10-14 ENCOUNTER — Ambulatory Visit: Payer: Medicare PPO

## 2020-10-24 LAB — HM DIABETES FOOT EXAM

## 2020-10-25 ENCOUNTER — Encounter: Payer: Self-pay | Admitting: Oncology

## 2020-10-26 ENCOUNTER — Inpatient Hospital Stay: Payer: Medicare PPO | Attending: Oncology

## 2020-10-26 ENCOUNTER — Other Ambulatory Visit: Payer: Self-pay

## 2020-10-26 VITALS — BP 166/87 | HR 77 | Temp 98.5°F | Resp 18

## 2020-10-26 DIAGNOSIS — Z79899 Other long term (current) drug therapy: Secondary | ICD-10-CM | POA: Insufficient documentation

## 2020-10-26 DIAGNOSIS — C61 Malignant neoplasm of prostate: Secondary | ICD-10-CM | POA: Insufficient documentation

## 2020-10-26 DIAGNOSIS — Z5111 Encounter for antineoplastic chemotherapy: Secondary | ICD-10-CM | POA: Insufficient documentation

## 2020-10-26 MED ORDER — LEUPROLIDE ACETATE 7.5 MG ~~LOC~~ KIT
7.5000 mg | PACK | Freq: Once | SUBCUTANEOUS | Status: AC
  Administered 2020-10-26: 7.5 mg via SUBCUTANEOUS
  Filled 2020-10-26: qty 7.5

## 2020-10-26 NOTE — Patient Instructions (Signed)
Leuprolide depot injection What is this medication? LEUPROLIDE (loo PROE lide) is a man-made protein that acts like a natural hormone in the body. It decreases testosterone in men and decreases estrogen in women. In men, this medicine is used to treat advanced prostate cancer. In women, some forms of this medicine may be used to treat endometriosis, uterinefibroids, or other male hormone-related problems. This medicine may be used for other purposes; ask your health care provider orpharmacist if you have questions. COMMON BRAND NAME(S): Eligard, Fensolv, Lupron Depot, Lupron Depot-Ped, Viadur What should I tell my care team before I take this medication? They need to know if you have any of these conditions: diabetes heart disease or previous heart attack high blood pressure high cholesterol mental illness osteoporosis pain or difficulty passing urine seizures spinal cord metastasis stroke suicidal thoughts, plans, or attempt; a previous suicide attempt by you or a family member tobacco smoker unusual vaginal bleeding (women) an unusual or allergic reaction to leuprolide, benzyl alcohol, other medicines, foods, dyes, or preservatives pregnant or trying to get pregnant breast-feeding How should I use this medication? This medicine is for injection into a muscle or for injection under the skin. It is given by a health care professional in a hospital or clinic setting. The specific product will determine how it will be given to you. Make sure youunderstand which product you receive and how often you will receive it. Talk to your pediatrician regarding the use of this medicine in children.Special care may be needed. Overdosage: If you think you have taken too much of this medicine contact apoison control center or emergency room at once. NOTE: This medicine is only for you. Do not share this medicine with others. What if I miss a dose? It is important not to miss a dose. Call your doctor  or health careprofessional if you are unable to keep an appointment. Depot injections: Depot injections are given either once-monthly, every 12 weeks, every 16 weeks, or every 24 weeks depending on the product you are prescribed. The product you are prescribed will be based on if you are male orfemale, and your condition. Make sure you understand your product and dosing. What may interact with this medication? Do not take this medicine with any of the following medications: chasteberry cisapride dronedarone pimozide thioridazine This medicine may also interact with the following medications: herbal or dietary supplements, like black cohosh or DHEA male hormones, like estrogens or progestins and birth control pills, patches, rings, or injections male hormones, like testosterone other medicines that prolong the QT interval (abnormal heart rhythm) This list may not describe all possible interactions. Give your health care provider a list of all the medicines, herbs, non-prescription drugs, or dietary supplements you use. Also tell them if you smoke, drink alcohol, or use illegaldrugs. Some items may interact with your medicine. What should I watch for while using this medication? Visit your doctor or health care professional for regular checks on your progress. During the first weeks of treatment, your symptoms may get worse, but then will improve as you continue your treatment. You may get hot flashes, increased bone pain, increased difficulty passing urine, or an aggravation of nerve symptoms. Discuss these effects with your doctor or health careprofessional, some of them may improve with continued use of this medicine. Male patients may experience a menstrual cycle or spotting during the first months of therapy with this medicine. If this continues, contact your doctor orhealth care professional. This medicine may increase blood sugar. Ask  your healthcare provider if changesin diet or medicines  are needed if you have diabetes. What side effects may I notice from receiving this medication? Side effects that you should report to your doctor or health care professionalas soon as possible: allergic reactions like skin rash, itching or hives, swelling of the face, lips, or tongue breathing problems chest pain depression or memory disorders pain in your legs or groin pain at site where injected or implanted seizures severe headache signs and symptoms of high blood sugar such as being more thirsty or hungry or having to urinate more than normal. You may also feel very tired or have blurry vision swelling of the feet and legs suicidal thoughts or other mood changes visual changes vomiting Side effects that usually do not require medical attention (report to yourdoctor or health care professional if they continue or are bothersome): breast swelling or tenderness decrease in sex drive or performance diarrhea hot flashes loss of appetite muscle, joint, or bone pains nausea redness or irritation at site where injected or implanted skin problems or acne This list may not describe all possible side effects. Call your doctor for medical advice about side effects. You may report side effects to FDA at1-800-FDA-1088. Where should I keep my medication? This drug is given in a hospital or clinic and will not be stored at home. NOTE: This sheet is a summary. It may not cover all possible information. If you have questions about this medicine, talk to your doctor, pharmacist, orhealth care provider.  2022 Elsevier/Gold Standard (2019-01-28 10:35:13)

## 2020-10-27 NOTE — Telephone Encounter (Signed)
ATC patient, no answer. LMTCB

## 2020-10-27 NOTE — Telephone Encounter (Signed)
Lauren, pls contact patient and let him know since his sx persist despite a trying probiotics, dicyclomine, ibgard, gas x. famotidine and CTAP was unrevealing and fairly recent colonoscopy was ok, I think it would be best to have Dr. Loletha Carrow check in. Please schedule Alexander Yang for a follow up appointment with Dr. Loletha Carrow. THX

## 2020-10-28 ENCOUNTER — Telehealth: Payer: Self-pay | Admitting: *Deleted

## 2020-10-28 NOTE — Telephone Encounter (Signed)
Spoke with patient, patient has been scheduled for appt with Dr Loletha Carrow on 11/10/20 at 2:20pm. Patient verbalized understanding and nothing further needed at this time.

## 2020-11-01 ENCOUNTER — Ambulatory Visit (INDEPENDENT_AMBULATORY_CARE_PROVIDER_SITE_OTHER): Payer: Medicare PPO | Admitting: Family Medicine

## 2020-11-01 ENCOUNTER — Other Ambulatory Visit: Payer: Self-pay

## 2020-11-01 ENCOUNTER — Encounter: Payer: Self-pay | Admitting: Family Medicine

## 2020-11-01 ENCOUNTER — Telehealth: Payer: Self-pay | Admitting: *Deleted

## 2020-11-01 VITALS — BP 150/70 | HR 79 | Temp 98.0°F | Ht 70.75 in | Wt 186.5 lb

## 2020-11-01 DIAGNOSIS — C61 Malignant neoplasm of prostate: Secondary | ICD-10-CM | POA: Diagnosis not present

## 2020-11-01 DIAGNOSIS — Z0000 Encounter for general adult medical examination without abnormal findings: Secondary | ICD-10-CM | POA: Diagnosis not present

## 2020-11-01 DIAGNOSIS — I700 Atherosclerosis of aorta: Secondary | ICD-10-CM

## 2020-11-01 DIAGNOSIS — I152 Hypertension secondary to endocrine disorders: Secondary | ICD-10-CM

## 2020-11-01 DIAGNOSIS — E1142 Type 2 diabetes mellitus with diabetic polyneuropathy: Secondary | ICD-10-CM | POA: Diagnosis not present

## 2020-11-01 DIAGNOSIS — E785 Hyperlipidemia, unspecified: Secondary | ICD-10-CM

## 2020-11-01 DIAGNOSIS — E1169 Type 2 diabetes mellitus with other specified complication: Secondary | ICD-10-CM

## 2020-11-01 DIAGNOSIS — E1140 Type 2 diabetes mellitus with diabetic neuropathy, unspecified: Secondary | ICD-10-CM

## 2020-11-01 DIAGNOSIS — E1159 Type 2 diabetes mellitus with other circulatory complications: Secondary | ICD-10-CM

## 2020-11-01 MED ORDER — LANTUS SOLOSTAR 100 UNIT/ML ~~LOC~~ SOPN: 10.0000 [IU] | PEN_INJECTOR | Freq: Every day | SUBCUTANEOUS | 11 refills | Status: DC

## 2020-11-01 MED ORDER — PEN NEEDLES 32G X 4 MM MISC: 3 refills | Status: DC

## 2020-11-01 NOTE — Assessment & Plan Note (Signed)
Poor control. Stop metfomrin as may be worsening IBS , stop glipizide as minimally affective.   Start insulin long acting.Marland Kitchen LAntus.  Titrate up 2 units every 2 days until fasting CBGs at goal < 120.

## 2020-11-01 NOTE — Telephone Encounter (Signed)
Prescription sent in for pen needles 32 g x 4 mm to [facility].  Chris notified by telephone that Rx has been sent.

## 2020-11-01 NOTE — Assessment & Plan Note (Signed)
On statin. LDL goal < 70.

## 2020-11-01 NOTE — Telephone Encounter (Signed)
Alexander Yang with [facility] left a voicemail stating that they received a script for the Lantus Solarstar but did not receive the pen needles needed. Alexander Yang stated that the patient is there now and he plans on dispensing pen needles 32 G -4 m. Alexander Yang requested a call back regarding this.

## 2020-11-01 NOTE — Assessment & Plan Note (Signed)
Stable, chronic.  Continue current medication.   At goal on atorvastatin 40 mg daily

## 2020-11-01 NOTE — Progress Notes (Signed)
Patient ID: Alexander Yang, male    DOB: [birth year], 80 y.o.   MRN: [id number]  This visit was conducted in person.  BP (!) 150/70   Pulse 79   Temp 98 F (36.7 C) (Temporal)   Ht 5' 10.75" (1.797 m)   Wt 186 lb 8 oz (84.6 kg)   SpO2 97%   BMI 26.20 kg/m    CC: Chief Complaint  Patient presents with   Annual Exam    Part 2-Saw Calandra 05/25/20    Subjective:   HPI: Alexander Yang is a 80 y.o. male presenting on 11/01/2020 for Annual Exam (Part 2-Saw Calandra 05/25/20)  The patient presents for  complete physical and review of chronic health problems. He/She also has the following acute concerns today: intermittent upset stomach, nausea off an in last year.  Every 2 weeks he has some explosive diarrhea.  Saw  GI NP  earlier in the year.. dicyclomine did not help much.    The patient saw a LPN or RN for medicare wellness visit.  Prevention and wellness was reviewed in detail. Note reviewed and important notes copied below.  Prostate cancer followed by Urology   Diabetes:  A1C shows worsened control in last 6 months compared to 7.2 Despite metformin 500 mg 2 tabs daily XR, glipizide max  Farxiga not tolerated given nausea SE to trulicity in past, severe. Lab Results  Component Value Date   HGBA1C 8.1 (H) 09/27/2020  Using medications without difficulties: Hypoglycemic episodes: Hyperglycemic episodes:yes Feet problems: peripheral neuropathy Blood Sugars averaging: FBS 135-204 eye exam within last year:  Aortic atherosclerosis  Hypertension:    Not at goal in office today on losartan 100 mg daily and amlodipine  5 mg daily. At goal at home. BP Readings from Last 3 Encounters:  11/01/20 (!) 150/70  10/26/20 (!) 166/87  09/16/20 (!) 180/74  Using medication without problems or lightheadedness: none Chest pain with exertion:none Edema:   some initially Short of breath: Average home BPs: at home 137/76 Other issues:  Elevated Cholesterol:  At goal on  atorvastatin 40 mg daily Lab Results  Component Value Date   CHOL 131 09/27/2020   HDL 48.60 09/27/2020   LDLCALC 69 09/27/2020   TRIG 68.0 09/27/2020   CHOLHDL 3 09/27/2020  Using medications without problems: Muscle aches:  Diet compliance: Exercise: Other complaints:      Relevant past medical, surgical, family and social history reviewed and updated as indicated. Interim medical history since our last visit reviewed. Allergies and medications reviewed and updated. Outpatient Medications Prior to Visit  Medication Sig Dispense Refill   ACCU-CHEK AVIVA PLUS test strip USE TO CHECK BLOOD SUGAR 2 TIMES A DAY. DX: E11.49 100 strip 11   amLODipine (NORVASC) 5 MG tablet Take 1 tablet (5 mg total) by mouth daily. 30 tablet 11   aspirin 81 MG tablet Take 81 mg by mouth every other day.     atorvastatin (LIPITOR) 40 MG tablet TAKE 1 TABLET BY MOUTH ONCE DAILY 90 tablet 0   Blood Glucose Monitoring Suppl (ACCU-CHEK AVIVA PLUS) w/Device KIT Use to check blood sugar 2 times a day.  Dx: E11.49 1 kit 0   CALCIUM-VITAMIN D PO Take 1 tablet by mouth daily.     dicyclomine (BENTYL) 10 MG capsule Take 1 capsule (10 mg total) by mouth 2 (two) times daily as needed for spasms. 180 capsule 0   famotidine (PEPCID) 20 MG tablet Take 1 tablet (20 mg total) by mouth at  bedtime. 30 tablet 3   glipiZIDE (GLUCOTROL XL) 10 MG 24 hr tablet Take 1 tablet (10 mg total) by mouth daily with breakfast. 30 tablet 11   ketoconazole (NIZORAL) 2 % cream Apply 1 application topically daily as needed for irritation.     Lancets (ACCU-CHEK SOFT TOUCH) lancets Use to check blood sugar 2 times a day.  Dx: E11.49 100 each 11   Lancets Misc. (ACCU-CHEK SOFTCLIX LANCET DEV) KIT Use to check blood sugar 2 times a day.  Dx: E11.49 1 kit 0   Leuprolide Acetate, 3 Month, (ELIGARD) 22.5 MG injection Inject 22.5 mg into the skin every 30 (thirty) days.      losartan (COZAAR) 100 MG tablet TAKE 1 TABLET BY MOUTH ONCE DAILY 90 tablet  3   metFORMIN (GLUCOPHAGE-XR) 500 MG 24 hr tablet Take 2 tablets (1,000 mg total) by mouth daily with breakfast. 180 tablet 1   traZODone (DESYREL) 50 MG tablet Take 0.5-1 tablets (25-50 mg total) by mouth at bedtime as needed for sleep. 30 tablet 3   No facility-administered medications prior to visit.     Per HPI unless specifically indicated in ROS section below Review of Systems  Constitutional:  Negative for fatigue and fever.  HENT:  Negative for ear pain.   Eyes:  Negative for pain.  Respiratory:  Negative for cough and shortness of breath.   Cardiovascular:  Negative for chest pain, palpitations and leg swelling.  Gastrointestinal:  Positive for abdominal distention and abdominal pain.  Genitourinary:  Negative for dysuria.  Musculoskeletal:  Negative for arthralgias.  Neurological:  Negative for syncope, light-headedness and headaches.  Psychiatric/Behavioral:  Negative for dysphoric mood.   Objective:  BP (!) 150/70   Pulse 79   Temp 98 F (36.7 C) (Temporal)   Ht 5' 10.75" (1.797 m)   Wt 186 lb 8 oz (84.6 kg)   SpO2 97%   BMI 26.20 kg/m   Wt Readings from Last 3 Encounters:  11/01/20 186 lb 8 oz (84.6 kg)  08/19/20 186 lb 8 oz (84.6 kg)  06/27/20 185 lb (83.9 kg)      Physical Exam Constitutional:      General: He is not in acute distress.    Appearance: Normal appearance. He is well-developed. He is not ill-appearing or toxic-appearing.  HENT:     Head: Normocephalic and atraumatic.     Right Ear: Hearing, tympanic membrane, ear canal and external ear normal.     Left Ear: Hearing, tympanic membrane, ear canal and external ear normal.     Nose: Nose normal.     Mouth/Throat:     Pharynx: Uvula midline.  Eyes:     General: Lids are normal. Lids are everted, no foreign bodies appreciated.     Conjunctiva/sclera: Conjunctivae normal.     Pupils: Pupils are equal, round, and reactive to light.  Neck:     Thyroid: No thyroid mass or thyromegaly.      Vascular: No carotid bruit.     Trachea: Trachea and phonation normal.  Cardiovascular:     Rate and Rhythm: Normal rate and regular rhythm.     Pulses: Normal pulses.     Heart sounds: S1 normal and S2 normal. No murmur heard.   No gallop.  Pulmonary:     Breath sounds: Normal breath sounds. No wheezing, rhonchi or rales.  Abdominal:     General: Bowel sounds are normal.     Palpations: Abdomen is soft.     Tenderness:  There is no abdominal tenderness. There is no guarding or rebound.     Hernia: No hernia is present.  Musculoskeletal:     Cervical back: Normal range of motion and neck supple.  Lymphadenopathy:     Cervical: No cervical adenopathy.  Skin:    General: Skin is warm and dry.     Findings: No rash.  Neurological:     Mental Status: He is alert.     Cranial Nerves: No cranial nerve deficit.     Sensory: No sensory deficit.     Gait: Gait normal.     Deep Tendon Reflexes: Reflexes are normal and symmetric.  Psychiatric:        Speech: Speech normal.        Behavior: Behavior normal.        Judgment: Judgment normal.      Diabetic foot exam: Normal inspection No skin breakdown No calluses  Normal DP pulses Normal sensation to light touch and monofilament Nails normal  Results for orders placed or performed in visit on 09/27/20  Hepatitis C antibody  Result Value Ref Range   Hepatitis C Ab NON-REACTIVE NON-REACTIVE   SIGNAL TO CUT-OFF 0.01 <1.00  Comprehensive metabolic panel  Result Value Ref Range   Sodium 141 135 - 145 mEq/L   Potassium 4.5 3.5 - 5.1 mEq/L   Chloride 104 96 - 112 mEq/L   CO2 29 19 - 32 mEq/L   Glucose, Bld 204 (H) 70 - 99 mg/dL   BUN 18 6 - 23 mg/dL   Creatinine, Ser 0.86 0.40 - 1.50 mg/dL   Total Bilirubin 0.7 0.2 - 1.2 mg/dL   Alkaline Phosphatase 44 39 - 117 U/L   AST 16 0 - 37 U/L   ALT 24 0 - 53 U/L   Total Protein 6.3 6.0 - 8.3 g/dL   Albumin 4.1 3.5 - 5.2 g/dL   GFR 82.28 >60.00 mL/min   Calcium 9.4 8.4 - 10.5 mg/dL   Lipid panel  Result Value Ref Range   Cholesterol 131 0 - 200 mg/dL   Triglycerides 68.0 0.0 - 149.0 mg/dL   HDL 48.60 >39.00 mg/dL   VLDL 13.6 0.0 - 40.0 mg/dL   LDL Cholesterol 69 0 - 99 mg/dL   Total CHOL/HDL Ratio 3    NonHDL 82.45   Hemoglobin A1c  Result Value Ref Range   Hgb A1c MFr Bld 8.1 (H) 4.6 - 6.5 %    This visit occurred during the SARS-CoV-2 public health emergency.  Safety protocols were in place, including screening questions prior to the visit, additional usage of staff PPE, and extensive cleaning of exam room while observing appropriate contact time as indicated for disinfecting solutions.   COVID 19 screen:  No recent travel or known exposure to COVID19 The patient denies respiratory symptoms of COVID 19 at this time. The importance of social distancing was discussed today.   Assessment and Plan The patient's preventative maintenance and recommended screening tests for an annual wellness exam were reviewed in full today. Brought up to date unless services declined.  Counselled on the importance of diet, exercise, and its role in overall health and mortality. The patient's FH and SH was reviewed, including their home life, tobacco status, and drug and alcohol status.     Vaccines: uptodate, consider Tdap, COVID x 4 Colon:  2019 negative, no further indicated   Prostate cancer: on lupron  Problem List Items Addressed This Visit     Aortic atherosclerosis ([place]) (Chronic)  On statin. LDL goal < 70.      Diabetes mellitus with neurological manifestations, controlled ([place]) (Chronic)    Poor control. Stop metfomrin as may be worsening IBS , stop glipizide as minimally affective.   Start insulin long acting.Marland Kitchen LAntus.  Titrate up 2 units every 2 days until fasting CBGs at goal < 120.      Relevant Medications   insulin glargine (LANTUS SOLOSTAR) 100 UNIT/ML Solostar Pen   Diabetic peripheral neuropathy associated with type 2 diabetes mellitus ([place])  (Chronic)    Due to DM. Need better control of A1C.      Relevant Medications   insulin glargine (LANTUS SOLOSTAR) 100 UNIT/ML Solostar Pen   Hyperlipidemia associated with type 2 diabetes mellitus ([facility]) (Chronic)    Stable, chronic.  Continue current medication.   At goal on atorvastatin 40 mg daily      Relevant Medications   insulin glargine (LANTUS SOLOSTAR) 100 UNIT/ML Solostar Pen   Hypertension associated with diabetes ([facility]) (Chronic)    Stable, chronic.  Continue current medication.   At goal on atorvastatin 40 mg daily      Relevant Medications   insulin glargine (LANTUS SOLOSTAR) 100 UNIT/ML Solostar Pen   Prostate cancer ([facility]) (Chronic)    Active. Treated with lupron, followed by urology/oncology.      Other Visit Diagnoses     Routine general medical examination at a health care facility    -  Primary        Eliezer Lofts, MD

## 2020-11-01 NOTE — Assessment & Plan Note (Signed)
Active. Treated with lupron, followed by urology/oncology.

## 2020-11-01 NOTE — Patient Instructions (Addendum)
Stop metformin and glipizide.  Start Lantus injections daily.  Start  10 unit daily, if blood sugar is not at goal  < 120 after 2-3 days then increase insulin dose by 2 units.   Hold insulin if blood sugar < 60 and take 3 glucose tablets ( keep on hand) and recheck again 15 minutes later.. call office to notify.  Do not skip meals. Insulin Glargine Injection What is this medication? INSULIN GLARGINE (IN su lin GLAR geen) treats diabetes. It works by increasing insulin levels in your body, which decreases your blood sugar (glucose). It belongs to a group of medications called long-acting insulins or basalinsulins. Changes to diet and exercise are often combined with this medication. This medicine may be used for other purposes; ask your health care provider orpharmacist if you have questions. COMMON BRAND NAME(S): BASAGLAR, Lantus, Lantus SoloStar, Semglee, Toujeo MaxSoloStar, [employer] What should I tell my care team before I take this medication? They need to know if you have any of these conditions: Episodes of low blood sugar Eye disease, vision problems Kidney disease Liver disease An unusual or allergic reaction to insulin, metacresol, other medications, foods, dyes, or preservatives Pregnant or trying to get pregnant Breast-feeding How should I use this medication? This medication is for injection under the skin. Use this medication at the same time each day. Use exactly as directed. This insulin should never be mixed in the same syringe with other insulins before injection. Do not vigorously shake before use. You will be taught how to use this medication and how to adjust doses for activities and illness. Do not use more insulin thanprescribed. Always check the appearance of your insulin before using it. This medication should be clear and colorless like water. Do not use it if it is cloudy,thickened, colored, or has solid particles in it. If you use an insulin pen, be sure to  take off the outer needle cover beforeusing the dose. It is important that you put your used needles and syringes in a special sharps container. Do not put them in a trash can. If you do not have a sharpscontainer, call your pharmacist or care team to get one. This medication comes with INSTRUCTIONS FOR USE. Ask your pharmacist for directions on how to use this medication. Read the information carefully. Talkto your pharmacist or care team if you have questions. Talk to your care team regarding the use of this medication in children. While this medication may be prescribed for children as young as 6 years for selectedconditions, precautions do apply. Overdosage: If you think you have taken too much of this medicine contact apoison control center or emergency room at once. NOTE: This medicine is only for you. Do not share this medicine with others. What if I miss a dose? It is important not to miss a dose. Your care team should discuss a plan for missed doses with you. If you do miss a dose, follow their plan. Do not takedouble doses. What may interact with this medication? Alcohol containing beverages Antiviral medications for HIV or AIDS Aspirin and aspirin-like medications Beta-blockers like atenolol, metoprolol, propranolol Certain medications for blood pressure, heart disease, irregular heart beat Chromium Clonidine Diuretics Male hormones, such as estrogens or progestins, birth control pills Fenofibrate Gemfibrozil Guanethidine Isoniazid Lanreotide Male hormones or anabolic steroids MAOIs like Carbex, Eldepryl, Marplan, Nardil, and Parnate Medications for weight loss Medications for allergies, asthma, cold, or cough Medications for depression, anxiety, or psychotic disturbances Niacin Nicotine NSAIDs, medications for pain  and inflammation, like ibuprofen or naproxen Octreotide Other medications for diabetes, like glyburide, glipizide, or  glimepiride Pasireotide Pentamidine Phenytoin Probenecid Quinolone antibiotics such as ciprofloxacin, levofloxacin, ofloxacin Reserpine Some herbal dietary supplements Steroid medications such as prednisone or cortisone Sulfamethoxazole; trimethoprim Thyroid hormones This list may not describe all possible interactions. Give your health care provider a list of all the medicines, herbs, non-prescription drugs, or dietary supplements you use. Also tell them if you smoke, drink alcohol, or use illegaldrugs. Some items may interact with your medicine. What should I watch for while using this medication? Visit your care team for regular checks on your progress. Do not drive, use machinery, or do anything that needs mental alertness until you know how this medication affects you. Alcohol may interfere with the effectof this medication. Avoid alcoholic drinks. A test called the HbA1C (A1C) will be monitored. This is a simple blood test. It measures your blood sugar control over the last 2 to 3 months. You willreceive this test every 3 to 6 months. Learn how to check your blood sugar. Learn the symptoms of low and high bloodsugar and how to manage them. Always carry a quick-source of sugar with you in case you have symptoms of low blood sugar. Examples include hard sugar candy or glucose tablets. Make sure others know that you can choke if you eat or drink when you develop serious symptoms of low blood sugar, such as seizures or unconsciousness. They must getmedical help at once. Tell your care team if you have high blood sugar. You might need to change the dose of your medication. If you are sick or exercising more than usual, youmight need to change the dose of your medication. Do not skip meals. Ask your care team if you should avoid alcohol. Many nonprescription cough and cold products contain sugar or alcohol. These canaffect blood sugar. Make sure that you have the right kind of syringe for the  type of insulin you use. Try not to change the brand and type of insulin or syringe unless your care team tells you to. Switching insulin brand or type can cause dangerously high or low blood sugar. Always keep an extra supply of insulin, syringes, and needles on hand. Use a syringe one time only. Throw away syringe and needle ina closed container to prevent accidental needle sticks. Insulin pens and cartridges should never be shared. Even if the needle ischanged, sharing may result in passing of viruses like hepatitis or HIV. Each time you get a new box of pen needles, check to see if they are the same type as the ones you were trained to use. If not, ask your care team to showyou how to use this new type properly. Wear a medical ID bracelet or chain, and carry a card that describes yourdisease and details of your medication and dosage times. What side effects may I notice from receiving this medication? Side effects that you should report to your care team as soon as possible: Allergic reactions-skin rash, itching, hives, swelling of the face, lips, tongue, or throat Low blood sugar (hypoglycemia)-tremors or shaking, anxiety, sweating, cold or clammy skin, confusion, dizziness, rapid heartbeat Low potassium level-muscle pain or cramps, unusual weakness or fatigue, fast or irregular heartbeat, constipation Side effects that usually do not require medical attention (report to your careteam if they continue or are bothersome): Lipodystrophy-hardening or scarring of tissue at injection site Pain, redness, or irritation at injection site Weight gain This list may not describe all possible side  effects. Call your doctor for medical advice about side effects. You may report side effects to FDA at1-800-FDA-1088. Where should I keep my medication? Keep out of the reach of children and pets. Unopened Vials: Lantus vials: Store in a refrigerator between 2 and 8 degrees C (36 and 46 degrees F) or at room  temperature below 30 degrees C (86 degrees F). Do not freeze or use if the insulin has been frozen. Protect from light and excessive heat. If stored at room temperature, the vial must be discarded after 28 days. Throw away any unopened and unused medication that has been stored in therefrigerator after the expiration date. Unopened Pens: Neurosurgeon: Store in a refrigerator between 2 and 8 degrees C (36 and 46 degrees F) or at room temperature below 30 degrees C (86 degrees F). Do not freeze or use if the insulin has been frozen. Protect from light and excessive heat. If stored at room temperature, the pen must be discarded after 28 days. Throw away any unopened and unused medication that has been stored in therefrigerator after the expiration date. Lantus Solostar Pens: Store in a refrigerator between 2 and 8 degrees C (36 and 46 degrees F) or at room temperature below 30 degrees C (86 degrees F). Do not freeze or use if the insulin has been frozen. Protect from light and excessive heat. If stored at room temperature, the pen must be discarded after 28 days. Throw away any unopened and unused medication that has been stored in therefrigerator after the expiration date. Semglee Pens: Store in a refrigerator between 2 and 8 degrees C (36 and 46 degrees F) or at room temperature below 30 degrees C (86 degrees F). Do not freeze or use if the insulin has been frozen. Protect from light and excessive heat. If stored at room temperature, the pen must be discarded after 28 days. Throw away any unopened and unused medication that has been stored in therefrigerator after the expiration date. Toujeo Solostar Pens or Toujeo Max [employer] Pens: Store in a refrigerator between 2 and 8 degrees C (36 and 46 degrees F). Do not freeze or use if the insulin has been frozen. Protect from light and excessive heat. Throw away any unopened and unused medication that has been stored in the refrigerator afterthe expiration  date. Vials that you are using: Lantus vials: Store in a refrigerator or at room temperature below 30 degrees C (86 degrees F). Do not freeze. Keep away from heat and light. Throw the openedvial away after 28 days. Semglee vials: Store in a refrigerator or at room temperature below 30 degrees C (86 degrees F). Do not freeze. Keep away from heat and light. Throw theopened vial away after 28 days. Pens that you are using: Basaglar KwikPens: Store at room temperature below 30 degrees C (86 degrees F). Do not refrigerate or freeze. Keep away from heat and light. Throw the pen awayafter 28 days, even if it still has insulin left in it. Lantus Solostar Pens: Store at room temperature below 30 degrees C (86 degrees F). Do not refrigerate or freeze. Keep away from heat and light. Throw the penaway after 28 days, even if it still has insulin left in it. Semglee Pens: Store at room temperature below 30 degrees C (86 degrees F). Do not refrigerate or freeze. Keep away from heat and light. Throw the pen awayafter 28 days, even if it still has insulin left in it. Toujeo Solostar Pens or [employer] Max [employer] Pens: Store  at room temperature below 30 degrees C (86 degrees F). Do not refrigerate or freeze. Keep away from heat and light. Throw the pen away after 56 days, even if it still has insulinleft in it. NOTE: This sheet is a summary. It may not cover all possible information. If you have questions about this medicine, talk to your doctor, pharmacist, orhealth care provider.  2022 [employer]vier/Gold Standard (2020-03-18 11:48:35)

## 2020-11-01 NOTE — Assessment & Plan Note (Signed)
Due to DM. Need better control of A1C.

## 2020-11-03 DIAGNOSIS — C61 Malignant neoplasm of prostate: Secondary | ICD-10-CM | POA: Diagnosis not present

## 2020-11-15 ENCOUNTER — Ambulatory Visit: Payer: Medicare PPO | Admitting: Family Medicine

## 2020-11-15 ENCOUNTER — Other Ambulatory Visit: Payer: Self-pay

## 2020-11-15 ENCOUNTER — Encounter: Payer: Self-pay | Admitting: Family Medicine

## 2020-11-15 VITALS — BP 126/74 | HR 77 | Temp 98.3°F | Ht 70.75 in | Wt 186.0 lb

## 2020-11-15 DIAGNOSIS — E1165 Type 2 diabetes mellitus with hyperglycemia: Secondary | ICD-10-CM | POA: Diagnosis not present

## 2020-11-15 DIAGNOSIS — E1140 Type 2 diabetes mellitus with diabetic neuropathy, unspecified: Secondary | ICD-10-CM | POA: Diagnosis not present

## 2020-11-15 NOTE — Progress Notes (Signed)
Patient ID: Alexander Yang, male    DOB: 1940-08-02, 80 y.o.   MRN: [id number]  This visit was conducted in person.  BP 126/74   Pulse 77   Temp 98.3 F (36.8 C) (Temporal)   Ht 5' 10.75" (1.797 m)   Wt 186 lb (84.4 kg)   SpO2 97%   BMI 26.13 kg/m    CC: Chief Complaint  Patient presents with   2 Week Follow-up    Subjective:   HPI: Alexander Yang is a 80 y.o. male presenting on 11/15/2020 for 2 Week Follow-up    Diabetes:  At last OV started on Lantus 10 units with instructions to titrate up according to fasting glucose goal < 120 every 2-3 days.  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 he relates this his other medicaiton.     Relevant past medical, surgical, family and social history reviewed and updated as indicated. Interim medical history since our last visit reviewed. Allergies and medications reviewed and updated. Outpatient Medications Prior to Visit  Medication Sig Dispense Refill   ACCU-CHEK AVIVA PLUS test strip USE TO CHECK BLOOD SUGAR 2 TIMES A DAY. DX: E11.49 100 strip 11   amLODipine (NORVASC) 5 MG tablet Take 1 tablet (5 mg total) by mouth daily. 30 tablet 11   aspirin 81 MG tablet Take 81 mg by mouth every other day.     atorvastatin (LIPITOR) 40 MG tablet TAKE 1 TABLET BY MOUTH ONCE DAILY 90 tablet 0   Blood Glucose Monitoring Suppl (ACCU-CHEK AVIVA PLUS) w/Device KIT Use to check blood sugar 2 times a day.  Dx: E11.49 1 kit 0   CALCIUM-VITAMIN D PO Take 1 tablet by mouth daily.     dicyclomine (BENTYL) 10 MG capsule Take 1 capsule (10 mg total) by mouth 2 (two) times daily as needed for spasms. 180 capsule 0   famotidine (PEPCID) 20 MG tablet Take 1 tablet (20 mg total) by mouth at bedtime. 30  tablet 3   insulin glargine (LANTUS SOLOSTAR) 100 UNIT/ML Solostar Pen Inject 10 Units into the skin daily. 3 mL 11   Insulin Pen Needle (PEN NEEDLES) 32G X 4 MM MISC Use to inject insulin daily 100 each 3   ketoconazole (NIZORAL) 2 % cream Apply 1 application topically daily as needed for irritation.     Lancets (ACCU-CHEK SOFT TOUCH) lancets Use to check blood sugar 2 times a day.  Dx: E11.49 100 each 11   Lancets Misc. (ACCU-CHEK SOFTCLIX LANCET DEV) KIT Use to check blood sugar 2 times a day.  Dx: E11.49 1 kit 0   Leuprolide Acetate, 3 Month, (ELIGARD) 22.5 MG injection Inject 22.5 mg into the skin every 30 (thirty) days.      losartan (COZAAR) 100 MG tablet TAKE 1 TABLET BY MOUTH ONCE DAILY 90 tablet 3   No facility-administered medications prior to visit.     Per HPI unless specifically indicated in ROS section below Review of Systems  Constitutional:  Negative for fatigue and fever.  HENT:  Negative for ear pain.   Eyes:  Negative for pain.  Respiratory:  Negative for cough and shortness of breath.   Cardiovascular:  Negative for chest pain, palpitations and leg swelling.  Gastrointestinal:  Negative for abdominal pain.  Genitourinary:  Negative for dysuria.  Musculoskeletal:  Negative for arthralgias.  Neurological:  Negative for syncope, light-headedness and headaches.  Psychiatric/Behavioral:  Negative for dysphoric mood.   Objective:  BP 126/74   Pulse 77   Temp 98.3 F (36.8 C) (Temporal)   Ht 5' 10.75" (1.797 m)   Wt 186 lb (84.4 kg)   SpO2 97%   BMI 26.13 kg/m   Wt Readings from Last 3 Encounters:  11/15/20 186 lb (84.4 kg)  11/01/20 186 lb 8 oz (84.6 kg)  08/19/20 186 lb 8 oz (84.6 kg)      Physical Exam Constitutional:      Appearance: He is well-developed.  HENT:     Head: Normocephalic.     Right Ear: Hearing normal.     Left Ear: Hearing normal.     Nose: Nose normal.  Neck:     Thyroid: No thyroid mass or thyromegaly.     Vascular: No carotid  bruit.     Trachea: Trachea normal.  Cardiovascular:     Rate and Rhythm: Normal rate and regular rhythm.     Pulses: Normal pulses.     Heart sounds: Heart sounds not distant. No murmur heard.   No friction rub. No gallop.     Comments: No peripheral edema Pulmonary:     Effort: Pulmonary effort is normal. No respiratory distress.     Breath sounds: Normal breath sounds.  Skin:    General: Skin is warm and dry.     Findings: No rash.  Psychiatric:        Speech: Speech normal.        Behavior: Behavior normal.        Thought Content: Thought content normal.      Results for orders placed or performed in visit on 11/01/20  HM DIABETES FOOT EXAM  Result Value Ref Range   HM Diabetic Foot Exam done     This visit occurred during the SARS-CoV-2 public health emergency.  Safety protocols were in place, including screening questions prior to the visit, additional usage of staff PPE, and extensive cleaning of exam room while observing appropriate contact time as indicated for disinfecting solutions.   COVID 19 screen:  No recent travel or known exposure to COVID19 The patient denies respiratory symptoms of COVID 19 at this time. The importance of social distancing was discussed today.   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  Tolerating Lantus but definitely evidence of insulin resistance.  Restart metformin in addition to Lantus 18 units. Start at 500 mg XR and increase to 1071m XR daily.   Re-eval with A1c in 2.5 months. Encouraged exercise, weight loss, healthy eating habits.         Alexander Yang

## 2020-11-15 NOTE — Patient Instructions (Signed)
Add back  metformin XR 500 mg 2  a day.  Continue Lantus 18 units daily .  FBS goal < 120.  Call with measurements in 2 weeks.

## 2020-11-15 NOTE — Assessment & Plan Note (Signed)
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  Tolerating Lantus but definitely evidence of insulin resistance.  Restart metformin in addition to Lantus 18 units. Start at 500 mg XR and increase to '1000mg'$  XR daily.   Re-eval with A1c in 2.5 months. Encouraged exercise, weight loss, healthy eating habits.

## 2020-11-17 ENCOUNTER — Inpatient Hospital Stay: Payer: Medicare PPO

## 2020-11-17 ENCOUNTER — Inpatient Hospital Stay: Payer: Medicare PPO | Attending: Oncology | Admitting: Oncology

## 2020-11-17 ENCOUNTER — Other Ambulatory Visit: Payer: Self-pay

## 2020-11-17 VITALS — BP 157/73 | HR 83 | Temp 98.3°F | Resp 18 | Wt 187.4 lb

## 2020-11-17 DIAGNOSIS — M810 Age-related osteoporosis without current pathological fracture: Secondary | ICD-10-CM | POA: Diagnosis not present

## 2020-11-17 DIAGNOSIS — Z5111 Encounter for antineoplastic chemotherapy: Secondary | ICD-10-CM | POA: Insufficient documentation

## 2020-11-17 DIAGNOSIS — Z923 Personal history of irradiation: Secondary | ICD-10-CM | POA: Insufficient documentation

## 2020-11-17 DIAGNOSIS — Z79899 Other long term (current) drug therapy: Secondary | ICD-10-CM | POA: Diagnosis not present

## 2020-11-17 DIAGNOSIS — E291 Testicular hypofunction: Secondary | ICD-10-CM | POA: Diagnosis not present

## 2020-11-17 DIAGNOSIS — C61 Malignant neoplasm of prostate: Secondary | ICD-10-CM | POA: Diagnosis not present

## 2020-11-17 MED ORDER — PROCHLORPERAZINE MALEATE 10 MG PO TABS: 10.0000 mg | ORAL_TABLET | Freq: Four times a day (QID) | ORAL | 1 refills | Status: DC | PRN

## 2020-11-17 NOTE — Progress Notes (Signed)
Hematology and Oncology Follow Up Visit  Alexander Yang [id number] January 04, 1941 80 y.o. 11/17/2020 8:50 AM Alexander Yang, Alexander E, [name], Alexander E, MD   Principle Diagnosis: 80 year old man with prostate cancer diagnosed in 2009.  He presented with Gleason score 6 and a PSA of 3.17 and currently has a biochemical relapse.   Prior Therapy: Observation and surveillance between 2009 in 2011.  He was found to have a Gleason score 4+4 = 8 in 2011. He received radiation therapy between October 2011 until December 2011.   He received concomitant Taxotere chemotherapy as a part of a clinical trial.  He also received 6 months of hormone therapy up until July 2012. His PSA nadir was down to 0.1  In December 2013.   His PSA continue to be low close to 0.1 and 0.2  Up till December 2015.   In June 2016 was up to 0.4 and his most recent PSA in September 2016 was up to 0.7.   Androgen deprivation therapy with Lupron at 7.5 mg on a monthly basis started in March 2018.  Last injection will be given on January 08, 2017.   Current therapy:     Eligard 7.5 mg monthly restarted in August 2019.   Last treatment given on October 26, 2020.   Interim History:  [name] returns today for repeat evaluation.  Since the last visit, he reports no major changes in his health.  He continues to have some GI distress issues including occasional diarrhea and nausea.  He denies any abdominal pain or vomiting.  He continues to eat and enjoy a reasonable performance status.                Medications: Reviewed without changes. Current Outpatient Medications  Medication Sig Dispense Refill   ACCU-CHEK AVIVA PLUS test strip USE TO CHECK BLOOD SUGAR 2 TIMES A DAY. DX: E11.49 100 strip 11   amLODipine (NORVASC) 5 MG tablet Take 1 tablet (5 mg total) by mouth daily. 30 tablet 11   aspirin 81 MG tablet Take 81 mg by mouth every other day.     atorvastatin (LIPITOR) 40 MG tablet TAKE 1 TABLET BY MOUTH ONCE DAILY  90 tablet 0   Blood Glucose Monitoring Suppl (ACCU-CHEK AVIVA PLUS) w/Device KIT Use to check blood sugar 2 times a day.  Dx: E11.49 1 kit 0   CALCIUM-VITAMIN D PO Take 1 tablet by mouth daily.     dicyclomine (BENTYL) 10 MG capsule Take 1 capsule (10 mg total) by mouth 2 (two) times daily as needed for spasms. 180 capsule 0   famotidine (PEPCID) 20 MG tablet Take 1 tablet (20 mg total) by mouth at bedtime. 30 tablet 3   insulin glargine (LANTUS SOLOSTAR) 100 UNIT/ML Solostar Pen Inject 10 Units into the skin daily. 3 mL 11   Insulin Pen Needle (PEN NEEDLES) 32G X 4 MM MISC Use to inject insulin daily 100 each 3   ketoconazole (NIZORAL) 2 % cream Apply 1 application topically daily as needed for irritation.     Lancets (ACCU-CHEK SOFT TOUCH) lancets Use to check blood sugar 2 times a day.  Dx: E11.49 100 each 11   Lancets Misc. (ACCU-CHEK SOFTCLIX LANCET DEV) KIT Use to check blood sugar 2 times a day.  Dx: E11.49 1 kit 0   Leuprolide Acetate, 3 Month, (ELIGARD) 22.5 MG injection Inject 22.5 mg into the skin every 30 (thirty) days.      losartan (COZAAR) 100 MG tablet TAKE 1 TABLET BY  MOUTH ONCE DAILY 90 tablet 3   No current facility-administered medications for this visit.     Allergies: No Known Allergies      Physical exam:       Blood pressure (!) 157/73, pulse 83, temperature 98.3 F (36.8 C), temperature source Oral, resp. rate 18, weight 187 lb 6 oz (85 kg), SpO2 100 %.       ECOG 0.     General appearance: Alert, awake without any distress. Head: Atraumatic without abnormalities Oropharynx: Without any thrush or ulcers. Eyes: No scleral icterus. Lymph nodes: No lymphadenopathy noted in the cervical, supraclavicular, or axillary nodes Heart:regular rate and rhythm, without any murmurs or gallops.   Lung: Clear to auscultation without any rhonchi, wheezes or dullness to percussion. Abdomin: Soft, nontender without any shifting dullness or  ascites. Musculoskeletal: No clubbing or cyanosis. Neurological: No motor or sensory deficits. Skin: No rashes or lesions. Psychiatric: Mood and affect appeared normal.       PSA on 04/20/2015 was 2.6.  PSA on 07/19/2015 was 3.7. PSA on 10/18/2015 was 3.2. PSA on 01/11/2016 was 4.4. PSA on 04/12/2016 was 8.2. Lupron started in March 2018. PSA on 08/23/2016 was 0.7 PSA in August 2018 was 0.7. PSA in 12/2016 0.7.  Testosterone was less than 3. PSA on April 03, 2017 was 1.5. PSA on July 02, 2017 was 5.7. PSA on 10/02/2017 was 13.7 PSA 01/02/3018 3.9 PSA on April 03, 2018 was 3.4. PSA on April 22 of 2020 was 3.0. PSA on October 24, 2018 was 2.6 PSA on January 26, 2019 was 8.5. PSA on 04/29/2019 8.3 PSA on 07/30/2019 15.7 PSA on 11/02/2019 9.3 testosterone that is less than 3. PSA on February 02, 2020 was 8.3. PSA on May 04, 2020 was 7.5. PSA on 08/03/2020 was 8.8. PSA 11/03/2020 10.0    IMPRESSION: 1. Again demonstrated intense radiotracer activity within the RIGHT lobe of the prostate gland consistent with recurrent prostate carcinoma. 2. No evidence of metastatic adenopathy in the pelvis or periaortic retroperitoneum. 3. No visceral metastasis or skeletal metastasis. Impression and Plan:   80 year old man with  1.  Prostate cancer diagnosed in 2009.  He has recurrence predominantly within the prostate.  He is currently on androgen deprivation therapy alone and has deferred additional local therapy such as surgery or cryoablation.  Risks and benefits of continuing androgen deprivation therapy were discussed.  Complications such including osteoporosis, weight gain among others were reiterated.  He is agreeable to proceed at this time.  His PSA did increase slightly but is slow doubling time which is more than 6 months.  Additional therapy with androgen receptor pathway inhibitors were discussed but those will be deferred unless he has a rapid rise in his PSA and more  systemic disease.   2.  Osteoporosis: I recommended calcium and vitamin D supplements for the time being.  3.  GI complaints: He does have some nausea and Compazine will be made available to him.  Encouraged him to follow-up with GI regarding this issue.  These are not related to his malignancy.   4. Follow-up:  will continue to receive monthly Eligard and MD follow-up in 3 months.  30  minutes were spent on this visit.  Time was dedicated to reviewing laboratory data, disease status update, treatment choices and complications noted to therapy.  Zola Button, MD 9/8/20228:50 AM

## 2020-11-21 ENCOUNTER — Other Ambulatory Visit: Payer: Self-pay | Admitting: Family Medicine

## 2020-11-24 ENCOUNTER — Inpatient Hospital Stay: Payer: Medicare PPO

## 2020-11-24 ENCOUNTER — Other Ambulatory Visit: Payer: Self-pay

## 2020-11-24 VITALS — BP 144/68 | HR 64 | Temp 98.5°F | Resp 18

## 2020-11-24 DIAGNOSIS — Z79899 Other long term (current) drug therapy: Secondary | ICD-10-CM | POA: Diagnosis not present

## 2020-11-24 DIAGNOSIS — C61 Malignant neoplasm of prostate: Secondary | ICD-10-CM | POA: Diagnosis not present

## 2020-11-24 DIAGNOSIS — E291 Testicular hypofunction: Secondary | ICD-10-CM | POA: Diagnosis not present

## 2020-11-24 DIAGNOSIS — Z5111 Encounter for antineoplastic chemotherapy: Secondary | ICD-10-CM | POA: Diagnosis not present

## 2020-11-24 DIAGNOSIS — Z923 Personal history of irradiation: Secondary | ICD-10-CM | POA: Diagnosis not present

## 2020-11-24 DIAGNOSIS — M810 Age-related osteoporosis without current pathological fracture: Secondary | ICD-10-CM | POA: Diagnosis not present

## 2020-11-24 MED ORDER — LEUPROLIDE ACETATE 7.5 MG ~~LOC~~ KIT
7.5000 mg | PACK | Freq: Once | SUBCUTANEOUS | Status: AC
  Administered 2020-11-24: 7.5 mg via SUBCUTANEOUS
  Filled 2020-11-24: qty 7.5

## 2020-11-30 ENCOUNTER — Telehealth: Payer: Self-pay | Admitting: Family Medicine

## 2020-11-30 NOTE — Telephone Encounter (Signed)
Pt returned call .would like a call back [phone]

## 2020-11-30 NOTE — Telephone Encounter (Signed)
Returned call to Mr. Douthit and advised I was actually called Mrs. Ridings.  Spoke with [place]. See refill encounter in her chart from today.

## 2020-12-08 ENCOUNTER — Encounter: Payer: Self-pay | Admitting: Gastroenterology

## 2020-12-08 ENCOUNTER — Ambulatory Visit: Payer: Medicare PPO | Admitting: Gastroenterology

## 2020-12-08 VITALS — BP 140/72 | HR 75 | Ht 70.75 in | Wt 190.0 lb

## 2020-12-08 DIAGNOSIS — R1030 Lower abdominal pain, unspecified: Secondary | ICD-10-CM | POA: Diagnosis not present

## 2020-12-08 DIAGNOSIS — R110 Nausea: Secondary | ICD-10-CM | POA: Diagnosis not present

## 2020-12-08 DIAGNOSIS — R140 Abdominal distension (gaseous): Secondary | ICD-10-CM | POA: Diagnosis not present

## 2020-12-08 NOTE — Progress Notes (Signed)
[facility] Progress Note  Chief Complaint: Abdominal pain  Subjective  History: I last saw Alexander Yang for a surveillance colonoscopy in December 2019.  Diminutive polyp, rectal changes of XRT, no recall recommended.  He had some change in bowel habits at that time as well, things that are really gone on since radiation treatment for his prostate cancer in 2011.  At the time of 2019 colonoscopy,biopsies were negative for IBS, trial of dicyclomine given. He was seen by our APP on 06/27/2020 for about 6 months of crampy lower abdominal pain and "queasiness".  He was having several soft bowel movements per day, symptoms abating in the afternoon and after eating.  CT abdomen and pelvis unrevealing.  Patient message to the office with concerns that he could have Salmonella after a nationwide peanut butter recall.  He was not having loose stool, several formed BMs per day, but GI pathogen panel negative on 08/17/2020. Afterward he continued dicyclomine, align and Pepcid, none of which he feels has helped the symptoms. He has intermittent nausea, which she describes as a "sick feeling" with lower abdominal discomfort.  He describes it as a mid to lower dull discomfort at most a 3 out of 10.  For about the last 2 years his appetite is decreased, sometimes does not feel much like dinner when his wife asks him what he wants. Typically 3-4 soft but not loose stools per day, no bleeding.  In retrospect, he feels that the symptoms came on to a lesser degree when he started taking hormonal therapy for prostate cancer.  He is a similar symptoms to what he had had years ago but they are now just more persistent in the last 1-1/2 to 2 years His weight remains stable.  This trouble almost always bothers him between 3 and 6 AM, then it dissipates by the mid afternoon.  He has not noticed any clear food triggers  ROS: Cardiovascular:  no chest pain Respiratory: no dyspnea Fatigue The patient's Past Medical, Family  and Social History were reviewed and are on file in the EMR.  Objective:  Med list reviewed  Current Outpatient Medications:    ACCU-CHEK AVIVA PLUS test strip, USE TO CHECK BLOOD SUGAR 2 TIMES A DAY. DX: E11.49, Disp: 100 strip, Rfl: 11   amLODipine (NORVASC) 5 MG tablet, Take 1 tablet (5 mg total) by mouth daily., Disp: 30 tablet, Rfl: 11   aspirin 81 MG tablet, Take 81 mg by mouth every other day., Disp: , Rfl:    atorvastatin (LIPITOR) 40 MG tablet, TAKE 1 TABLET BY MOUTH ONCE DAILY, Disp: 90 tablet, Rfl: 3   Blood Glucose Monitoring Suppl (ACCU-CHEK AVIVA PLUS) w/Device KIT, Use to check blood sugar 2 times a day.  Dx: E11.49, Disp: 1 kit, Rfl: 0   CALCIUM-VITAMIN D PO, Take 1 tablet by mouth daily., Disp: , Rfl:    dicyclomine (BENTYL) 10 MG capsule, Take 1 capsule (10 mg total) by mouth 2 (two) times daily as needed for spasms., Disp: 180 capsule, Rfl: 0   famotidine (PEPCID) 20 MG tablet, Take 1 tablet (20 mg total) by mouth at bedtime., Disp: 30 tablet, Rfl: 3   insulin glargine (LANTUS SOLOSTAR) 100 UNIT/ML Solostar Pen, Inject 10 Units into the skin daily., Disp: 3 mL, Rfl: 11   Insulin Pen Needle (PEN NEEDLES) 32G X 4 MM MISC, Use to inject insulin daily, Disp: 100 each, Rfl: 3   ketoconazole (NIZORAL) 2 % cream, Apply 1 application topically daily as  needed for irritation., Disp: , Rfl:    Lancets (ACCU-CHEK SOFT TOUCH) lancets, Use to check blood sugar 2 times a day.  Dx: E11.49, Disp: 100 each, Rfl: 11   Lancets Misc. (ACCU-CHEK SOFTCLIX LANCET DEV) KIT, Use to check blood sugar 2 times a day.  Dx: E11.49, Disp: 1 kit, Rfl: 0   Leuprolide Acetate, 3 Month, (ELIGARD) 22.5 MG injection, Inject 22.5 mg into the skin every 30 (thirty) days. , Disp: , Rfl:    losartan (COZAAR) 100 MG tablet, TAKE 1 TABLET BY MOUTH ONCE DAILY, Disp: 90 tablet, Rfl: 3   prochlorperazine (COMPAZINE) 10 MG tablet, Take 1 tablet (10 mg total) by mouth every 6 (six) hours as needed for nausea or vomiting.,  Disp: 30 tablet, Rfl: 1   Vital signs in last 24 hrs: Vitals:   12/08/20 1414  BP: 140/72  Pulse: 75   Wt Readings from Last 3 Encounters:  12/08/20 190 lb (86.2 kg)  11/17/20 187 lb 6 oz (85 kg)  11/15/20 186 lb (84.4 kg)    Physical Exam  Well-appearing, good muscle mass HEENT: sclera anicteric, oral mucosa moist without lesions Neck: supple, no thyromegaly, JVD or lymphadenopathy Cardiac: RRR without murmurs, S1S2 heard, no peripheral edema Pulm: clear to auscultation bilaterally, normal RR and effort noted Abdomen: soft, no tenderness, with active bowel sounds. No guarding or palpable hepatosplenomegaly. Skin; warm and dry, no jaundice or rash  Labs:   ___________________________________________ Radiologic studies: CLINICAL DATA:  Central abdominal and right lower quadrant pain for 6 months. Prostate carcinoma.   EXAM: CT ABDOMEN AND PELVIS WITH CONTRAST   TECHNIQUE: Multidetector CT imaging of the abdomen and pelvis was performed using the standard protocol following bolus administration of intravenous contrast.   CONTRAST:  19m OMNIPAQUE IOHEXOL 300 MG/ML  SOLN   COMPARISON:  PET-CT on 07/15/2019   FINDINGS: Lower Chest: No acute findings.   Hepatobiliary:  No hepatic masses identified.   Pancreas:  No mass or inflammatory changes.   Spleen: Within normal limits in size and appearance.   Adrenals/Urinary Tract: No masses identified. 3 mm calculus again seen in midpole of left kidney. No evidence of ureteral calculi or hydronephrosis. Unremarkable unopacified urinary bladder.   Stomach/Bowel: No evidence of obstruction, inflammatory process or abnormal fluid collections. Although the appendix is not directly visualized, no inflammatory process seen in region of the cecum or elsewhere. Diverticulosis is seen mainly involving the sigmoid colon, however there is no evidence of diverticulitis.   Vascular/Lymphatic: No pathologically enlarged lymph  nodes. No acute vascular findings. Aortic atherosclerotic calcification noted.   Reproductive: Normal size prostate gland with several fiducial markers again noted. Symmetric seminal vesicles.   Other:  None.   Musculoskeletal:  No suspicious bone lesions identified.   IMPRESSION: No acute findings within the abdomen or pelvis.   Colonic diverticulosis, without radiographic evidence of diverticulitis.   Tiny nonobstructing left renal calculus.   Aortic Atherosclerosis (ICD10-I70.0).     Electronically Signed   By: JMarlaine HindM.D.   On: 07/02/2020 13:07   ____________________________________________ Other:   _____________________________________________ Assessment & Plan  Assessment: Encounter Diagnoses  Name Primary?   Lower abdominal pain Yes   Nausea in adult    Abdominal bloating    Chronic somewhat vague mid to lower abdominal discomfort with intermittent nausea and bloating, no diarrhea.  Symptoms started years ago to a lesser degree after radiation for prostate and perhaps also with subsequent hormonal therapy.  Normal colonoscopy a few years back  other than findings noted above, negative CT scan.  He looks well, he has no bleeding and his weight is stable. I cannot explain what is causing this, but is near as I can tell it is not something harmful.  My only other thought was possible SIBO, though he does not have loose or oily stool or weight loss, and does not have typical risk factors for that other than diabetes.  He does note that his diabetes has been more difficult to control lately.  His symptoms are also not typical for EPI.  I offered him an empiric trial of metronidazole in case he has SIBO, but he would rather not do that since it is an uncertain diagnosis and he feels he can live with the symptoms.  If things escalate, he plans to contact me and reconsider that.  Stop dicyclomine and famotidine since they have not been helping, and he stopped the align  quite some time ago because a few months of it had also not helped.   31 minutes were spent on this encounter (including chart review, history/exam, counseling/coordination of care, and documentation) > 50% of that time was spent on counseling and coordination of care.   Alexander Yang

## 2020-12-08 NOTE — Patient Instructions (Signed)
If you are age 80 or older, your body mass index should be between 23-30. Your Body mass index is 26.69 kg/m. If this is out of the aforementioned range listed, please consider follow up with your Primary Care Provider.  If you are age 12 or younger, your body mass index should be between 19-25. Your Body mass index is 26.69 kg/m. If this is out of the aformentioned range listed, please consider follow up with your Primary Care Provider.   __________________________________________________________  The [facility] providers would like to encourage you to use [facility] to communicate with providers for non-urgent requests or questions.  Due to long hold times on the telephone, sending your provider a message by [facility] may be a faster and more efficient way to get a response.  Please allow 48 business hours for a response.  Please remember that this is for non-urgent requests.   It was a pleasure to see you today!  Thank you for trusting me with your gastrointestinal care!

## 2020-12-12 DIAGNOSIS — E785 Hyperlipidemia, unspecified: Secondary | ICD-10-CM | POA: Diagnosis not present

## 2020-12-12 DIAGNOSIS — Z794 Long term (current) use of insulin: Secondary | ICD-10-CM | POA: Diagnosis not present

## 2020-12-12 DIAGNOSIS — C61 Malignant neoplasm of prostate: Secondary | ICD-10-CM | POA: Diagnosis not present

## 2020-12-12 DIAGNOSIS — R109 Unspecified abdominal pain: Secondary | ICD-10-CM | POA: Diagnosis not present

## 2020-12-12 DIAGNOSIS — Z79818 Long term (current) use of other agents affecting estrogen receptors and estrogen levels: Secondary | ICD-10-CM | POA: Diagnosis not present

## 2020-12-12 DIAGNOSIS — I10 Essential (primary) hypertension: Secondary | ICD-10-CM | POA: Diagnosis not present

## 2020-12-12 DIAGNOSIS — E1140 Type 2 diabetes mellitus with diabetic neuropathy, unspecified: Secondary | ICD-10-CM | POA: Diagnosis not present

## 2020-12-12 DIAGNOSIS — K219 Gastro-esophageal reflux disease without esophagitis: Secondary | ICD-10-CM | POA: Diagnosis not present

## 2020-12-12 DIAGNOSIS — I700 Atherosclerosis of aorta: Secondary | ICD-10-CM | POA: Diagnosis not present

## 2020-12-22 ENCOUNTER — Other Ambulatory Visit: Payer: Self-pay

## 2020-12-22 ENCOUNTER — Inpatient Hospital Stay: Payer: Medicare PPO | Attending: Oncology

## 2020-12-22 VITALS — BP 149/78 | HR 72 | Temp 99.1°F | Resp 18

## 2020-12-22 DIAGNOSIS — Z79899 Other long term (current) drug therapy: Secondary | ICD-10-CM | POA: Insufficient documentation

## 2020-12-22 DIAGNOSIS — C61 Malignant neoplasm of prostate: Secondary | ICD-10-CM | POA: Diagnosis not present

## 2020-12-22 DIAGNOSIS — Z5111 Encounter for antineoplastic chemotherapy: Secondary | ICD-10-CM | POA: Diagnosis not present

## 2020-12-22 MED ORDER — LEUPROLIDE ACETATE 7.5 MG ~~LOC~~ KIT
7.5000 mg | PACK | Freq: Once | SUBCUTANEOUS | Status: AC
  Administered 2020-12-22: 7.5 mg via SUBCUTANEOUS
  Filled 2020-12-22: qty 7.5

## 2020-12-23 ENCOUNTER — Other Ambulatory Visit: Payer: Self-pay | Admitting: Family Medicine

## 2020-12-23 DIAGNOSIS — R110 Nausea: Secondary | ICD-10-CM

## 2021-01-12 MED ORDER — LANTUS SOLOSTAR 100 UNIT/ML ~~LOC~~ SOPN: 25.0000 [IU] | PEN_INJECTOR | Freq: Every day | SUBCUTANEOUS | 0 refills | Status: DC

## 2021-01-17 ENCOUNTER — Other Ambulatory Visit: Payer: Self-pay

## 2021-01-17 ENCOUNTER — Ambulatory Visit: Payer: Medicare PPO | Admitting: Family Medicine

## 2021-01-17 VITALS — BP 122/70 | HR 69 | Temp 96.9°F | Ht 70.75 in | Wt 186.5 lb

## 2021-01-17 DIAGNOSIS — E1159 Type 2 diabetes mellitus with other circulatory complications: Secondary | ICD-10-CM | POA: Diagnosis not present

## 2021-01-17 DIAGNOSIS — E1165 Type 2 diabetes mellitus with hyperglycemia: Secondary | ICD-10-CM

## 2021-01-17 DIAGNOSIS — I152 Hypertension secondary to endocrine disorders: Secondary | ICD-10-CM | POA: Diagnosis not present

## 2021-01-17 DIAGNOSIS — E1140 Type 2 diabetes mellitus with diabetic neuropathy, unspecified: Secondary | ICD-10-CM

## 2021-01-17 LAB — POCT GLYCOSYLATED HEMOGLOBIN (HGB A1C): Hemoglobin A1C: 8.4 % — AB (ref 4.0–5.6)

## 2021-01-17 NOTE — Patient Instructions (Addendum)
Increase Lantus to 28 Units.  Continue FODMAP diet.  Look into getting the continuous glucose monitor from [place].

## 2021-01-17 NOTE — Assessment & Plan Note (Signed)
Stable, chronic.  Continue current medication.   Losartan  100 mg daily, amlodipine 5 mg daily.

## 2021-01-17 NOTE — Assessment & Plan Note (Signed)
Improving , chronic.  Continue current medication but increase Lantus to  28 Units.  Metformin at max dose.   Goal A1C not aggressive given age... < 8, ideally 7.5

## 2021-01-17 NOTE — Progress Notes (Signed)
Patient ID: Alexander Yang, male    DOB: 1940/05/19, 80 y.o.   MRN: [id number]  This visit was conducted in person.  BP 122/70   Pulse 69   Temp (!) 96.9 F (36.1 C) (Temporal)   Ht 5' 10.75" (1.797 m)   Wt 186 lb 8 oz (84.6 kg)   SpO2 99%   BMI 26.20 kg/m    CC: Chief Complaint  Patient presents with   Diabetes    Subjective:   HPI: Alexander Yang is a 80 y.o. male presenting on 01/17/2021 for Diabetes  Diabetes:  Currently on Lantus 25 units daily... we have discussed previously trying 70/30  SE in past to GLP1 (trulicity) with severe abd pain.  Also on metformin daily ( no improvement with GI symptoms off of this)   He has been altering diet for GI issues. Lab Results  Component Value Date   HGBA1C 8.4 (A) 01/17/2021   Has been looking into continuous glucose monitoring.  Using medications without difficulties: Hypoglycemic episodes: none Hyperglycemic episodes: none Feet problems: Blood Sugars averaging: 118-170.Marland Kitchen average 140-150 eye exam within last year:  Patients graph shows improvement in FBS as Lantus increased.   Hypertension:   Good control on amlodipine 5 mg, losartan 100 mg daily BP Readings from Last 3 Encounters:  01/17/21 122/70  12/22/20 (!) 149/78  12/08/20 140/72  Using medication without problems or lightheadedness:  none Chest pain with exertion: none Edema:none Short of breath: none Average home BPs: Other issues: Wt Readings from Last 3 Encounters:  01/17/21 186 lb 8 oz (84.6 kg)  12/08/20 190 lb (86.2 kg)  11/17/20 187 lb 6 oz (85 kg)     He continue to have nausea, bloating . Has seen GI, Dr. Loletha Carrow.Marland Kitchen dx possible bacterial overgrowth.  Reviewed OV note from 12/08/2020  Pending second opinion at [place]... appt in 03/2020  He has been trying FODMAP diet in last 1 weeks.  Occ using simethicone.      Relevant past medical, surgical, family and social history reviewed and updated as indicated. Interim medical history  since our last visit reviewed. Allergies and medications reviewed and updated. Outpatient Medications Prior to Visit  Medication Sig Dispense Refill   ACCU-CHEK AVIVA PLUS test strip USE TO CHECK BLOOD SUGAR 2 TIMES A DAY. DX: E11.49 100 strip 11   amLODipine (NORVASC) 5 MG tablet Take 1 tablet (5 mg total) by mouth daily. 30 tablet 11   aspirin 81 MG tablet Take 81 mg by mouth every other day.     atorvastatin (LIPITOR) 40 MG tablet TAKE 1 TABLET BY MOUTH ONCE DAILY 90 tablet 3   Blood Glucose Monitoring Suppl (ACCU-CHEK AVIVA PLUS) w/Device KIT Use to check blood sugar 2 times a day.  Dx: E11.49 1 kit 0   CALCIUM-VITAMIN D PO Take 1 tablet by mouth daily.     CINNAMON PO Take 1 capsule by mouth daily.     dicyclomine (BENTYL) 10 MG capsule Take 1 capsule (10 mg total) by mouth 2 (two) times daily as needed for spasms. 180 capsule 0   famotidine (PEPCID) 20 MG tablet Take 1 tablet (20 mg total) by mouth at bedtime. 30 tablet 3   insulin glargine (LANTUS SOLOSTAR) 100 UNIT/ML Solostar Pen Inject 25 Units into the skin daily. 15 mL 0   Insulin Pen Needle (PEN NEEDLES) 32G X 4 MM MISC Use to inject insulin daily 100 each 3   ketoconazole (NIZORAL) 2 % cream Apply 1 application  topically daily as needed for irritation.     Lancets (ACCU-CHEK SOFT TOUCH) lancets Use to check blood sugar 2 times a day.  Dx: E11.49 100 each 11   Lancets Misc. (ACCU-CHEK SOFTCLIX LANCET DEV) KIT Use to check blood sugar 2 times a day.  Dx: E11.49 1 kit 0   Leuprolide Acetate, 3 Month, (ELIGARD) 22.5 MG injection Inject 22.5 mg into the skin every 30 (thirty) days.      losartan (COZAAR) 100 MG tablet TAKE 1 TABLET BY MOUTH ONCE DAILY 90 tablet 3   prochlorperazine (COMPAZINE) 10 MG tablet Take 1 tablet (10 mg total) by mouth every 6 (six) hours as needed for nausea or vomiting. 30 tablet 1   No facility-administered medications prior to visit.     Per HPI unless specifically indicated in ROS section below Review  of Systems  Constitutional:  Negative for fatigue and fever.  HENT:  Negative for ear pain.   Eyes:  Negative for pain.  Respiratory:  Negative for cough and shortness of breath.   Cardiovascular:  Negative for chest pain, palpitations and leg swelling.  Gastrointestinal:  Negative for abdominal pain.  Genitourinary:  Negative for dysuria.  Musculoskeletal:  Negative for arthralgias.  Neurological:  Negative for syncope, light-headedness and headaches.  Psychiatric/Behavioral:  Negative for dysphoric mood.   Objective:  BP 122/70   Pulse 69   Temp (!) 96.9 F (36.1 C) (Temporal)   Ht 5' 10.75" (1.797 m)   Wt 186 lb 8 oz (84.6 kg)   SpO2 99%   BMI 26.20 kg/m   Wt Readings from Last 3 Encounters:  01/17/21 186 lb 8 oz (84.6 kg)  12/08/20 190 lb (86.2 kg)  11/17/20 187 lb 6 oz (85 kg)      Physical Exam Constitutional:      Appearance: He is well-developed.  HENT:     Head: Normocephalic.     Right Ear: Hearing normal.     Left Ear: Hearing normal.     Nose: Nose normal.  Neck:     Thyroid: No thyroid mass or thyromegaly.     Vascular: No carotid bruit.     Trachea: Trachea normal.  Cardiovascular:     Rate and Rhythm: Normal rate and regular rhythm.     Pulses: Normal pulses.     Heart sounds: Heart sounds not distant. No murmur heard.   No friction rub. No gallop.     Comments: No peripheral edema Pulmonary:     Effort: Pulmonary effort is normal. No respiratory distress.     Breath sounds: Normal breath sounds.  Skin:    General: Skin is warm and dry.     Findings: No rash.  Psychiatric:        Speech: Speech normal.        Behavior: Behavior normal.        Thought Content: Thought content normal.      Results for orders placed or performed in visit on 11/01/20  HM DIABETES FOOT EXAM  Result Value Ref Range   HM Diabetic Foot Exam done     This visit occurred during the SARS-CoV-2 public health emergency.  Safety protocols were in place, including  screening questions prior to the visit, additional usage of staff PPE, and extensive cleaning of exam room while observing appropriate contact time as indicated for disinfecting solutions.   COVID 19 screen:  No recent travel or known exposure to COVID19 The patient denies respiratory symptoms of COVID 19  at this time. The importance of social distancing was discussed today.   Assessment and Plan    Problem List Items Addressed This Visit     Diabetes mellitus with neurological manifestations, controlled ([place]) (Chronic)    Improving , chronic.  Continue current medication but increase Lantus to  28 Units.  Metformin at max dose.   Goal A1C not aggressive given age... 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  Continue current medication.   Losartan  100 mg daily, amlodipine 5 mg daily.      Poorly controlled type 2 diabetes mellitus with neuropathy (HCC) - Primary   Relevant Orders   POCT glycosylated hemoglobin (Hb A1C) (Completed)     Eliezer Lofts, MD

## 2021-01-17 NOTE — Addendum Note (Signed)
Addended by: Carter Kitten on: 01/17/2021 09:09 AM   Modules accepted: Orders

## 2021-01-22 ENCOUNTER — Encounter: Payer: Self-pay | Admitting: Oncology

## 2021-01-26 ENCOUNTER — Inpatient Hospital Stay: Payer: Medicare PPO | Attending: Oncology

## 2021-01-26 ENCOUNTER — Other Ambulatory Visit: Payer: Self-pay

## 2021-01-26 VITALS — BP 148/73 | HR 62 | Temp 98.0°F | Resp 18

## 2021-01-26 DIAGNOSIS — C61 Malignant neoplasm of prostate: Secondary | ICD-10-CM | POA: Insufficient documentation

## 2021-01-26 DIAGNOSIS — Z5111 Encounter for antineoplastic chemotherapy: Secondary | ICD-10-CM | POA: Insufficient documentation

## 2021-01-26 MED ORDER — LEUPROLIDE ACETATE 7.5 MG ~~LOC~~ KIT
7.5000 mg | PACK | Freq: Once | SUBCUTANEOUS | Status: AC
  Administered 2021-01-26: 10:00:00 7.5 mg via SUBCUTANEOUS
  Filled 2021-01-26: qty 7.5

## 2021-01-26 NOTE — Patient Instructions (Signed)
Leuprolide depot injection What is this medication? LEUPROLIDE (loo PROE lide) is a man-made protein that acts like a natural hormone in the body. It decreases testosterone in men and decreases estrogen in women. In men, this medicine is used to treat advanced prostate cancer. In women, some forms of this medicine may be used to treat endometriosis, uterine fibroids, or other male hormone-related problems. This medicine may be used for other purposes; ask your health care provider or pharmacist if you have questions. COMMON BRAND NAME(S): Eligard, Fensolv, Lupron Depot, Lupron Depot-Ped, Viadur What should I tell my care team before I take this medication? They need to know if you have any of these conditions: diabetes heart disease or previous heart attack high blood pressure high cholesterol mental illness osteoporosis pain or difficulty passing urine seizures spinal cord metastasis stroke suicidal thoughts, plans, or attempt; a previous suicide attempt by you or a family member tobacco smoker unusual vaginal bleeding (women) an unusual or allergic reaction to leuprolide, benzyl alcohol, other medicines, foods, dyes, or preservatives pregnant or trying to get pregnant breast-feeding How should I use this medication? This medicine is for injection into a muscle or for injection under the skin. It is given by a health care professional in a hospital or clinic setting. The specific product will determine how it will be given to you. Make sure you understand which product you receive and how often you will receive it. Talk to your pediatrician regarding the use of this medicine in children. Special care may be needed. Overdosage: If you think you have taken too much of this medicine contact a poison control center or emergency room at once. NOTE: This medicine is only for you. Do not share this medicine with others. What if I miss a dose? It is important not to miss a dose. Call your  doctor or health care professional if you are unable to keep an appointment. Depot injections: Depot injections are given either once-monthly, every 12 weeks, every 16 weeks, or every 24 weeks depending on the product you are prescribed. The product you are prescribed will be based on if you are male or male, and your condition. Make sure you understand your product and dosing. What may interact with this medication? Do not take this medicine with any of the following medications: chasteberry cisapride dronedarone pimozide thioridazine This medicine may also interact with the following medications: herbal or dietary supplements, like black cohosh or DHEA male hormones, like estrogens or progestins and birth control pills, patches, rings, or injections male hormones, like testosterone other medicines that prolong the QT interval (abnormal heart rhythm) This list may not describe all possible interactions. Give your health care provider a list of all the medicines, herbs, non-prescription drugs, or dietary supplements you use. Also tell them if you smoke, drink alcohol, or use illegal drugs. Some items may interact with your medicine. What should I watch for while using this medication? Visit your doctor or health care professional for regular checks on your progress. During the first weeks of treatment, your symptoms may get worse, but then will improve as you continue your treatment. You may get hot flashes, increased bone pain, increased difficulty passing urine, or an aggravation of nerve symptoms. Discuss these effects with your doctor or health care professional, some of them may improve with continued use of this medicine. Male patients may experience a menstrual cycle or spotting during the first months of therapy with this medicine. If this continues, contact your doctor or  health care professional. This medicine may increase blood sugar. Ask your healthcare provider if changes in diet  or medicines are needed if you have diabetes. What side effects may I notice from receiving this medication? Side effects that you should report to your doctor or health care professional as soon as possible: allergic reactions like skin rash, itching or hives, swelling of the face, lips, or tongue breathing problems chest pain depression or memory disorders pain in your legs or groin pain at site where injected or implanted seizures severe headache signs and symptoms of high blood sugar such as being more thirsty or hungry or having to urinate more than normal. You may also feel very tired or have blurry vision swelling of the feet and legs suicidal thoughts or other mood changes visual changes vomiting Side effects that usually do not require medical attention (report to your doctor or health care professional if they continue or are bothersome): breast swelling or tenderness decrease in sex drive or performance diarrhea hot flashes loss of appetite muscle, joint, or bone pains nausea redness or irritation at site where injected or implanted skin problems or acne This list may not describe all possible side effects. Call your doctor for medical advice about side effects. You may report side effects to FDA at 1-800-FDA-1088. Where should I keep my medication? This drug is given in a hospital or clinic and will not be stored at home. NOTE: This sheet is a summary. It may not cover all possible information. If you have questions about this medicine, talk to your doctor, pharmacist, or health care provider.  2022 Elsevier/Gold Standard (2020-11-15 00:00:00)

## 2021-01-27 ENCOUNTER — Encounter: Payer: Self-pay | Admitting: Family Medicine

## 2021-01-27 NOTE — Telephone Encounter (Signed)
See patient MyChart message. Can you help with this? Would [facility] be any sooner? New referral?

## 2021-01-30 ENCOUNTER — Encounter: Payer: Self-pay | Admitting: Gastroenterology

## 2021-02-01 ENCOUNTER — Encounter: Payer: Self-pay | Admitting: Family Medicine

## 2021-02-06 ENCOUNTER — Encounter: Payer: Self-pay | Admitting: Family Medicine

## 2021-02-08 ENCOUNTER — Encounter: Payer: Self-pay | Admitting: Family Medicine

## 2021-02-08 NOTE — Telephone Encounter (Signed)
I am a little confused  Is the patient wanting a second opinion and to be referred to a GI specialist within [place] or was he trying to get a quick appt in general? (Sooner than 02/14/21)  He is scheduled with [facility] on 02/14/21  Date: 02/14/2021  Time: 3:00 PM  Visit Type: FOLLOW UP 30 [337]  Provider: Willia Craze, NP  Referring Provider: Eliezer Lofts E  Notes: sick to stomach, diarrhea, gerd RECALL COLON CHANGED TO OV1ST-AGE//sch with pt wife/humana/th

## 2021-02-10 NOTE — Telephone Encounter (Signed)
Okay then I will cancel this referral since the patient is scheduled with [facility] and no sooner appts with [facility]

## 2021-02-14 ENCOUNTER — Encounter: Payer: Self-pay | Admitting: Nurse Practitioner

## 2021-02-14 ENCOUNTER — Ambulatory Visit: Payer: Medicare PPO | Admitting: Nurse Practitioner

## 2021-02-14 VITALS — BP 140/80 | HR 76 | Ht 71.0 in | Wt 187.0 lb

## 2021-02-14 DIAGNOSIS — R110 Nausea: Secondary | ICD-10-CM

## 2021-02-14 DIAGNOSIS — R1010 Upper abdominal pain, unspecified: Secondary | ICD-10-CM | POA: Diagnosis not present

## 2021-02-14 NOTE — Progress Notes (Signed)
ASSESSMENT AND PLAN    # 80 yo male with complaints of diminished appetite, chronic intermittent nausea,  and upper abdominal discomfort. Symptoms in absence of weight loss.  Etiology unclear but given inconsistent relationship to eating as well as absence of weight loss this does not sound like delayed gastric emptying though it should be noted that his A1c is 8.4.  Patient feels despondent about the nausea, inquiring about a second opinion --Unrevealing CT scan in April. Previously no improvement with Pepto-Bismol, Gas-X, dicyclomine, famotidine, Zofran.  -- Patient is a increased risk for procedures given advanced age.  However, he has not had an upper endoscopy.  He is clearly frustrated with ongoing nausea.  I will talk with his primary GI, Dr. Loletha Carrow to get his thoughts about an upper endoscopy as it would likely be low yield.   HISTORY OF PRESENT ILLNESS    Chief Complaint : sick to stomach  Alexander Yang is a 80 y.o. male known to Dr. Loletha Carrow with a past medical history of prostate cancer s/p radiation, IBS, colon polyps.  Additional medical history as listed in [place] .   Patient was last seen the end of September 2022 . At that time he was having ongoing / recurrent nausea, lower abdominal discomfort and bloating.   CTAP and colonoscopy were unrevealing. He declined empiric trial of flagyl for possible SIBO. Bentyl and famotidine were stopped since they were not helping.    INTERVAL HISTORY Patient is here with daily ongoing nausea without vomiting. Occasionally he has associated bloating and mid upper abdominal discomfort but nausea is main issue. He says the nausea has been present for months. Pepto bismul , Gas-X, Pepcid, Bentyl , or Zofran have not helped any of his symptoms.  The nausea is worse in the am.  Sometimes the nausea is so intense that he wants to induce vomiting but doesn't . He doesn't notice the nausea when distracted.  Sometimes eating helps ( especially breakfast).  Other times eating has no effect. Belching can help the nausea bBut it is hard for him to belch unless he engages in physical activity.  He reports having no appetite for a year now though hasn't had any associated weight loss. Patient wants to know if he will have to live the rest of his life with this nausea. He has several BMs a day.     PREVIOUS ENDOSCOPIC EVALUATIONS / PERTINENT STUDIES:   Dec 2019 colonoscopy  --The examined portion of the ileum was normal. - Increased mucosa vascular pattern in the distal rectum. - One 3 mm polyp in the sigmoid colon, removed with a cold biopsy forceps. Resected and retrieved. - Diverticulosis in the left colon.  Path: Random colon biopsies negative.  Hyperplastic polyp  Current Medications, Allergies, Past Medical History, Past Surgical History, Family History and Social History were reviewed in [employer] record.     Current Outpatient Medications  Medication Sig Dispense Refill   ACCU-CHEK AVIVA PLUS test strip USE TO CHECK BLOOD SUGAR 2 TIMES A DAY. DX: E11.49 100 strip 11   amLODipine (NORVASC) 5 MG tablet Take 1 tablet (5 mg total) by mouth daily. 30 tablet 11   aspirin 81 MG tablet Take 81 mg by mouth every other day.     atorvastatin (LIPITOR) 40 MG tablet TAKE 1 TABLET BY MOUTH ONCE DAILY 90 tablet 3   Blood Glucose Monitoring Suppl (ACCU-CHEK AVIVA PLUS) w/Device KIT Use to check blood sugar 2 times a  day.  Dx: E11.49 1 kit 0   CALCIUM-VITAMIN D PO Take 1 tablet by mouth daily.     CINNAMON PO Take 1 capsule by mouth daily.     insulin glargine (LANTUS) 100 UNIT/ML Solostar Pen Inject 28 Units into the skin daily.     Insulin Pen Needle (PEN NEEDLES) 32G X 4 MM MISC Use to inject insulin daily 100 each 3   ketoconazole (NIZORAL) 2 % cream Apply 1 application topically daily as needed for irritation.     Lancets (ACCU-CHEK SOFT TOUCH) lancets Use to check blood sugar 2 times a day.  Dx: E11.49 100 each 11    Lancets Misc. (ACCU-CHEK SOFTCLIX LANCET DEV) KIT Use to check blood sugar 2 times a day.  Dx: E11.49 1 kit 0   Leuprolide Acetate, 3 Month, (ELIGARD) 22.5 MG injection Inject 22.5 mg into the skin every 30 (thirty) days.      losartan (COZAAR) 100 MG tablet TAKE 1 TABLET BY MOUTH ONCE DAILY 90 tablet 3   prochlorperazine (COMPAZINE) 10 MG tablet Take 1 tablet (10 mg total) by mouth every 6 (six) hours as needed for nausea or vomiting. 30 tablet 1   No current facility-administered medications for this visit.    Review of Systems: No chest pain. No shortness of breath. No urinary complaints.   PHYSICAL EXAM :    Wt Readings from Last 3 Encounters:  02/14/21 187 lb (84.8 kg)  01/17/21 186 lb 8 oz (84.6 kg)  12/08/20 190 lb (86.2 kg)    BP 140/80   Pulse 76   Ht _0  (1.803 m)   Wt 187 lb (84.8 kg)   BMI 26.08 kg/m  Constitutional:  Generally well appearing male in no acute distress. Psychiatric: Pleasant. Normal mood and affect. Behavior is normal. EENT: Pupils normal.  Conjunctivae are normal. No scleral icterus. Neck supple.  Cardiovascular: Normal rate, regular rhythm. No edema Pulmonary/chest: Effort normal and breath sounds normal. No wheezing, rales or rhonchi. Abdominal: Soft, nondistended, nontender. Bowel sounds active throughout. There are no masses palpable. No hepatomegaly. Neurological: Alert and oriented to person place and time. Skin: Skin is warm and dry. No rashes noted.  Tye Savoy, NP  02/14/2021, 3:09 PM

## 2021-02-14 NOTE — Patient Instructions (Addendum)
If you are age 80 or older, your body mass index should be between 23-30. Your Body mass index is 26.08 kg/m. If this is out of the aforementioned range listed, please consider follow up with your Primary Care Provider.  The [place] [facility] providers would like to encourage you to use [facility] to communicate with providers for non-urgent requests or questions.  Due to long hold times on the telephone, sending your provider a message by [facility] may be faster and more efficient way to get a response. Please allow 48 business hours for a response.  Please remember that this is for non-urgent requests/questions.  We will contact you about having an EGD after reviewing with Dr. Loletha Carrow.  It was great seeing you today! Thank you for entrusting me with your care and choosing [facility].  Tye Savoy, NP

## 2021-02-16 ENCOUNTER — Other Ambulatory Visit: Payer: Self-pay | Admitting: Oncology

## 2021-02-16 ENCOUNTER — Encounter: Payer: Self-pay | Admitting: Oncology

## 2021-02-16 ENCOUNTER — Ambulatory Visit: Payer: Medicare PPO | Admitting: Family Medicine

## 2021-02-16 DIAGNOSIS — C61 Malignant neoplasm of prostate: Secondary | ICD-10-CM | POA: Diagnosis not present

## 2021-02-17 NOTE — Progress Notes (Signed)
Beth, can you please call the patient and tell him that Dr. Loletha Carrow is fine proceeding with the upper endoscopy given the persistent nausea.  Would you please arrange for this to be done at the [facility].  Thank

## 2021-02-17 NOTE — Progress Notes (Signed)
____________________________________________________________  Attending physician addendum:  Thank you for sending this case to me. I have reviewed the entire note.  I agree the constellation of symptoms (nausea,bloating,dull lower abd discomfort)  does not clearly point toward an UGI disorder.  However, since he is now describing nausea as the most bothersome symptom, I am agreeable to an upper endoscopy.   Also, if compazine is not helping much, ondansetron 4 mg can be tried instead.  Wilfrid Lund, MD  ____________________________________________________________

## 2021-02-20 ENCOUNTER — Telehealth: Payer: Self-pay

## 2021-02-20 ENCOUNTER — Other Ambulatory Visit: Payer: Self-pay

## 2021-02-20 DIAGNOSIS — R1010 Upper abdominal pain, unspecified: Secondary | ICD-10-CM

## 2021-02-20 DIAGNOSIS — R110 Nausea: Secondary | ICD-10-CM

## 2021-02-20 NOTE — Telephone Encounter (Signed)
See addendum of recent visit  with Alexander Savoy, NP  Contacted the patient. Discussed the endoscopy. Verbal review of instructions. Confirmed he is not on supplemental oxygen, does not take anti-coagulation medications, confirmed his weight and allergies. Scheduled at his request, for mid January with Dr Loletha Carrow. Instructions mailed.

## 2021-02-20 NOTE — Telephone Encounter (Deleted)
-----   Message from Willia Craze, NP sent at 02/17/2021  6:19 PM EST -----    ----- Message ----- From: Doran Stabler, MD Sent: 02/17/2021   6:10 AM EST To: Willia Craze, NP     ----- Message ----- From: Willia Craze, NP Sent: 02/14/2021   5:38 PM EST To: Doran Stabler, MD

## 2021-02-21 ENCOUNTER — Other Ambulatory Visit: Payer: Self-pay | Admitting: Oncology

## 2021-02-23 ENCOUNTER — Inpatient Hospital Stay: Payer: Medicare PPO | Admitting: Oncology

## 2021-02-23 ENCOUNTER — Other Ambulatory Visit: Payer: Self-pay

## 2021-02-23 ENCOUNTER — Inpatient Hospital Stay: Payer: Medicare PPO | Attending: Oncology

## 2021-02-23 VITALS — BP 151/83 | HR 72 | Temp 97.9°F | Resp 16 | Ht 71.0 in | Wt 186.7 lb

## 2021-02-23 DIAGNOSIS — C61 Malignant neoplasm of prostate: Secondary | ICD-10-CM

## 2021-02-23 DIAGNOSIS — Z79899 Other long term (current) drug therapy: Secondary | ICD-10-CM | POA: Insufficient documentation

## 2021-02-23 DIAGNOSIS — Z5111 Encounter for antineoplastic chemotherapy: Secondary | ICD-10-CM | POA: Diagnosis not present

## 2021-02-23 MED ORDER — LEUPROLIDE ACETATE 7.5 MG ~~LOC~~ KIT
7.5000 mg | PACK | Freq: Once | SUBCUTANEOUS | Status: AC
  Administered 2021-02-23: 7.5 mg via SUBCUTANEOUS
  Filled 2021-02-23: qty 7.5

## 2021-02-23 NOTE — Progress Notes (Signed)
Hematology and Oncology Follow Up Visit  Yehya Brendle [id number] 05/16/1940 80 y.o. 02/23/2021 1:01 PM Bedsole, [name], [name], [name], MD   Principle Diagnosis: 80 year old man with prostate cancer presented with Gleason score 6 and a PSA of 3.17 in 2009.  He had developed local recurrence within the prostate.    Prior Therapy: Observation and surveillance between 2009 in 2011.  He was found to have a Gleason score 4+4 = 8 in 2011. He received radiation therapy between October 2011 until December 2011.   He received concomitant Taxotere chemotherapy as a part of a clinical trial.  He also received 6 months of hormone therapy up until July 2012. His PSA nadir was down to 0.1  In December 2013.   His PSA continue to be low close to 0.1 and 0.2  Up till December 2015.   In June 2016 was up to 0.4 and his most recent PSA in September 2016 was up to 0.7.   Androgen deprivation therapy with Lupron at 7.5 mg on a monthly basis started in March 2018.  Last injection will be given on January 08, 2017.   Current therapy:     Eligard 7.5 mg monthly restarted in August 2019.   Last injection given in November 2022.   Interim History:  Mr. Dunnaway presents today for repeat follow-up visit.  Since the last visit, He reports feeling well without complaints. He reports no dysuria, frequency or pelvic pain. He reports mild nausea and abdominal discomfort.                 Medications: Reviewed without changes. Current Outpatient Medications  Medication Sig Dispense Refill   ACCU-CHEK AVIVA PLUS test strip USE TO CHECK BLOOD SUGAR 2 TIMES A DAY. DX: E11.49 100 strip 11   amLODipine (NORVASC) 5 MG tablet Take 1 tablet (5 mg total) by mouth daily. 30 tablet 11   aspirin 81 MG tablet Take 81 mg by mouth every other day.     atorvastatin (LIPITOR) 40 MG tablet TAKE 1 TABLET BY MOUTH ONCE DAILY 90 tablet 3   Blood Glucose Monitoring Suppl (ACCU-CHEK AVIVA PLUS) w/Device KIT Use to  check blood sugar 2 times a day.  Dx: E11.49 1 kit 0   CALCIUM-VITAMIN D PO Take 1 tablet by mouth daily.     CINNAMON PO Take 1 capsule by mouth daily.     dicyclomine (BENTYL) 10 MG capsule Take 1 capsule (10 mg total) by mouth 2 (two) times daily as needed for spasms. 180 capsule 0   famotidine (PEPCID) 20 MG tablet Take 1 tablet (20 mg total) by mouth at bedtime. 30 tablet 3   insulin glargine (LANTUS) 100 UNIT/ML Solostar Pen Inject 28 Units into the skin daily.     Insulin Pen Needle (PEN NEEDLES) 32G X 4 MM MISC Use to inject insulin daily 100 each 3   ketoconazole (NIZORAL) 2 % cream Apply 1 application topically daily as needed for irritation.     Lancets (ACCU-CHEK SOFT TOUCH) lancets Use to check blood sugar 2 times a day.  Dx: E11.49 100 each 11   Lancets Misc. (ACCU-CHEK SOFTCLIX LANCET DEV) KIT Use to check blood sugar 2 times a day.  Dx: E11.49 1 kit 0   Leuprolide Acetate, 3 Month, (ELIGARD) 22.5 MG injection Inject 22.5 mg into the skin every 30 (thirty) days.      losartan (COZAAR) 100 MG tablet TAKE 1 TABLET BY MOUTH ONCE DAILY 90 tablet 3  prochlorperazine (COMPAZINE) 10 MG tablet Take 1 tablet (10 mg total) by mouth every 6 (six) hours as needed for nausea or vomiting. 30 tablet 1   No current facility-administered medications for this visit.     Allergies: No Known Allergies      Physical exam:       Blood pressure (!) 151/83, pulse 72, temperature 97.9 F (36.6 C), temperature source Temporal, resp. rate 16, height _0  (1.803 m), weight 186 lb 11.2 oz (84.7 kg), SpO2 100 %.        ECOG 0.      General appearance: Comfortable appearing without any discomfort Head: Normocephalic without any trauma Oropharynx: Mucous membranes are moist and pink without any thrush or ulcers. Eyes: Pupils are equal and round reactive to light. Lymph nodes: No cervical, supraclavicular, inguinal or axillary lymphadenopathy.   Heart:regular rate and rhythm.  S1  and S2 without leg edema. Lung: Clear without any rhonchi or wheezes.  No dullness to percussion. Abdomin: Soft, nontender, nondistended with good bowel sounds.  No hepatosplenomegaly. Musculoskeletal: No joint deformity or effusion.  Full range of motion noted. Neurological: No deficits noted on motor, sensory and deep tendon reflex exam. Skin: No petechial rash or dryness.  Appeared moist.         PSA on 04/20/2015 was 2.6.  PSA on 07/19/2015 was 3.7. PSA on 10/18/2015 was 3.2. PSA on 01/11/2016 was 4.4. PSA on 04/12/2016 was 8.2. Lupron started in March 2018. PSA on 08/23/2016 was 0.7 PSA in August 2018 was 0.7. PSA in 12/2016 0.7.  Testosterone was less than 3. PSA on April 03, 2017 was 1.5. PSA on July 02, 2017 was 5.7. PSA on 10/02/2017 was 13.7 PSA 01/02/3018 3.9 PSA on April 03, 2018 was 3.4. PSA on April 22 of 2020 was 3.0. PSA on October 24, 2018 was 2.6 PSA on January 26, 2019 was 8.5. PSA on 04/29/2019 8.3 PSA on 07/30/2019 15.7 PSA on 11/02/2019 9.3 testosterone that is less than 3. PSA on February 02, 2020 was 8.3. PSA on May 04, 2020 was 7.5. PSA on 08/03/2020 was 8.8. PSA 11/03/2020 10.0 PSA on February 16, 2021 was 13.3    Impression and Plan:   80 year old man with  1.  Prostate cancer with local recurrence noted with a rising PSA as well as local recurrence on imaging studies.    His disease status was updated at this time and treatment choices were reviewed.  PSA continues to rise slowly despite to androgen deprivation therapy.  Imaging studies including PET scan obtained and June 2022 to predominantly localized disease without any evidence of metastasis.  Cryoablation versus additional systemic therapy with androgen receptor pathway inhibitors are considered versus continued observation for the time being. He is agreeable with this plan    2.  Osteoporosis: He remains on calcium and vitamin D supplements.  3.  GI complaints: He continues to  follow with GI regarding this issues. He will have an endoscopy as well.    4. Follow-up: Monthly for Eligard and MD follow-up in 3 months.  30  minutes were dedicated to this visit.  The time was spent on reviewing laboratory data, disease status update as well as updating treatment choices currently and for the future.  Zola Button, MD 12/15/20221:01 PM

## 2021-02-24 ENCOUNTER — Encounter: Payer: Self-pay | Admitting: Gastroenterology

## 2021-02-27 NOTE — Telephone Encounter (Signed)
I made a note in the referral about insurance change to [facility].  Precert will not be required for [facility] in an ambulatory center. It is only required in a hospital setting.  Insurance verifications are done about 1-2 weeks prior to procedure because of such small auth windows from [employer]. Thanks, Amy

## 2021-03-03 ENCOUNTER — Encounter: Payer: Self-pay | Admitting: Family Medicine

## 2021-03-03 ENCOUNTER — Telehealth: Payer: Self-pay | Admitting: Oncology

## 2021-03-03 ENCOUNTER — Other Ambulatory Visit: Payer: Self-pay | Admitting: Family Medicine

## 2021-03-03 NOTE — Telephone Encounter (Signed)
Sch per 12/15 los, left msg

## 2021-03-09 DIAGNOSIS — S42035A Nondisplaced fracture of lateral end of left clavicle, initial encounter for closed fracture: Secondary | ICD-10-CM | POA: Diagnosis not present

## 2021-03-09 DIAGNOSIS — M25512 Pain in left shoulder: Secondary | ICD-10-CM | POA: Diagnosis not present

## 2021-03-17 ENCOUNTER — Encounter: Payer: Self-pay | Admitting: Oncology

## 2021-03-21 ENCOUNTER — Telehealth: Payer: Self-pay | Admitting: Oncology

## 2021-03-21 NOTE — Telephone Encounter (Signed)
Sch per 1/10 inbasket, pt aware °

## 2021-03-23 ENCOUNTER — Encounter: Payer: Self-pay | Admitting: Oncology

## 2021-03-23 ENCOUNTER — Encounter: Payer: Self-pay | Admitting: Gastroenterology

## 2021-03-24 ENCOUNTER — Ambulatory Visit: Payer: Medicare PPO

## 2021-03-24 ENCOUNTER — Other Ambulatory Visit: Payer: Self-pay

## 2021-03-24 ENCOUNTER — Inpatient Hospital Stay: Payer: Medicare Other | Attending: Oncology

## 2021-03-24 VITALS — BP 151/78 | HR 86 | Temp 97.9°F | Resp 18

## 2021-03-24 DIAGNOSIS — Z5111 Encounter for antineoplastic chemotherapy: Secondary | ICD-10-CM | POA: Diagnosis present

## 2021-03-24 DIAGNOSIS — C61 Malignant neoplasm of prostate: Secondary | ICD-10-CM | POA: Insufficient documentation

## 2021-03-24 MED ORDER — LEUPROLIDE ACETATE 7.5 MG ~~LOC~~ KIT
7.5000 mg | PACK | Freq: Once | SUBCUTANEOUS | Status: AC
  Administered 2021-03-24: 7.5 mg via SUBCUTANEOUS
  Filled 2021-03-24: qty 7.5

## 2021-03-25 ENCOUNTER — Encounter: Payer: Self-pay | Admitting: Family Medicine

## 2021-03-27 ENCOUNTER — Encounter: Payer: Self-pay | Admitting: Oncology

## 2021-03-29 MED ORDER — AMLODIPINE BESYLATE 5 MG PO TABS: 5.0000 mg | ORAL_TABLET | Freq: Every day | ORAL | 3 refills | Status: DC

## 2021-03-31 ENCOUNTER — Other Ambulatory Visit: Payer: Self-pay | Admitting: Gastroenterology

## 2021-03-31 ENCOUNTER — Encounter: Payer: Self-pay | Admitting: Gastroenterology

## 2021-03-31 ENCOUNTER — Ambulatory Visit (AMBULATORY_SURGERY_CENTER): Payer: Medicare Other | Admitting: Gastroenterology

## 2021-03-31 VITALS — BP 123/65 | HR 72 | Temp 96.2°F | Resp 14 | Ht 71.0 in | Wt 187.0 lb

## 2021-03-31 DIAGNOSIS — R140 Abdominal distension (gaseous): Secondary | ICD-10-CM

## 2021-03-31 DIAGNOSIS — R110 Nausea: Secondary | ICD-10-CM

## 2021-03-31 MED ORDER — SODIUM CHLORIDE 0.9 % IV SOLN: 500.0000 mL | Freq: Once | INTRAVENOUS | Status: DC

## 2021-03-31 NOTE — Progress Notes (Signed)
A and O x3. Report to RN. Tolerated MAC anesthesia well.Teeth unchanged after procedure. 

## 2021-03-31 NOTE — Progress Notes (Signed)
Pt's states no medical or surgical changes since previsit or office visit. Vs by DT.

## 2021-03-31 NOTE — Progress Notes (Signed)
Called to room to assist during endoscopic procedure.  Patient ID and intended procedure confirmed with present staff. Received instructions for my participation in the procedure from the performing physician.  

## 2021-03-31 NOTE — Patient Instructions (Addendum)
Handout on gastritis provided.  Await pathology results.  YOU HAD AN ENDOSCOPIC PROCEDURE TODAY AT [facility]:   Refer to the procedure report that was given to you for any specific questions about what was found during the examination.  If the procedure report does not answer your questions, please call your gastroenterologist to clarify.  If you requested that your care partner not be given the details of your procedure findings, then the procedure report has been included in a sealed envelope for you to review at your convenience later.  YOU SHOULD EXPECT: Some feelings of bloating in the abdomen. Passage of more gas than usual.  Walking can help get rid of the air that was put into your GI tract during the procedure and reduce the bloating. If you had a lower endoscopy (such as a colonoscopy or flexible sigmoidoscopy) you may notice spotting of blood in your stool or on the toilet paper. If you underwent a bowel prep for your procedure, you may not have a normal bowel movement for a few days.  Please Note:  You might notice some irritation and congestion in your nose or some drainage.  This is from the oxygen used during your procedure.  There is no need for concern and it should clear up in a day or so.  SYMPTOMS TO REPORT IMMEDIATELY:  Following upper endoscopy (EGD)  Vomiting of blood or coffee ground material  New chest pain or pain under the shoulder blades  Painful or persistently difficult swallowing  New shortness of breath  Fever of 100F or higher  Black, tarry-looking stools  For urgent or emergent issues, a gastroenterologist can be reached at any hour by calling [phone]. Do not use MyChart messaging for urgent concerns.    DIET:  We do recommend a small meal at first, but then you may proceed to your regular diet.  Drink plenty of fluids but you should avoid alcoholic beverages for 24 hours.  ACTIVITY:  You should plan to take it easy for the rest  of today and you should NOT DRIVE or use heavy machinery until tomorrow (because of the sedation medicines used during the test).    FOLLOW UP: Our staff will call the number listed on your records 48-72 hours following your procedure to check on you and address any questions or concerns that you may have regarding the information given to you following your procedure. If we do not reach you, we will leave a message.  We will attempt to reach you two times.  During this call, we will ask if you have developed any symptoms of COVID 19. If you develop any symptoms (ie: fever, flu-like symptoms, shortness of breath, cough etc.) before then, please call [phone].  If you test positive for Covid 19 in the 2 weeks post procedure, please call and report this information to [place].    If any biopsies were taken you will be contacted by phone or by letter within the next 1-3 weeks.  Please call us at [phone] if you have not heard about the biopsies in 3 weeks.    SIGNATURES/CONFIDENTIALITY: You and/or your care partner have signed paperwork which will be entered into your electronic medical record.  These signatures attest to the fact that that the information above on your After Visit Summary has been reviewed and is understood.  Full responsibility of the confidentiality of this discharge information lies with you and/or your care-partner.

## 2021-03-31 NOTE — Progress Notes (Signed)
History and Physical:  This patient presents for endoscopic testing for: Encounter Diagnosis  Name Primary?   Nausea in adult Yes  Bloating  Clinical details in 02/14/21 office note. Patient reports symptoms remain similar since then.   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 DX: E11.49 100 strip 11   amLODipine (NORVASC) 5 MG tablet Take 1 tablet (5 mg total) by mouth daily. 90 tablet 3   aspirin 81 MG tablet Take 81 mg by mouth every other day.     Blood Glucose Monitoring Suppl (ACCU-CHEK AVIVA PLUS) w/Device KIT Use to check blood sugar 2 times a day.  Dx: E11.49 1 kit 0   famotidine (PEPCID) 20 MG tablet Take 1 tablet (20 mg total) by mouth at bedtime. 30 tablet 3   Insulin Pen Needle (PEN NEEDLES) 32G X 4 MM MISC Use to inject insulin daily 100 each 3   Lancets (ACCU-CHEK SOFT TOUCH) lancets Use to check blood sugar 2 times a day.  Dx: E11.49 100 each 11   Lancets Misc. (ACCU-CHEK SOFTCLIX LANCET DEV) KIT Use to check blood sugar 2 times a day.  Dx: E11.49 1 kit 0   LANTUS SOLOSTAR 100 UNIT/ML Solostar Pen INJECT 25 UNITS INTO THE SKIN DAILY 15 mL 1   losartan (COZAAR) 100 MG tablet TAKE 1 TABLET BY MOUTH  ONCE DAILY 90 tablet 3   atorvastatin (LIPITOR) 40 MG tablet TAKE 1 TABLET BY MOUTH ONCE DAILY (Patient not taking: Reported on 03/31/2021) 90 tablet 3   CALCIUM-VITAMIN D PO Take 1 tablet by mouth daily. (Patient not taking: Reported on 03/31/2021)     CINNAMON PO Take 1 capsule by mouth daily. (Patient not taking: Reported on 03/31/2021)     dicyclomine (BENTYL) 10 MG capsule Take 1 capsule (10 mg total) by mouth 2 (two) times daily as needed for spasms. 180 capsule 0   ketoconazole (NIZORAL) 2 % cream Apply 1 application topically daily as needed for irritation.     Leuprolide Acetate, 3 Month, (ELIGARD) 22.5 MG injection Inject 22.5 mg into the skin every 30 (thirty) days.      prochlorperazine (COMPAZINE) 10 MG tablet Take 1 tablet (10 mg total) by mouth every 6 (six) hours as needed for nausea or vomiting. (Patient not taking: Reported on 03/31/2021) 30 tablet 1   Current Facility-Administered Medications  Medication Dose Route Frequency Provider Last Rate Last Admin   0.9 %  sodium chloride infusion  500 mL Intravenous Once Nelida Meuse III, MD          ___________________________________________________________________ Objective   Exam:  BP (!) 148/77    Pulse 72  Temp (!) 96.2 F (35.7 C) (Temporal)    Ht 5' 11"  (1.803 m)    Wt 187 lb (84.8 kg)    SpO2 99%    BMI 26.08 kg/m   CV: RRR without murmur, S1/S2 Resp: clear to auscultation bilaterally, normal RR and effort noted GI: soft, no tenderness, with active bowel sounds.   Assessment: Encounter Diagnosis  Name Primary?   Nausea in adult Yes  bloating   Plan: EGD  The benefits and risks of the planned procedure were described in detail with the patient or (when appropriate) their health care proxy.  Risks were outlined as including, but not limited to, bleeding, infection, perforation, adverse medication reaction leading to cardiac or pulmonary decompensation, pancreatitis (if ERCP).  The limitation of incomplete  mucosal visualization was also discussed.  No guarantees or warranties were given.    The patient is appropriate for an endoscopic procedure in the ambulatory setting.   - Wilfrid Lund, MD

## 2021-03-31 NOTE — Op Note (Signed)
[place] Patient Name: Alexander Yang Procedure Date: 03/31/2021 10:21 AM MRN: [id number] Endoscopist: Mallie Mussel L. Loletha Carrow , MD Age: 81 Referring MD:  Date of Birth: [birth year] Gender: Male Account #: [id number] Procedure:                Upper GI endoscopy Indications:              Abdominal bloating, Nausea Medicines:                Monitored Anesthesia Care Procedure:                Pre-Anesthesia Assessment:                           - Prior to the procedure, a History and Physical                            was performed, and patient medications and                            allergies were reviewed. The patient's tolerance of                            previous anesthesia was also reviewed. The risks                            and benefits of the procedure and the sedation                            options and risks were discussed with the patient.                            All questions were answered, and informed consent                            was obtained. Prior Anticoagulants: The patient has                            taken no previous anticoagulant or antiplatelet                            agents. ASA Grade Assessment: III - A patient with                            severe systemic disease. After reviewing the risks                            and benefits, the patient was deemed in                            satisfactory condition to undergo the procedure.                           After obtaining informed consent, the endoscope was  passed under direct vision. Throughout the                            procedure, the patient's blood pressure, pulse, and                            oxygen saturations were monitored continuously. The                            Endoscope was introduced through the mouth, and                            advanced to the second part of duodenum. The upper                            GI endoscopy was  accomplished without difficulty.                            The patient tolerated the procedure well. Scope In: Scope Out: Findings:                 The esophagus was normal.                           Patchy mildly erythematous mucosa was found in the                            prepyloric region of the stomach. Several biopsies                            were obtained on the greater curvature of the                            gastric body, on the lesser curvature of the                            gastric body, on the greater curvature of the                            gastric antrum and on the lesser curvature of the                            gastric antrum with cold forceps for histology.                           The exam of the stomach was otherwise normal.                           The cardia and gastric fundus were normal on                            retroflexion.  The examined duodenum was normal. Complications:            No immediate complications. Estimated Blood Loss:     Estimated blood loss was minimal. Impression:               - Normal esophagus.                           - Erythematous mucosa in the prepyloric region of                            the stomach. Minor finding - unlikely of clinical                            significance.                           - Normal examined duodenum.                           - Several biopsies were obtained on the greater                            curvature of the gastric body, on the lesser                            curvature of the gastric body, on the greater                            curvature of the gastric antrum and on the lesser                            curvature of the gastric antrum. Recommendation:           - Patient has a contact number available for                            emergencies. The signs and symptoms of potential                            delayed complications were discussed  with the                            patient. Return to normal activities tomorrow.                            Written discharge instructions were provided to the                            patient.                           - Resume previous diet.                           - Continue present medications.                           -  Await pathology results. [name] L. Loletha Carrow, MD 03/31/2021 10:53:54 AM This report has been signed electronically.

## 2021-04-04 ENCOUNTER — Telehealth: Payer: Self-pay | Admitting: *Deleted

## 2021-04-04 NOTE — Telephone Encounter (Signed)
°  Follow up Call-  Call back number 03/31/2021  Post procedure Call Back phone  # [phone]  Permission to leave phone message Yes  Some recent data might be hidden     Patient questions:  Do you have a fever, pain , or abdominal swelling? No. Pain Score  0 *  Have you tolerated food without any problems? Yes.    Have you been able to return to your normal activities? Yes.    Do you have any questions about your discharge instructions: Diet   No. Medications  No. Follow up visit  No.  Do you have questions or concerns about your Care? No.  Actions: * If pain score is 4 or above: No action needed, pain <4.  Have you developed a fever since your procedure? no  2.   Have you had an respiratory symptoms (SOB or cough) since your procedure? no  3.   Have you tested positive for COVID 19 since your procedure no  4.   Have you had any family members/close contacts diagnosed with the COVID 19 since your procedure?  no   If yes to any of these questions please route to Joylene John, RN and Joella Prince, RN

## 2021-04-05 ENCOUNTER — Encounter: Payer: Self-pay | Admitting: Family Medicine

## 2021-04-05 LAB — HM DIABETES EYE EXAM

## 2021-04-09 ENCOUNTER — Encounter: Payer: Self-pay | Admitting: Gastroenterology

## 2021-04-10 ENCOUNTER — Encounter: Payer: Self-pay | Admitting: Family Medicine

## 2021-04-17 ENCOUNTER — Encounter: Payer: Self-pay | Admitting: Family Medicine

## 2021-04-18 ENCOUNTER — Other Ambulatory Visit: Payer: Self-pay | Admitting: Family Medicine

## 2021-04-18 DIAGNOSIS — R110 Nausea: Secondary | ICD-10-CM

## 2021-04-18 NOTE — Progress Notes (Signed)
See Mychart note 

## 2021-04-19 ENCOUNTER — Encounter: Payer: Self-pay | Admitting: Family Medicine

## 2021-04-21 ENCOUNTER — Other Ambulatory Visit: Payer: Self-pay

## 2021-04-21 ENCOUNTER — Inpatient Hospital Stay: Payer: Medicare Other | Attending: Oncology

## 2021-04-21 ENCOUNTER — Other Ambulatory Visit: Payer: Self-pay | Admitting: Family Medicine

## 2021-04-21 ENCOUNTER — Ambulatory Visit: Payer: Medicare PPO

## 2021-04-21 VITALS — BP 154/78 | HR 92 | Temp 98.8°F | Resp 16

## 2021-04-21 DIAGNOSIS — C61 Malignant neoplasm of prostate: Secondary | ICD-10-CM | POA: Insufficient documentation

## 2021-04-21 DIAGNOSIS — Z5111 Encounter for antineoplastic chemotherapy: Secondary | ICD-10-CM | POA: Diagnosis not present

## 2021-04-21 DIAGNOSIS — H539 Unspecified visual disturbance: Secondary | ICD-10-CM

## 2021-04-21 MED ORDER — LEUPROLIDE ACETATE 7.5 MG ~~LOC~~ KIT
7.5000 mg | PACK | Freq: Once | SUBCUTANEOUS | Status: AC
  Administered 2021-04-21: 7.5 mg via SUBCUTANEOUS
  Filled 2021-04-21: qty 7.5

## 2021-04-21 NOTE — Patient Instructions (Signed)
Leuprolide depot injection What is this medication? LEUPROLIDE (loo PROE lide) is a man-made protein that acts like a natural hormone in the body. It decreases testosterone in men and decreases estrogen in women. In men, this medicine is used to treat advanced prostate cancer. In women, some forms of this medicine may be used to treat endometriosis, uterine fibroids, or other male hormone-related problems. This medicine may be used for other purposes; ask your health care provider or pharmacist if you have questions. COMMON BRAND NAME(S): Eligard, Fensolv, Lupron Depot, Lupron Depot-Ped, Viadur What should I tell my care team before I take this medication? They need to know if you have any of these conditions: diabetes heart disease or previous heart attack high blood pressure high cholesterol mental illness osteoporosis pain or difficulty passing urine seizures spinal cord metastasis stroke suicidal thoughts, plans, or attempt; a previous suicide attempt by you or a family member tobacco smoker unusual vaginal bleeding (women) an unusual or allergic reaction to leuprolide, benzyl alcohol, other medicines, foods, dyes, or preservatives pregnant or trying to get pregnant breast-feeding How should I use this medication? This medicine is for injection into a muscle or for injection under the skin. It is given by a health care professional in a hospital or clinic setting. The specific product will determine how it will be given to you. Make sure you understand which product you receive and how often you will receive it. Talk to your pediatrician regarding the use of this medicine in children. Special care may be needed. Overdosage: If you think you have taken too much of this medicine contact a poison control center or emergency room at once. NOTE: This medicine is only for you. Do not share this medicine with others. What if I miss a dose? It is important not to miss a dose. Call your  doctor or health care professional if you are unable to keep an appointment. Depot injections: Depot injections are given either once-monthly, every 12 weeks, every 16 weeks, or every 24 weeks depending on the product you are prescribed. The product you are prescribed will be based on if you are male or male, and your condition. Make sure you understand your product and dosing. What may interact with this medication? Do not take this medicine with any of the following medications: chasteberry cisapride dronedarone pimozide thioridazine This medicine may also interact with the following medications: herbal or dietary supplements, like black cohosh or DHEA male hormones, like estrogens or progestins and birth control pills, patches, rings, or injections male hormones, like testosterone other medicines that prolong the QT interval (abnormal heart rhythm) This list may not describe all possible interactions. Give your health care provider a list of all the medicines, herbs, non-prescription drugs, or dietary supplements you use. Also tell them if you smoke, drink alcohol, or use illegal drugs. Some items may interact with your medicine. What should I watch for while using this medication? Visit your doctor or health care professional for regular checks on your progress. During the first weeks of treatment, your symptoms may get worse, but then will improve as you continue your treatment. You may get hot flashes, increased bone pain, increased difficulty passing urine, or an aggravation of nerve symptoms. Discuss these effects with your doctor or health care professional, some of them may improve with continued use of this medicine. Male patients may experience a menstrual cycle or spotting during the first months of therapy with this medicine. If this continues, contact your doctor or  health care professional. This medicine may increase blood sugar. Ask your healthcare provider if changes in diet  or medicines are needed if you have diabetes. What side effects may I notice from receiving this medication? Side effects that you should report to your doctor or health care professional as soon as possible: allergic reactions like skin rash, itching or hives, swelling of the face, lips, or tongue breathing problems chest pain depression or memory disorders pain in your legs or groin pain at site where injected or implanted seizures severe headache signs and symptoms of high blood sugar such as being more thirsty or hungry or having to urinate more than normal. You may also feel very tired or have blurry vision swelling of the feet and legs suicidal thoughts or other mood changes visual changes vomiting Side effects that usually do not require medical attention (report to your doctor or health care professional if they continue or are bothersome): breast swelling or tenderness decrease in sex drive or performance diarrhea hot flashes loss of appetite muscle, joint, or bone pains nausea redness or irritation at site where injected or implanted skin problems or acne This list may not describe all possible side effects. Call your doctor for medical advice about side effects. You may report side effects to FDA at 1-800-FDA-1088. Where should I keep my medication? This drug is given in a hospital or clinic and will not be stored at home. NOTE: This sheet is a summary. It may not cover all possible information. If you have questions about this medicine, talk to your doctor, pharmacist, or health care provider.  2022 Elsevier/Gold Standard (2020-11-15 00:00:00)

## 2021-04-22 ENCOUNTER — Encounter (INDEPENDENT_AMBULATORY_CARE_PROVIDER_SITE_OTHER): Payer: Self-pay

## 2021-04-24 ENCOUNTER — Encounter: Payer: Self-pay | Admitting: Family Medicine

## 2021-04-24 NOTE — Telephone Encounter (Signed)
No, do not place a new referral just yet. I am trying to find out from the patient whats going on.   If he hasnt called them and he is saying this all because he has not heard from them yet then we need to make attempts to reach [place] to get him scheduled before sending his referral elsewhere.   Patient was made aware on 04/20/21 that his referral was sent and to where to call to schedule.  I sent him another message to clarify what the issue is.  April 20, 2021 [name], CMA to [facility] "Alexander Yang"    11:00 AM  Good morning,    I have sent your GI referral to [facility] for their office to review.   They will contact you to schedule. If you haven't heard anything from within 1 week, You may also give their office a call in a few days to schedule.    [facility] - Dr. Venancio Poisson, M.D [address] [place], [place] [address] Phone: [phone]   If there are any issues with this please let us know.     Have a good day! [name] Referral Coordinator/CMA  Last read by Alexander Yang "Alexander Yang" at 11:11 AM on 04/20/2021.

## 2021-04-25 ENCOUNTER — Other Ambulatory Visit: Payer: Self-pay | Admitting: Family Medicine

## 2021-04-25 DIAGNOSIS — E0840 Diabetes mellitus due to underlying condition with diabetic neuropathy, unspecified: Secondary | ICD-10-CM

## 2021-04-27 ENCOUNTER — Encounter: Payer: Self-pay | Admitting: Family Medicine

## 2021-04-27 ENCOUNTER — Encounter: Payer: Self-pay | Admitting: Gastroenterology

## 2021-04-27 MED ORDER — LANTUS SOLOSTAR 100 UNIT/ML ~~LOC~~ SOPN: PEN_INJECTOR | SUBCUTANEOUS | 1 refills | Status: DC

## 2021-04-28 ENCOUNTER — Encounter: Payer: Self-pay | Admitting: *Deleted

## 2021-04-28 NOTE — Telephone Encounter (Signed)
No I will continue off the current referral  Thanks

## 2021-05-02 ENCOUNTER — Encounter: Payer: Self-pay | Admitting: Gastroenterology

## 2021-05-02 MED ORDER — METRONIDAZOLE 500 MG PO TABS: 500.0000 mg | ORAL_TABLET | Freq: Two times a day (BID) | ORAL | 0 refills | Status: AC

## 2021-05-02 NOTE — Telephone Encounter (Signed)
Metronidazole 500 mg One tablet twice daily x 10 days Disp# 20 tablets, refill zero

## 2021-05-02 NOTE — Telephone Encounter (Signed)
RX for Metronidazole has been sent to requested pharmacy. Pt notified via my chart.

## 2021-05-08 ENCOUNTER — Encounter: Payer: Self-pay | Admitting: Gastroenterology

## 2021-05-08 ENCOUNTER — Telehealth: Payer: Self-pay | Admitting: Family Medicine

## 2021-05-08 DIAGNOSIS — E1140 Type 2 diabetes mellitus with diabetic neuropathy, unspecified: Secondary | ICD-10-CM

## 2021-05-08 NOTE — Telephone Encounter (Signed)
-----   Message from Ellamae Sia sent at 05/05/2021  2:56 PM EST ----- Regarding: Lab orders for Tuesday, 3.7.2023 Patient is scheduled for CPX labs, please order future labs, Thanks , Karna Christmas

## 2021-05-11 ENCOUNTER — Encounter: Payer: Self-pay | Admitting: Oncology

## 2021-05-12 ENCOUNTER — Encounter: Payer: Self-pay | Admitting: Family Medicine

## 2021-05-15 ENCOUNTER — Encounter: Payer: Self-pay | Admitting: Family Medicine

## 2021-05-16 ENCOUNTER — Other Ambulatory Visit (INDEPENDENT_AMBULATORY_CARE_PROVIDER_SITE_OTHER): Payer: Medicare Other

## 2021-05-16 ENCOUNTER — Other Ambulatory Visit: Payer: Self-pay

## 2021-05-16 DIAGNOSIS — E1140 Type 2 diabetes mellitus with diabetic neuropathy, unspecified: Secondary | ICD-10-CM

## 2021-05-16 LAB — LIPID PANEL
Cholesterol: 131 mg/dL (ref 0–200)
HDL: 47.9 mg/dL (ref >39.00)
LDL Cholesterol: 70 mg/dL (ref 0–99)
NonHDL: 83.43
Total CHOL/HDL Ratio: 3
Triglycerides: 67 mg/dL (ref 0.0–149.0)
VLDL: 13.4 mg/dL (ref 0.0–40.0)

## 2021-05-16 LAB — COMPREHENSIVE METABOLIC PANEL
ALT: 20 U/L (ref 0–53)
AST: 20 U/L (ref 0–37)
Albumin: 3.9 g/dL (ref 3.5–5.2)
Alkaline Phosphatase: 42 U/L (ref 39–117)
BUN: 21 mg/dL (ref 6–23)
CO2: 30 mEq/L (ref 19–32)
Calcium: 9.3 mg/dL (ref 8.4–10.5)
Chloride: 104 mEq/L (ref 96–112)
Creatinine, Ser: 0.86 mg/dL (ref 0.40–1.50)
GFR: 81.91 mL/min (ref >60.00)
Glucose, Bld: 153 mg/dL — ABNORMAL HIGH (ref 70–99)
Potassium: 4.3 mEq/L (ref 3.5–5.1)
Sodium: 140 mEq/L (ref 135–145)
Total Bilirubin: 0.7 mg/dL (ref 0.2–1.2)
Total Protein: 6.6 g/dL (ref 6.0–8.3)

## 2021-05-16 LAB — HEMOGLOBIN A1C: Hgb A1c MFr Bld: 7.7 % — ABNORMAL HIGH (ref 4.6–6.5)

## 2021-05-16 NOTE — Progress Notes (Signed)
No critical labs need to be addressed urgently. We will discuss labs in detail at upcoming office visit.   

## 2021-05-17 ENCOUNTER — Telehealth: Payer: Self-pay | Admitting: *Deleted

## 2021-05-17 NOTE — Telephone Encounter (Addendum)
FYI ?Facsimile received from [place], [place] Ph: [phone] ext. [phone], Fax: [phone]. ? ?Alexander Yang is being seen in this office tomorrow 05/18/2021 with Dr. Carlean Jews.  Request last 2 office notes from Med/Onc, most recent imaging reports (CT, MRI, PET scans), any pathology reports.  ? ? ?

## 2021-05-19 ENCOUNTER — Ambulatory Visit: Payer: Medicare PPO | Admitting: Oncology

## 2021-05-19 ENCOUNTER — Ambulatory Visit: Payer: Medicare PPO

## 2021-05-19 ENCOUNTER — Encounter: Payer: Self-pay | Admitting: Oncology

## 2021-05-23 ENCOUNTER — Encounter: Payer: Self-pay | Admitting: Family Medicine

## 2021-05-23 ENCOUNTER — Other Ambulatory Visit: Payer: Self-pay

## 2021-05-23 ENCOUNTER — Ambulatory Visit (INDEPENDENT_AMBULATORY_CARE_PROVIDER_SITE_OTHER): Payer: Medicare Other | Admitting: Family Medicine

## 2021-05-23 ENCOUNTER — Encounter: Payer: Self-pay | Admitting: *Deleted

## 2021-05-23 VITALS — BP 134/60 | HR 72 | Temp 98.2°F | Ht 71.0 in | Wt 187.6 lb

## 2021-05-23 DIAGNOSIS — R140 Abdominal distension (gaseous): Secondary | ICD-10-CM | POA: Diagnosis not present

## 2021-05-23 DIAGNOSIS — E1140 Type 2 diabetes mellitus with diabetic neuropathy, unspecified: Secondary | ICD-10-CM | POA: Diagnosis not present

## 2021-05-23 DIAGNOSIS — C61 Malignant neoplasm of prostate: Secondary | ICD-10-CM

## 2021-05-23 DIAGNOSIS — E1142 Type 2 diabetes mellitus with diabetic polyneuropathy: Secondary | ICD-10-CM

## 2021-05-23 DIAGNOSIS — E1159 Type 2 diabetes mellitus with other circulatory complications: Secondary | ICD-10-CM | POA: Diagnosis not present

## 2021-05-23 DIAGNOSIS — E1169 Type 2 diabetes mellitus with other specified complication: Secondary | ICD-10-CM

## 2021-05-23 DIAGNOSIS — Z0000 Encounter for general adult medical examination without abnormal findings: Secondary | ICD-10-CM | POA: Diagnosis not present

## 2021-05-23 NOTE — Assessment & Plan Note (Signed)
Stable, chronic.  Continue current medication. ? ? ?Atorvastatin 40 mg once daily ?

## 2021-05-23 NOTE — Progress Notes (Signed)
? ? Patient ID: Alexander Yang, male    DOB: 1940-09-21, 81 y.o.   MRN: [id number] ? ?This visit was conducted in person. ? ?BP 134/60 (BP Location: Right Arm, Patient Position: Sitting, Cuff Size: Large)   Pulse 72   Temp 98.2 ?F (36.8 ?C) (Oral)   Ht 5' 11"  (1.803 m)   Wt 187 lb 9.6 oz (85.1 kg)   SpO2 97%   BMI 26.16 kg/m?   ? ?CC:  ?Chief Complaint  ?Patient presents with  ? medicare wellness  ? ? ?Subjective:  ? ?HPI: ?Alexander Yang is a 81 y.o. male presenting on 05/23/2021 for medicare wellness ? ?The patient presents for annual medicare wellness, complete physical and review of chronic health problems. He/She also has the following acute concerns today: ?  GI issues ? ?I have personally reviewed the Medicare Annual Wellness questionnaire and have noted ?1. The patient's medical and social history ?2. Their use of alcohol, tobacco or illicit drugs ?3. Their current medications and supplements ?4. The patient's functional ability including ADL's, fall risks, home safety risks and hearing or visual ?            impairment. ?5. Diet and physical activities ?6. Evidence for depression or mood disorders ?7.         Updated provider list ?Cognitive evaluation was performed and recorded on pt medicare questionnaire form. ?The patients weight, height, BMI and visual acuity have been recorded in the chart   ?I have made referrals, counseling and provided education to the patient based review of the above and I have provided the pt with a written personalized care plan for preventive services.  ? Documentation of this information was scanned into the electronic record under the media tab. ? ?   ?Diabetes:   He is now off metformin  as it may have worsened GI issues. ? On lantus 30 units alone. ? Wilder Glade not tolerated given nausea ?SE to trulicity in past, severe. ?Lab Results  ?Component Value Date  ? HGBA1C 7.7 (H) 05/16/2021  ?Using medications without difficulties: ?Hypoglycemic episodes:none ?Hyperglycemic  episodes: none ?Feet problems: no ulcers ?Blood Sugars averaging: FBS 120-130 ?eye exam within last year: ? ?GI issues:   Epigastric ache, no heartburn.  Feels like an ache more than a pain, feels like pressure, feeling like her has to belch.. feels better for awhile after burping. ? It has effected his quality of life... he doesn't want to eat. ? Mild nausea, normal BMs ( occ once every 3 weeks explosive diarrhea) ? No blood in  stool. ? ?Hypertension:    Good control on amlodipine 5 mg daily losartan 100 mg daily. ?BP Readings from Last 3 Encounters:  ?05/23/21 134/60  ?04/21/21 (!) 154/78  ?03/31/21 123/65  ?Using medication without problems or lightheadedness:  none ?Chest pain with exertion:none ?Edema: none ?Short of breath: none ?Average home BPs: ?Other issues: ?Wt Readings from Last 3 Encounters:  ?05/23/21 187 lb 9.6 oz (85.1 kg)  ?03/31/21 187 lb (84.8 kg)  ?02/23/21 186 lb 11.2 oz (84.7 kg)  ? ? ? ? Prostate cancer: PSA has gone from 12 to 25 in last 3 months. ? Now going to [place]  in [place]... plan trying different hormone therapy to see if could be causing SE. ? ?Elevated Cholesterol: LDL at goal on atorvastatin 40 mg daily ?Lab Results  ?Component Value Date  ? CHOL 131 05/16/2021  ? HDL 47.90 05/16/2021  ? [place] 70 05/16/2021  ? TRIG 67.0 05/16/2021  ?  CHOLHDL 3 05/16/2021  ?Using medications without problems: ?Muscle aches:  ?Diet compliance: moderate ?Exercise: walking ?Other complaints: ? ? ?Relevant past medical, surgical, family and social history reviewed and updated as indicated. Interim medical history since our last visit reviewed. ?Allergies and medications reviewed and updated. ?Outpatient Medications Prior to Visit  ?Medication Sig Dispense Refill  ? ACCU-CHEK AVIVA PLUS test strip USE TO CHECK BLOOD SUGAR 2 TIMES A DAY. DX: E11.49 100 strip 6  ? amLODipine (NORVASC) 5 MG tablet Take 1 tablet (5 mg total) by mouth daily. 90 tablet 3  ? aspirin 81 MG tablet Take 81 mg by mouth every  other day.    ? atorvastatin (LIPITOR) 40 MG tablet TAKE 1 TABLET BY MOUTH ONCE DAILY 90 tablet 3  ? Blood Glucose Monitoring Suppl (ACCU-CHEK AVIVA PLUS) w/Device KIT Use to check blood sugar 2 times a day.  Dx: E11.49 1 kit 0  ? CALCIUM-VITAMIN D PO Take 1 tablet by mouth daily.    ? CINNAMON PO Take 1 capsule by mouth daily.    ? insulin glargine (LANTUS SOLOSTAR) 100 UNIT/ML Solostar Pen INJECT 30 UNITS INTO THE SKIN DAILY 15 mL 1  ? Insulin Pen Needle (PEN NEEDLES) 32G X 4 MM MISC Use to inject insulin daily 100 each 3  ? ketoconazole (NIZORAL) 2 % cream Apply 1 application topically daily as needed for irritation.    ? Lancets (ACCU-CHEK SOFT TOUCH) lancets Use to check blood sugar 2 times a day.  Dx: E11.49 100 each 11  ? Lancets Misc. (ACCU-CHEK SOFTCLIX LANCET DEV) KIT Use to check blood sugar 2 times a day.  Dx: E11.49 1 kit 0  ? Leuprolide Acetate, 3 Month, (ELIGARD) 22.5 MG injection Inject 22.5 mg into the skin every 30 (thirty) days.     ? losartan (COZAAR) 100 MG tablet TAKE 1 TABLET BY MOUTH ONCE DAILY 90 tablet 3  ? dicyclomine (BENTYL) 10 MG capsule Take 1 capsule (10 mg total) by mouth 2 (two) times daily as needed for spasms. 180 capsule 0  ? famotidine (PEPCID) 20 MG tablet Take 1 tablet (20 mg total) by mouth at bedtime. 30 tablet 3  ? prochlorperazine (COMPAZINE) 10 MG tablet Take 1 tablet (10 mg total) by mouth every 6 (six) hours as needed for nausea or vomiting. 30 tablet 1  ? ?No facility-administered medications prior to visit.  ?  ? ?Per HPI unless specifically indicated in ROS section below ?Review of Systems  ?Constitutional:  Negative for fatigue and fever.  ?HENT:  Negative for ear pain.   ?Eyes:  Negative for pain.  ?Respiratory:  Negative for cough and shortness of breath.   ?Cardiovascular:  Negative for chest pain, palpitations and leg swelling.  ?Gastrointestinal:  Positive for abdominal distention. Negative for abdominal pain.  ?Genitourinary:  Negative for dysuria.   ?Musculoskeletal:  Negative for arthralgias.  ?Neurological:  Negative for syncope, light-headedness and headaches.  ?Psychiatric/Behavioral:  Negative for dysphoric mood.   ?Objective:  ?BP 134/60 (BP Location: Right Arm, Patient Position: Sitting, Cuff Size: Large)   Pulse 72   Temp 98.2 ?F (36.8 ?C) (Oral)   Ht 5' 11"  (1.803 m)   Wt 187 lb 9.6 oz (85.1 kg)   SpO2 97%   BMI 26.16 kg/m?   ?Wt Readings from Last 3 Encounters:  ?05/23/21 187 lb 9.6 oz (85.1 kg)  ?03/31/21 187 lb (84.8 kg)  ?02/23/21 186 lb 11.2 oz (84.7 kg)  ?  ?  ?Physical Exam ?Constitutional:   ?  General: He is not in acute distress. ?   Appearance: Normal appearance. He is well-developed. He is not ill-appearing or toxic-appearing.  ?HENT:  ?   Head: Normocephalic and atraumatic.  ?   Right Ear: Hearing, tympanic membrane, ear canal and external ear normal.  ?   Left Ear: Hearing, tympanic membrane, ear canal and external ear normal.  ?   Nose: Nose normal.  ?   Mouth/Throat:  ?   Pharynx: Uvula midline.  ?Eyes:  ?   General: Lids are normal. Lids are everted, no foreign bodies appreciated.  ?   Conjunctiva/sclera: Conjunctivae normal.  ?   Pupils: Pupils are equal, round, and reactive to light.  ?Neck:  ?   Thyroid: No thyroid mass or thyromegaly.  ?   Vascular: No carotid bruit.  ?   Trachea: Trachea and phonation normal.  ?Cardiovascular:  ?   Rate and Rhythm: Normal rate and regular rhythm.  ?   Pulses: Normal pulses.  ?   Heart sounds: S1 normal and S2 normal. No murmur heard. ?  No gallop.  ?Pulmonary:  ?   Breath sounds: Normal breath sounds. No wheezing, rhonchi or rales.  ?Abdominal:  ?   General: Bowel sounds are normal.  ?   Palpations: Abdomen is soft.  ?   Tenderness: There is no abdominal tenderness. There is no guarding or rebound.  ?   Hernia: No hernia is present.  ?Musculoskeletal:  ?   Cervical back: Normal range of motion and neck supple.  ?Lymphadenopathy:  ?   Cervical: No cervical adenopathy.  ?Skin: ?   General:  Skin is warm and dry.  ?   Findings: No rash.  ?Neurological:  ?   Mental Status: He is alert.  ?   Cranial Nerves: No cranial nerve deficit.  ?   Sensory: No sensory deficit.  ?   Gait: Gait normal.  ?   Deep Tendon Reflexes

## 2021-05-23 NOTE — Patient Instructions (Addendum)
Cancel medicare wellness on 05/26/2021. ?Can apply topical voltaren gel to left knee 4 times daily as needed for pain. ? Continue insulin and remain off metformin. ? We will move forward with ENDO referral as requested. ?

## 2021-05-23 NOTE — Assessment & Plan Note (Signed)
Chronic, and active treatment by oncology with hormonal therapy.  He has changed his oncologist to the [place].  They are considering changing his hormonal therapy given possible side effect of GI issues. ?

## 2021-05-23 NOTE — Assessment & Plan Note (Signed)
Chronic, due to the diabetes ? ?He does have symptoms of tingling and numbness in lower extremities as well as occasionally upper extremities.  I have notified him that better control of diabetes is likely the best way to keep this from progressing.  He denies pain and therefore is not interested in a trial of gabapentin or Lyrica ?

## 2021-05-23 NOTE — Assessment & Plan Note (Signed)
Poor control.  This really affects his quality of life per the patient.  He states it is also affecting his marriage as he never wants to go out. ?He has a referral to [facility] in progress.  He wishes to determine if an endocrinologist believes that the endocrine system could be connected to his symptoms. ?He is going to try a different hormone blocker to see if this is the cause of his symptoms. ?Symptoms may have been worse with metformin so he will remain off although they have not resolved completely. ?

## 2021-05-23 NOTE — Assessment & Plan Note (Signed)
Stable, chronic.  Continue current medication.    

## 2021-05-23 NOTE — Assessment & Plan Note (Addendum)
Stable, chronic.   A1C goal given age is < 8... currently now at goal on current regimen. ? ?He will remain off metformin given possible side effects.  Continue Lantus 30 units. ? ?He has requested referral to endocrine for evaluation of his abdominal bloating to see if it is related to an endocrine issue. ? ? ? ?

## 2021-05-24 ENCOUNTER — Ambulatory Visit: Payer: Medicare PPO | Admitting: Oncology

## 2021-05-24 ENCOUNTER — Ambulatory Visit: Payer: Medicare PPO

## 2021-05-26 ENCOUNTER — Ambulatory Visit: Payer: Medicare PPO

## 2021-05-31 ENCOUNTER — Encounter: Payer: Self-pay | Admitting: Family Medicine

## 2021-06-08 ENCOUNTER — Ambulatory Visit: Payer: Medicare Other | Admitting: Gastroenterology

## 2021-06-18 ENCOUNTER — Encounter: Payer: Self-pay | Admitting: Gastroenterology

## 2021-06-21 ENCOUNTER — Encounter (INDEPENDENT_AMBULATORY_CARE_PROVIDER_SITE_OTHER): Payer: Self-pay

## 2021-06-21 NOTE — Telephone Encounter (Signed)
Alexander Yang, is there any way to get this patient in sooner than 5 months out? ? ?His referral was placed on 05/23/2021.  ? ?Was he placed on a cancellation list? ? ? ? ? ? ?FYI to PCP Dr [name] ?

## 2021-06-22 ENCOUNTER — Encounter: Payer: Self-pay | Admitting: Family Medicine

## 2021-06-22 DIAGNOSIS — Z79899 Other long term (current) drug therapy: Secondary | ICD-10-CM

## 2021-06-30 ENCOUNTER — Encounter: Payer: Self-pay | Admitting: Family Medicine

## 2021-07-04 ENCOUNTER — Other Ambulatory Visit: Payer: Medicare Other

## 2021-07-04 ENCOUNTER — Other Ambulatory Visit: Payer: Self-pay | Admitting: Family Medicine

## 2021-07-04 MED ORDER — METOCLOPRAMIDE HCL 10 MG PO TABS: 10.0000 mg | ORAL_TABLET | Freq: Three times a day (TID) | ORAL | 3 refills | Status: DC

## 2021-07-06 ENCOUNTER — Encounter: Payer: Self-pay | Admitting: Family Medicine

## 2021-07-12 ENCOUNTER — Encounter: Payer: Self-pay | Admitting: Family Medicine

## 2021-07-14 ENCOUNTER — Encounter: Payer: Self-pay | Admitting: Gastroenterology

## 2021-07-17 ENCOUNTER — Encounter (INDEPENDENT_AMBULATORY_CARE_PROVIDER_SITE_OTHER): Payer: Medicare Other | Admitting: Family Medicine

## 2021-07-17 DIAGNOSIS — E119 Type 2 diabetes mellitus without complications: Secondary | ICD-10-CM | POA: Diagnosis not present

## 2021-07-22 ENCOUNTER — Encounter: Payer: Self-pay | Admitting: Family Medicine

## 2021-07-30 ENCOUNTER — Encounter: Payer: Self-pay | Admitting: Gastroenterology

## 2021-07-30 ENCOUNTER — Encounter: Payer: Self-pay | Admitting: Family Medicine

## 2021-08-01 ENCOUNTER — Other Ambulatory Visit (INDEPENDENT_AMBULATORY_CARE_PROVIDER_SITE_OTHER): Payer: Medicare Other

## 2021-08-01 DIAGNOSIS — Z79899 Other long term (current) drug therapy: Secondary | ICD-10-CM

## 2021-08-01 LAB — HEPATIC FUNCTION PANEL
ALT: 18 U/L (ref 0–53)
AST: 17 U/L (ref 0–37)
Albumin: 4 g/dL (ref 3.5–5.2)
Alkaline Phosphatase: 47 U/L (ref 39–117)
Bilirubin, Direct: 0.1 mg/dL (ref 0.0–0.3)
Total Bilirubin: 0.8 mg/dL (ref 0.2–1.2)
Total Protein: 6.8 g/dL (ref 6.0–8.3)

## 2021-08-02 ENCOUNTER — Telehealth: Payer: Self-pay | Admitting: Family Medicine

## 2021-08-02 NOTE — Telephone Encounter (Signed)
Please see the MyChart message reply(ies) for my assessment and plan.  The patient gave consent for this Medical Advice Message and is aware that it may result in a bill to their insurance company as well as the possibility that this may result in a co-payment or deductible. They are an established patient, but are not seeking medical advice exclusively about a problem treated during an in person or video visit in the last 7 days. I did not recommend an in person or video visit within 7 days of my reply.  I spent a total of  15 minutes cumulative time within 7 days through [employer] Eliezer Lofts, MD

## 2021-08-02 NOTE — Telephone Encounter (Signed)
error 

## 2021-08-04 ENCOUNTER — Other Ambulatory Visit: Payer: Self-pay | Admitting: Family Medicine

## 2021-08-04 ENCOUNTER — Encounter: Payer: Self-pay | Admitting: Family Medicine

## 2021-08-04 MED ORDER — CONTINUOUS GLUCOSE MONITOR SUP KIT: PACK | 0 refills | Status: DC

## 2021-08-04 NOTE — Telephone Encounter (Signed)
Gerald Stabs from [facility] called asking if Dr. [name] would write a prescription for pt's [facility] 2 sensors and readers, just needs to know for the insurance. Please return a call back when possible.  Callback Number: [phone]

## 2021-08-04 NOTE — Addendum Note (Signed)
Addended by: Eliezer Lofts E on: 08/04/2021 08:33 AM   Modules accepted: Orders

## 2021-08-05 ENCOUNTER — Other Ambulatory Visit: Payer: Self-pay | Admitting: Family Medicine

## 2021-08-08 MED ORDER — FREESTYLE LIBRE 2 READER DEVI
1.0000 | Freq: Every day | 2 refills | Status: DC
Start: 2021-08-08 — End: 2023-09-23

## 2021-08-08 MED ORDER — FREESTYLE LIBRE 2 SENSOR MISC: 1.0000 | Freq: Every day | 3 refills | Status: DC

## 2021-08-08 NOTE — Telephone Encounter (Signed)
Gerald Stabs calling back from [employer] regarding request below, please advise.

## 2021-08-10 NOTE — Telephone Encounter (Signed)
Rx sent 

## 2021-08-15 ENCOUNTER — Encounter: Payer: Self-pay | Admitting: Family Medicine

## 2021-08-16 ENCOUNTER — Encounter: Payer: Self-pay | Admitting: Family Medicine

## 2021-08-18 ENCOUNTER — Encounter: Payer: Self-pay | Admitting: Family Medicine

## 2021-08-19 ENCOUNTER — Encounter: Payer: Self-pay | Admitting: Family Medicine

## 2021-08-21 ENCOUNTER — Encounter: Payer: Self-pay | Admitting: Family Medicine

## 2021-08-21 ENCOUNTER — Telehealth: Payer: Self-pay | Admitting: Gastroenterology

## 2021-08-21 NOTE — Telephone Encounter (Signed)
Yes, but Dr. Havery Moros would have to agree.  - HD

## 2021-08-21 NOTE — Telephone Encounter (Signed)
Good Morning Dr. Loletha Carrow,  Patients wife called stating that patient is wanting to transfer care over to Dr. Havery Moros since is wife is a patient of is. Are you okay with Dr. Havery Moros taking on care for patient?  Please advise.

## 2021-08-22 ENCOUNTER — Other Ambulatory Visit: Payer: Self-pay | Admitting: Family Medicine

## 2021-08-22 ENCOUNTER — Encounter: Payer: Self-pay | Admitting: Family Medicine

## 2021-08-22 DIAGNOSIS — R110 Nausea: Secondary | ICD-10-CM

## 2021-08-22 DIAGNOSIS — Z91018 Allergy to other foods: Secondary | ICD-10-CM

## 2021-08-22 DIAGNOSIS — E1140 Type 2 diabetes mellitus with diabetic neuropathy, unspecified: Secondary | ICD-10-CM

## 2021-08-22 NOTE — Telephone Encounter (Signed)
Dr. Havery Moros are you okay with taking on patient?

## 2021-08-22 NOTE — Telephone Encounter (Signed)
I had a chance to review this patient's chart.    He has had an extensive work-up with Dr. Loletha Carrow, and I agree with Dr. Corena Pilgrim impression of his case.  Further, the patient has had a second opinion already with [facility] in the past few months who did not recommend anything different otherwise.  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 Loletha Carrow.  In that light, I politely decline transfer of this patient to me, I do not think it is in his best interest to be switching through multiple providers especially within the same practice when he has had multiple opinions on his case recently. Thanks

## 2021-08-23 ENCOUNTER — Other Ambulatory Visit: Payer: Self-pay | Admitting: Family Medicine

## 2021-08-23 NOTE — Telephone Encounter (Signed)
Glipizide not on current medication list.  Refill states:  The original prescription was discontinued on 11/01/2020 by Jinny Sanders, MD. Renewing this prescription may not be appropriate.

## 2021-08-25 ENCOUNTER — Ambulatory Visit: Payer: Medicare Other | Admitting: Family Medicine

## 2021-08-25 ENCOUNTER — Encounter: Payer: Self-pay | Admitting: Family Medicine

## 2021-08-25 VITALS — BP 128/80 | HR 73 | Temp 98.3°F | Resp 16 | Ht 71.0 in | Wt 186.0 lb

## 2021-08-25 DIAGNOSIS — F4321 Adjustment disorder with depressed mood: Secondary | ICD-10-CM | POA: Insufficient documentation

## 2021-08-25 DIAGNOSIS — G8929 Other chronic pain: Secondary | ICD-10-CM | POA: Insufficient documentation

## 2021-08-25 DIAGNOSIS — C61 Malignant neoplasm of prostate: Secondary | ICD-10-CM | POA: Diagnosis not present

## 2021-08-25 DIAGNOSIS — E1140 Type 2 diabetes mellitus with diabetic neuropathy, unspecified: Secondary | ICD-10-CM

## 2021-08-25 DIAGNOSIS — E1165 Type 2 diabetes mellitus with hyperglycemia: Secondary | ICD-10-CM | POA: Diagnosis not present

## 2021-08-25 DIAGNOSIS — R109 Unspecified abdominal pain: Secondary | ICD-10-CM

## 2021-08-25 LAB — POCT GLYCOSYLATED HEMOGLOBIN (HGB A1C): Hemoglobin A1C: 8.2 % — AB (ref 4.0–5.6)

## 2021-08-25 NOTE — Assessment & Plan Note (Addendum)
Chronic, per patient 1.5 to 3 year life expectancy although he has not been able to tolerate recent treatment given elevated LFTs.  He continues to [employer] which does have associated abdominal issues.

## 2021-08-25 NOTE — Assessment & Plan Note (Addendum)
Chronic, poor control.  He reports very poor quality of life with this continued abdominal pain.  His work-up and treatment trail  are summarized as follows:  He has had a GI  ( Dr. Loletha Carrow) work-up with no clear cause including endoscopy, labs etc. He states that Reglan has helped a little but had negative testing at the [facility] for diabetic gastroparesis.  Worse in morning and again 4-5 PM.  Describes as an ache, not really nausea, no burping, no heartburn. He cannot read, rest and has poor quality of life given the pain.  The only activity he can do while he is having the pain is exercise. He states probiotics made his symptoms worse.   He has a referral to a nutritionist for better diabetes control with the thought that this could be contributing to his abdominal pain.   A referral to an allergist has been made to determine if he has any food allergies. He does feel that the pain started after he began Eligard for his prostate cancer.  He has been told he cannot stop this medication and the oncologist does not clearly think it is causing his pain although abdominal pain is a common side effect listed. Dicyclomine has not helped in the past.  Proton pump inhibitors and H2 blockers have not helped with the pain.  He does not use NSAIDs and Tylenol does not help with the pain. No associated diarrhea or constipation He has completed a 10-day antibiotic regimen for SIBO but it did not help He has not been accepted for second opinion referral to [facility].  Per the patient his wife sees Dr. Havery Moros and he has put in a phone call to Dr. Havery Moros for consideration of second opinion consult at that office. He asked today if I will contact Dr. Havery Moros to ask if he will consider reviewing his chart.  I have placed a referral to an allergist for consideration of food allergies causing his issues.  I wonder if just simple pain medication treatment would be an option.  He denies depression and anxiety  but does appear tearful and fatalistic at the appointment today.  Potentially we can consider an antidepressant as a future option.

## 2021-08-25 NOTE — Patient Instructions (Addendum)
Continue Lantus at current  dose. Keep appt for nutrition referral.   If still 2 hours after meals glucose > 180... we will consider adding a fast acting uinsulin prior to largest meal of the day. Consider bedtime snack to prevent lows at night.

## 2021-08-25 NOTE — Progress Notes (Signed)
Patient ID: Alexander Yang, male    DOB: 1941/03/09, 81 y.o.   MRN: [id number]  This visit was conducted in person.  BP 128/80   Pulse 73   Temp 98.3 F (36.8 C)   Resp 16   Ht 5' 11"  (1.803 m)   Wt 186 lb (84.4 kg)   SpO2 97%   BMI 25.94 kg/m    CC:  Chief Complaint  Patient presents with   Diabetes   GI Problem    Stomach pain X 1.5 year Hurts worse in the am and about 4-5 pm it is pretty much gone.    Subjective:   HPI: Alexander Yang is a 81 y.o. male presenting on 08/25/2021 for Diabetes and GI Problem (Stomach pain X 1.5 year Hurts worse in the am and about 4-5 pm it is pretty much gone.)   Has been taken off  prostate cancer med given elevated LFTs. Prior to starting they had been normal.  Still on Eliguard.. this could cause his GI issues? Symptoms  started   Diabetes:  He has been using continuous glucose monitor lately for following blood sugars. He is on Lantus 30 units daily.  A1C 8.2 He has a pending nutrition referral in place ( July 3) to help him with his low-carb diet. Using medications without difficulties: Hypoglycemic episodes: none Hyperglycemic episodes:  2 times  in night Feet problems: Blood Sugars averaging: CBG monitor:  150-170,  eye exam within last year: yes   Chronic epigastric abdominal pain: He continues to struggle with GI upset/ache x 1.5 years.  He has had a GI  ( Dr. Loletha Carrow) work-up with no clear cause including endoscopy, labs etc. He states that Reglan has helped a little but had negative testing at the [facility] for diabetic gastroparesis.  Worse in morning and again 4-5 PM.  Describes as an ache, not really nausea, no burping, no heartburn. He cannot read, rest and has poor quality of life given the pain.  The only activity he can do while he is having the pain is exercise. He states probiotics made his symptoms worse.   He has a referral to a nutritionist for better diabetes control with the thought that this could be contributing  to his abdominal pain.   A referral to an allergist has been made to determine if he has any food allergies. He does feel that the pain started after he began Eligard for his prostate cancer.  He has been told he cannot stop this medication and the oncologist does not clearly think it is causing his pain although abdominal pain is a common side effect listed. Dicyclomine has not helped in the past.  Proton pump inhibitors and H2 blockers have not helped with the pain. He has completed a 10-day antibiotic regimen for SIBO but it did not help Today in the office he is tearful for the first time he states that " if the Eligard is causing his symptoms he would rather stop this medication and live 6 months without the abdominal pain then live 3-4 more years having to suffer with this intolerable stomach pain." He has denied depression and anxiety in the past.  He denies suicidal ideation.  He does not feel that he is depressed or anxious.  We have tried referrals to [facility] and [facility] for second opinions but he has been denied acceptance.  Reviewed recent Mychart notes for updates.   Relevant past medical, surgical, family and social history reviewed and updated  as indicated. Interim medical history since our last visit reviewed. Allergies and medications reviewed and updated. Outpatient Medications Prior to Visit  Medication Sig Dispense Refill   ACCU-CHEK AVIVA PLUS test strip USE TO CHECK BLOOD SUGAR 2 TIMES A DAY. DX: E11.49 100 strip 6   amLODipine (NORVASC) 5 MG tablet Take 1 tablet (5 mg total) by mouth daily. 90 tablet 3   aspirin 81 MG tablet Take 81 mg by mouth every other day.     atorvastatin (LIPITOR) 40 MG tablet TAKE 1 TABLET BY MOUTH ONCE DAILY 90 tablet 3   Blood Glucose Monitoring Suppl (ACCU-CHEK AVIVA PLUS) w/Device KIT Use to check blood sugar 2 times a day.  Dx: E11.49 1 kit 0   CALCIUM-VITAMIN D PO Take 1 tablet by mouth daily.     CINNAMON PO Take 1 capsule by mouth daily.      Continuous Blood Gluc Receiver (FREESTYLE LIBRE 2 READER) DEVI 1 each by Does not apply route daily. 1 each 2   Continuous Blood Gluc Sensor (FREESTYLE LIBRE 2 SENSOR) MISC 1 each by Does not apply route daily. 2 each 3   Continuous Glucose Monitor Sup KIT Check blood sugar continuously.  E11.49 1 kit 0   Insulin Pen Needle (PEN NEEDLES) 32G X 4 MM MISC Use to inject insulin daily 100 each 3   ketoconazole (NIZORAL) 2 % cream Apply 1 application topically daily as needed for irritation.     Lancets (ACCU-CHEK SOFT TOUCH) lancets Use to check blood sugar 2 times a day.  Dx: E11.49 100 each 11   Lancets Misc. (ACCU-CHEK SOFTCLIX LANCET DEV) KIT Use to check blood sugar 2 times a day.  Dx: E11.49 1 kit 0   LANTUS SOLOSTAR 100 UNIT/ML Solostar Pen INJECT 30 UNITS INTO THE SKIN DAILY 15 mL 3   losartan (COZAAR) 100 MG tablet TAKE 1 TABLET BY MOUTH ONCE DAILY 90 tablet 3   metoCLOPramide (REGLAN) 10 MG tablet Take 1 tablet (10 mg total) by mouth 3 (three) times daily before meals. 30 tablet 3   Leuprolide Acetate, 3 Month, (ELIGARD) 22.5 MG injection Inject 22.5 mg into the skin every 30 (thirty) days.      No facility-administered medications prior to visit.     Per HPI unless specifically indicated in ROS section below Review of Systems  Constitutional:  Negative for fatigue and fever.  HENT:  Negative for ear pain.   Eyes:  Negative for pain.  Respiratory:  Negative for cough and shortness of breath.   Cardiovascular:  Negative for chest pain, palpitations and leg swelling.  Gastrointestinal:  Negative for abdominal distention, abdominal pain, anal bleeding, blood in stool, constipation, diarrhea, nausea, rectal pain and vomiting.  Genitourinary:  Negative for dysuria.  Musculoskeletal:  Negative for arthralgias.  Neurological:  Negative for syncope, light-headedness and headaches.  Psychiatric/Behavioral:  Negative for dysphoric mood.    Objective:  BP 128/80   Pulse 73   Temp 98.3 F  (36.8 C)   Resp 16   Ht 5' 11"  (1.803 m)   Wt 186 lb (84.4 kg)   SpO2 97%   BMI 25.94 kg/m   Wt Readings from Last 3 Encounters:  08/25/21 186 lb (84.4 kg)  05/23/21 187 lb 9.6 oz (85.1 kg)  03/31/21 187 lb (84.8 kg)      Physical Exam Constitutional:      Appearance: He is well-developed.  HENT:     Head: Normocephalic.     Right Ear: Hearing  normal.     Left Ear: Hearing normal.     Nose: Nose normal.  Neck:     Thyroid: No thyroid mass or thyromegaly.     Vascular: No carotid bruit.     Trachea: Trachea normal.  Cardiovascular:     Rate and Rhythm: Normal rate and regular rhythm.     Pulses: Normal pulses.     Heart sounds: Heart sounds not distant. No murmur heard.    No friction rub. No gallop.     Comments: No peripheral edema Pulmonary:     Effort: Pulmonary effort is normal. No respiratory distress.     Breath sounds: Normal breath sounds.  Abdominal:     Tenderness: There is abdominal tenderness in the epigastric area.  Skin:    General: Skin is warm and dry.     Findings: No rash.  Psychiatric:        Speech: Speech normal.        Behavior: Behavior normal.        Thought Content: Thought content normal.       Results for orders placed or performed in visit on 08/01/21  Hepatic Function Panel  Result Value Ref Range   Total Bilirubin 0.8 0.2 - 1.2 mg/dL   Bilirubin, Direct 0.1 0.0 - 0.3 mg/dL   Alkaline Phosphatase 47 39 - 117 U/L   AST 17 0 - 37 U/L   ALT 18 0 - 53 U/L   Total Protein 6.8 6.0 - 8.3 g/dL   Albumin 4.0 3.5 - 5.2 g/dL     COVID 19 screen:  No recent travel or known exposure to COVID19 The patient denies respiratory symptoms of COVID 19 at this time. The importance of social distancing was discussed today.   Assessment and Plan    Problem List Items Addressed This Visit     Diabetes mellitus with neurological manifestations, controlled ([place]) (Chronic)    Chronic, inadequate control although tolerable for his age.  Given we  have concerned that poor diabetes control could be worsening his abdominal symptoms, we will attempt to get tighter control.  He has an appointment in 2 weeks with a nutritionist.  We reviewed dietary changes in detail today.  He will continue the Lantus 30 units daily.  If control is not improving following the nutritionist office visit we will initiate fast acting insulin with his monitors meal of the day.  Of note he had side effects to metformin and GLP-1 medications in the past.      Prostate cancer (HCC) (Chronic)    Chronic, per patient 1.5 to 3 year life expectancy although he has not been able to tolerate recent treatment given elevated LFTs.  He continues to [employer] which does have associated abdominal issues.       Chronic abdominal pain    Chronic, poor control.  He reports very poor quality of life with this continued abdominal pain.  His work-up and treatment trail  are summarized as follows:  He has had a GI  ( Dr. Loletha Carrow) work-up with no clear cause including endoscopy, labs etc. He states that Reglan has helped a little but had negative testing at the [facility] for diabetic gastroparesis.  Worse in morning and again 4-5 PM.  Describes as an ache, not really nausea, no burping, no heartburn. He cannot read, rest and has poor quality of life given the pain.  The only activity he can do while he is having the pain is exercise. He states  probiotics made his symptoms worse.   He has a referral to a nutritionist for better diabetes control with the thought that this could be contributing to his abdominal pain.   A referral to an allergist has been made to determine if he has any food allergies. He does feel that the pain started after he began Eligard for his prostate cancer.  He has been told he cannot stop this medication and the oncologist does not clearly think it is causing his pain although abdominal pain is a common side effect listed. Dicyclomine has not helped in the past.  Proton  pump inhibitors and H2 blockers have not helped with the pain.  He does not use NSAIDs and Tylenol does not help with the pain. No associated diarrhea or constipation He has completed a 10-day antibiotic regimen for SIBO but it did not help He has not been accepted for second opinion referral to [facility].  Per the patient his wife sees Dr. Havery Moros and he has put in a phone call to Dr. Havery Moros for consideration of second opinion consult at that office. He asked today if I will contact Dr. Havery Moros to ask if he will consider reviewing his chart.  I have placed a referral to an allergist for consideration of food allergies causing his issues.  I wonder if just simple pain medication treatment would be an option.  He denies depression and anxiety but does appear tearful and fatalistic at the appointment today.  Potentially we can consider an antidepressant as a future option.        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

## 2021-08-25 NOTE — Assessment & Plan Note (Signed)
Chronic, inadequate control although tolerable for his age.  Given we have concerned that poor diabetes control could be worsening his abdominal symptoms, we will attempt to get tighter control.  He has an appointment in 2 weeks with a nutritionist.  We reviewed dietary changes in detail today.  He will continue the Lantus 30 units daily.  If control is not improving following the nutritionist office visit we will initiate fast acting insulin with his monitors meal of the day.  Of note he had side effects to metformin and GLP-1 medications in the past.

## 2021-08-28 MED ORDER — GLIPIZIDE ER 10 MG PO TB24: 10.0000 mg | ORAL_TABLET | Freq: Every day | ORAL | 3 refills | Status: DC

## 2021-09-02 ENCOUNTER — Encounter: Payer: Self-pay | Admitting: Gastroenterology

## 2021-09-05 ENCOUNTER — Encounter: Payer: Self-pay | Admitting: Family Medicine

## 2021-09-10 ENCOUNTER — Encounter: Payer: Self-pay | Admitting: Family Medicine

## 2021-09-10 ENCOUNTER — Encounter: Payer: Self-pay | Admitting: Gastroenterology

## 2021-09-11 ENCOUNTER — Encounter: Payer: Self-pay | Admitting: *Deleted

## 2021-09-11 ENCOUNTER — Encounter: Payer: Self-pay | Admitting: Family Medicine

## 2021-09-11 ENCOUNTER — Encounter: Payer: Medicare Other | Attending: Family Medicine | Admitting: *Deleted

## 2021-09-11 ENCOUNTER — Other Ambulatory Visit: Payer: Self-pay | Admitting: Family Medicine

## 2021-09-11 VITALS — BP 124/74 | Ht 71.0 in | Wt 180.8 lb

## 2021-09-11 DIAGNOSIS — E1165 Type 2 diabetes mellitus with hyperglycemia: Secondary | ICD-10-CM | POA: Diagnosis not present

## 2021-09-11 DIAGNOSIS — E1140 Type 2 diabetes mellitus with diabetic neuropathy, unspecified: Secondary | ICD-10-CM | POA: Insufficient documentation

## 2021-09-11 DIAGNOSIS — Z713 Dietary counseling and surveillance: Secondary | ICD-10-CM | POA: Diagnosis not present

## 2021-09-11 MED ORDER — DULOXETINE HCL 30 MG PO CPEP: ORAL_CAPSULE | ORAL | 3 refills | Status: DC

## 2021-09-11 NOTE — Progress Notes (Signed)
Diabetes Self-Management Education  Visit Type: First/Initial  Appt. Start Time: 1020 Appt. End Time: 1140  09/11/2021  [name], identified by name and date of birth, is a 81 y.o. male with a diagnosis of Diabetes: Type 2.   ASSESSMENT  Blood pressure 124/74, height '5\' 11"'$  (1.803 m), weight 180 lb 12.8 oz (82 kg). Body mass index is 25.22 kg/m.   Diabetes Self-Management Education - 09/11/21 1159       Visit Information   Visit Type First/Initial      Initial Visit   Diabetes Type Type 2    Date Diagnosed 2012    Are you currently following a meal plan? No    Are you taking your medications as prescribed? Yes      Health Coping   How would you rate your overall health? Fair      Psychosocial Assessment   Patient Belief/Attitude about Diabetes Other (comment)   "I don't know if Diabetes is affecting my health at all"   What is the hardest part about your diabetes right now, causing you the most concern, or is the most worrisome to you about your diabetes?   Making healty food and beverage choices    Self-care barriers None    Self-management support Doctor's office;Family    Patient Concerns Nutrition/Meal planning;Medication;Monitoring;Weight Control    Special Needs None    Preferred Learning Style Auditory;Visual;Other (comment)   talking/discussion   Learning Readiness Ready    How often do you need to have someone help you when you read instructions, pamphlets, or other written materials from your doctor or pharmacy? 1 - Never    What is the last grade level you completed in school? some college      Pre-Education Assessment   Patient understands the diabetes disease and treatment process. Needs Review    Patient understands incorporating nutritional management into lifestyle. Needs Review    Patient undertands incorporating physical activity into lifestyle. Demonstrates understanding / competency    Patient understands using medications safely. Needs  Review    Patient understands monitoring blood glucose, interpreting and using results Needs Review    Patient understands prevention, detection, and treatment of acute complications. Needs Review    Patient understands prevention, detection, and treatment of chronic complications. Needs Review    Patient understands how to develop strategies to address psychosocial issues. Needs Instruction    Patient understands how to develop strategies to promote health/change behavior. Needs Instruction      Complications   Last HgB A1C per patient/outside source 8.2 %   08/25/2021   How often do you check your blood sugar? > 4 times/day   FreeStyle Libre 2 - Avg BG 170 mg/dL for 30 and 90 days; Above target 41%, In target 57%, Below target 2%   Number of hypoglycemic episodes per month 3    Can you tell when your blood sugar is low? Yes    What do you do if your blood sugar is low? drank orange juice    Number of hyperglycemic episodes ( >'200mg'$ /dL): Daily    Can you tell when your blood sugar is high? No    Have you had a dilated eye exam in the past 12 months? Yes    Have you had a dental exam in the past 12 months? Yes    Are you checking your feet? No      Dietary Intake   Breakfast cereal and milk; egg and 1 piece of toast  Lunch sometimes skips if stomach pain - meat sandwich on bread or bagel    Snack (afternoon) 1/2 bagel with peanut butter or crackers and peanut butter    Dinner chicken, pork, occasional beef, fish; potatoes, pasta, peas, corn, chili with beans, green beans, broccoli cauliflower, salads with lettuce, tomatoes, dried fruit, croutons, mushrooms    Snack (evening) crackers with peanut butter    Beverage(s) water, coffee      Activity / Exercise   Activity / Exercise Type Light (walking / raking leaves)    How many days per week do you exercise? 5    How many minutes per day do you exercise? 52.5    Total minutes per week of exercise 262.5      Patient Education    Previous Diabetes Education Yes (please comment)   Pt came to 1 visit in 2019 for education   Disease Pathophysiology Factors that contribute to the development of diabetes;Explored patient's options for treatment of their diabetes    Healthy Eating Role of diet in the treatment of diabetes and the relationship between the three main macronutrients and blood glucose level;Food label reading, portion sizes and measuring food.;Reviewed blood glucose goals for pre and post meals and how to evaluate the patients' food intake on their blood glucose level.;Meal timing in regards to the patients' current diabetes medication.    Being Active Role of exercise on diabetes management, blood pressure control and cardiac health.    Medications Taught/reviewed insulin/injectables, injection, site rotation, insulin/injectables storage and needle disposal.;Reviewed patients medication for diabetes, action, purpose, timing of dose and side effects.    Monitoring Taught/evaluated CGM (comment);Purpose and frequency of SMBG.;Taught/discussed recording of test results and interpretation of SMBG.;Identified appropriate SMBG and/or A1C goals.   FreeStyle Libre 2   Acute complications Taught prevention, symptoms, and  treatment of hypoglycemia - the 15 rule.    Chronic complications Relationship between chronic complications and blood glucose control    Diabetes Stress and Support Identified and addressed patients feelings and concerns about diabetes;Role of stress on diabetes;Other (comment)   Role of pain on blood sugars     Individualized Goals (developed by patient)   Reducing Risk Other (comment)   decrease medications, prevent diabetes complications, lose weight (10 lbs)     Outcomes   Expected Outcomes Demonstrated interest in learning. Expect positive outcomes    Future DMSE 4-6 wks         Individualized Plan for Diabetes Self-Management Training:   Learning Objective:  Patient will have a greater  understanding of diabetes self-management. Patient education plan is to attend individual and/or group sessions per assessed needs and concerns.   Plan:   Patient Instructions  Check blood sugars before each meal, 2 hrs after meals, bedtime every day and as needed with FreeStyle Libre  Bring blood sugar records/FreeStyle reader to the next appointment  Exercise:  Continue walking for    45-60  minutes   5  days a week  Eat 3 meals day,   1-2  snacks a day Space meals 4-6 hours apart Allow 2-3 hours between meals and snacks Include 1 serving of protein with breakfast Increase water to 4 servings per day Don't skip meals - eat at least 1 protein and 1 carbohydrate serving  Complete 3 Day Food Record and bring to next appt  Carry fast acting glucose and a snack at all times Rotate injection sites Hold insulin pen in place for 5-10 seconds after injection  Expected Outcomes:  Demonstrated interest in learning. Expect positive outcomes  Education material provided:  General Meal Planning Guidelines Simple Meal Plan 3 Day Food Record Glucose tablets Symptoms, causes and treatments of Hypoglycemia FreeStyle Libre AVS Pen Injection Sites (BD)   If problems or questions, patient to contact team via:   Johny Drilling, RN, [place], [place] [phone]  Future DSME appointment: 4-6 wks October 10, 2021 with this educator

## 2021-09-11 NOTE — Patient Instructions (Signed)
Check blood sugars before each meal, 2 hrs after meals, bedtime every day and as needed with FreeStyle Libre  Bring blood sugar records/FreeStyle reader to the next appointment  Exercise:  Continue walking for    45-60  minutes   5  days a week  Eat 3 meals day,   1-2  snacks a day Space meals 4-6 hours apart Allow 2-3 hours between meals and snacks Include 1 serving of protein with breakfast Increase water to 4 servings per day Don't skip meals - eat at least 1 protein and 1 carbohydrate serving  Complete 3 Day Food Record and bring to next appt  Carry fast acting glucose and a snack at all times Rotate injection sites Hold insulin pen in place for 5-10 seconds after injection  Return for appointment on:

## 2021-09-11 NOTE — Telephone Encounter (Signed)
Last office visit 08/25/2021 DM and GI problems.  Last refilled 07/04/2021 for #30 with 3 refills.  Next Appt: 11/23/21.

## 2021-09-13 ENCOUNTER — Encounter: Payer: Self-pay | Admitting: Family Medicine

## 2021-09-13 MED ORDER — METOCLOPRAMIDE HCL 10 MG PO TABS: 10.0000 mg | ORAL_TABLET | Freq: Three times a day (TID) | ORAL | 0 refills | Status: DC

## 2021-09-18 ENCOUNTER — Encounter: Payer: Self-pay | Admitting: Family Medicine

## 2021-09-21 ENCOUNTER — Encounter: Payer: Self-pay | Admitting: Family Medicine

## 2021-09-29 ENCOUNTER — Encounter: Payer: Self-pay | Admitting: Family Medicine

## 2021-09-29 DIAGNOSIS — H02403 Unspecified ptosis of bilateral eyelids: Secondary | ICD-10-CM

## 2021-09-29 DIAGNOSIS — H259 Unspecified age-related cataract: Secondary | ICD-10-CM

## 2021-10-02 ENCOUNTER — Encounter: Payer: Self-pay | Admitting: Family Medicine

## 2021-10-09 ENCOUNTER — Encounter: Payer: Self-pay | Admitting: Family Medicine

## 2021-10-10 ENCOUNTER — Ambulatory Visit: Payer: Medicare Other | Admitting: *Deleted

## 2021-10-13 ENCOUNTER — Ambulatory Visit: Payer: Medicare Other | Admitting: *Deleted

## 2021-10-15 ENCOUNTER — Encounter: Payer: Self-pay | Admitting: Family Medicine

## 2021-10-19 ENCOUNTER — Encounter: Payer: Self-pay | Admitting: Family Medicine

## 2021-10-20 ENCOUNTER — Other Ambulatory Visit: Payer: Self-pay

## 2021-10-20 ENCOUNTER — Emergency Department
Admission: EM | Admit: 2021-10-20 | Discharge: 2021-10-21 | Disposition: A | Discharge status: Home / Self Care | Payer: Medicare Other | Attending: Student in an Organized Health Care Education/Training Program | Admitting: Student in an Organized Health Care Education/Training Program

## 2021-10-20 ENCOUNTER — Telehealth: Payer: Self-pay

## 2021-10-20 ENCOUNTER — Encounter: Payer: Self-pay | Admitting: *Deleted

## 2021-10-20 ENCOUNTER — Ambulatory Visit: Payer: Self-pay

## 2021-10-20 ENCOUNTER — Emergency Department: Payer: Medicare Other

## 2021-10-20 ENCOUNTER — Encounter: Payer: Medicare Other | Attending: Family Medicine | Admitting: *Deleted

## 2021-10-20 VITALS — BP 120/68 | Wt 180.4 lb

## 2021-10-20 DIAGNOSIS — E1165 Type 2 diabetes mellitus with hyperglycemia: Secondary | ICD-10-CM | POA: Insufficient documentation

## 2021-10-20 DIAGNOSIS — R42 Dizziness and giddiness: Secondary | ICD-10-CM | POA: Diagnosis present

## 2021-10-20 DIAGNOSIS — R109 Unspecified abdominal pain: Secondary | ICD-10-CM | POA: Insufficient documentation

## 2021-10-20 DIAGNOSIS — Z794 Long term (current) use of insulin: Secondary | ICD-10-CM | POA: Diagnosis present

## 2021-10-20 DIAGNOSIS — R110 Nausea: Secondary | ICD-10-CM | POA: Diagnosis not present

## 2021-10-20 LAB — COMPREHENSIVE METABOLIC PANEL
ALT: 22 U/L (ref 0–44)
AST: 19 U/L (ref 15–41)
Albumin: 4 g/dL (ref 3.5–5.0)
Alkaline Phosphatase: 36 U/L — ABNORMAL LOW (ref 38–126)
Anion gap: 5 (ref 5–15)
BUN: 21 mg/dL (ref 8–23)
CO2: 26 mmol/L (ref 22–32)
Calcium: 9 mg/dL (ref 8.9–10.3)
Chloride: 110 mmol/L (ref 98–111)
Creatinine, Ser: 0.81 mg/dL (ref 0.61–1.24)
GFR, Estimated: >60 mL/min (ref >60)
Glucose, Bld: 123 mg/dL — ABNORMAL HIGH (ref 70–99)
Potassium: 4 mmol/L (ref 3.5–5.1)
Sodium: 141 mmol/L (ref 135–145)
Total Bilirubin: 1.2 mg/dL (ref 0.3–1.2)
Total Protein: 7 g/dL (ref 6.5–8.1)

## 2021-10-20 LAB — TROPONIN I (HIGH SENSITIVITY): Troponin I (High Sensitivity): 7 ng/L (ref <18)

## 2021-10-20 LAB — CBC
HCT: 39.6 % (ref 39.0–52.0)
Hemoglobin: 13.2 g/dL (ref 13.0–17.0)
MCH: 30.8 pg (ref 26.0–34.0)
MCHC: 33.3 g/dL (ref 30.0–36.0)
MCV: 92.5 fL (ref 80.0–100.0)
Platelets: 175 10*3/uL (ref 150–400)
RBC: 4.28 MIL/uL (ref 4.22–5.81)
RDW: 12.2 % (ref 11.5–15.5)
WBC: 4.9 10*3/uL (ref 4.0–10.5)
nRBC: 0 % (ref 0.0–0.2)

## 2021-10-20 LAB — LIPASE, BLOOD: Lipase: 32 U/L (ref 11–51)

## 2021-10-20 MED ORDER — IOHEXOL 300 MG/ML  SOLN
100.0000 mL | Freq: Once | INTRAMUSCULAR | Status: AC | PRN
Start: 2021-10-20 — End: 2021-10-20
  Administered 2021-10-20: 100 mL via INTRAVENOUS

## 2021-10-20 NOTE — Telephone Encounter (Signed)
I spoke with pt's wife (DPR signed) no dizziness today but pt is concerned about driving until gets cked out. Advised pt should not drive until seen by provider. BP yesterday after short episode of dizziness was 138/75 P 85. This morning BP 126/77 P 79; 10/20/21 FBS 114. Pt has already taken his morning meds. No CP,SOB noted. No available appts at [facility] or [facility] today. Pt prefers not to drive to [place]. I scheduled pt appt at [facility] 10/20/21 at 2:45 with UC & ED precautions given and pts wife voiced understanding.sending note to Dr [name] and Butch Penny CMA.

## 2021-10-20 NOTE — Progress Notes (Signed)
Diabetes Self-Management Education  Visit Type: Follow-up  Appt. Start Time: 0998 Appt. End Time: 3382  10/20/2021  [name], identified by name and date of birth, is a 81 y.o. male with a diagnosis of Diabetes: Type 2.   ASSESSMENT  Blood pressure 120/68, weight 180 lb 6.4 oz (81.8 kg). Body mass index is 25.16 kg/m.   Diabetes Self-Management Education - 10/20/21 1530       Visit Information   Visit Type Follow-up      Initial Visit   Diabetes Type Type 2      Complications   How often do you check your blood sugar? > 4 times/day   FreeStyle Libre - 30 day avg 167 mg/dL Above target 37%, In target 63%, Below target 0%; 90 day avg 161 mg/dL Above target 34%, In target 65%, Below target 1%   Number of hypoglycemic episodes per month 1   from 6 pm to 12 mn   Can you tell when your blood sugar is low? Yes    What do you do if your blood sugar is low? drinks orange juice    Number of hyperglycemic episodes ( >'200mg'$ /dL): Daily    Can you tell when your blood sugar is high? No    Have you had a dilated eye exam in the past 12 months? Yes    Have you had a dental exam in the past 12 months? Yes    Are you checking your feet? Yes    How many days per week are you checking your feet? 1      Dietary Intake   Breakfast see 3 Day Food Record      Activity / Exercise   Activity / Exercise Type ADL's   hasn't been walking due to the heat     Patient Education   Disease Pathophysiology Factors that contribute to the development of diabetes;Explored patient's options for treatment of their diabetes    Healthy Eating Role of diet in the treatment of diabetes and the relationship between the three main macronutrients and blood glucose level;Food label reading, portion sizes and measuring food.;Reviewed blood glucose goals for pre and post meals and how to evaluate the patients' food intake on their blood glucose level.;Meal timing in regards to the patients' current diabetes  medication.;Carbohydrate counting    Being Active Role of exercise on diabetes management, blood pressure control and cardiac health.    Medications Taught/reviewed insulin/injectables, injection, site rotation, insulin/injectables storage and needle disposal.;Reviewed patients medication for diabetes, action, purpose, timing of dose and side effects.;Other (comment)   Discussed use of rapid acting insulin for breakfast and lunch due to high post meal readings   Monitoring Taught/evaluated CGM (comment);Purpose and frequency of SMBG.;Taught/discussed recording of test results and interpretation of SMBG.;Identified appropriate SMBG and/or A1C goals.    Acute complications Taught prevention, symptoms, and  treatment of hypoglycemia - the 15 rule.    Chronic complications Relationship between chronic complications and blood glucose control    Diabetes Stress and Support Identified and addressed patients feelings and concerns about diabetes;Other (comment)   Pt is seeing therapist at [facility].     Individualized Goals (developed by patient)   Nutrition Follow meal plan discussed;General guidelines for healthy choices and portions discussed    Physical Activity Exercise 5-7 days per week;30 minutes per day    Medications take my medication as prescribed    Monitoring  Test my blood glucose as discussed;Consistenly use CGM  Post-Education Assessment   Patient understands the diabetes disease and treatment process. Comprehends key points    Patient understands incorporating nutritional management into lifestyle. Comprehends key points    Patient undertands incorporating physical activity into lifestyle. Demonstrates understanding / competency    Patient understands using medications safely. Demonstrates understanding / competency    Patient understands monitoring blood glucose, interpreting and using results Demonstrates understanding / competency    Patient understands prevention, detection, and  treatment of acute complications. Comprehends key points    Patient understands prevention, detection, and treatment of chronic complications. Demonstrates understanding / competency    Patient understands how to develop strategies to address psychosocial issues. Comprehends key points    Patient understands how to develop strategies to promote health/change behavior. Comprehends key points      Outcomes   Expected Outcomes Demonstrated interest in learning. Expect positive outcomes    Program Status Completed      Subsequent Visit   Since your last visit have you continued or begun to take your medications as prescribed? Yes    Since your last visit have you had your blood pressure checked? No    Since your last visit have you experienced any weight changes? No change    Since your last visit, are you checking your blood glucose at least once a day? Yes         Individualized Plan for Diabetes Self-Management Training:   Learning Objective:  Patient will have a greater understanding of diabetes self-management. Patient education plan is to attend individual and/or group sessions per assessed needs and concerns.   Plan:   Patient Instructions  Check blood sugars before meals, 2 hrs after meals, bedtime and as needed with FreeStyle Libre  Exercise: Begin walking again  for    45-60  minutes   5  days a week when cleared by MD  Eat 3 meals day,   1-2  snacks a day Space meals 4-6 hours apart Allow 2-3 hours between meals and snacks Include 1 serving of protein when eating fruit for a snack Continue to drink at least 4 servings of water every day  Carry fast acting glucose and a snack at all times Rotate injection sites   Expected Outcomes:  Demonstrated interest in learning. Expect positive outcomes  Education material provided:  Planning a Balanced Meal Quick and Balanced Meals Making Choices Using Food Labels (ADA)  If problems or questions, patient to contact team via:    Johny Drilling, RN, CCM, [place] [phone]  Future DSME appointment:  PRN

## 2021-10-20 NOTE — ED Provider Triage Note (Signed)
Emergency Medicine Provider Triage Evaluation Note  Jazziel Fitzsimmons , a 81 y.o. male  was evaluated in triage.  Pt complains of syncopal episode yesterday and 1 year of continuous, diffuse abdominal pain that GI has not been able to diagnose. Syncopal episode while sitting then room started spinning and he passed out. Event was witnessed by friends. He was out about 20 seconds.   Physical Exam  There were no vitals taken for this visit. Gen:   Awake, no distress   Resp:  Normal effort  MSK:   Moves extremities without difficulty  Other:    Medical Decision Making  Medically screening exam initiated at 3:17 PM.  Appropriate orders placed.  [name] was informed that the remainder of the evaluation will be completed by another provider, this initial triage assessment does not replace that evaluation, and the importance of remaining in the ED until their evaluation is complete.   Victorino Dike, FNP 10/20/21 1520

## 2021-10-20 NOTE — ED Triage Notes (Signed)
Pt in with co syncopal episode yesterday, states lasted around 20 sec. Pt has no injury from fall states he felt dizzy, no pain or other complaints. Pt denies any n.v.d states has had some abd pain.

## 2021-10-20 NOTE — Telephone Encounter (Signed)
Called patient regarding appt scheduled for [facility]. Pt not available, instructed wife to give pt message to return call. Voiced understanding.

## 2021-10-20 NOTE — Telephone Encounter (Signed)
Agree patient needs to be seen ASAP.

## 2021-10-20 NOTE — Patient Instructions (Signed)
Check blood sugars before meals, 2 hrs after meals, bedtime and as needed with FreeStyle Libre  Exercise: Begin walking again  for    45-60  minutes   5  days a week when cleared by MD  Eat 3 meals day,   1-2  snacks a day Space meals 4-6 hours apart Allow 2-3 hours between meals and snacks Include 1 serving of protein when eating fruit for a snack Continue to drink at least 4 servings of water every day  Carry fast acting glucose and a snack at all times Rotate injection sites

## 2021-10-20 NOTE — ED Provider Notes (Signed)
[facility] Provider Note    Event Date/Time   First MD Initiated Contact with Patient 10/20/21 2137     (approximate)   History   Loss of Consciousness   HPI  Alexander Yang is a 81 y.o. male who presents to the ER for evaluation episode of dizziness that occurred yesterday while he was eating lunch.  States he did feel little bit of nausea while he was eating and then started feeling the room was spinning around lasted roughly 20 minutes.  This occurred while he was sitting.  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  Also had some abdominal pain associated with this.  No fevers or chills.  Has not had any symptoms today.  No numbness or tingling.     Physical Exam   Triage Vital Signs: ED Triage Vitals  Enc Vitals Group     BP 10/20/21 1517 (!) 153/79     Pulse Rate 10/20/21 1517 77     Resp 10/20/21 1517 18     Temp 10/20/21 1517 98.5 F (36.9 C)     Temp Source 10/20/21 1517 Oral     SpO2 10/20/21 1517 99 %     Weight 10/20/21 1518 180 lb (81.6 kg)     Height 10/20/21 1518 6' (1.829 m)     Head Circumference --      Peak Flow --      Pain Score 10/20/21 1518 3     Pain Loc --      Pain Edu? --      Excl. in [place]? --     Most recent vital signs: Vitals:   10/20/21 2130 10/20/21 2200  BP: (!) 153/66 128/67  Pulse: 62 (!) 53  Resp: 18 18  Temp:    SpO2: 99% 97%     Constitutional: Alert  Eyes: Conjunctivae are normal.  Head: Atraumatic. Nose: No congestion/rhinnorhea. Mouth/Throat: Mucous membranes are moist.   Neck: Painless ROM.  Cardiovascular:   Good peripheral circulation. Respiratory: Normal respiratory effort.  No retractions.  Gastrointestinal: Soft and nontender.  Musculoskeletal:  no deformity Neurologic:  MAE spontaneously. No gross focal neurologic deficits are appreciated.  Skin:  Skin is warm, dry and intact. No rash noted. Psychiatric: Mood and affect are normal. Speech and behavior are normal.    ED Results /  Procedures / Treatments   Labs (all labs ordered are listed, but only abnormal results are displayed) Labs Reviewed  COMPREHENSIVE METABOLIC PANEL - Abnormal; Notable for the following components:      Result Value   Glucose, Bld 123 (*)    Alkaline Phosphatase 36 (*)    All other components within normal limits  CBC  LIPASE, BLOOD  TROPONIN I (HIGH SENSITIVITY)     EKG  ED ECG REPORT I, Merlyn Lot, the attending physician, personally viewed and interpreted this ECG.   Date: 10/20/2021  EKG Time: 15:26  Rate: 60  Rhythm: sinus  Axis: normal  Intervals:normal qt  ST&T Change: no stemi, nonspecific st abn    RADIOLOGY Please see ED Course for my review and interpretation.  I personally reviewed all radiographic images ordered to evaluate for the above acute complaints and reviewed radiology reports and findings.  These findings were personally discussed with the patient.  Please see medical record for radiology report.    PROCEDURES:  Critical Care performed: No  Procedures   MEDICATIONS ORDERED IN ED: Medications  iohexol (OMNIPAQUE) 300 MG/ML solution 100 mL (100 mLs Intravenous  Contrast Given 10/20/21 2222)     IMPRESSION / MDM / ASSESSMENT AND PLAN / ED COURSE  I reviewed the triage vital signs and the nursing notes.                              Differential diagnosis includes, but is not limited to, dehydration, electrolyte abnormality, CVA, ACS, AAA, anemia, dysrhythmia, vertigo  She presented to the ER for evaluation of symptoms as described above.  This presenting complaint could reflect a potentially life-threatening illness therefore the patient will be placed on continuous pulse oximetry and telemetry for monitoring.  Laboratory evaluation will be sent to evaluate for the above complaints.  Is clinically well-appearing in no acute distress no focal deficits at this time but does have risk factors for CVA as well as cardiac etiology.  His EKG is  nonischemic and his troponin is negative now 24 hours after episode does not seem consistent with ACS.  Will order CT imaging for the above differential.   Clinical Course as of 10/20/21 2306  Fri Oct 20, 2021  2234 CT imaging on my review and interpretation does not show any evidence of perforation or obstruction.  No AAA. [PR]    Clinical Course User Index [PR] Merlyn Lot, MD   Patient reassessed.  He feels well.  Given his age and risk factors of CT imaging showing chronic changes I do think that MRI is clinically indicated rule out CVA.  If MRI negative I do believe patient will be appropriate for outpatient follow-up.  Patient agreeable plan.  Patient be signed out to oncoming physician pending follow-up MRI.   FINAL CLINICAL IMPRESSION(S) / ED DIAGNOSES   Final diagnoses:  Dizziness     Rx / DC Orders   ED Discharge Orders     None        Note:  This document was prepared using Dragon voice recognition software and may include unintentional dictation errors.    Merlyn Lot, MD 10/20/21 2306

## 2021-10-21 NOTE — Discharge Instructions (Signed)
Your workup in the Emergency Department today was reassuring.  We did not find any specific abnormalities.  We recommend you drink plenty of fluids, take your regular medications and/or any new ones prescribed today, and follow up with the doctor(s) listed in these documents as recommended.  Return to the Emergency Department if you develop new or worsening symptoms that concern you.  

## 2021-10-21 NOTE — ED Notes (Signed)
Pt given snack at this time  

## 2021-10-21 NOTE — ED Provider Notes (Signed)
-----------------------------------------   12:04 AM on 10/21/2021 -----------------------------------------  Assuming care from Dr. Quentin Cornwall.  In short, Alexander Yang is a 81 y.o. male with a chief complaint of stroke-like symptoms.  Refer to the original H&P for additional details.  The current plan of care is to follow up MRI.   Clinical Course as of 10/21/21 0735  Fri Oct 20, 2021  2234 CT imaging on my review and interpretation does not show any evidence of perforation or obstruction.  No AAA. [PR]  Sat Oct 21, 2021  0149 MR BRAIN WO CONTRAST I viewed and interpreted the patient's MR brain.  I see no area that would suggest an acute CVA.  The radiology report agrees that there is no evidence of acute CVA, some sequela of prior injury or ischemia. [CF]  0202 I talked with the patient who has been resting comfortably.  We discussed the results, and I reiterated that we cannot tell her definitively that he did not have a TIA.  The patient understands this.  I offered hospitalization for further evaluation by neurology but he would prefer to go home, and I think that is very reasonable given the mild symptoms that have not recurred.  I gave my usual and customary follow-up recommendations and return precautions patient understands and agrees with the plan. [CF]    Clinical Course User Index [CF] Hinda Kehr, MD [PR] Merlyn Lot, MD     Medications  iohexol (OMNIPAQUE) 300 MG/ML solution 100 mL (100 mLs Intravenous Contrast Given 10/20/21 2222)     ED Discharge Orders     None      Final diagnoses:  Dizziness     Hinda Kehr, MD 10/21/21 [phone]

## 2021-10-23 ENCOUNTER — Ambulatory Visit: Payer: Medicare Other | Admitting: Gastroenterology

## 2021-10-24 ENCOUNTER — Ambulatory Visit: Payer: Medicare Other | Admitting: Family Medicine

## 2021-10-26 ENCOUNTER — Ambulatory Visit: Payer: Medicare Other | Admitting: Family Medicine

## 2021-10-26 ENCOUNTER — Encounter: Payer: Self-pay | Admitting: Family Medicine

## 2021-10-26 VITALS — BP 134/80 | HR 66 | Temp 97.7°F | Ht 71.0 in | Wt 182.1 lb

## 2021-10-26 DIAGNOSIS — H539 Unspecified visual disturbance: Secondary | ICD-10-CM

## 2021-10-26 DIAGNOSIS — E1140 Type 2 diabetes mellitus with diabetic neuropathy, unspecified: Secondary | ICD-10-CM

## 2021-10-26 DIAGNOSIS — R5383 Other fatigue: Secondary | ICD-10-CM

## 2021-10-26 LAB — TSH: TSH: 2.4 u[IU]/mL (ref 0.35–5.50)

## 2021-10-26 LAB — VITAMIN D 25 HYDROXY (VIT D DEFICIENCY, FRACTURES): VITD: 25.17 ng/mL — ABNORMAL LOW (ref 30.00–100.00)

## 2021-10-26 LAB — VITAMIN B12: Vitamin B-12: 249 pg/mL (ref 211–911)

## 2021-10-26 NOTE — Patient Instructions (Addendum)
Call My EYE doctor to have urgent eval of vision to rule out emergent eye cause of visual change.  Please stop at the lab to have labs drawn.  Move Lantus to bedtime... call  with update.  If still spikes after breakfast and lunch.. we will add fast acting insulin at those times

## 2021-10-26 NOTE — Progress Notes (Signed)
Patient ID: Alexander Yang, male    DOB: 07-Jun-1940, 81 y.o.   MRN: [id number]  This visit was conducted in person.  BP 134/80   Pulse 66   Temp 97.7 F (36.5 C) (Temporal)   Ht 5' 11"  (1.803 m)   Wt 182 lb 2 oz (82.6 kg)   SpO2 98%   BMI 25.40 kg/m    CC:  Chief Complaint  Patient presents with   Hospitalization Follow-up    Seen on 10/20/21 at [facility] ED, dx dizziness.     Subjective:   HPI: Alexander Yang is a 81 y.o. male presenting on 10/26/2021 for Hospitalization Follow-up (Seen on 10/20/21 at [facility] ED, dx dizziness. )  Reviewed ER visit from 10/20/2021 Seen for sudden onset of dizziness/room spinning.  Sudden onset when sitting eating. Lasted 25 sec. EKG was unremarkable, troponin negative. Labs unremarkable.. no anemia, CT head: Chronic atrophic and ischemic changes without acute intracranial abnormality.  CT abdomen/pelvis:Nonobstructing left renal stones stable from the prior study.  Mild diverticulosis without diverticulitis.   MRI brain without contrast:  1. No acute intracranial abnormality. 2. Small focus of cortical encephalomalacia at the peripheral right temporal lobe, which could be related to prior ischemia or trauma. 3. Mild age-related cerebral atrophy with chronic small vessel ischemic disease.     Today he reports he was doing well until an episode recurred last night. Sensation of everything moving in vision, both eyes. Not really spinning.  No blackness, no shade being drawn.  No nausea. No headache.  Again lasted 25 seconds or so.  Last vision few months ago at [place], new glasses. Has appt to see opthalmologic for watery eye in  11/2021. No new medication.  Stopped SSRI  a while ago given SE.  He also reports feeling very tired. May be associated with  Darolutamide he started 1 month ago as SE.   DM still not well controlled.Marland Kitchen FBS 11-120, better controlled later in day... spikes after breakfast and lunch, not after dinner. Lab Results   Component Value Date   HGBA1C 8.2 (A) 08/25/2021     Relevant past medical, surgical, family and social history reviewed and updated as indicated. Interim medical history since our last visit reviewed. Allergies and medications reviewed and updated. Outpatient Medications Prior to Visit  Medication Sig Dispense Refill   ACCU-CHEK AVIVA PLUS test strip USE TO CHECK BLOOD SUGAR 2 TIMES A DAY. DX: E11.49 100 strip 6   amLODipine (NORVASC) 5 MG tablet Take 1 tablet (5 mg total) by mouth daily. 90 tablet 3   atorvastatin (LIPITOR) 40 MG tablet TAKE 1 TABLET BY MOUTH ONCE DAILY 90 tablet 3   Blood Glucose Monitoring Suppl (ACCU-CHEK AVIVA PLUS) w/Device KIT Use to check blood sugar 2 times a day.  Dx: E11.49 1 kit 0   CALCIUM-VITAMIN D PO Take 2 tablets by mouth daily.     CINNAMON PO Take 1 capsule by mouth daily.     Continuous Blood Gluc Receiver (FREESTYLE LIBRE 2 READER) DEVI 1 each by Does not apply route daily. 1 each 2   Continuous Blood Gluc Sensor (FREESTYLE LIBRE 2 SENSOR) MISC 1 each by Does not apply route daily. 2 each 3   Continuous Glucose Monitor Sup KIT Check blood sugar continuously.  E11.49 1 kit 0   darolutamide (NUBEQA) 300 MG tablet Take 600 mg by mouth 2 (two) times daily with a meal.     glipiZIDE (GLUCOTROL XL) 10 MG 24 hr tablet Take 1  tablet (10 mg total) by mouth daily with breakfast. 90 tablet 3   Insulin Pen Needle (PEN NEEDLES) 32G X 4 MM MISC Use to inject insulin daily 100 each 3   ketoconazole (NIZORAL) 2 % cream Apply 1 application topically daily as needed for irritation.     Lancets (ACCU-CHEK SOFT TOUCH) lancets Use to check blood sugar 2 times a day.  Dx: E11.49 100 each 11   Lancets Misc. (ACCU-CHEK SOFTCLIX LANCET DEV) KIT Use to check blood sugar 2 times a day.  Dx: E11.49 1 kit 0   LANTUS SOLOSTAR 100 UNIT/ML Solostar Pen INJECT 30 UNITS INTO THE SKIN DAILY 15 mL 3   Leuprolide Acetate (ELIGARD [place]) Inject 1 Dose into the skin every 6 (six) months.      losartan (COZAAR) 100 MG tablet TAKE 1 TABLET BY MOUTH ONCE DAILY 90 tablet 3   Probiotic Product (ULTRAFLORA IMMUNE HEALTH PO) Take 1 capsule by mouth daily.     metoCLOPramide (REGLAN) 10 MG tablet Take 1 tablet (10 mg total) by mouth 4 (four) times daily -  before meals and at bedtime. (Patient not taking: Reported on 10/26/2021) 120 tablet 0   aspirin 81 MG tablet Take 81 mg by mouth every other day.     DULoxetine (CYMBALTA) 30 MG capsule Start 1 tablet daily at bedtime and if tolerated after 1 week increase to 2 tablets daily at bedtime. 60 capsule 3   No facility-administered medications prior to visit.     Per HPI unless specifically indicated in ROS section below Review of Systems  Constitutional:  Positive for fatigue. Negative for fever.  HENT:  Negative for ear pain.   Eyes:  Negative for pain.  Respiratory:  Negative for cough and shortness of breath.   Cardiovascular:  Negative for chest pain, palpitations and leg swelling.  Gastrointestinal:  Positive for abdominal pain and nausea.  Genitourinary:  Negative for dysuria.  Musculoskeletal:  Negative for arthralgias.  Neurological:  Positive for light-headedness. Negative for syncope and headaches.  Psychiatric/Behavioral:  Negative for dysphoric mood.    Objective:  BP 134/80   Pulse 66   Temp 97.7 F (36.5 C) (Temporal)   Ht 5' 11"  (1.803 m)   Wt 182 lb 2 oz (82.6 kg)   SpO2 98%   BMI 25.40 kg/m   Wt Readings from Last 3 Encounters:  10/26/21 182 lb 2 oz (82.6 kg)  10/20/21 180 lb (81.6 kg)  10/20/21 180 lb 6.4 oz (81.8 kg)      Physical Exam Constitutional:      Appearance: He is well-developed.  HENT:     Head: Normocephalic.     Right Ear: Hearing normal.     Left Ear: Hearing normal.     Nose: Nose normal.  Neck:     Thyroid: No thyroid mass or thyromegaly.     Vascular: No carotid bruit.     Trachea: Trachea normal.  Cardiovascular:     Rate and Rhythm: Normal rate and regular rhythm.     Pulses:  Normal pulses.     Heart sounds: Heart sounds not distant. No murmur heard.    No friction rub. No gallop.     Comments: No peripheral edema Pulmonary:     Effort: Pulmonary effort is normal. No respiratory distress.     Breath sounds: Normal breath sounds.  Skin:    General: Skin is warm and dry.     Findings: No rash.  Neurological:  General: No focal deficit present.     Mental Status: He is alert and oriented to person, place, and time.     Cranial Nerves: Cranial nerves 2-12 are intact.     Sensory: Sensation is intact.     Motor: Motor function is intact.  Psychiatric:        Speech: Speech normal.        Behavior: Behavior normal.        Thought Content: Thought content normal.       Results for orders placed or performed during the hospital encounter of 10/20/21  CBC  Result Value Ref Range   WBC 4.9 4.0 - 10.5 K/uL   RBC 4.28 4.22 - 5.81 MIL/uL   Hemoglobin 13.2 13.0 - 17.0 g/dL   HCT 39.6 39.0 - 52.0 %   MCV 92.5 80.0 - 100.0 fL   MCH 30.8 26.0 - 34.0 pg   MCHC 33.3 30.0 - 36.0 g/dL   RDW 12.2 11.5 - 15.5 %   Platelets 175 150 - 400 K/uL   nRBC 0.0 0.0 - 0.2 %  Comprehensive metabolic panel  Result Value Ref Range   Sodium 141 135 - 145 mmol/L   Potassium 4.0 3.5 - 5.1 mmol/L   Chloride 110 98 - 111 mmol/L   CO2 26 22 - 32 mmol/L   Glucose, Bld 123 (H) 70 - 99 mg/dL   BUN 21 8 - 23 mg/dL   Creatinine, Ser 0.81 0.61 - 1.24 mg/dL   Calcium 9.0 8.9 - 10.3 mg/dL   Total Protein 7.0 6.5 - 8.1 g/dL   Albumin 4.0 3.5 - 5.0 g/dL   AST 19 15 - 41 U/L   ALT 22 0 - 44 U/L   Alkaline Phosphatase 36 (L) 38 - 126 U/L   Total Bilirubin 1.2 0.3 - 1.2 mg/dL   GFR, Estimated >60 >60 mL/min   Anion gap 5 5 - 15  Lipase, blood  Result Value Ref Range   Lipase 32 11 - 51 U/L  Troponin I (High Sensitivity)  Result Value Ref Range   Troponin I (High Sensitivity) 7 <18 ng/L     COVID 19 screen:  No recent travel or known exposure to COVID19 The patient denies  respiratory symptoms of COVID 19 at this time. The importance of social distancing was discussed today.   Assessment and Plan Problem List Items Addressed This Visit     Controlled type 2 diabetes mellitus with diabetic neuropathy, without long-term current use of insulin ([place])   Other fatigue    Acute, evaluate with labs.      Relevant Orders   TSH (Completed)   Vitamin B12 (Completed)   VITAMIN D 25 Hydroxy (Vit-D Deficiency, Fractures) (Completed)   Visual changes - Primary    Unclear etiology  ER evaluation unremarkable including MRI brain. He will move forward with ophthalmology evaluation.      Orders Placed This Encounter  Procedures   TSH   Vitamin B12   VITAMIN D 25 Hydroxy (Vit-D Deficiency, Fractures)       Eliezer Lofts, MD

## 2021-10-28 ENCOUNTER — Encounter: Payer: Self-pay | Admitting: Family Medicine

## 2021-10-31 ENCOUNTER — Ambulatory Visit: Payer: Medicare Other | Admitting: Allergy & Immunology

## 2021-10-31 ENCOUNTER — Encounter: Payer: Self-pay | Admitting: Allergy & Immunology

## 2021-10-31 VITALS — BP 120/78 | Temp 98.4°F | Resp 16 | Ht 71.0 in | Wt 181.1 lb

## 2021-10-31 DIAGNOSIS — K9049 Malabsorption due to intolerance, not elsewhere classified: Secondary | ICD-10-CM | POA: Diagnosis not present

## 2021-10-31 DIAGNOSIS — R109 Unspecified abdominal pain: Secondary | ICD-10-CM

## 2021-10-31 NOTE — Patient Instructions (Addendum)
1. Stomach pain - This certainly does not seem consistent with a food allergy. - We are going to get the most common foods via the blood. - We are also going to get an alpha gal panel - I would guess that this NOT related to your foods at all.   2. Return in about 3 months (around 01/31/2022).    Please inform us of any Emergency Department visits, hospitalizations, or changes in symptoms. Call us before going to the ED for breathing or allergy symptoms since we might be able to fit you in for a sick visit. Feel free to contact us anytime with any questions, problems, or concerns.  It was a pleasure to meet you today!  Websites that have reliable patient information: 1. American Academy of Asthma, Allergy, and Immunology: www.aaaai.org 2. Food Allergy Research and Education (FARE): foodallergy.org 3. Mothers of Asthmatics: http://www.asthmacommunitynetwork.org 4. American College of Allergy, Asthma, and Immunology: www.acaai.org   COVID-19 Vaccine Information can be found at: ShippingScam.co.uk For questions related to vaccine distribution or appointments, please email [email or web address] or call [phone].   We realize that you might be concerned about having an allergic reaction to the COVID19 vaccines. To help with that concern, WE ARE OFFERING THE COVID19 VACCINES IN OUR OFFICE! Ask the front desk for dates!     "Like" [place] on Facebook and Instagram for our latest updates!      A healthy democracy works best when [employer] participate! Make sure you are registered to vote! If you have moved or changed any of your contact information, you will need to get this updated before voting!  In some cases, you MAY be able to register to vote online: [email or web address]

## 2021-10-31 NOTE — Progress Notes (Unsigned)
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 Stomach pain - This certainly does not seem consistent with a food allergy. - We are going to get the most common foods via the blood. - We are also going to get an alpha gal panel - I would guess that this NOT related to your foods at all.   2. Return in about 3 months (around 01/31/2022).    This note in its entirety was forwarded to the Provider who requested this consultation.  Subjective:   Alexander Yang is a 81 y.o. male presenting today for evaluation of  Chief Complaint  Patient presents with   [place] has a history of the following: Patient Active Problem List   Diagnosis Date Noted   Chronic abdominal pain 08/25/2021   Chronic insomnia 08/19/2019   Aortic atherosclerosis ([place]) 06/19/2016   Diabetic peripheral neuropathy associated with type 2 diabetes mellitus ([place]) 08/23/2015   Counseling regarding end of life decision making 08/31/2014   Hypertension associated with diabetes ([place]) 01/27/2013   Low testosterone 07/28/2012   ED (erectile dysfunction) 01/25/2012   MICROALBUMINURIA 09/14/2009   DERMATITIS, SEBORRHEIC 01/06/2009   Hyperlipidemia associated with type 2 diabetes mellitus ([place]) 06/23/2007   Diabetes mellitus with neurological manifestations, controlled ([place]) 10/22/2006   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  Alexander Yang was referred by Jinny Sanders, MD.     This is a [place] name and it is pronounced [place] just like it is spelled. 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    Alexander Yang is a 81 y.o. male presenting for an evaluation of stomach pains and concerns with food allergies.  He describes a couple of different pains. He denies that this is cramping. When he wakes up in the morning between 6 and 7am, he has a stomach pain that emerges and he cannot go back to sleep. This lasts all day until around 5 pm and then it goes away completely. He reports that it is an abrupt feeling of feeling perfectly fine. He thinks that this is going to bode well for the next day but then it happens again the next day. This is every day. He reports that he looked back in his medications lists and he was prescribed reflux medications around 2-3 years ago. It has become more frequent over time.   He thinks that this might be related to his new prostate cancer shot (he gets Degarelix every 6 months). He first got this in June 2023. Prior to this, it was a monthly shot which he was receiving every month. He talked to his oncologist about this and was told that this would not cause the stomach pain.   He was diagnosed with prostate cancer in 2011.  He had a Gleason score of 8 and received radiation therapy between October 2011 and December 2011.  He received concomitant Taxotere chemotherapy as part of a clinical trial in 6 months of hormone therapy until July 2012.  His PSA decreased down to 0.1 in December 2013.  In December 2015, his PSA started to increase  again.  In March 2018, he was started on Lupron injections or androgen deprivation therapy.  He did this through October 2018.  He then started getting Eligard monthly in August 2019.  He changed to Degarelix injections every 6 months in March 2023 in light of the Eligard.  He now follows with oncology at the [facility].  He has been to see GI in the [place]. He has undergone a number of tests and all of this was normal. He did not have gastroparesis. He has had a colonoscopy as well as endoscopy.   He notices no change at all with food exposure. It is  consistently bad in the morning and then continues throughout the day. He likes peanuts, tree nuts, eggs, and all of the other food allergens.   He sees Dr. Loletha Carrow at [facility]. He had an abdominal CT in August 2023 that showed the following:   Hepatobiliary: No focal liver abnormality is seen. No gallstones, gallbladder wall thickening, or biliary dilatation.   Pancreas: Unremarkable. No pancreatic ductal dilatation or surrounding inflammatory changes.   Spleen: Normal in size without focal abnormality.   Adrenals/Urinary Tract: Adrenal glands are within normal limits. Kidneys are well visualized bilaterally. Normal enhancement pattern is seen. No obstructive changes are seen. A 3 mm nonobstructing left renal stone is noted stable from the prior exam. Ureters are within normal limits. Bladder is well distended.   Stomach/Bowel: The appendix is not well visualized. No inflammatory changes to suggest appendicitis are noted. Mild diverticular change in the sigmoid colon is seen without diverticulitis. Small bowel and stomach are within normal limits.   Vascular/Lymphatic: Aortic atherosclerosis. No enlarged abdominal or pelvic lymph nodes. Left retroaortic renal vein is noted.   Reproductive: Fiducial markers are noted within the prostate   Other: No abdominal wall hernia or abnormality. No abdominopelvic ascites.   Musculoskeletal: Degenerative changes of the lumbar spine are noted.   IMPRESSION: Nonobstructing left renal stones stable from the prior study.   Mild diverticulosis without diverticulitis.  He has a history of prostate cancer that was diagnosed in 2009. He had some radiation treatment and was fine until 2017 or so. Pet scans have been negative. 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  Otherwise, there is no history of other atopic diseases, including asthma, food allergies, drug allergies, environmental allergies, stinging insect allergies, eczema, urticaria, or contact  dermatitis. There is no significant infectious history. Vaccinations are up to date.    Past Medical History: Patient Active Problem List   Diagnosis Date Noted   Chronic abdominal pain 08/25/2021   Chronic insomnia 08/19/2019   Aortic atherosclerosis ([place]) 06/19/2016   Diabetic peripheral neuropathy associated with type 2 diabetes mellitus ([place]) 08/23/2015   Counseling regarding end of life decision making 08/31/2014   Hypertension associated with diabetes ([place]) 01/27/2013   Low testosterone 07/28/2012   ED (erectile dysfunction) 01/25/2012   MICROALBUMINURIA 09/14/2009   DERMATITIS, SEBORRHEIC 01/06/2009   Hyperlipidemia associated with type 2 diabetes mellitus ([place]) 06/23/2007   Diabetes mellitus with neurological manifestations, controlled ([place]) 10/22/2006   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 If you have any questions, ask your nurse or  doctor.          Accu-Chek Aviva Plus test strip Generic drug: glucose blood USE TO CHECK BLOOD SUGAR 2 TIMES A DAY. DX: E11.49   Accu-Chek Aviva Plus w/Device Kit Use to check blood sugar 2 times a day.  Dx: E11.49   accu-chek soft touch lancets Use to check blood sugar 2 times a day.  Dx: E11.49   Accu-Chek Softclix Lancet Dev Kit Use to check blood sugar 2 times a day.  Dx: E11.49   amLODipine 5 MG tablet Commonly known as: NORVASC Take 1 tablet (5 mg total) by mouth daily.   atorvastatin 40 MG tablet Commonly known as: LIPITOR TAKE 1 TABLET BY MOUTH ONCE DAILY   CALCIUM-VITAMIN D PO Take 2 tablets by mouth daily.   CINNAMON PO Take 1 capsule by mouth daily.   Continuous Glucose Monitor Sup Kit Check blood sugar continuously.  E11.49    darolutamide 300 MG tablet Commonly known as: NUBEQA Take 600 mg by mouth 2 (two) times daily with a meal.   ELIGARD [place] Inject 1 Dose into the skin every 6 (six) months.   FreeStyle Libre 2 [place] 1 each by Does not apply route daily.   FreeStyle Libre 2 Sensor Misc 1 each by Does not apply route daily.   glipiZIDE 10 MG 24 hr tablet Commonly known as: Glucotrol XL Take 1 tablet (10 mg total) by mouth daily with breakfast.   ketoconazole 2 % cream Commonly known as: NIZORAL Apply 1 application topically daily as needed for irritation.   Lantus SoloStar 100 UNIT/ML Solostar Pen Generic drug: insulin glargine INJECT 30 UNITS INTO THE SKIN DAILY   losartan 100 MG tablet Commonly known as: COZAAR TAKE 1 TABLET BY MOUTH ONCE DAILY   metoCLOPramide 10 MG tablet Commonly known as: REGLAN Take 1 tablet (10 mg total) by mouth 4 (four) times daily -  before meals and at bedtime.   Pen Needles 32G X 4 MM Misc Use to inject insulin daily   ULTRAFLORA IMMUNE HEALTH PO Take 1 capsule by mouth daily.        Birth History: non-contributory  Developmental History: non-contributory  Past Surgical History: Past Surgical History:  Procedure Laterality Date   ACL Tear  03/12/1978   PROSTATE BIOPSY     TONSILLECTOMY  03/13/1947   VASECTOMY       Family History: Family History  Problem Relation Age of Onset   Cerebral aneurysm Mother    Diabetes Father    Bradycardia Father    Benign prostatic hyperplasia Father    Supraventricular tachycardia Brother    Supraventricular tachycardia Brother    Cancer Cousin        maternal; unknown type, dx late 2s   Esophageal cancer Neg Hx    Stomach cancer Neg Hx    Rectal cancer Neg Hx      Social History: Latwan lives at home with his wife of 58 years. That is 7 years old.  There is wood laminate throughout the home.  They have gas heating and central cooling.  There are no animals inside or outside of the home.  There  are dust mite covers on the bedding including the pillows.  There is no tobacco exposure.  He is currently retired from [employer], where he worked for 30 years.  He retired in 1997.  There is no fume, chemical, or dust exposure.  They do not have a HEPA filter in the home.  They do not  live near an interstate or industrial area.  He smoked for 8 years from 1963 1968.   Review of Systems  Constitutional: Negative.  Negative for fever, malaise/fatigue and weight loss.  HENT: Negative.  Negative for congestion, ear discharge and ear pain.   Eyes:  Negative for pain, discharge and redness.  Respiratory:  Negative for cough, sputum production, shortness of breath and wheezing.   Cardiovascular: Negative.  Negative for chest pain and palpitations.  Gastrointestinal:  Positive for abdominal pain. Negative for heartburn.  Skin: Negative.  Negative for itching and rash.  Neurological:  Negative for dizziness and headaches.  Endo/Heme/Allergies:  Negative for environmental allergies. Does not bruise/bleed easily.       Objective:   Blood pressure 120/78, temperature 98.4 F (36.9 C), resp. rate 16, height 5' 11"  (1.803 m), weight 181 lb 2 oz (82.2 kg). Body mass index is 25.26 kg/m.     Physical Exam Vitals reviewed.  Constitutional:      Appearance: He is well-developed.     Comments: Very talkative.  HENT:     Head: Normocephalic and atraumatic.     Right Ear: Tympanic membrane, ear canal and external ear normal. No drainage, swelling or tenderness. Tympanic membrane is not injected, scarred, erythematous, retracted or bulging.     Left Ear: Tympanic membrane, ear canal and external ear normal. No drainage, swelling or tenderness. Tympanic membrane is not injected, scarred, erythematous, retracted or bulging.     Nose: No nasal deformity, septal deviation, mucosal edema or rhinorrhea.     Right Sinus: No maxillary sinus tenderness or frontal sinus tenderness.     Left Sinus: No maxillary  sinus tenderness or frontal sinus tenderness.     Mouth/Throat:     Mouth: Mucous membranes are not pale and not dry.     Pharynx: Uvula midline.  Eyes:     General:        Right eye: No discharge.        Left eye: No discharge.     Conjunctiva/sclera: Conjunctivae normal.     Right eye: Right conjunctiva is not injected. No chemosis.    Left eye: Left conjunctiva is not injected. No chemosis.    Pupils: Pupils are equal, round, and reactive to light.  Cardiovascular:     Rate and Rhythm: Normal rate and regular rhythm.     Heart sounds: Normal heart sounds.  Pulmonary:     Effort: Pulmonary effort is normal. No tachypnea, accessory muscle usage or respiratory distress.     Breath sounds: Normal breath sounds. No wheezing, rhonchi or rales.  Chest:     Chest wall: No tenderness.  Abdominal:     Tenderness: There is no abdominal tenderness. There is no guarding or rebound.  Lymphadenopathy:     Head:     Right side of head: No submandibular, tonsillar or occipital adenopathy.     Left side of head: No submandibular, tonsillar or occipital adenopathy.     Cervical: No cervical adenopathy.  Skin:    Coloration: Skin is not pale.     Findings: No abrasion, erythema, petechiae or rash. Rash is not papular, urticarial or vesicular.  Neurological:     Mental Status: He is alert.  Psychiatric:        Behavior: Behavior is cooperative.      Diagnostic studies: labs sent instead         Salvatore Marvel, MD [facility] [place]

## 2021-11-01 ENCOUNTER — Encounter: Payer: Self-pay | Admitting: Oncology

## 2021-11-01 ENCOUNTER — Encounter: Payer: Self-pay | Admitting: Allergy & Immunology

## 2021-11-02 LAB — ALPHA-GAL PANEL
Allergen Lamb IgE: <0.1 kU/L
Beef IgE: <0.1 kU/L
IgE (Immunoglobulin E), Serum: 17 IU/mL (ref 6–495)
O215-IgE Alpha-Gal: <0.1 kU/L
Pork IgE: <0.1 kU/L

## 2021-11-04 LAB — ALLERGY PANEL 19, SEAFOOD GROUP
Allergen Salmon IgE: <0.1 kU/L
Catfish: <0.1 kU/L
Codfish IgE: <0.1 kU/L
F023-IgE Crab: <0.1 kU/L
F080-IgE Lobster: <0.1 kU/L
Shrimp IgE: <0.1 kU/L
Tuna: <0.1 kU/L

## 2021-11-04 LAB — IGE NUT PROF. W/COMPONENT RFLX
F017-IgE Hazelnut (Filbert): <0.1 kU/L
F018-IgE Brazil Nut: <0.1 kU/L
F020-IgE Almond: <0.1 kU/L
F202-IgE Cashew Nut: <0.1 kU/L
F203-IgE Pistachio Nut: <0.1 kU/L
F256-IgE Walnut: <0.1 kU/L
Macadamia Nut, IgE: <0.1 kU/L
Peanut, IgE: <0.1 kU/L
Pecan Nut IgE: <0.1 kU/L

## 2021-11-04 LAB — EGG COMPONENT PANEL
F232-IgE Ovalbumin: <0.1 kU/L
F233-IgE Ovomucoid: <0.1 kU/L

## 2021-11-04 LAB — XANTHAN GUM IGE
Class Interpretation: 0
Xanthan Gum, IgE*: <0.35 kU/L (ref <0.35)

## 2021-11-04 LAB — MILK COMPONENT PANEL
F076-IgE Alpha Lactalbumin: <0.1 kU/L
F077-IgE Beta Lactoglobulin: <0.1 kU/L
F078-IgE Casein: <0.1 kU/L

## 2021-11-04 LAB — ALLERGEN GUM CARAGEENAN
Carageenan Gum, IgE: <0.35 kU/L (ref <0.35)
Class Interpretation: 0

## 2021-11-04 LAB — ALLERGEN SOYBEAN: Soybean IgE: <0.1 kU/L

## 2021-11-04 LAB — ALLERGEN, WHEAT, F4: Wheat IgE: <0.1 kU/L

## 2021-11-04 LAB — ALLERGEN SESAME F10: Sesame Seed IgE: <0.1 kU/L

## 2021-11-04 LAB — F297-IGE ACACIA GUM: F297-IgE Acacia Gum: <0.1 kU/L

## 2021-11-06 ENCOUNTER — Other Ambulatory Visit: Payer: Self-pay | Admitting: Family Medicine

## 2021-11-06 NOTE — Telephone Encounter (Signed)
Last office visit 10/26/21 for hospital follow up.  Last refilled 09/13/21 for #120 with no refills.  Next Appt: 11/23/21 for follow up.

## 2021-11-09 ENCOUNTER — Encounter: Payer: Self-pay | Admitting: Allergy & Immunology

## 2021-11-15 ENCOUNTER — Ambulatory Visit: Payer: Medicare Other | Admitting: Physician Assistant

## 2021-11-15 ENCOUNTER — Encounter: Payer: Self-pay | Admitting: Physician Assistant

## 2021-11-15 VITALS — BP 140/64 | HR 76 | Ht 70.25 in | Wt 182.0 lb

## 2021-11-15 DIAGNOSIS — R1013 Epigastric pain: Secondary | ICD-10-CM

## 2021-11-15 DIAGNOSIS — R110 Nausea: Secondary | ICD-10-CM

## 2021-11-15 MED ORDER — PROMETHAZINE HCL 25 MG PO TABS: 25.0000 mg | ORAL_TABLET | Freq: Every day | ORAL | 0 refills | Status: DC

## 2021-11-15 NOTE — Patient Instructions (Addendum)
If you are age 81 or older, your body mass index should be between 23-30. Your Body mass index is 25.93 kg/m. If this is out of the aforementioned range listed, please consider follow up with your Primary Care Provider.  If you are age 64 or younger, your body mass index should be between 19-25. Your Body mass index is 25.93 kg/m. If this is out of the aformentioned range listed, please consider follow up with your Primary Care Provider.   ________________________________________________________  The [facility] providers would like to encourage you to use [facility] to communicate with providers for non-urgent requests or questions.  Due to long hold times on the telephone, sending your provider a message by [facility] may be a faster and more efficient way to get a response.  Please allow 48 business hours for a response.  Please remember that this is for non-urgent requests.  _______________________________________________________   We have sent the following medications to your pharmacy for you to pick up at your convenience: Phenegran 25 mg 1 tablet at bedtime  Start FDgard 2 capsules 3 times daily. If it does not help, start Phenegran.   We have scheduled your follow up with Anderson Malta on 12/21/21 at 11 am.  It was a pleasure to see you today!  Thank you for trusting me with your gastrointestinal care!

## 2021-11-15 NOTE — Progress Notes (Signed)
Chief Complaint: Stomach pain and nausea  HPI:    Alexander Yang is an 81 year old Caucasian male, known to Dr. Loletha Carrow, with a past medical history of prostate cancer status post radiation, IBS and colon polyps, who was referred to me by Jinny Sanders, MD for a complaint of abdominal pain and nausea.      02/13/2018 colonoscopy with examined portion of the ileum normal, increased mucosal vascular pattern in the distal rectum, one 3 mm hyperplastic polyp in the sigmoid colon and diverticulosis.  No repeat recommended due to age.     02/14/2021 patient seen in clinic by Tye Savoy, NP and assigned to Dr. Loletha Carrow.  At that time discussed daily ongoing nausea without vomiting and occasional bloating and mid upper abdominal discomfort.  At that time discussed an unrevealing CT scan in April and previously no improvement with Pepto-Bismol, Gas-X, Dicyclomine, Famotidine or Zofran.  At that time scheduled for an EGD.     03/31/2021 EGD with normal esophagus, erythematous mucosa in the prepyloric region of the stomach and otherwise normal.  Biopsies were normal.    10/20/2021 CT of the abdomen pelvis with contrast with nonobstructing left renal stones which were stable from prior study and mild diverticulosis.    10/20/2021 MRI of the brain without contrast showed no acute intracranial abnormality, small focus of cortical encephalomalacia at the peripheral right temporal lobe which could be related to prior skiing or trauma and mild age-related cerebral atrophy with chronic small vessel ischemic disease.    09/10/2021 patient contacted [place] and described that his stomach issues resemble gastroparesis in spite of passing the gastric emptying test and he wondered if he had functional dyspepsia which she read was similar to gastroparesis.  That time Dr. Loletha Carrow discussed that he likely had functional nausea/dyspepsia.  Was recommended he try the Cymbalta.  Patient replied saying this made him very nauseous and dizzy for a  week.    10/31/2021 patient followed with an allergist to test for food allergies given that he continued with abdominal pain.  They did not think it was likely this was related to allergies but were testing him anyways.  Alpha gal testing was negative.  He was told he did not have a food allergy.    Today, the patient explains that nothing has changed with his symptoms.  Describes that he wakes up around 6 or 7 and his stomach is aching rated as a 2-3/10 and then he can't go back to sleep so he gets up and " plays a little online poker for distraction", and then will have breakfast which seems to make things slightly worse and then lunch which also seems to make symptoms slightly worse no matter what he eats, but then magically sometime around 4 to 6 PM his symptoms seem to get better.  Tells me that he has varied times of eating dinner anywhere from 400-8 o'clock pm and his symptoms always get better around this timeframe in the evening even if he has not eaten yet.  He then feels good really until he goes to bed and until it wakes him back up in the morning.  A few months ago apparently his PCP tried him on some Reglan and he thought that maybe made things a little bit better but he is not sure if that was "just in my head", but then was told about the side effects so stopped using it.  Does tell me he had a gastric emptying study at the [place],  but apparently this was done a month and a half ago while he was taking Reglan, which he tells me was normal.  Also tells me that occasionally if the pain seems to increase to a 5 or 6 he feels like if he gets up and walks or goes down to the gym and works on the treadmill and is able to burp a little then it helps a little bit with the discomfort.  He has tried various medications as listed below and nothing is ever helped.  Most recently he tried Cymbalta which really made him feel awful and he was unable to tolerate.  Zofran previously with no difference.  Treatment for  possible SIBO previously with no difference.  Associated symptoms include continual nausea.    Patient does have a diagnosis of stage IV prostate cancer.  Tells me that he discuss his abdominal pain with his oncologist at the [facility] and they think it is due to depression.  Patient tells me he does not really feel depressed, he is okay with the fact that he will not be on this earth forever.  Tells me he thinks maybe he started with abdominal pain and now feels depressed because of his daily symptoms.    Denies fever, chills or blood in his stool.  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 DX: E11.49 100 strip 6   amLODipine (NORVASC) 5 MG tablet Take 1 tablet (5 mg total) by mouth daily. 90 tablet 3   atorvastatin (LIPITOR) 40 MG tablet TAKE 1 TABLET BY MOUTH ONCE DAILY 90 tablet 3   Blood Glucose Monitoring Suppl (ACCU-CHEK AVIVA PLUS) w/Device KIT Use to check blood sugar 2 times a day.  Dx: E11.49 1 kit 0   CALCIUM-VITAMIN D PO Take 2 tablets by mouth daily.     CINNAMON PO Take 1 capsule by mouth daily.     Continuous Blood Gluc Receiver (FREESTYLE LIBRE 2 READER) DEVI 1 each by Does not apply route daily. 1 each 2   Continuous Blood Gluc Sensor (FREESTYLE LIBRE 2 SENSOR) MISC 1 each by Does not apply route daily. 2 each 3   Continuous Glucose Monitor Sup KIT Check  blood sugar continuously.  E11.49 1 kit 0   darolutamide (NUBEQA) 300 MG tablet Take 600 mg by mouth 2 (two) times daily with a meal.     glipiZIDE (GLUCOTROL XL) 10 MG 24 hr tablet Take 1 tablet (10 mg total) by mouth daily with breakfast. 90 tablet 3   ketoconazole (NIZORAL) 2 % cream Apply 1 application topically daily as needed for irritation.     LANTUS SOLOSTAR 100 UNIT/ML Solostar Pen INJECT 30 UNITS INTO THE SKIN DAILY 15 mL 3   Leuprolide Acetate (ELIGARD [place]) Inject 1 Dose into the skin every 6 (  six) months.     losartan (COZAAR) 100 MG tablet TAKE 1 TABLET BY MOUTH ONCE DAILY 90 tablet 3   Insulin Pen Needle (PEN NEEDLES) 32G X 4 MM MISC Use to inject insulin daily (Patient not taking: Reported on 11/15/2021) 100 each 3   Lancets (ACCU-CHEK SOFT TOUCH) lancets Use to check blood sugar 2 times a day.  Dx: E11.49 (Patient not taking: Reported on 11/15/2021) 100 each 11   Lancets Misc. (ACCU-CHEK SOFTCLIX LANCET DEV) KIT Use to check blood sugar 2 times a day.  Dx: E11.49 (Patient not taking: Reported on 11/15/2021) 1 kit 0   metoCLOPramide (REGLAN) 10 MG tablet TAKE 1 TABLET BY MOUTH 4 TIMES DAILY BEFORE MEALS AND AT BEDTIME (Patient not taking: Reported on 11/15/2021) 120 tablet 0   No current facility-administered medications for this visit.    Allergies as of 11/15/2021 - Review Complete 11/15/2021  Allergen Reaction Noted   Abiraterone Other (See Comments) 09/14/2021    Family History  Problem Relation Age of Onset   Cerebral aneurysm Mother    Diabetes Father    Bradycardia Father    Benign prostatic hyperplasia Father    Supraventricular tachycardia Brother    Supraventricular tachycardia Brother    Cancer Cousin        maternal; unknown type, dx late 70s   Esophageal cancer Neg Hx    Stomach cancer Neg Hx    Rectal cancer Neg Hx     Social History   Socioeconomic History   Marital status: Married    Spouse name: Not on file   Number of children: 2   Years of education:  Not on file   Highest education level: Not on file  Occupational History   Occupation: Retired  Tobacco Use   Smoking status: Former    Packs/day: 2.00    Years: 10.00    Total pack years: 20.00    Types: Cigarettes    Quit date: 03/12/1965    Years since quitting: 56.7   Smokeless tobacco: Never  Vaping Use   Vaping Use: Never used  Substance and Sexual Activity   Alcohol use: Yes    Alcohol/week: 5.0 standard drinks of alcohol    Types: 2 Glasses of wine, 3 Cans of beer per week   Drug use: No   Sexual activity: Never  Other Topics Concern   Not on file  Social History Narrative   Exercising: 3-4 times a week.   Healthy eating habits.   Full Code   Has a living will, has HCPOA: Physiological scientist (reviewed 2015)   Social Determinants of Health   Financial Resource Strain: Low Risk  (05/25/2020)   Overall Financial Resource Strain (CARDIA)    Difficulty of Paying Living Expenses: Not hard at all  Food Insecurity: No Food Insecurity (05/25/2020)   Hunger Vital Sign    Worried About Running Out of Food in the Last Year: Never true    Ran Out of Food in the Last Year: Never true  Transportation Needs: No Transportation Needs (05/25/2020)   PRAPARE - Hydrologist (Medical): No    Lack of Transportation (Non-Medical): No  Physical Activity: Insufficiently Active (05/25/2020)   Exercise Vital Sign    Days of Exercise per Week: 2 days    Minutes of Exercise per Session: 60 min  Stress: No Stress Concern Present (05/25/2020)   [place]    Feeling of Stress :  Not at all  Social Connections: Not on file  Intimate Partner Violence: Not At Risk (05/25/2020)   Humiliation, Afraid, Rape, and Kick questionnaire    Fear of Current or Ex-Partner: No    Emotionally Abused: No    Physically Abused: No    Sexually Abused: No    Review of Systems:    Constitutional: No fever or  chills Cardiovascular: No chest pain Respiratory: No SOB  Gastrointestinal: See HPI and otherwise negative   Physical Exam:  Vital signs: BP (!) 140/64 (BP Location: Left Arm, Patient Position: Sitting, Cuff Size: Normal)   Pulse 76   Ht 5' 10.25" (1.784 m) Comment: height measured without shoes  Wt 182 lb (82.6 kg)   BMI 25.93 kg/m    Constitutional:   Pleasant elderly Caucasian male appears to be in NAD, Well developed, Well nourished, alert and cooperative Respiratory: Respirations even and unlabored. Lungs clear to auscultation bilaterally.   No wheezes, crackles, or rhonchi.  Cardiovascular: Normal S1, S2. No MRG. Regular rate and rhythm. No peripheral edema, cyanosis or pallor.  Gastrointestinal:  Soft, nondistended, mild epigastric ttp. No rebound or guarding. Normal bowel sounds. No appreciable masses or hepatomegaly. Rectal:  Not performed.  Psychiatric:  Demonstrates good judgement and reason without abnormal affect or behaviors.  Most Recent labs at [place]- we do not have results  Assessment: 1.  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  Discussed what functional dyspepsia means with the patient.  Explained that this is a real diagnosis.  He tells me he could not tolerate the Cymbalta.  We discussed possible Amitriptyline in the evenings in the future if the below does not help. 2.  Started the patient on FDgard 2 tabs 3 times a day.  Provided him with samples in the  clinic.  I would like him to try this consistently for the next week to see if it helps at all.  If not he can discontinue it. 3.  If the above does not help then I prescribed him Phenergan 25 mg to take nightly (patient tells me he is already fatigued from another new medicine he was put on, so he would likely not like to take this during the day) prescribed #10. He will trial this for a week to see if it helps. 4.  If the above does not help then he can discontinue it.  At that point would recommend Amitriptyline 25 mg p.o. nightly for him to trial for a week. 5.  Suggested the patient find something that he enjoys doing throughout the day, it seems like walking and exercising does give him some relief from symptoms.  We discussed functional symptoms in detail and I answered his questions. 6.  Patient was also given a gastroparesis diet handout to see if abiding by some of these rules helps him with his symptoms.  I am not certain that the gastric emptying study done while he was on Reglan was very helpful.  If this continues to be a question  could consider repeating. 7.  Patient to follow in clinic with me in 1 to 2 months or sooner if necessary.  He will contact me through [place] in the meantime to let me know how he is doing.  Ellouise Newer, PA-C [facility] 11/15/2021, 11:01 AM  Cc: Jinny Sanders, MD

## 2021-11-16 ENCOUNTER — Encounter: Payer: Self-pay | Admitting: Physician Assistant

## 2021-11-16 NOTE — Progress Notes (Signed)
____________________________________________________________  Attending physician addendum:  Thank you for sending this case to me. I have reviewed the entire note and appreciate you giving this much consideration to the recommendations.  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 I recommend against the amitriptyline given the chance for anticholinergic side effects in an elderly patient.  Caution also warranted with promethazine for the same reason.   Wilfrid Lund, MD  ____________________________________________________________

## 2021-11-18 ENCOUNTER — Encounter: Payer: Self-pay | Admitting: Family Medicine

## 2021-11-18 DIAGNOSIS — E1140 Type 2 diabetes mellitus with diabetic neuropathy, unspecified: Secondary | ICD-10-CM | POA: Insufficient documentation

## 2021-11-18 DIAGNOSIS — H539 Unspecified visual disturbance: Secondary | ICD-10-CM | POA: Insufficient documentation

## 2021-11-18 DIAGNOSIS — R5383 Other fatigue: Secondary | ICD-10-CM | POA: Insufficient documentation

## 2021-11-18 NOTE — Assessment & Plan Note (Signed)
Unclear etiology  ER evaluation unremarkable including MRI brain. He will move forward with ophthalmology evaluation.

## 2021-11-18 NOTE — Assessment & Plan Note (Signed)
Acute, evaluate with labs.

## 2021-11-18 NOTE — Assessment & Plan Note (Signed)
Chronic, inadequate control  Move Lantus to bedtime... call  with update.  If still spikes after breakfast and lunch.. we will add fast acting insulin at those times

## 2021-11-21 ENCOUNTER — Other Ambulatory Visit (INDEPENDENT_AMBULATORY_CARE_PROVIDER_SITE_OTHER): Payer: Medicare Other

## 2021-11-21 ENCOUNTER — Ambulatory Visit: Payer: Medicare Other | Admitting: Internal Medicine

## 2021-11-21 ENCOUNTER — Encounter: Payer: Self-pay | Admitting: Internal Medicine

## 2021-11-21 ENCOUNTER — Telehealth: Payer: Self-pay | Admitting: Family Medicine

## 2021-11-21 VITALS — BP 120/80 | HR 79 | Ht 70.25 in | Wt 182.0 lb

## 2021-11-21 DIAGNOSIS — E119 Type 2 diabetes mellitus without complications: Secondary | ICD-10-CM | POA: Insufficient documentation

## 2021-11-21 DIAGNOSIS — Z794 Long term (current) use of insulin: Secondary | ICD-10-CM | POA: Diagnosis not present

## 2021-11-21 DIAGNOSIS — Z79899 Other long term (current) drug therapy: Secondary | ICD-10-CM | POA: Diagnosis not present

## 2021-11-21 DIAGNOSIS — R109 Unspecified abdominal pain: Secondary | ICD-10-CM

## 2021-11-21 DIAGNOSIS — E1159 Type 2 diabetes mellitus with other circulatory complications: Secondary | ICD-10-CM

## 2021-11-21 DIAGNOSIS — E1140 Type 2 diabetes mellitus with diabetic neuropathy, unspecified: Secondary | ICD-10-CM

## 2021-11-21 DIAGNOSIS — G8929 Other chronic pain: Secondary | ICD-10-CM

## 2021-11-21 LAB — COMPREHENSIVE METABOLIC PANEL
ALT: 19 U/L (ref 0–53)
AST: 16 U/L (ref 0–37)
Albumin: 3.7 g/dL (ref 3.5–5.2)
Alkaline Phosphatase: 33 U/L — ABNORMAL LOW (ref 39–117)
BUN: 26 mg/dL — ABNORMAL HIGH (ref 6–23)
CO2: 28 mEq/L (ref 19–32)
Calcium: 9 mg/dL (ref 8.4–10.5)
Chloride: 107 mEq/L (ref 96–112)
Creatinine, Ser: 0.9 mg/dL (ref 0.40–1.50)
GFR: 80.5 mL/min (ref >60.00)
Glucose, Bld: 129 mg/dL — ABNORMAL HIGH (ref 70–99)
Potassium: 4.2 mEq/L (ref 3.5–5.1)
Sodium: 142 mEq/L (ref 135–145)
Total Bilirubin: 1 mg/dL (ref 0.2–1.2)
Total Protein: 6.1 g/dL (ref 6.0–8.3)

## 2021-11-21 LAB — LIPID PANEL
Cholesterol: 118 mg/dL (ref 0–200)
HDL: 44.3 mg/dL (ref >39.00)
LDL Cholesterol: 59 mg/dL (ref 0–99)
NonHDL: 73.89
Total CHOL/HDL Ratio: 3
Triglycerides: 74 mg/dL (ref 0.0–149.0)
VLDL: 14.8 mg/dL (ref 0.0–40.0)

## 2021-11-21 LAB — POCT GLYCOSYLATED HEMOGLOBIN (HGB A1C): Hemoglobin A1C: 7.5 % — AB (ref 4.0–5.6)

## 2021-11-21 LAB — POCT GLUCOSE (DEVICE FOR HOME USE): POC Glucose: 155 mg/dl — AB (ref 70–99)

## 2021-11-21 LAB — HEMOGLOBIN A1C: Hgb A1c MFr Bld: 7.3 % — ABNORMAL HIGH (ref 4.6–6.5)

## 2021-11-21 MED ORDER — PEN NEEDLES 32G X 4 MM MISC: 1.0000 | Freq: Every day | 3 refills | Status: DC

## 2021-11-21 MED ORDER — LANTUS SOLOSTAR 100 UNIT/ML ~~LOC~~ SOPN: 24.0000 [IU] | PEN_INJECTOR | Freq: Every day | SUBCUTANEOUS | 3 refills | Status: DC

## 2021-11-21 MED ORDER — GLIPIZIDE ER 10 MG PO TB24: 10.0000 mg | ORAL_TABLET | Freq: Every day | ORAL | 3 refills | Status: DC

## 2021-11-21 NOTE — Patient Instructions (Addendum)
Decrease Lantus 24 units daily  Continue Glipizide 10 mg XL, 1 tablet before Breakfast     HOW TO TREAT LOW BLOOD SUGARS (Blood sugar LESS THAN 70 MG/DL) Please follow the RULE OF 15 for the treatment of hypoglycemia treatment (when your (blood sugars are less than 70 mg/dL)   STEP 1: Take 15 grams of carbohydrates when your blood sugar is low, which includes:  3-4 GLUCOSE TABS  OR 3-4 OZ OF JUICE OR REGULAR SODA OR ONE TUBE OF GLUCOSE GEL    STEP 2: RECHECK blood sugar in 15 MINUTES STEP 3: If your blood sugar is still low at the 15 minute recheck --> then, go back to STEP 1 and treat AGAIN with another 15 grams of carbohydrates.

## 2021-11-21 NOTE — Progress Notes (Signed)
Name: Alexander Yang  MRN/ DOB: [id number], 03-May-1940   Age/ Sex: 81 y.o., male    PCP: Jinny Sanders, MD   Reason for Endocrinology Evaluation: Type 2 Diabetes Mellitus     Date of Initial Endocrinology Visit: 11/21/2021     PATIENT IDENTIFIER: [name] is a 81 y.o. male with a past medical history of DM, WFW syndrome,HTN, Hx of CVA, hx of prostate cancer . The patient presented for initial endocrinology clinic visit on 11/21/2021 for consultative assistance with his diabetes management.    HPI: [name] was    Diagnosed with DM years ago  Prior Medications tried/Intolerance: Wilder Glade - persistent hyperglycemia. Trulicity - severe weakness . Has been on metformin in the past -abdominal pain was attributed to stomach pains  Currently checking blood sugars multiple  x / day,  through freestyle libre   Hypoglycemia episodes : yes               Symptoms: no                 Frequency: 3/ month 65-69 on the CGM Hemoglobin A1c has ranged from 6.9% in 2018, peaking at 8.4% in 2022.   In terms of diet, the patient eats  3 meals a day.   Has chronic issues with GI in the form of stomach ache, has been seen by GI and allergist with minimal nausea for the past 2 years. This started on initiation of Lupron  ~ 2 yrs ago, he is now on Eligard but has worsened his pain .   He was seen by GI and tried on multiple PPI's, anatacids, antinausea, antispasmodics without side effects    HOME DIABETES REGIMEN: Glipizide 10 mg XL  Lantus 30 units daily    Statin: yes ACE-I/ARB: yes    METER DOWNLOAD SUMMARY: Did not bring    DIABETIC COMPLICATIONS: Microvascular complications:   Denies: CKD Last eye exam: Completed   Macrovascular complications:  Hx of CVA Denies: CAD, PVD   PAST HISTORY: Past Medical History:  Past Medical History:  Diagnosis Date   Cerebrovascular accident ([place])    Diabetes mellitus without complication ([place])    controlled with diet    Hypertension    [employer] [facility] (NI[facility]) Stroke Scale dysarthria score 1, 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 His smoking use included cigarettes. He has a 20.00 pack-year smoking history. He has never used smokeless tobacco. He reports current alcohol use of about 5.0 standard drinks of alcohol per week. He reports that he does not use drugs. Family History:  Family History  Problem Relation Age of Onset   Cerebral aneurysm Mother    Diabetes Father    Bradycardia Father    Benign prostatic hyperplasia Father    Supraventricular tachycardia Brother    Supraventricular tachycardia Brother    Cancer Cousin        maternal; unknown type, dx late 93s   Esophageal cancer Neg Hx    Stomach cancer Neg Hx    Rectal cancer Neg Hx  HOME MEDICATIONS: Allergies as of 11/21/2021       Reactions   Abiraterone Other (See Comments)   Liver enzymes outside of reference range        Medication List        Accurate as of November 21, 2021  4:01 PM. If you have any questions, ask your nurse or doctor.          Accu-Chek Aviva Plus test strip Generic drug: glucose blood USE TO CHECK BLOOD SUGAR 2 TIMES A DAY. DX: E11.49   Accu-Chek Aviva Plus w/Device Kit Use to check blood sugar 2 times a day.  Dx: E11.49   accu-chek soft touch lancets Use to check blood sugar 2 times a day.  Dx: E11.49   Accu-Chek Softclix Lancet Dev Kit Use to check blood sugar 2 times a day.  Dx: E11.49   amLODipine 5 MG tablet Commonly known as: NORVASC Take 1 tablet (5 mg total) by mouth daily.   atorvastatin 40 MG tablet Commonly  known as: LIPITOR TAKE 1 TABLET BY MOUTH ONCE DAILY   CALCIUM-VITAMIN D PO Take 2 tablets by mouth daily.   CINNAMON PO Take 1 capsule by mouth daily.   Continuous Glucose Monitor Sup Kit Check blood sugar continuously.  E11.49   darolutamide 300 MG tablet Commonly known as: NUBEQA Take 600 mg by mouth 2 (two) times daily with a meal.   ELIGARD [place] Inject 1 Dose into the skin every 6 (six) months.   FreeStyle Libre 2 Reader [place] 1 each by Does not apply route daily.   FreeStyle Libre 2 Sensor Misc 1 each by Does not apply route daily.   glipiZIDE 10 MG 24 hr tablet Commonly known as: Glucotrol XL Take 1 tablet (10 mg total) by mouth daily with breakfast.   ketoconazole 2 % cream Commonly known as: NIZORAL Apply 1 application topically daily as needed for irritation.   Lantus SoloStar 100 UNIT/ML Solostar Pen Generic drug: insulin glargine Inject 24 Units into the skin daily. What changed: how much to take Changed by: Dorita Sciara, MD   losartan 100 MG tablet Commonly known as: COZAAR TAKE 1 TABLET BY MOUTH ONCE DAILY   metoCLOPramide 10 MG tablet Commonly known as: REGLAN TAKE 1 TABLET BY MOUTH 4 TIMES DAILY BEFORE MEALS AND AT BEDTIME   Pen Needles 32G X 4 MM Misc 1 Device by Other route daily in the afternoon. Use to inject insulin daily What changed:  how much to take how to take this when to take this Changed by: Dorita Sciara, MD   promethazine 25 MG tablet Commonly known as: PHENERGAN Take 1 tablet (25 mg total) by mouth at bedtime.         ALLERGIES: Allergies  Allergen Reactions   Abiraterone Other (See Comments)    Liver enzymes outside of reference range     REVIEW OF SYSTEMS: A comprehensive ROS was conducted with the patient and is negative except as per HPI and below:  ROS    OBJECTIVE:   VITAL SIGNS: BP 120/80 (BP Location: Left Arm, Patient Position: Sitting, Cuff Size: Small)   Pulse 79   Ht 5' 10.25"  (1.784 m)   Wt 182 lb (82.6 kg)   SpO2 98%   BMI 25.93 kg/m    PHYSICAL EXAM:  General: Pt appears well and is in NAD  Neck: General: Supple without adenopathy or carotid bruits. Thyroid: Thyroid size normal.  No goiter or nodules appreciated.   Lungs:  Clear with good BS bilat with no rales, rhonchi, or wheezes  Heart: RRR   Extremities:  Lower extremities - No pretibial edema. No lesions.  Neuro: MS is good with appropriate affect, pt is alert and Ox3    DATA REVIEWED:  Lab Results  Component Value Date   HGBA1C 7.5 (A) 11/21/2021   HGBA1C 7.3 (H) 11/21/2021   HGBA1C 8.2 (A) 08/25/2021   Lab Results  Component Value Date   MICROALBUR 5.4 (H) 08/04/2013   LDLCALC 59 11/21/2021   CREATININE 0.90 11/21/2021   Lab Results  Component Value Date   MICRALBCREAT 2.2 08/04/2013    Lab Results  Component Value Date   CHOL 118 11/21/2021   HDL 44.30 11/21/2021   LDLCALC 59 11/21/2021   TRIG 74.0 11/21/2021   CHOLHDL 3 11/21/2021        Latest Reference Range & Units 10/20/21 15:21  Sodium 135 - 145 mmol/L 141  Potassium 3.5 - 5.1 mmol/L 4.0  Chloride 98 - 111 mmol/L 110  CO2 22 - 32 mmol/L 26  Glucose 70 - 99 mg/dL 123 (H)  BUN 8 - 23 mg/dL 21  Creatinine 0.61 - 1.24 mg/dL 0.81  Calcium 8.9 - 10.3 mg/dL 9.0  Anion gap 5 - 15  5  Alkaline Phosphatase 38 - 126 U/L 36 (L)  Albumin 3.5 - 5.0 g/dL 4.0  Lipase 11 - 51 U/L 32  AST 15 - 41 U/L 19  ALT 0 - 44 U/L 22  Total Protein 6.5 - 8.1 g/dL 7.0  Total Bilirubin 0.3 - 1.2 mg/dL 1.2  GFR, Estimated >60 mL/min >60      ASSESSMENT / PLAN / RECOMMENDATIONS:   1) Type 2 Diabetes Mellitus, OPtimally controlled, With macrovascular  complications - Most recent A1c of 7.5 %. Goal A1c < 7.5 %.    -His A1c is optimal at 7.5% given advanced age and metastatic prostate cancer, an A1c of less than 8.0% is acceptable -Patient has been using freestyle libre, he did not bring this today as he thought this appointment was for  abdominal pain -Patient tells me that he does have hypoglycemic episodes between 4-5 am, will reduce Lantus as below -I initially considered switching glipizide from XL to regular release due to high risk of hypoglycemia, but patient tells me that his BG's go to 250 mg/DL with breakfast and lunch, but remain <150 MG/DL after supper.  Based on all this information I will not change his glipizide at this time, but he was advised to take it 15-20 minutes before the first meal of the day -We did discuss reducing CHO intake with meals so that his BG's do not trend up to 250 mg/DL -He was on metformin at some point but this was discontinued due to GI symptoms, discontinuation did not help -Trulicity caused extreme weakness (question hypoglycemia) -Wilder Glade did not help with his glucose readings   MEDICATIONS: Decrease Lantus 24 units daily Continue glipizide 10 mg XL daily  EDUCATION / INSTRUCTIONS: BG monitoring instructions: Patient is instructed to check his blood sugars 3 times a day, before meals. Call [facility] if: BG persistently < 70  I reviewed the Rule of 15 for the treatment of hypoglycemia in detail with the patient. Literature supplied.   2) Diabetic complications:  Eye: Does not have known diabetic retinopathy.  Neuro/ Feet: Does not have known diabetic peripheral neuropathy. Renal: Patient does not have known baseline CKD. He is  on an ACEI/ARB at present.  3)  Chronic abdominal pain:  -Patient thought that his appointment today for his chronic abdominal pain.  This has been very distressing to him and he spent most of the visit discussing his epigastric pain that has been there for the past 2 years, the pain is worse when he gets up in the morning but eases off as the day goes on -He attributes the abdominal pain to Lupron and Eligard.  Patient is actually considering discontinuing this he has had poor quality of life with the pain.  He has been to multiple  specialists without help.  He has tried multiple medications without help  -I did assure the patient that glipizide no insulin will cause any abdominal pain .  And I would not start him on any medication at this time that would exacerbate any GI symptoms such as metformin or GLP-1 agonist        Signed electronically by: Mack Guise, MD  [facility]  [address] [place], [place] [address] Phone: [phone] FAX: [phone]   CC: Jinny Sanders, MD [place] [address] Phone: [phone]  Fax: [phone]    Return to Endocrinology clinic as below: Future Appointments  Date Time Provider [place]  11/23/2021  9:40 AM Jinny Sanders, MD [facility]  12/21/2021 11:00 AM Levin Erp, PA [facility]  05/24/2022 10:30 AM Jadiel Schmieder, Melanie Crazier, MD [facility] None

## 2021-11-21 NOTE — Telephone Encounter (Signed)
-----   Message from Ellamae Sia sent at 11/21/2021  8:14 AM EDT ----- Regarding: lab orders Lab orders for f/u appt

## 2021-11-21 NOTE — Progress Notes (Signed)
No critical labs need to be addressed urgently. We will discuss labs in detail at upcoming office visit.   

## 2021-11-22 MED ORDER — LANTUS SOLOSTAR 100 UNIT/ML ~~LOC~~ SOPN: 25.0000 [IU] | PEN_INJECTOR | Freq: Every day | SUBCUTANEOUS | 3 refills | Status: DC

## 2021-11-22 NOTE — Addendum Note (Signed)
Addended by: Dorita Sciara on: 11/22/2021 07:42 AM   Modules accepted: Orders

## 2021-11-23 ENCOUNTER — Ambulatory Visit: Payer: Medicare Other | Admitting: Family Medicine

## 2021-11-23 ENCOUNTER — Encounter: Payer: Self-pay | Admitting: Family Medicine

## 2021-11-23 VITALS — BP 120/60 | HR 65 | Temp 98.3°F | Ht 71.0 in | Wt 184.4 lb

## 2021-11-23 DIAGNOSIS — S80861A Insect bite (nonvenomous), right lower leg, initial encounter: Secondary | ICD-10-CM | POA: Diagnosis not present

## 2021-11-23 DIAGNOSIS — Z23 Encounter for immunization: Secondary | ICD-10-CM | POA: Diagnosis not present

## 2021-11-23 DIAGNOSIS — W57XXXA Bitten or stung by nonvenomous insect and other nonvenomous arthropods, initial encounter: Secondary | ICD-10-CM

## 2021-11-23 DIAGNOSIS — R109 Unspecified abdominal pain: Secondary | ICD-10-CM | POA: Diagnosis not present

## 2021-11-23 DIAGNOSIS — E1140 Type 2 diabetes mellitus with diabetic neuropathy, unspecified: Secondary | ICD-10-CM

## 2021-11-23 DIAGNOSIS — G8929 Other chronic pain: Secondary | ICD-10-CM

## 2021-11-23 NOTE — Assessment & Plan Note (Signed)
Acute, no associated infection.  Treat with cortisone 10 twice daily x1 to 2 weeks.

## 2021-11-23 NOTE — Assessment & Plan Note (Signed)
Chronic, improved control.  A1c at goal less than 8 Followed by endocrinology.  Recent decrease in Lantus given occ lows < 70.  He brings blood per sugar report to clinic today with multiple levels above 200 usually after meals.  Endocrinology did not recommend mealtime insulin. Discussed that he does not have to use continuous blood sugar monitor if he does not want to given frequent alarms.  I encouraged him to look into whether the alarm settings could be adjusted.

## 2021-11-23 NOTE — Progress Notes (Signed)
Patient ID: Alexander Yang, male    DOB: [birth year], 81 y.o.   MRN: [id number]  This visit was conducted in person.  BP 120/60   Pulse 65   Temp 98.3 F (36.8 C) (Oral)   Ht 5' 11"  (1.803 m)   Wt 184 lb 6 oz (83.6 kg)   SpO2 98%   BMI 25.72 kg/m    CC:  Chief Complaint  Patient presents with   Follow-up    Subjective:   HPI: Alexander Yang is a 81 y.o. male presenting on 11/23/2021 for Follow-up   He has now started seeing endocrinology for diabetes.  Reviewed office visit note from endocrinology from September 12.  A1c is significantly better and now at goal less than 8 Lab Results  Component Value Date   HGBA1C 7.5 (A) 11/21/2021   visual changes noted at last office visit October 26, 2021.   ER, MRI and Lab evaluation was unremarkable.  Referred to ophthalmology for eye exam.  Chronic abdominal discomfort and nausea: Reviewed office visit from Alexander Yang, [place] with [facility] from November 15, 2021... Diagnosed with functional dyspepsia she started FDguard 2 tabs 3 times daily.  She recommended consideration of amitriptyline 25 mg at night if FD guard does not help. Of note he did not tolerate SSRIs.   Today he reports continued nausea and abdominal discomfort with eating. He has gained some weight compared to last office visit in august 181 Lbs.    No longer taking Reglan.   He is planning on trying to stop Eliguard as it is possibly causing GI complaints... has appt in October with Uro. PSA down to 3  ( from 24)in last 3 months on darolutamide... does cause SE of fatigue. Wt Readings from Last 3 Encounters:  11/23/21 184 lb 6 oz (83.6 kg)  11/21/21 182 lb (82.6 kg)  11/15/21 182 lb (82.6 kg)   He is using continuous glucose monitor.  Medicare Wellness Visit with nurse, followed by fasting labs then  20 minute CPE with Dr. [name]  Relevant past medical, surgical, family and social history reviewed and updated as indicated. Interim medical history since  our last visit reviewed. Allergies and medications reviewed and updated. Outpatient Medications Prior to Visit  Medication Sig Dispense Refill   ACCU-CHEK AVIVA PLUS test strip USE TO CHECK BLOOD SUGAR 2 TIMES A DAY. DX: E11.49 100 strip 6   amLODipine (NORVASC) 5 MG tablet Take 1 tablet (5 mg total) by mouth daily. 90 tablet 3   atorvastatin (LIPITOR) 40 MG tablet TAKE 1 TABLET BY MOUTH ONCE DAILY 90 tablet 3   Blood Glucose Monitoring Suppl (ACCU-CHEK AVIVA PLUS) w/Device KIT Use to check blood sugar 2 times a day.  Dx: E11.49 1 kit 0   CALCIUM-VITAMIN D PO Take 2 tablets by mouth daily.     cholecalciferol (VITAMIN D3) 25 MCG (1000 UNIT) tablet Take 1,000 Units by mouth daily.     CINNAMON PO Take 1 capsule by mouth daily.     Continuous Blood Gluc Receiver (FREESTYLE LIBRE 2 READER) [place] 1 each by Does not apply route daily. 1 each 2   Continuous Blood Gluc Sensor (FREESTYLE LIBRE 2 SENSOR) MISC 1 each by Does not apply route daily. 2 each 3   Continuous Glucose Monitor Sup KIT Check blood sugar continuously.  E11.49 1 kit 0   cyanocobalamin (VITAMIN B12) 1000 MCG tablet Take 1,000 mcg by mouth daily.     darolutamide (NUBEQA) 300 MG tablet Take  600 mg by mouth 2 (two) times daily with a meal.     glipiZIDE (GLUCOTROL XL) 10 MG 24 hr tablet Take 1 tablet (10 mg total) by mouth daily with breakfast. 90 tablet 3   insulin glargine (LANTUS SOLOSTAR) 100 UNIT/ML Solostar Pen Inject 25 Units into the skin daily. 30 mL 3   Insulin Pen Needle (PEN NEEDLES) 32G X 4 MM MISC 1 Device by Other route daily in the afternoon. Use to inject insulin daily 100 each 3   ketoconazole (NIZORAL) 2 % cream Apply 1 application topically daily as needed for irritation.     Lancets (ACCU-CHEK SOFT TOUCH) lancets Use to check blood sugar 2 times a day.  Dx: E11.49 100 each 11   Lancets Misc. (ACCU-CHEK SOFTCLIX LANCET DEV) KIT Use to check blood sugar 2 times a day.  Dx: E11.49 1 kit 0   Leuprolide Acetate (ELIGARD  Opp) Inject 1 Dose into the skin every 6 (six) months.     losartan (COZAAR) 100 MG tablet TAKE 1 TABLET BY MOUTH ONCE DAILY 90 tablet 3   OVER THE COUNTER MEDICATION Regaloid-Level teaspoon mix with water daily or every other day     promethazine (PHENERGAN) 25 MG tablet Take 1 tablet (25 mg total) by mouth at bedtime. 10 tablet 0   metoCLOPramide (REGLAN) 10 MG tablet TAKE 1 TABLET BY MOUTH 4 TIMES DAILY BEFORE MEALS AND AT BEDTIME 120 tablet 0   No facility-administered medications prior to visit.     Per HPI unless specifically indicated in ROS section below Review of Systems  Constitutional:  Negative for fatigue and fever.  HENT:  Negative for ear pain.   Eyes:  Negative for pain.  Respiratory:  Negative for cough and shortness of breath.   Cardiovascular:  Negative for chest pain, palpitations and leg swelling.  Gastrointestinal:  Negative for abdominal pain.  Genitourinary:  Negative for dysuria.  Musculoskeletal:  Negative for arthralgias.  Neurological:  Negative for syncope, light-headedness and headaches.  Psychiatric/Behavioral:  Negative for dysphoric mood.    Objective:  BP 120/60   Pulse 65   Temp 98.3 F (36.8 C) (Oral)   Ht 5' 11"  (1.803 m)   Wt 184 lb 6 oz (83.6 kg)   SpO2 98%   BMI 25.72 kg/m   Wt Readings from Last 3 Encounters:  11/23/21 184 lb 6 oz (83.6 kg)  11/21/21 182 lb (82.6 kg)  11/15/21 182 lb (82.6 kg)      Physical Exam Constitutional:      Appearance: Normal appearance. He is well-developed.  HENT:     Head: Normocephalic.     Right Ear: Hearing normal.     Left Ear: Hearing normal.  Neck:     Thyroid: No thyroid mass or thyromegaly.     Vascular: No carotid bruit.     Trachea: Trachea normal.  Cardiovascular:     Rate and Rhythm: Normal rate and regular rhythm.     Pulses: Normal pulses.     Heart sounds: Heart sounds not distant. No murmur heard.    No friction rub. No gallop.     Comments: No peripheral edema Pulmonary:      Effort: Pulmonary effort is normal. No respiratory distress.     Breath sounds: Normal breath sounds.  Skin:    General: Skin is warm and dry.     Findings: Rash present.     Comments: Erythematous dollar coin size with central pustule right lower leg  Neurological:  Mental Status: He is alert.  Psychiatric:        Speech: Speech normal.        Behavior: Behavior normal.        Thought Content: Thought content normal.       Results for orders placed or performed in visit on 11/21/21  POCT glycosylated hemoglobin (Hb A1C)  Result Value Ref Range   Hemoglobin A1C 7.5 (A) 4.0 - 5.6 %   HbA1c POC (<> result, manual entry)     HbA1c, POC (prediabetic range)     HbA1c, POC (controlled diabetic range)    POCT Glucose (Device for Home Use)  Result Value Ref Range   Glucose Fasting, POC     POC Glucose 155 (A) 70 - 99 mg/dl     COVID 19 screen:  No recent travel or known exposure to COVID19 The patient denies respiratory symptoms of COVID 19 at this time. The importance of social distancing was discussed today.   Assessment and Plan Problem List Items Addressed This Visit     Diabetes mellitus with neurological manifestations, controlled ([place]) (Chronic)    Chronic, improved control.  A1c at goal less than 8 Followed by endocrinology.  Recent decrease in Lantus given occ lows < 70.  He brings blood per sugar report to clinic today with multiple levels above 200 usually after meals.  Endocrinology did not recommend mealtime insulin. Discussed that he does not have to use continuous blood sugar monitor if he does not want to given frequent alarms.  I encouraged him to look into whether the alarm settings could be adjusted.      Chronic abdominal pain    Chronic, felt to be secondary to functional dyspepsia versus side effect to Eligard.  He has not had any benefit yet from [place] but will continue it for another week.  He has come off Reglan given minimal improvement in symptoms  and high risk medication.  GI gave him Phenergan to use as needed at bedtime for nausea.   He is considering treatment for depression as possible cause of this issue.  Amitriptyline 25 mg p.o. nightly as suggested by GI would be a good option.  He did have side effects to Cymbalta in the past but a different SSRI such as Celexa may be a good option as well.       Insect bite of right lower leg    Acute, no associated infection.  Treat with cortisone 10 twice daily x1 to 2 weeks.      Other Visit Diagnoses     Need for influenza vaccination    -  Primary   Relevant Orders   Flu Vaccine QUAD High Dose(Fluad) (Completed)          Eliezer Lofts, MD

## 2021-11-23 NOTE — Assessment & Plan Note (Signed)
Chronic, felt to be secondary to functional dyspepsia versus side effect to Eligard.  He has not had any benefit yet from [place] but will continue it for another week.  He has come off Reglan given minimal improvement in symptoms and high risk medication.  GI gave him Phenergan to use as needed at bedtime for nausea.   He is considering treatment for depression as possible cause of this issue.  Amitriptyline 25 mg p.o. nightly as suggested by GI would be a good option.  He did have side effects to Cymbalta in the past but a different SSRI such as Celexa may be a good option as well.

## 2021-11-27 ENCOUNTER — Encounter: Payer: Self-pay | Admitting: Family Medicine

## 2021-11-28 ENCOUNTER — Encounter: Payer: Self-pay | Admitting: Family Medicine

## 2021-12-12 ENCOUNTER — Encounter: Payer: Self-pay | Admitting: Family Medicine

## 2021-12-15 ENCOUNTER — Other Ambulatory Visit: Payer: Medicare Other

## 2021-12-21 ENCOUNTER — Encounter: Payer: Self-pay | Admitting: Physician Assistant

## 2021-12-21 ENCOUNTER — Ambulatory Visit: Payer: Medicare Other | Admitting: Physician Assistant

## 2021-12-21 VITALS — BP 138/72 | HR 64 | Ht 71.0 in | Wt 184.1 lb

## 2021-12-21 DIAGNOSIS — R1013 Epigastric pain: Secondary | ICD-10-CM | POA: Diagnosis not present

## 2021-12-21 DIAGNOSIS — R110 Nausea: Secondary | ICD-10-CM | POA: Diagnosis not present

## 2021-12-21 NOTE — Patient Instructions (Signed)
_______________________________________________________  If you are age 81 or older, your body mass index should be between 23-30. Your Body mass index is 25.67 kg/m. If this is out of the aforementioned range listed, please consider follow up with your Primary Care Provider.  If you are age 36 or younger, your body mass index should be between 19-25. Your Body mass index is 25.67 kg/m. If this is out of the aformentioned range listed, please consider follow up with your Primary Care Provider.   ________________________________________________________  The [facility] providers would like to encourage you to use [facility] to communicate with providers for non-urgent requests or questions.  Due to long hold times on the telephone, sending your provider a message by [facility] may be a faster and more efficient way to get a response.  Please allow 48 business hours for a response.  Please remember that this is for non-urgent requests.  _______________________________________________________  Dennis Bast have been scheduled at [facility] on 12/29/2021.  Please arrive at 9:30am for registration. Please make certain not to have anything to eat or drink after midnight the night before your test. Hold all stomach medications (ex: Zofran, phenergan, Reglan) 48 hours prior to your test. If you need to reschedule your appointment, please contact radiology scheduling at [phone]. _____________________________________________________________________ A gastric-emptying study measures how long it takes for food to move through your stomach. There are several ways to measure stomach emptying. In the most common test, you eat food that contains a small amount of radioactive material. A scanner that detects the movement of the radioactive material is placed over your abdomen to monitor the rate at which food leaves your stomach. This test normally takes about 4 hours to  complete. _____________________________________________________________________

## 2021-12-21 NOTE — Progress Notes (Signed)
Chief Complaint: Nausea and epigastric pain  HPI:    [name] is an 80 year old Caucasian male with a past medical history as listed below including stage IV prostate cancer, known to Dr. Loletha Yang, who returns clinic today for follow-up of his nausea and epigastric pain.    02/13/2018 colonoscopy with examined portion of the ileum normal, increased mucosal vascular pattern in the distal rectum, one 3 mm hyperplastic polyp in the sigmoid colon and diverticulosis.  No repeat recommended due to age.     02/14/2021 patient seen in clinic by Alexander Savoy, NP and assigned to Dr. Loletha Yang.  At that time discussed daily ongoing nausea without vomiting and occasional bloating and mid upper abdominal discomfort.  At that time discussed an unrevealing CT scan in April and previously no improvement with Pepto-Bismol, Gas-X, Dicyclomine, Famotidine or Zofran.  At that time scheduled for an EGD.     03/31/2021 EGD with normal esophagus, erythematous mucosa in the prepyloric region of the stomach and otherwise normal.  Biopsies were normal.    10/20/2021 CT of the abdomen pelvis with contrast with nonobstructing left renal stones which were stable from prior study and mild diverticulosis.    10/20/2021 MRI of the brain without contrast showed no acute intracranial abnormality, small focus of cortical encephalomalacia at the peripheral right temporal lobe which could be related to prior skiing or trauma and mild age-related cerebral atrophy with chronic small vessel ischemic disease.    09/10/2021 patient contacted [place] and described that his stomach issues resemble gastroparesis in spite of passing the gastric emptying test and he wondered if he had functional dyspepsia which she read was similar to gastroparesis.  That time Dr. Loletha Yang discussed that he likely had functional nausea/dyspepsia.  Was recommended he try the Cymbalta.  Patient replied saying this made him very nauseous and dizzy for a week.    10/31/2021 patient  followed with an allergist to test for food allergies given that he continued with abdominal pain.  They did not think it was likely this was related to allergies but were testing him anyways.  Alpha gal testing was negative.  He was told he did not have a food allergy.    11/15/2021 see my detailed office visit note which outlines ongoing epigastric pain and nausea.  Again discussed high likelihood of this being functional given patient's stage IV prostate cancer diagnosis, but at that time did a trial of FDgard 2 tabs 3 times daily followed by a trial of Phenergan 25 mg nightly to see if any of this would help.    Today, patient tells me that his symptoms continue, again they wake him from his sleep at 6 or 7 in the morning and continue on for about 12 hours and then go away completely around 6:00 at night.  There was no help from FD guard or Phenergan trial.  His PCP just started him on Sertraline 12.5 mg daily and he has been taking this for the past week or so and does not feel any different.  They keep telling him "it is in your brain".  He tells me again that he really feels like it is related to gastroparesis, apparently when he did the last gastric emptying study he was on Reglan already which he did feel some benefit from initially.    Denies fever, chills, weight loss or blood in his stool.  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 DX: E11.49 100 strip 6   amLODipine (NORVASC) 5 MG tablet Take 1 tablet (5 mg total) by mouth daily. 90 tablet 3   atorvastatin (LIPITOR) 40 MG tablet TAKE 1 TABLET BY MOUTH ONCE DAILY 90 tablet 3   Blood Glucose Monitoring Suppl (ACCU-CHEK AVIVA PLUS) w/Device KIT Use to check blood sugar 2 times a day.  Dx: E11.49 1 kit 0   CALCIUM-VITAMIN D PO Take 2 tablets by mouth daily.     cholecalciferol (VITAMIN D3) 25 MCG (1000 UNIT) tablet Take 1,000 Units by mouth daily.     Continuous Blood Gluc Receiver (FREESTYLE LIBRE 2 READER) DEVI 1 each by Does not apply route daily. 1 each 2   Continuous Blood Gluc Sensor (FREESTYLE LIBRE 2 SENSOR) MISC 1 each by Does not apply route daily. 2 each 3   Continuous Glucose Monitor Sup KIT Check blood sugar continuously.  E11.49 1 kit 0   cyanocobalamin (VITAMIN B12) 1000 MCG tablet Take 1,000 mcg by mouth daily.     darolutamide (NUBEQA) 300 MG tablet Take 600 mg by mouth 2 (two) times daily with a meal.     glipiZIDE (GLUCOTROL XL) 10 MG 24 hr tablet Take 1 tablet (10 mg total) by mouth daily with breakfast. 90 tablet 3   insulin glargine (LANTUS SOLOSTAR) 100 UNIT/ML Solostar Pen Inject 25 Units into the skin daily. 30 mL 3   Insulin Pen Needle (PEN NEEDLES) 32G X 4 MM MISC 1 Device by Other route daily in the afternoon. Use to inject insulin daily 100 each 3   ketoconazole (NIZORAL) 2 % cream Apply 1 application topically daily as needed for irritation.     Lancets (ACCU-CHEK SOFT TOUCH) lancets Use to check blood sugar 2 times a day.  Dx: E11.49 100 each 11   Lancets Misc. (ACCU-CHEK SOFTCLIX LANCET DEV) KIT Use to check blood sugar 2 times a day.  Dx: E11.49 1 kit 0   Leuprolide Acetate (ELIGARD [place]) Inject 1 Dose into the skin every 6 (six) months.     losartan (COZAAR) 100 MG tablet TAKE 1 TABLET BY MOUTH ONCE DAILY 90 tablet 3   OVER THE COUNTER MEDICATION Regaloid-Level teaspoon mix  with water daily or every other day     promethazine (PHENERGAN) 25 MG tablet Take 1 tablet (25 mg total) by mouth at bedtime. 10 tablet 0   CINNAMON PO Take 1 capsule by mouth daily. (Patient not taking: Reported on 12/21/2021)     No current facility-administered medications for this visit.    Allergies as of 12/21/2021 - Review Complete 12/21/2021  Allergen Reaction Noted   Abiraterone Other (See Comments) 09/14/2021    Family History  Problem Relation Age of Onset   Cerebral aneurysm Mother    Diabetes Father    Bradycardia Father    Benign prostatic hyperplasia  Father    Supraventricular tachycardia Brother    Supraventricular tachycardia Brother    Cancer Cousin        maternal; unknown type, dx late 109s   Esophageal cancer Neg Hx    Stomach cancer Neg Hx    Rectal cancer Neg Hx     Social History   Socioeconomic History   Marital status: Married    Spouse name: Not on file   Number of children: 2   Years of education: Not on file   Highest education level: Not on file  Occupational History   Occupation: Retired  Tobacco Use   Smoking status: Former    Packs/day: 2.00    Years: 10.00    Total pack years: 20.00    Types: Cigarettes    Quit date: 03/12/1965    Years since quitting: 56.8   Smokeless tobacco: Never  Vaping Use   Vaping Use: Never used  Substance and Sexual Activity   Alcohol use: Yes    Alcohol/week: 5.0 standard drinks of alcohol    Types: 2 Glasses of wine, 3 Cans of beer per week   Drug use: No   Sexual activity: Never  Other Topics Concern   Not on file  Social History Narrative   Exercising: 3-4 times a week.   Healthy eating habits.   Full Code   Has a living will, has HCPOA: Physiological scientist (reviewed 2015)   Social Determinants of Health   Financial Resource Strain: Low Risk  (05/25/2020)   Overall Financial Resource Strain (CARDIA)    Difficulty of Paying Living Expenses: Not hard at all  Food Insecurity: No Food Insecurity  (05/25/2020)   Hunger Vital Sign    Worried About Running Out of Food in the Last Year: Never true    Ran Out of Food in the Last Year: Never true  Transportation Needs: No Transportation Needs (05/25/2020)   PRAPARE - Hydrologist (Medical): No    Lack of Transportation (Non-Medical): No  Physical Activity: Insufficiently Active (05/25/2020)   Exercise Vital Sign    Days of Exercise per Week: 2 days    Minutes of Exercise per Session: 60 min  Stress: No Stress Concern Present (05/25/2020)   [place]    Feeling of Stress : Not at all  Social Connections: Not on file  Intimate Partner Violence: Not At Risk (05/25/2020)   Humiliation, Afraid, Rape, and Kick questionnaire    Fear of Current or Ex-Partner: No    Emotionally Abused: No    Physically Abused: No    Sexually Abused: No    Review of Systems:    Constitutional: No weight loss, fever or chills Cardiovascular: No chest pain Respiratory: No SOB  Gastrointestinal: See HPI and otherwise negative   Physical Exam:  Vital signs: BP 138/72   Pulse 64   Ht $R'5\' 11"'Xc$  (1.803 m)   Wt 184 lb 1 oz (83.5 kg)   BMI 25.67 kg/m    Constitutional:   Pleasant Caucasian male appears to be in NAD, Well developed, Well nourished, alert and cooperative Respiratory: Respirations even and unlabored. Lungs clear to auscultation bilaterally.   No wheezes, crackles, or rhonchi.  Cardiovascular: Normal S1, S2. No MRG. Regular rate and rhythm. No peripheral edema, cyanosis or pallor.  Gastrointestinal:  Soft, nondistended, nontender. No rebound or guarding. Normal bowel sounds. No appreciable masses or hepatomegaly. Rectal:  Not performed.  Psychiatric: Oriented to  person, place and time. Demonstrates good judgement and reason without abnormal affect or behaviors.  RELEVANT LABS AND IMAGING: CBC    Component Value Date/Time   WBC 4.9 10/20/2021 1521    RBC 4.28 10/20/2021 1521   HGB 13.2 10/20/2021 1521   HCT 39.6 10/20/2021 1521   PLT 175 10/20/2021 1521   MCV 92.5 10/20/2021 1521   MCH 30.8 10/20/2021 1521   MCHC 33.3 10/20/2021 1521   RDW 12.2 10/20/2021 1521   LYMPHSABS 1.3 06/27/2020 1452   MONOABS 0.4 06/27/2020 1452   EOSABS 0.1 06/27/2020 1452   BASOSABS 0.0 06/27/2020 1452    CMP     Component Value Date/Time   NA 142 11/21/2021 1041   K 4.2 11/21/2021 1041   CL 107 11/21/2021 1041   CO2 28 11/21/2021 1041   GLUCOSE 129 (H) 11/21/2021 1041   BUN 26 (H) 11/21/2021 1041   CREATININE 0.90 11/21/2021 1041   CALCIUM 9.0 11/21/2021 1041   PROT 6.1 11/21/2021 1041   ALBUMIN 3.7 11/21/2021 1041   AST 16 11/21/2021 1041   ALT 19 11/21/2021 1041   ALKPHOS 33 (L) 11/21/2021 1041   BILITOT 1.0 11/21/2021 1041   GFRNONAA >60 10/20/2021 1521   GFRAA 124 12/26/2007 0848    Assessment: 1.  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  At this point we will repeat gastric emptying study with patient off of his Reglan which he has been off for the past couple of months.  Hopefully this will give [place] a better answer.  If this is normal then would recommend discontinuing Sertraline and trying Amitriptyline 25 mg p.o. nightly for functional dyspepsia. 2.  Discussed all the above with patient and he is in agreement. 3.  Patient given a gastroparesis diet handout today. 4.  Patient  to follow in clinic with me per recommendations after imaging above.  Ellouise Newer, PA-C [facility] 12/21/2021, 10:59 AM  Cc: Jinny Sanders, MD

## 2021-12-22 NOTE — Progress Notes (Signed)
____________________________________________________________  Attending physician addendum:  Thank you for sending this case to me. I have reviewed the entire note.  I believe this man has functional nausea and not gastroparesis.  Caution warranted in interpreting a gastric emptying study, particularly if it has mild delay in the first hour or 2, as that would not indicate gastroparesis.  He also had only brief response to metoclopramide in the past, suggesting possible placebo effect. While he is reluctant to accept this, I believe there may not be a a medicine related solution to this issue for him. I would also not recommend the use of amitriptyline in an elderly patient given the risk of anticholinergic side effects.  Wilfrid Lund, MD  ____________________________________________________________

## 2021-12-25 MED ORDER — PROMETHAZINE HCL 25 MG PO TABS: 25.0000 mg | ORAL_TABLET | Freq: Every day | ORAL | 11 refills | Status: DC

## 2021-12-27 ENCOUNTER — Encounter: Payer: Self-pay | Admitting: Family Medicine

## 2021-12-27 LAB — HM DIABETES EYE EXAM

## 2021-12-29 ENCOUNTER — Ambulatory Visit
Admission: RE | Admit: 2021-12-29 | Discharge: 2021-12-29 | Disposition: A | Discharge status: Home / Self Care | Payer: Medicare Other | Source: Ambulatory Visit | Attending: Physician Assistant | Admitting: Physician Assistant

## 2021-12-29 DIAGNOSIS — R1013 Epigastric pain: Secondary | ICD-10-CM | POA: Insufficient documentation

## 2021-12-29 DIAGNOSIS — R110 Nausea: Secondary | ICD-10-CM | POA: Insufficient documentation

## 2021-12-29 MED ORDER — TECHNETIUM TC 99M SULFUR COLLOID
2.1400 | Freq: Once | INTRAVENOUS | Status: AC | PRN
  Administered 2021-12-29: 2.14 via INTRAVENOUS

## 2022-01-02 MED ORDER — METOCLOPRAMIDE HCL 5 MG PO TABS: ORAL_TABLET | ORAL | 1 refills | Status: DC

## 2022-01-02 NOTE — Addendum Note (Signed)
Addended by: Yevette Edwards on: 01/02/2022 09:37 AM   Modules accepted: Orders

## 2022-01-02 NOTE — Telephone Encounter (Signed)
Reglan RX sent to [facility]. Patient notified via [place].

## 2022-01-10 ENCOUNTER — Encounter: Payer: Self-pay | Admitting: Family Medicine

## 2022-01-15 ENCOUNTER — Encounter: Payer: Self-pay | Admitting: Family Medicine

## 2022-02-02 ENCOUNTER — Encounter: Payer: Self-pay | Admitting: Family Medicine

## 2022-02-08 ENCOUNTER — Ambulatory Visit: Payer: Medicare Other | Admitting: Allergy & Immunology

## 2022-03-06 ENCOUNTER — Other Ambulatory Visit: Payer: Self-pay | Admitting: Family Medicine

## 2022-03-12 ENCOUNTER — Encounter: Payer: Self-pay | Admitting: Family Medicine

## 2022-03-15 ENCOUNTER — Encounter: Payer: Self-pay | Admitting: Family Medicine

## 2022-03-15 ENCOUNTER — Ambulatory Visit: Payer: Medicare Other | Admitting: Family Medicine

## 2022-03-15 VITALS — BP 122/78 | HR 82 | Temp 96.5°F | Resp 16 | Ht 71.0 in | Wt 191.0 lb

## 2022-03-15 DIAGNOSIS — H6123 Impacted cerumen, bilateral: Secondary | ICD-10-CM | POA: Diagnosis not present

## 2022-03-15 NOTE — Progress Notes (Signed)
Patient ID: Alexander Yang, male    DOB: Jul 02, 1940, 82 y.o.   MRN: [id number]  This visit was conducted in person.  BP 122/78   Pulse 82   Temp (!) 96.5 F (35.8 C) (Temporal)   Resp 16   Ht _0  (1.803 m)   Wt 191 lb (86.6 kg)   SpO2 97%   BMI 26.64 kg/m    CC:  Chief Complaint  Patient presents with   Ear Fullness    Left ear 2-3 weeks     Subjective:   HPI: Alexander Yang is a 82 y.o. male presenting on 03/15/2022 for Ear Fullness (Left ear 2-3 weeks )  He has noted pressure and pain in left ear ongoing for 2-3 weeks. Left era fullness.  Pain is worse with lying down but not severe. No Nasal congestion, no ST.  No fever. No SOB.     No change in hearing.   No Q-tips. Has tried ear drop for wax... that he tried.. minimally helpful.      Relevant past medical, surgical, family and social history reviewed and updated as indicated. Interim medical history since our last visit reviewed. Allergies and medications reviewed and updated. Outpatient Medications Prior to Visit  Medication Sig Dispense Refill   ACCU-CHEK AVIVA PLUS test strip USE TO CHECK BLOOD SUGAR 2 TIMES A DAY. DX: E11.49 100 strip 6   amLODipine (NORVASC) 5 MG tablet Take 1 tablet (5 mg total) by mouth daily. 90 tablet 3   atorvastatin (LIPITOR) 40 MG tablet TAKE 1 TABLET BY MOUTH ONCE DAILY 90 tablet 3   Blood Glucose Monitoring Suppl (ACCU-CHEK AVIVA PLUS) w/Device KIT Use to check blood sugar 2 times a day.  Dx: E11.49 1 kit 0   CALCIUM-VITAMIN D PO Take 2 tablets by mouth daily.     cholecalciferol (VITAMIN D3) 25 MCG (1000 UNIT) tablet Take 1,000 Units by mouth daily.     Continuous Blood Gluc Receiver (FREESTYLE LIBRE 2 READER) DEVI 1 each by Does not apply route daily. 1 each 2   Continuous Blood Gluc Sensor (FREESTYLE LIBRE 2 SENSOR) MISC 1 each by Does not apply route daily. 2 each 3   Continuous Glucose Monitor Sup KIT Check blood sugar continuously.  E11.49 1 kit 0   cyanocobalamin  (VITAMIN B12) 1000 MCG tablet Take 1,000 mcg by mouth daily.     darolutamide (NUBEQA) 300 MG tablet Take 600 mg by mouth 2 (two) times daily with a meal.     glipiZIDE (GLUCOTROL XL) 10 MG 24 hr tablet Take 1 tablet (10 mg total) by mouth daily with breakfast. 90 tablet 3   insulin glargine (LANTUS SOLOSTAR) 100 UNIT/ML Solostar Pen Inject 25 Units into the skin daily. 30 mL 3   Insulin Pen Needle (PEN NEEDLES) 32G X 4 MM MISC 1 Device by Other route daily in the afternoon. Use to inject insulin daily 100 each 3   ketoconazole (NIZORAL) 2 % cream Apply 1 application topically daily as needed for irritation.     Lancets (ACCU-CHEK SOFT TOUCH) lancets Use to check blood sugar 2 times a day.  Dx: E11.49 100 each 11   Lancets Misc. (ACCU-CHEK SOFTCLIX LANCET DEV) KIT Use to check blood sugar 2 times a day.  Dx: E11.49 1 kit 0   Leuprolide Acetate (ELIGARD [place]) Inject 1 Dose into the skin every 3 (three) months.     losartan (COZAAR) 100 MG tablet TAKE 1 TABLET BY MOUTH ONCE DAILY 90  tablet 3   OVER THE COUNTER MEDICATION Regaloid-Level teaspoon mix with water daily or every other day     promethazine (PHENERGAN) 25 MG tablet Take 1 tablet (25 mg total) by mouth at bedtime. 30 tablet 11   CINNAMON PO Take 1 capsule by mouth daily. (Patient not taking: Reported on 03/15/2022)     metoCLOPramide (REGLAN) 5 MG tablet Take 1 tablet (5 mg total) by mouth 3 (three) times daily 20 minutes before meals. (Patient not taking: Reported on 03/15/2022) 90 tablet 1   No facility-administered medications prior to visit.     Per HPI unless specifically indicated in ROS section below Review of Systems  Constitutional:  Negative for fatigue and fever.  HENT:  Positive for ear pain.   Eyes:  Negative for pain.  Respiratory:  Negative for cough and shortness of breath.   Cardiovascular:  Negative for chest pain, palpitations and leg swelling.  Gastrointestinal:  Negative for abdominal pain.  Genitourinary:  Negative  for dysuria.  Musculoskeletal:  Negative for arthralgias.  Neurological:  Negative for syncope, light-headedness and headaches.  Psychiatric/Behavioral:  Negative for dysphoric mood.    Objective:  BP 122/78   Pulse 82   Temp (!) 96.5 F (35.8 C) (Temporal)   Resp 16   Ht _0  (1.803 m)   Wt 191 lb (86.6 kg)   SpO2 97%   BMI 26.64 kg/m   Wt Readings from Last 3 Encounters:  03/15/22 191 lb (86.6 kg)  12/21/21 184 lb 1 oz (83.5 kg)  11/23/21 184 lb 6 oz (83.6 kg)      Physical Exam Constitutional:      Appearance: He is well-developed.  HENT:     Head: Normocephalic.     Right Ear: Hearing normal. There is impacted cerumen.     Left Ear: Hearing normal. There is impacted cerumen.     Nose: Nose normal.  Neck:     Thyroid: No thyroid mass or thyromegaly.     Vascular: No carotid bruit.     Trachea: Trachea normal.  Cardiovascular:     Rate and Rhythm: Normal rate and regular rhythm.     Pulses: Normal pulses.     Heart sounds: Heart sounds not distant. No murmur heard.    No friction rub. No gallop.     Comments: No peripheral edema Pulmonary:     Effort: Pulmonary effort is normal. No respiratory distress.     Breath sounds: Normal breath sounds.  Skin:    General: Skin is warm and dry.     Findings: No rash.  Psychiatric:        Speech: Speech normal.        Behavior: Behavior normal.        Thought Content: Thought content normal.       Results for orders placed or performed in visit on 12/27/21  HM DIABETES EYE EXAM  Result Value Ref Range   HM Diabetic Eye Exam No Retinopathy No Retinopathy     COVID 19 screen:  No recent travel or known exposure to [place] The patient denies respiratory symptoms of COVID 19 at this time. The importance of social distancing was discussed today.   Assessment and Plan  Cerumen impaction removal via irrigation Performed by :  [place] Consent for procedure obtained verbally. Bilateral ears lavaged/  irrigated with warm water gently. Pt tolerated procedure well, with no complications. After procedure ears clear of cerumen impaction, with minimal ear canal irritation  and redness, no bleeding. TM intact and symptoms improved.    Eliezer Lofts, MD

## 2022-04-19 ENCOUNTER — Encounter: Payer: Self-pay | Admitting: Internal Medicine

## 2022-04-19 ENCOUNTER — Encounter: Payer: Self-pay | Admitting: Family Medicine

## 2022-05-07 ENCOUNTER — Telehealth: Payer: Self-pay | Admitting: Family Medicine

## 2022-05-07 MED ORDER — FREESTYLE LIBRE 2 SENSOR MISC: 1.0000 | Freq: Every day | 11 refills | Status: DC

## 2022-05-07 NOTE — Telephone Encounter (Signed)
Refill for [employer] 2 Sensors faxed to [facility] at [phone]

## 2022-05-07 NOTE — Telephone Encounter (Signed)
Prescription Request  05/07/2022  Is this a "Controlled Substance" medicine? No  LOV: 03/15/2022  What is the name of the medication or equipment? Continuous Blood Gluc Sensor (FREESTYLE LIBRE 2 SENSOR) MISC   Have you contacted your pharmacy to request a refill? Yes  Pharmacy is requesting [name] to call in verbally to refill meds @ [phone] Which pharmacy would you like this sent to?    [facility], [address] [place] [address] Phone: [phone] Fax: [phone]      Patient notified that their request is being sent to the clinical staff for review and that they should receive a response within 2 business days.   Please advise at Mobile [phone] (mobile)

## 2022-05-21 ENCOUNTER — Encounter: Payer: Self-pay | Admitting: Family Medicine

## 2022-05-24 ENCOUNTER — Encounter: Payer: Self-pay | Admitting: Internal Medicine

## 2022-05-24 ENCOUNTER — Encounter: Payer: Self-pay | Admitting: Family Medicine

## 2022-05-24 ENCOUNTER — Ambulatory Visit: Payer: Medicare Other | Admitting: Internal Medicine

## 2022-05-24 VITALS — BP 130/80 | HR 81 | Ht 71.0 in | Wt 196.0 lb

## 2022-05-24 DIAGNOSIS — Z794 Long term (current) use of insulin: Secondary | ICD-10-CM | POA: Diagnosis not present

## 2022-05-24 DIAGNOSIS — E1159 Type 2 diabetes mellitus with other circulatory complications: Secondary | ICD-10-CM

## 2022-05-24 LAB — POCT GLYCOSYLATED HEMOGLOBIN (HGB A1C): Hemoglobin A1C: 7.4 % — AB (ref 4.0–5.6)

## 2022-05-24 MED ORDER — GLIPIZIDE ER 10 MG PO TB24: 10.0000 mg | ORAL_TABLET | Freq: Every day | ORAL | 3 refills | Status: DC

## 2022-05-24 MED ORDER — PEN NEEDLES 32G X 4 MM MISC: 1.0000 | Freq: Every day | 3 refills | Status: AC

## 2022-05-24 MED ORDER — LANTUS SOLOSTAR 100 UNIT/ML ~~LOC~~ SOPN: 20.0000 [IU] | PEN_INJECTOR | Freq: Every day | SUBCUTANEOUS | 3 refills | Status: AC

## 2022-05-24 NOTE — Progress Notes (Signed)
Name: Alexander Yang  MRN/ DOB: [id number], February 06, 1941   Age/ Sex: 82 y.o., male    PCP: Jinny Sanders, MD   Reason for Endocrinology Evaluation: Type 2 Diabetes Mellitus     Date of Initial Endocrinology Visit: 11/21/2021    PATIENT IDENTIFIER: Alexander Yang is a 82 y.o. male with a past medical history of DM, WFW syndrome,HTN, Hx of CVA, hx of prostate cancer . The patient presented for initial endocrinology clinic visit on 11/21/2021  for consultative assistance with his diabetes management.    HPI: Alexander Yang was    Diagnosed with DM years ago  Prior Medications tried/Intolerance: Wilder Glade - persistent hyperglycemia. Trulicity - severe weakness . Has been on metformin in the past -abdominal pain was attributed to stomach pains  Currently checking blood sugars multiple  x / day,  through freestyle libre   Hypoglycemia episodes : yes               Symptoms: no                 Frequency: 3/ month 65-69 on the CGM Hemoglobin A1c has ranged from 6.9% in 2018, peaking at 8.4% in 2022.   In terms of diet, the patient eats  3 meals a day.   Has chronic issues with GI in the form of stomach ache, has been seen by GI and allergist with minimal nausea for the past 2 years. This started on initiation of Lupron  ~ 2 yrs ago, he is now on Eligard but has worsened his pain .   He was seen by GI and tried on multiple PPI's, anatacids, antinausea, antispasmodics without side effects    SUBJECTIVE:   During the last visit (11/21/2021): A1c 7.5%   Today (05/24/22): Alexander Yang is here for a follow up on diabetes management. He  checks his blood sugars multiple  times daily. The patient has  had hypoglycemic episodes since the last clinic visit, which typically occur 1 x / week most often occuring in the morning. The patient is  symptomatic with these episodes.   He continues with stomach issues  Denies  nausea or vomiting  Denies constipation or diarrhea    HOME DIABETES  REGIMEN: Glipizide 10 mg XL daily  Lantus 24 units daily    Statin: yes ACE-I/ARB: yes    CONTINUOUS GLUCOSE MONITORING RECORD INTERPRETATION    Dates of Recording: 3/1-3/14/2024  Sensor description:freestyle libre  Results statistics:   CGM use % of time 74  Average and SD 166/29.6  Time in range    63    %  % Time Above 180 30  % Time above 250 6  % Time Below target 1   Glycemic patterns summary: Hypoglycemia noted during the night with hyperglycemia during the day   Hyperglycemic episodes  postprandial   Hypoglycemic episodes occurred at night and before supper  Overnight periods: trends down    DIABETIC COMPLICATIONS: Microvascular complications:   Denies: CKD Last eye exam: Completed   Macrovascular complications:  Hx of CVA Denies: CAD, PVD   PAST HISTORY: Past Medical History:  Past Medical History:  Diagnosis Date   Cerebrovascular accident ([place])    Diabetes mellitus without complication ([place])    controlled with diet   Hypertension    [employer] [facility] ([facility]) Stroke Scale dysarthria score 1, 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 His smoking use included cigarettes. He has a 20.00 pack-year smoking history. He has never used smokeless tobacco. He reports current alcohol use of about 5.0 standard drinks of alcohol per week. He reports that he does not use drugs. Family History:  Family History  Problem Relation Age of Onset   Cerebral aneurysm Mother    Diabetes Father    Bradycardia Father    Benign prostatic hyperplasia Father    Supraventricular tachycardia Brother     Prostate cancer Brother    Supraventricular tachycardia Brother    Cancer Cousin        maternal; unknown type, dx late 44s   Esophageal cancer Neg Hx    Stomach cancer Neg Hx    Rectal cancer Neg Hx      HOME MEDICATIONS: Allergies as of 05/24/2022       Reactions   Abiraterone Other (See Comments)   Liver enzymes outside of reference range        Medication List        Accurate as of May 24, 2022 10:36 AM. If you have any questions, ask your nurse or doctor.          STOP taking these medications    CINNAMON PO Stopped by: Dorita Sciara, MD   promethazine 25 MG tablet Commonly known as: PHENERGAN Stopped by: Dorita Sciara, MD       TAKE these medications    Accu-Chek Aviva Plus test strip Generic drug: glucose blood USE TO CHECK BLOOD SUGAR 2 TIMES A DAY. DX: E11.49   Accu-Chek Aviva Plus w/Device Kit Use to check blood sugar 2 times a day.  Dx: E11.49   accu-chek soft touch lancets Use to check blood sugar 2 times a day.  Dx: E11.49   Accu-Chek Softclix Lancet Dev Kit Use to check blood sugar 2 times a day.  Dx: E11.49   amLODipine 5 MG tablet Commonly known as: NORVASC Take 1 tablet (5 mg total) by mouth daily.   atorvastatin 40 MG tablet Commonly known as: LIPITOR TAKE 1 TABLET BY MOUTH ONCE DAILY   CALCIUM-VITAMIN D PO Take 2 tablets by mouth daily.   cholecalciferol 25 MCG (1000 UNIT) tablet Commonly known as: VITAMIN D3 Take 1,000 Units by mouth daily.   Continuous Glucose Monitor Sup Kit Check blood sugar continuously.  E11.49   cyanocobalamin 1000 MCG tablet Commonly known as: VITAMIN B12 Take 1,000 mcg by mouth daily.   darolutamide 300 MG tablet Commonly known as: NUBEQA Take 600 mg by mouth 2 (two) times daily with a meal.   ELIGARD [place] Inject 1 Dose into the skin every 3 (three) months.   FreeStyle Libre 2 Reader [place] 1 each by Does not apply route daily.   FreeStyle Libre 2 Sensor Misc 1 each by  Does not apply route daily.   glipiZIDE 10 MG 24 hr tablet Commonly known as: Glucotrol XL Take 1 tablet (10 mg total) by mouth daily with breakfast.   ketoconazole 2 % cream Commonly known as: NIZORAL Apply 1 application topically daily as needed for irritation.   Lantus SoloStar 100 UNIT/ML Solostar Pen Generic drug: insulin glargine Inject 25 Units into  the skin daily.   losartan 100 MG tablet Commonly known as: COZAAR TAKE 1 TABLET BY MOUTH ONCE DAILY   metoCLOPramide 5 MG tablet Commonly known as: REGLAN Take 1 tablet (5 mg total) by mouth 3 (three) times daily 20 minutes before meals.   OVER THE COUNTER MEDICATION Regaloid-Level teaspoon mix with water daily or every other day   Pen Needles 32G X 4 MM Misc 1 Device by Other route daily in the afternoon. Use to inject insulin daily         ALLERGIES: Allergies  Allergen Reactions   Abiraterone Other (See Comments)    Liver enzymes outside of reference range     REVIEW OF SYSTEMS: A comprehensive ROS was conducted with the patient and is negative except as per HPI     OBJECTIVE:   VITAL SIGNS: BP 130/80 (BP Location: Left Arm, Patient Position: Sitting, Cuff Size: Large)   Pulse 81   Ht '5\' 11"'$  (1.803 m)   Wt 196 lb (88.9 kg)   SpO2 98%   BMI 27.34 kg/m    PHYSICAL EXAM:  General: Pt appears well and is in NAD  Neck: General: Supple without adenopathy or carotid bruits. Thyroid: Thyroid size normal.  No goiter or nodules appreciated.   Lungs: Clear with good BS bilat with no rales, rhonchi, or wheezes  Heart: RRR   Extremities:  Lower extremities - No pretibial edema. No lesions.  Neuro: MS is good with appropriate affect, pt is alert and Ox3    DM foot exam 05/24/2022 The skin of the feet is intact without sores or ulcerations. The pedal pulses are 2+ on right and 2+ on left. The sensation is intact to a screening 5.07, 10 gram monofilament bilaterally   DATA REVIEWED:  Lab Results   Component Value Date   HGBA1C 7.4 (A) 05/24/2022   HGBA1C 7.5 (A) 11/21/2021   HGBA1C 7.3 (H) 11/21/2021   Lab Results  Component Value Date   MICROALBUR 5.4 (H) 08/04/2013   LDLCALC 59 11/21/2021   CREATININE 0.90 11/21/2021   Lab Results  Component Value Date   MICRALBCREAT 2.2 08/04/2013    Lab Results  Component Value Date   CHOL 118 11/21/2021   HDL 44.30 11/21/2021   LDLCALC 59 11/21/2021   TRIG 74.0 11/21/2021   CHOLHDL 3 11/21/2021        Latest Reference Range & Units 10/20/21 15:21  Sodium 135 - 145 mmol/L 141  Potassium 3.5 - 5.1 mmol/L 4.0  Chloride 98 - 111 mmol/L 110  CO2 22 - 32 mmol/L 26  Glucose 70 - 99 mg/dL 123 (H)  BUN 8 - 23 mg/dL 21  Creatinine 0.61 - 1.24 mg/dL 0.81  Calcium 8.9 - 10.3 mg/dL 9.0  Anion gap 5 - 15  5  Alkaline Phosphatase 38 - 126 U/L 36 (L)  Albumin 3.5 - 5.0 g/dL 4.0  Lipase 11 - 51 U/L 32  AST 15 - 41 U/L 19  ALT 0 - 44 U/L 22  Total Protein 6.5 - 8.1 g/dL 7.0  Total Bilirubin 0.3 - 1.2 mg/dL 1.2  GFR, Estimated >60 mL/min >60      ASSESSMENT / PLAN / RECOMMENDATIONS:   1) Type 2 Diabetes Mellitus, OPtimally controlled, With macrovascular  complications - Most recent A1c of 7.4 %. Goal A1c < 7.5 %.    -His A1c is optimal at 7.4% given advanced age and metastatic prostate cancer, an A1c of less than 8.0% is acceptable - He continues  with hypoglycemia , his hypoglycemia is fasting which is attributed to basal insulin  - He understands the increased risk of hypoglycemia  with combination of insulin and SU -He was on metformin at some point but this was discontinued due to GI symptoms, discontinuation did not help improve his abdomen metformin  -Trulicity caused extreme weakness (question hypoglycemia) -Wilder Glade did not help with his glucose readings    MEDICATIONS: Decrease Lantus 20 units daily Continue glipizide 10 mg XL daily  EDUCATION / INSTRUCTIONS: BG monitoring instructions: Patient is instructed to check  his blood sugars 3 times a day, before meals. Call [facility] if: BG persistently < 70  I reviewed the Rule of 15 for the treatment of hypoglycemia in detail with the patient. Literature supplied.   2) Diabetic complications:  Eye: Does not have known diabetic retinopathy.  Neuro/ Feet: Does not have known diabetic peripheral neuropathy. Renal: Patient does not have known baseline CKD. He is  on an ACEI/ARB at present.    F/U in 6 months    Signed electronically by: Mack Guise, MD  [facility]  [facility] [address] [place], [place] [address] Phone: [phone] FAX: [phone]   CC: Jinny Sanders, MD [place] [address] Phone: [phone]  Fax: [phone]    Return to Endocrinology clinic as below: Future Appointments  Date Time Provider [place]  05/31/2022 10:30 AM [facility] ANNUAL WELLNESS VISIT 1 [facility]  06/14/2022  9:00 AM Jinny Sanders, MD [facility]

## 2022-05-24 NOTE — Patient Instructions (Addendum)
Decrease Lantus 20 units daily  Continue Glipizide 10 mg XL, 1 tablet before Breakfast     HOW TO TREAT LOW BLOOD SUGARS (Blood sugar LESS THAN 70 MG/DL) Please follow the RULE OF 15 for the treatment of hypoglycemia treatment (when your (blood sugars are less than 70 mg/dL)   STEP 1: Take 15 grams of carbohydrates when your blood sugar is low, which includes:  3-4 GLUCOSE TABS  OR 3-4 OZ OF JUICE OR REGULAR SODA OR ONE TUBE OF GLUCOSE GEL    STEP 2: RECHECK blood sugar in 15 MINUTES STEP 3: If your blood sugar is still low at the 15 minute recheck --> then, go back to STEP 1 and treat AGAIN with another 15 grams of carbohydrates.

## 2022-05-25 ENCOUNTER — Encounter: Payer: Self-pay | Admitting: Internal Medicine

## 2022-05-31 ENCOUNTER — Ambulatory Visit (INDEPENDENT_AMBULATORY_CARE_PROVIDER_SITE_OTHER): Payer: Medicare Other

## 2022-05-31 ENCOUNTER — Telehealth: Payer: Self-pay | Admitting: Family Medicine

## 2022-05-31 VITALS — Ht 71.0 in | Wt 195.0 lb

## 2022-05-31 DIAGNOSIS — Z0000 Encounter for general adult medical examination without abnormal findings: Secondary | ICD-10-CM | POA: Diagnosis not present

## 2022-05-31 DIAGNOSIS — E1140 Type 2 diabetes mellitus with diabetic neuropathy, unspecified: Secondary | ICD-10-CM

## 2022-05-31 NOTE — Telephone Encounter (Signed)
-----   Message from Ellamae Sia sent at 05/31/2022 10:31 AM EDT ----- Regarding: Lab orders for Thursday, 3.28.24 Patient is scheduled for CPX labs, please order future labs, Thanks , Karna Christmas

## 2022-05-31 NOTE — Patient Instructions (Signed)
Alexander Yang , Thank you for taking time to come for your Medicare Wellness Visit. I appreciate your ongoing commitment to your health goals. Please review the following plan we discussed and let me know if I can assist you in the future.   These are the goals we discussed:  Goals      Increase physical activity     Starting 03/28/2018, I will continue to exercise for at least 60 minutes 4 days per week.      Patient Stated     03/30/2019, I will start back exercising lifting weights and running on treadmill.      Patient Stated     05/25/2020, I will continue to go to the gym 2 days a week for 1 hour.         This is a list of the screening recommended for you and due dates:  Health Maintenance  Topic Date Due   Yearly kidney health urinalysis for diabetes  08/05/2014   DTaP/Tdap/Td vaccine (2 - Tdap) 03/25/2017   COVID-19 Vaccine (7 - 2023-24 season) 11/10/2021   Yearly kidney function blood test for diabetes  11/22/2022   Hemoglobin A1C  11/24/2022   Eye exam for diabetics  12/28/2022   Complete foot exam   05/24/2023   Medicare Annual Wellness Visit  05/31/2023   Pneumonia Vaccine  Completed   Flu Shot  Completed   Zoster (Shingles) Vaccine  Completed   HPV Vaccine  Aged Out    Advanced directives: Please bring a copy of your health care power of attorney and living will to the office to be added to your chart at your convenience.   Conditions/risks identified: none  Next appointment: Follow up in one year for your annual wellness visit. 06/05/23 @ 10:30 telephone visit.  Preventive Care 65 Years and Older, Male  Preventive care refers to lifestyle choices and visits with your health care provider that can promote health and wellness. What does preventive care include? A yearly physical exam. This is also called an annual well check. Dental exams once or twice a year. Routine eye exams. Ask your health care provider how often you should have your eyes  checked. Personal lifestyle choices, including: Daily care of your teeth and gums. Regular physical activity. Eating a healthy diet. Avoiding tobacco and drug use. Limiting alcohol use. Practicing safe sex. Taking low doses of aspirin every day. Taking vitamin and mineral supplements as recommended by your health care provider. What happens during an annual well check? The services and screenings done by your health care provider during your annual well check will depend on your age, overall health, lifestyle risk factors, and family history of disease. Counseling  Your health care provider may ask you questions about your: Alcohol use. Tobacco use. Drug use. Emotional well-being. Home and relationship well-being. Sexual activity. Eating habits. History of falls. Memory and ability to understand (cognition). Work and work Statistician. Screening  You may have the following tests or measurements: Height, weight, and BMI. Blood pressure. Lipid and cholesterol levels. These may be checked every 5 years, or more frequently if you are over 84 years old. Skin check. Lung cancer screening. You may have this screening every year starting at age 82 if you have a 30-pack-year history of smoking and currently smoke or have quit within the past 15 years. Fecal occult blood test (FOBT) of the stool. You may have this test every year starting at age 43. Flexible sigmoidoscopy or colonoscopy. You may have  a sigmoidoscopy every 5 years or a colonoscopy every 10 years starting at age 58. Prostate cancer screening. Recommendations will vary depending on your family history and other risks. Hepatitis C blood test. Hepatitis B blood test. Sexually transmitted disease (STD) testing. Diabetes screening. This is done by checking your blood sugar (glucose) after you have not eaten for a while (fasting). You may have this done every 1-3 years. Abdominal aortic aneurysm (AAA) screening. You may need this  if you are a current or former smoker. Osteoporosis. You may be screened starting at age 80 if you are at high risk. Talk with your health care provider about your test results, treatment options, and if necessary, the need for more tests. Vaccines  Your health care provider may recommend certain vaccines, such as: Influenza vaccine. This is recommended every year. Tetanus, diphtheria, and acellular pertussis (Tdap, Td) vaccine. You may need a Td booster every 10 years. Zoster vaccine. You may need this after age 40. Pneumococcal 13-valent conjugate (PCV13) vaccine. One dose is recommended after age 60. Pneumococcal polysaccharide (PPSV23) vaccine. One dose is recommended after age 74. Talk to your health care provider about which screenings and vaccines you need and how often you need them. This information is not intended to replace advice given to you by your health care provider. Make sure you discuss any questions you have with your health care provider. Document Released: 03/25/2015 Document Revised: 11/16/2015 Document Reviewed: 12/28/2014 Elsevier Interactive Patient Education  2017 [place] Prevention in the Home Falls can cause injuries. They can happen to people of all ages. There are many things you can do to make your home safe and to help prevent falls. What can I do on the outside of my home? Regularly fix the edges of walkways and driveways and fix any cracks. Remove anything that might make you trip as you walk through a door, such as a raised step or threshold. Trim any bushes or trees on the path to your home. Use bright outdoor lighting. Clear any walking paths of anything that might make someone trip, such as rocks or tools. Regularly check to see if handrails are loose or broken. Make sure that both sides of any steps have handrails. Any raised decks and porches should have guardrails on the edges. Have any leaves, snow, or ice cleared regularly. Use sand  or salt on walking paths during winter. Clean up any spills in your garage right away. This includes oil or grease spills. What can I do in the bathroom? Use night lights. Install grab bars by the toilet and in the tub and shower. Do not use towel bars as grab bars. Use non-skid mats or decals in the tub or shower. If you need to sit down in the shower, use a plastic, non-slip stool. Keep the floor dry. Clean up any water that spills on the floor as soon as it happens. Remove soap buildup in the tub or shower regularly. Attach bath mats securely with double-sided non-slip rug tape. Do not have throw rugs and other things on the floor that can make you trip. What can I do in the bedroom? Use night lights. Make sure that you have a light by your bed that is easy to reach. Do not use any sheets or blankets that are too big for your bed. They should not hang down onto the floor. Have a firm chair that has side arms. You can use this for support while you get dressed. Do  not have throw rugs and other things on the floor that can make you trip. What can I do in the kitchen? Clean up any spills right away. Avoid walking on wet floors. Keep items that you use a lot in easy-to-reach places. If you need to reach something above you, use a strong step stool that has a grab bar. Keep electrical cords out of the way. Do not use floor polish or wax that makes floors slippery. If you must use wax, use non-skid floor wax. Do not have throw rugs and other things on the floor that can make you trip. What can I do with my stairs? Do not leave any items on the stairs. Make sure that there are handrails on both sides of the stairs and use them. Fix handrails that are broken or loose. Make sure that handrails are as long as the stairways. Check any carpeting to make sure that it is firmly attached to the stairs. Fix any carpet that is loose or worn. Avoid having throw rugs at the top or bottom of the stairs.  If you do have throw rugs, attach them to the floor with carpet tape. Make sure that you have a light switch at the top of the stairs and the bottom of the stairs. If you do not have them, ask someone to add them for you. What else can I do to help prevent falls? Wear shoes that: Do not have high heels. Have rubber bottoms. Are comfortable and fit you well. Are closed at the toe. Do not wear sandals. If you use a stepladder: Make sure that it is fully opened. Do not climb a closed stepladder. Make sure that both sides of the stepladder are locked into place. Ask someone to hold it for you, if possible. Clearly mark and make sure that you can see: Any grab bars or handrails. First and last steps. Where the edge of each step is. Use tools that help you move around (mobility aids) if they are needed. These include: Canes. Walkers. Scooters. Crutches. Turn on the lights when you go into a dark area. Replace any light bulbs as soon as they burn out. Set up your furniture so you have a clear path. Avoid moving your furniture around. If any of your floors are uneven, fix them. If there are any pets around you, be aware of where they are. Review your medicines with your doctor. Some medicines can make you feel dizzy. This can increase your chance of falling. Ask your doctor what other things that you can do to help prevent falls. This information is not intended to replace advice given to you by your health care provider. Make sure you discuss any questions you have with your health care provider. Document Released: 12/23/2008 Document Revised: 08/04/2015 Document Reviewed: 04/02/2014 Elsevier Interactive Patient Education  2017 [employer].

## 2022-05-31 NOTE — Progress Notes (Signed)
I connected with  Alexander Yang on 05/31/22 by a audio enabled telemedicine application and verified that I am speaking with the correct person using two identifiers.  Patient Location: Home  Provider Location: Home Office  I discussed the limitations of evaluation and management by telemedicine. The patient expressed understanding and agreed to proceed.  Subjective:   Alexander Yang is a 82 y.o. male who presents for Medicare Annual/Subsequent preventive examination.  Review of Systems      Cardiac Risk Factors include: advanced age (>60men, >44 women);hypertension;diabetes mellitus     Objective:    Today's Vitals   05/31/22 1027 05/31/22 1031  Weight: 195 lb (88.5 kg)   Height: 5\' 11"  (1.803 m)   PainSc:  2    Body mass index is 27.2 kg/m.     05/31/2022   10:43 AM 09/11/2021   11:58 AM 03/31/2021    9:33 AM 05/25/2020    9:09 AM 03/30/2019    2:48 PM 03/28/2018    2:23 PM 06/26/2017   11:03 AM  Advanced Directives  Does Patient Have a Medical Advance Directive? Yes Yes Yes Yes Yes Yes Yes  Type of Paramedic of [place];Living will [place];Living will [place];Living will [place];Living will [place];Living will [place];Living will [place];Living will  Does patient want to make changes to medical advance directive?  No - Patient declined No - Patient declined    No - Patient declined  Copy of [place] in Chart? No - copy requested  No - copy requested No - copy requested No - copy requested No - copy requested     Current Medications (verified) Outpatient Encounter Medications as of 05/31/2022  Medication Sig   ACCU-CHEK AVIVA PLUS test strip USE TO CHECK BLOOD SUGAR 2 TIMES A DAY. 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   Blood Glucose Monitoring Suppl  (ACCU-CHEK AVIVA PLUS) w/Device KIT Use to check blood sugar 2 times a day.  Dx: E11.49   CALCIUM-VITAMIN D PO Take 2 tablets by mouth daily.   cholecalciferol (VITAMIN D3) 25 MCG (1000 UNIT) tablet Take 1,000 Units by mouth daily.   Continuous Blood Gluc Receiver (FREESTYLE LIBRE 2 READER) DEVI 1 each by Does not apply route daily.   Continuous Blood Gluc Sensor (FREESTYLE LIBRE 2 SENSOR) MISC 1 each by Does not apply route daily.   Continuous Glucose Monitor Sup KIT Check blood sugar continuously.  E11.49   cyanocobalamin (VITAMIN B12) 1000 MCG tablet Take 1,000 mcg by mouth daily.   darolutamide (NUBEQA) 300 MG tablet Take 600 mg by mouth 2 (two) times daily with a meal.   glipiZIDE (GLUCOTROL XL) 10 MG 24 hr tablet Take 1 tablet (10 mg total) by mouth daily with breakfast.   insulin glargine (LANTUS SOLOSTAR) 100 UNIT/ML Solostar Pen Inject 20 Units into the skin daily.   Insulin Pen Needle (PEN NEEDLES) 32G X 4 MM MISC 1 Device by Other route daily in the afternoon. Use to inject insulin daily   Lancets (ACCU-CHEK SOFT TOUCH) lancets Use to check blood sugar 2 times a day.  Dx: E11.49   Lancets Misc. (ACCU-CHEK SOFTCLIX LANCET DEV) KIT Use to check blood sugar 2 times a day.  Dx: E11.49   Leuprolide Acetate (ELIGARD [place]) Inject 1 Dose into the skin every 3 (three) months.  losartan (COZAAR) 100 MG tablet TAKE 1 TABLET BY MOUTH ONCE DAILY   OVER THE COUNTER MEDICATION Regaloid-Level teaspoon mix with water daily or every other day   venlafaxine (EFFEXOR) 37.5 MG tablet Take 37.5 mg by mouth 2 (two) times daily.   atorvastatin (LIPITOR) 40 MG tablet TAKE 1 TABLET BY MOUTH ONCE DAILY (Patient not taking: Reported on 05/31/2022)   ketoconazole (NIZORAL) 2 % cream Apply 1 application topically daily as needed for irritation. (Patient not taking: Reported on 05/31/2022)   metoCLOPramide (REGLAN) 5 MG tablet Take 1 tablet (5 mg total) by mouth 3 (three) times daily 20 minutes before meals. (Patient  not taking: Reported on 03/15/2022)   No facility-administered encounter medications on file as of 05/31/2022.    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, patient slurs at least some words and, at worst, can be understood with some difficulty 1980   Osteoporosis    Prostate cancer [facility])    Past Surgical History:  Procedure Laterality Date   ACL Tear  03/12/1978   PROSTATE BIOPSY     TONSILLECTOMY  03/13/1947   VASECTOMY     Family History  Problem Relation Age of Onset   Cerebral aneurysm Mother    Diabetes Father    Bradycardia Father    Benign prostatic hyperplasia Father    Supraventricular tachycardia Brother    Prostate cancer Brother    Supraventricular tachycardia Brother    Cancer Cousin        maternal; unknown type, dx late 24s   Esophageal cancer Neg Hx    Stomach cancer Neg Hx    Rectal cancer Neg Hx    Social History   Socioeconomic History   Marital status: Married    Spouse name: Not on file   Number of children: 2   Years of education: Not on file   Highest education level: Not on file  Occupational History   Occupation: Retired  Tobacco Use   Smoking status: Former    Packs/day: 2.00    Years: 10.00    Additional pack years: 0.00    Total pack years: 20.00    Types: Cigarettes    Quit date: 03/12/1965    Years since quitting: 57.2   Smokeless tobacco: Never  Vaping Use   Vaping Use: Never used  Substance and Sexual Activity   Alcohol use: Yes    Alcohol/week: 5.0 standard drinks of alcohol    Types: 2 Glasses of wine, 3 Cans of beer per week   Drug use: No   Sexual activity: Never  Other Topics Concern   Not on file  Social History Narrative   Exercising: 3-4 times a week.    Healthy eating habits.   Full Code   Has a living will, has HCPOA: Physiological scientist (reviewed 2015)   Social Determinants of Health   Financial Resource Strain: Low Risk  (05/31/2022)   Overall Financial Resource Strain (CARDIA)    Difficulty of Paying Living Expenses: Not hard at all  Food Insecurity: No Food Insecurity (05/31/2022)   Hunger Vital Sign    Worried About Running Out of Food in the Last Year: Never true    Ran Out of Food in the Last Year: Never true  Transportation Needs: No Transportation Needs (05/31/2022)   PRAPARE - Hydrologist (Medical): No    Lack of Transportation (Non-Medical): No  Physical Activity: Insufficiently Active (05/31/2022)   Exercise Vital Sign    Days of Exercise per Week: 3 days    Minutes of Exercise per Session: 40 min  Stress: No Stress Concern Present (05/31/2022)   [place]    Feeling of Stress : Not at all  Social Connections: Moderately Isolated (05/31/2022)   Social Connection and Isolation Panel [NHANES]    Frequency of Communication with Friends and Family: More than three times a week    Frequency of Social Gatherings with Friends and Family: More than three times a week    Attends Religious Services: Never    Marine scientist or Organizations: No    Attends Music therapist: Never    Marital Status: Married    Tobacco Counseling Counseling given: Not Answered   Clinical Intake:  Pre-visit preparation completed: Yes  Pain : 0-10 Pain Score: 2  Pain Type: Acute pain Pain Location: Abdomen Pain Descriptors / Indicators: Aching Pain Onset: More than a month ago     Nutritional Risks: Unintentional weight gain (gained 10 pounds in 6 mo) Diabetes: No  How often do you need to have someone help you when you read instructions, pamphlets, or other written materials from your doctor or pharmacy?: 1 -  Never  Diabetic?Nutrition Risk Assessment:  Has the patient had any N/V/D within the last 2 months?  No  Does the patient have any non-healing wounds?  No  Has the patient had any unintentional weight loss or weight gain?   Has gained 10lbs in 6 mo.  Diabetes:  Is the patient diabetic?  Yes  If diabetic, was a CBG obtained today?  Yes  Did the patient bring in their glucometer from home?  No  How often do you monitor your CBG's? Continuous monitor.   Financial Strains and Diabetes Management:  Are you having any financial strains with the device, your supplies or your medication? No .  Does the patient want to be seen by Chronic Care Management for management of their diabetes?  No  Would the patient like to be referred to a Nutritionist or for Diabetic Management?  No   Diabetic Exams:  Diabetic Eye Exam: Completed 12/27/21 [facility] Diabetic Foot Exam: Completed 05/24/22 Endocrinologist    Interpreter Needed?: No  Information entered by :: C.Kennedy Brines LPN   Activities of Daily Living    05/31/2022   10:44 AM  In your present state of health, do you have any difficulty performing the following activities:  Hearing? 0  Vision? 0  Difficulty concentrating or making decisions? 0  Walking or climbing stairs? 0  Dressing or bathing? 0  Doing errands, shopping? 0  Preparing Food and eating ? N  Using the Toilet? N  In the past six months, have you accidently leaked urine? N  Do you have problems with loss of bowel control? N  Managing your Medications? N  Managing your Finances? N  Housekeeping or managing your Housekeeping? N    Patient Care Team: Jinny Sanders, MD as PCP - General (Family Medicine) Wyatt Portela, MD as Consulting Physician (Oncology) Katherine Mantle, [place] as Referring Physician (Optometry)  Indicate any recent Medical Services you may have received from other than [facility] providers in the past year (date may be  approximate).     Assessment:    This is a routine wellness examination for [place].  Hearing/Vision screen Hearing Screening - Comments:: No aids Vision Screening - Comments:: Readers - [facility]  Dietary issues and exercise activities discussed: Current Exercise Habits: Structured exercise class, Type of exercise: strength training/weights;treadmill, Time (Minutes): 40, Frequency (Times/Week): 3, Weekly Exercise (Minutes/Week): 120, Exercise limited by: None identified   Goals Addressed   None    Depression Screen    05/31/2022   10:41 AM 09/11/2021   10:26 AM 05/25/2020    9:11 AM 04/28/2020   11:05 AM 03/30/2019    2:49 PM 03/28/2018   12:13 PM 06/26/2017   11:04 AM  PHQ 2/9 Scores  PHQ - 2 Score 0 6 0 1 0 0 0  PHQ- 9 Score  16 0 7 0 0     Fall Risk    05/31/2022   10:34 AM 09/11/2021   10:25 AM 08/25/2021    3:12 PM 05/25/2020    9:10 AM 03/30/2019    2:48 PM  Fall Risk   Falls in the past year? 1 1 0 0 0  Number falls in past yr: 0 0 0 0 0  Comment tripped over limb in yard      Injury with Fall? 1 1 0 0 0  Comment Broken collarbone broke collar bone     Risk for fall due to : No Fall Risks  No Fall Risks Medication side effect Medication side effect  Follow up Falls prevention discussed;Falls evaluation completed;Education provided   Falls evaluation completed;Falls prevention discussed Falls evaluation completed;Falls prevention discussed    FALL RISK PREVENTION PERTAINING TO THE HOME:  Any stairs in or around the home? No  If so, are there any without handrails? No  Home free of loose throw rugs in walkways, pet beds, electrical cords, etc? Yes  Adequate lighting in your home to reduce risk of falls? Yes   ASSISTIVE DEVICES UTILIZED TO PREVENT FALLS:  Life alert? No  Use of a cane, walker or w/c? No  Grab bars in the bathroom? Yes  Shower chair or bench in shower? Yes  Elevated toilet seat or a handicapped toilet? Yes    Cognitive Function:    05/25/2020    9:13 AM 03/30/2019    2:52 PM  03/28/2018    2:23 PM 03/26/2017   12:32 PM 09/06/2015    8:53 AM  MMSE - Mini Mental State Exam  Orientation to time 5 5 5 5 5   Orientation to Place 5 5 5 5 5   Registration 3 3 3 3 3   Attention/ Calculation 5 5 0 0 0  Recall 3 3 3 3 3   Language- name 2 objects   0 0 0  Language- repeat 1 1 1 1 1   Language- follow 3 step command   3 3 3   Language- read & follow direction   0 0 0  Write a sentence   0 0 0  Copy design   0 0 0  Total score   20 20 20         05/31/2022   10:45 AM  6CIT Screen  What Year? 0 points  What month? 0 points  What time? 0 points  Count back from 20 0 points  Months in reverse 0 points  Repeat phrase 0 points  Total Score 0 points    Immunizations Immunization History  Administered Date(s) Administered   Fluad Quad(high Dose 65+) 11/13/2018, 11/23/2021  Influenza Whole 01/10/2007, 12/09/2007, 12/14/2008   Influenza, High Dose Seasonal PF 12/11/2013, 12/17/2014, 12/02/2015, 12/02/2015, 12/04/2017, 10/30/2019, 12/09/2020   Influenza-Unspecified 12/10/2012, 12/10/2016, 11/22/2020   PFIZER Comirnaty(Gray Top)Covid-19 Tri-Sucrose Vaccine 06/21/2020   PFIZER(Purple Top)SARS-COV-2 Vaccination 04/03/2019, 04/23/2019, 10/30/2019   Pfizer Covid-19 Vaccine Bivalent Booster 66yrs & up 12/09/2020, 08/01/2021   Pneumococcal Conjugate-13 08/11/2013   Pneumococcal Polysaccharide-23 03/26/2007   Td 03/26/2007   Zoster Recombinat (Shingrix) 10/22/2019, 06/08/2020   Zoster, Live 12/03/2007    TDAP status: Due, Education has been provided regarding the importance of this vaccine. Advised may receive this vaccine at local pharmacy or Health Dept. Aware to provide a copy of the vaccination record if obtained from local pharmacy or Health Dept. Verbalized acceptance and understanding.  Flu Vaccine status: Up to date  Pneumococcal vaccine status: Up to date  Covid-19 vaccine status: Completed vaccines  Qualifies for Shingles Vaccine? Yes   Zostavax completed   unknown   Shingrix Completed?: Yes  Screening Tests Health Maintenance  Topic Date Due   Diabetic kidney evaluation - Urine ACR  08/05/2014   DTaP/Tdap/Td (2 - Tdap) 03/25/2017   COVID-19 Vaccine (7 - 2023-24 season) 11/10/2021   Diabetic kidney evaluation - eGFR measurement  11/22/2022   HEMOGLOBIN A1C  11/24/2022   OPHTHALMOLOGY EXAM  12/28/2022   FOOT EXAM  05/24/2023   Medicare Annual Wellness (AWV)  05/31/2023   Pneumonia Vaccine 56+ Years old  Completed   INFLUENZA VACCINE  Completed   Zoster Vaccines- Shingrix  Completed   HPV VACCINES  Aged Out    Health Maintenance  Health Maintenance Due  Topic Date Due   Diabetic kidney evaluation - Urine ACR  08/05/2014   DTaP/Tdap/Td (2 - Tdap) 03/25/2017   COVID-19 Vaccine (7 - 2023-24 season) 11/10/2021    Colorectal cancer screening: No longer required.   Lung Cancer Screening: (Low Dose CT Chest recommended if Age 38-80 years, 30 pack-year currently smoking OR have quit w/in 15years.) does not qualify.   Lung Cancer Screening Referral: no  Additional Screening:  Hepatitis C Screening: does not qualify; Completed no  Vision Screening: Recommended annual ophthalmology exams for early detection of glaucoma and other disorders of the eye. Is the patient up to date with their annual eye exam?  Yes  Who is the provider or what is the name of the office in which the patient attends annual eye exams? [place] If pt is not established with a provider, would they like to be referred to a provider to establish care? Yes .   Dental Screening: Recommended annual dental exams for proper oral hygiene  Community Resource Referral / Chronic Care Management: CRR required this visit?  No   CCM required this visit?  No      Plan:     I have personally reviewed and noted the following in the patient's chart:   Medical and social history Use of alcohol, tobacco or illicit drugs  Current medications and supplements including  opioid prescriptions. Patient is not currently taking opioid prescriptions. Functional ability and status Nutritional status Physical activity Advanced directives List of other physicians Hospitalizations, surgeries, and ER visits in previous 12 months Vitals Screenings to include cognitive, depression, and falls Referrals and appointments  In addition, I have reviewed and discussed with patient certain preventive protocols, quality metrics, and best practice recommendations. A written personalized care plan for preventive services as well as general preventive health recommendations were provided to patient.     Sandi Towe Moody Bruins, LPN  05/31/2022   Nurse Notes:Pt c/o abdominal pain and a 10 lb weight loss in the last 59mo. Pt has appointment scheduled with PCP to discuss.

## 2022-06-07 ENCOUNTER — Other Ambulatory Visit (INDEPENDENT_AMBULATORY_CARE_PROVIDER_SITE_OTHER): Payer: Medicare Other

## 2022-06-07 DIAGNOSIS — E1140 Type 2 diabetes mellitus with diabetic neuropathy, unspecified: Secondary | ICD-10-CM | POA: Diagnosis not present

## 2022-06-07 DIAGNOSIS — Z79899 Other long term (current) drug therapy: Secondary | ICD-10-CM

## 2022-06-07 LAB — LIPID PANEL
Cholesterol: 198 mg/dL (ref 0–200)
HDL: 47.5 mg/dL (ref >39.00)
LDL Cholesterol: 135 mg/dL — ABNORMAL HIGH (ref 0–99)
NonHDL: 150.26
Total CHOL/HDL Ratio: 4
Triglycerides: 77 mg/dL (ref 0.0–149.0)
VLDL: 15.4 mg/dL (ref 0.0–40.0)

## 2022-06-07 LAB — HEPATIC FUNCTION PANEL
ALT: 18 U/L (ref 0–53)
AST: 17 U/L (ref 0–37)
Albumin: 3.8 g/dL (ref 3.5–5.2)
Alkaline Phosphatase: 37 U/L — ABNORMAL LOW (ref 39–117)
Bilirubin, Direct: 0.2 mg/dL (ref 0.0–0.3)
Total Bilirubin: 0.7 mg/dL (ref 0.2–1.2)
Total Protein: 6.4 g/dL (ref 6.0–8.3)

## 2022-06-07 LAB — COMPREHENSIVE METABOLIC PANEL
ALT: 18 U/L (ref 0–53)
AST: 17 U/L (ref 0–37)
Albumin: 3.8 g/dL (ref 3.5–5.2)
Alkaline Phosphatase: 37 U/L — ABNORMAL LOW (ref 39–117)
BUN: 19 mg/dL (ref 6–23)
CO2: 30 mEq/L (ref 19–32)
Calcium: 9.4 mg/dL (ref 8.4–10.5)
Chloride: 106 mEq/L (ref 96–112)
Creatinine, Ser: 0.95 mg/dL (ref 0.40–1.50)
GFR: 75.16 mL/min (ref >60.00)
Glucose, Bld: 111 mg/dL — ABNORMAL HIGH (ref 70–99)
Potassium: 4.1 mEq/L (ref 3.5–5.1)
Sodium: 141 mEq/L (ref 135–145)
Total Bilirubin: 0.7 mg/dL (ref 0.2–1.2)
Total Protein: 6.4 g/dL (ref 6.0–8.3)

## 2022-06-07 LAB — MICROALBUMIN / CREATININE URINE RATIO
Creatinine,U: 144 mg/dL
Microalb Creat Ratio: 4.5 mg/g (ref 0.0–30.0)
Microalb, Ur: 6.5 mg/dL — ABNORMAL HIGH (ref 0.0–1.9)

## 2022-06-07 LAB — HEMOGLOBIN A1C: Hgb A1c MFr Bld: 7.3 % — ABNORMAL HIGH (ref 4.6–6.5)

## 2022-06-07 NOTE — Progress Notes (Signed)
No critical labs need to be addressed urgently. We will discuss labs in detail at upcoming office visit.   

## 2022-06-11 ENCOUNTER — Other Ambulatory Visit: Payer: Self-pay | Admitting: Family Medicine

## 2022-06-14 ENCOUNTER — Encounter: Payer: Medicare Other | Admitting: Family Medicine

## 2022-06-20 ENCOUNTER — Ambulatory Visit (INDEPENDENT_AMBULATORY_CARE_PROVIDER_SITE_OTHER): Payer: Medicare Other | Admitting: Family Medicine

## 2022-06-20 ENCOUNTER — Encounter: Payer: Self-pay | Admitting: Family Medicine

## 2022-06-20 VITALS — BP 124/62 | HR 78 | Temp 97.9°F | Ht 72.0 in | Wt 197.0 lb

## 2022-06-20 DIAGNOSIS — E785 Hyperlipidemia, unspecified: Secondary | ICD-10-CM

## 2022-06-20 DIAGNOSIS — Z0000 Encounter for general adult medical examination without abnormal findings: Secondary | ICD-10-CM | POA: Diagnosis not present

## 2022-06-20 DIAGNOSIS — E1140 Type 2 diabetes mellitus with diabetic neuropathy, unspecified: Secondary | ICD-10-CM | POA: Diagnosis not present

## 2022-06-20 DIAGNOSIS — R109 Unspecified abdominal pain: Secondary | ICD-10-CM

## 2022-06-20 DIAGNOSIS — E1169 Type 2 diabetes mellitus with other specified complication: Secondary | ICD-10-CM

## 2022-06-20 DIAGNOSIS — E1142 Type 2 diabetes mellitus with diabetic polyneuropathy: Secondary | ICD-10-CM | POA: Diagnosis not present

## 2022-06-20 DIAGNOSIS — G8929 Other chronic pain: Secondary | ICD-10-CM

## 2022-06-20 DIAGNOSIS — C61 Malignant neoplasm of prostate: Secondary | ICD-10-CM

## 2022-06-20 DIAGNOSIS — I152 Hypertension secondary to endocrine disorders: Secondary | ICD-10-CM

## 2022-06-20 DIAGNOSIS — E1159 Type 2 diabetes mellitus with other circulatory complications: Secondary | ICD-10-CM

## 2022-06-20 NOTE — Patient Instructions (Addendum)
Consider walking daily.  Change breakfast choice to lower carb more protein.  Call if interested in trial on imipramine, or increase dose  gabapentin.

## 2022-06-20 NOTE — Assessment & Plan Note (Addendum)
Stable,  inadequate control off statin.    Now restarted. Atorvastatin 40 mg once daily

## 2022-06-20 NOTE — Assessment & Plan Note (Signed)
Stable, chronic.  Continue current medication.   amlodipine 5 mg daily losartan 100 mg daily.

## 2022-06-20 NOTE — Assessment & Plan Note (Addendum)
Chronic, poor control.   Gabapentin low dose did not help... [facility].  Could consider amitriptyline or celexa as option. Currently [facility] counselor [facility].  Consider celexa trial as option as well.

## 2022-06-20 NOTE — Progress Notes (Signed)
Patient ID: Alexander Yang, male    DOB: [birth year], 82 y.o.   MRN: [id number]  This visit was conducted in person.  BP 124/62 (BP Location: Left Arm, Patient Position: Sitting, Cuff Size: Normal)   Pulse 78   Temp 97.9 F (36.6 C) (Temporal)   Ht 6' (1.829 m)   Wt 197 lb (89.4 kg)   SpO2 97%   BMI 26.72 kg/m    CC:  Chief Complaint  Patient presents with   Medicare Wellness    Part 2    Subjective:   HPI: Alexander Yang is a 82 y.o. male presenting on 06/20/2022 for Medicare Wellness (Part 2)  The patient presents for complete physical and review of chronic health problems. He/She also has the following acute concerns today: Chronic stomach ache continue.  He jokes about thinking about killing himself.  Has now been seeing GO at [facility].. now treating with Xifaxan... no improvement.  The patient saw a LPN or RN for medicare wellness visit. On 05/31/22  Prevention and wellness was reviewed in detail. Note reviewed and important notes copied below.   Flowsheet Row Office Visit from 06/20/2022 in [facility] at [facility]  PHQ-2 Total Score 0        Diabetes:   tolerable control, on glipizide XL and lantus 20 units alone. In last 3 months. Marcelline Deist not tolerated given nausea.  Stopped metformin due to possible GI irritation. SE to trulicity in past, severe. Lab Results  Component Value Date   HGBA1C 7.3 (H) 06/07/2022  Using medications without difficulties: Hypoglycemic episodes:none Hyperglycemic episodes:  after meals... 300s at times Feet problems: no ulcers Blood Sugars averaging: FBS 120-130 eye exam within last year:  Has  CGM  GI issues:   Epigastric ache, no heartburn.  Feels like an ache more than a pain, feels like pressure, feeling like her has to belch.. feels better for awhile after burping.  It has effected his quality of life... he doesn't want to eat.  Mild nausea, normal BMs ( occ once every 3 weeks explosive  diarrhea)  No blood in  stool.  Hypertension:    Good control on amlodipine 5 mg daily losartan 100 mg daily. BP Readings from Last 3 Encounters:  06/20/22 124/62  05/24/22 130/80  03/15/22 122/78  Using medication without problems or lightheadedness:  none Chest pain with exertion:none Edema: none Short of breath: none Average home BPs: Other issues: Wt Readings from Last 3 Encounters:  06/20/22 197 lb (89.4 kg)  05/31/22 195 lb (88.5 kg)  05/24/22 196 lb (88.9 kg)     Prostate cancer:  On Dalutomide. lASt PSA 0.7! But he does not see this as a good thing... just prolonging his stomach ache suffering.  Continues on Eliguard.  Followed by [facility]RO and [facility]C.  On active treatment.   Elevated Cholesterol: LDL NOT  at goal off atorvastatin 40 mg daily ( had no benefit  in abd pain off for 2 weeks) Has now restarted it. Lab Results  Component Value Date   CHOL 198 06/07/2022   HDL 47.50 06/07/2022   LDLCALC 135 (H) 06/07/2022   TRIG 77.0 06/07/2022   CHOLHDL 4 06/07/2022  Using medications without problems: Muscle aches:  Diet compliance: moderate Exercise: walking Other complaints:  Wt Readings from Last 3 Encounters:  06/20/22 197 lb (89.4 kg)  05/31/22 195 lb (88.5 kg)  05/24/22 196 lb (88.9 kg)    Relevant past medical, surgical, family and social history  reviewed and updated as indicated. Interim medical history since our last visit reviewed. Allergies and medications reviewed and updated. Outpatient Medications Prior to Visit  Medication Sig Dispense Refill   ACCU-CHEK AVIVA PLUS test strip USE TO CHECK BLOOD SUGAR 2 TIMES A DAY. DX: E11.49 100 strip 6   amLODipine (NORVASC) 5 MG tablet TAKE ONE TABLET BY MOUTH ONCE A DAY 90 tablet 0   atorvastatin (LIPITOR) 40 MG tablet TAKE 1 TABLET BY MOUTH ONCE DAILY 90 tablet 3   Blood Glucose Monitoring Suppl (ACCU-CHEK AVIVA PLUS) w/Device KIT Use to check blood sugar 2 times a day.  Dx: E11.49 1 kit 0   CALCIUM-VITAMIN D PO  Take 2 tablets by mouth daily.     cholecalciferol (VITAMIN D3) 25 MCG (1000 UNIT) tablet Take 1,000 Units by mouth daily.     Continuous Blood Gluc Receiver (FREESTYLE LIBRE 2 READER) DEVI 1 each by Does not apply route daily. 1 each 2   Continuous Blood Gluc Sensor (FREESTYLE LIBRE 2 SENSOR) MISC 1 each by Does not apply route daily. 2 each 11   Continuous Glucose Monitor Sup KIT Check blood sugar continuously.  E11.49 1 kit 0   cyanocobalamin (VITAMIN B12) 1000 MCG tablet Take 1,000 mcg by mouth daily.     darolutamide (NUBEQA) 300 MG tablet Take 600 mg by mouth 2 (two) times daily with a meal.     glipiZIDE (GLUCOTROL XL) 10 MG 24 hr tablet Take 1 tablet (10 mg total) by mouth daily with breakfast. 90 tablet 3   insulin glargine (LANTUS SOLOSTAR) 100 UNIT/ML Solostar Pen Inject 20 Units into the skin daily. 30 mL 3   Insulin Pen Needle (PEN NEEDLES) 32G X 4 MM MISC 1 Device by Other route daily in the afternoon. Use to inject insulin daily 100 each 3   ketoconazole (NIZORAL) 2 % cream Apply 1 application  topically daily as needed for irritation.     Lancets (ACCU-CHEK SOFT TOUCH) lancets Use to check blood sugar 2 times a day.  Dx: E11.49 100 each 11   Lancets Misc. (ACCU-CHEK SOFTCLIX LANCET DEV) KIT Use to check blood sugar 2 times a day.  Dx: E11.49 1 kit 0   Leuprolide Acetate (ELIGARD Gilbert) Inject 1 Dose into the skin every 3 (three) months.     losartan (COZAAR) 100 MG tablet TAKE 1 TABLET BY MOUTH ONCE DAILY 90 tablet 3   OVER THE COUNTER MEDICATION Regaloid-Level teaspoon mix with water daily or every other day     rifaximin (XIFAXAN) 550 MG TABS tablet TAKE ONE TABLET BY MOUTH THREE TIMES A DAY FOR INFECTION     venlafaxine (EFFEXOR) 37.5 MG tablet Take 37.5 mg by mouth 2 (two) times daily.     metoCLOPramide (REGLAN) 5 MG tablet Take 1 tablet (5 mg total) by mouth 3 (three) times daily 20 minutes before meals. (Patient not taking: Reported on 03/15/2022) 90 tablet 1   No  facility-administered medications prior to visit.     Per HPI unless specifically indicated in ROS section below Review of Systems  Constitutional:  Negative for fatigue and fever.  HENT:  Negative for ear pain.   Eyes:  Negative for pain.  Respiratory:  Negative for cough and shortness of breath.   Cardiovascular:  Negative for chest pain, palpitations and leg swelling.  Gastrointestinal:  Positive for abdominal distention. Negative for abdominal pain.  Genitourinary:  Negative for dysuria.  Musculoskeletal:  Negative for arthralgias.  Neurological:  Negative for syncope,  light-headedness and headaches.  Psychiatric/Behavioral:  Negative for dysphoric mood.    Objective:  BP 124/62 (BP Location: Left Arm, Patient Position: Sitting, Cuff Size: Normal)   Pulse 78   Temp 97.9 F (36.6 C) (Temporal)   Ht 6' (1.829 m)   Wt 197 lb (89.4 kg)   SpO2 97%   BMI 26.72 kg/m   Wt Readings from Last 3 Encounters:  06/20/22 197 lb (89.4 kg)  05/31/22 195 lb (88.5 kg)  05/24/22 196 lb (88.9 kg)      Physical Exam Constitutional:      General: He is not in acute distress.    Appearance: Normal appearance. He is well-developed. He is not ill-appearing or toxic-appearing.  HENT:     Head: Normocephalic and atraumatic.     Right Ear: Hearing, tympanic membrane, ear canal and external ear normal.     Left Ear: Hearing, tympanic membrane, ear canal and external ear normal.     Nose: Nose normal.     Mouth/Throat:     Pharynx: Uvula midline.  Eyes:     General: Lids are normal. Lids are everted, no foreign bodies appreciated.     Conjunctiva/sclera: Conjunctivae normal.     Pupils: Pupils are equal, round, and reactive to light.  Neck:     Thyroid: No thyroid mass or thyromegaly.     Vascular: No carotid bruit.     Trachea: Trachea and phonation normal.  Cardiovascular:     Rate and Rhythm: Normal rate and regular rhythm.     Pulses: Normal pulses.     Heart sounds: S1 normal and S2  normal. No murmur heard.    No gallop.  Pulmonary:     Breath sounds: Normal breath sounds. No wheezing, rhonchi or rales.  Abdominal:     General: Bowel sounds are normal.     Palpations: Abdomen is soft.     Tenderness: There is no abdominal tenderness. There is no guarding or rebound.     Hernia: No hernia is present.  Musculoskeletal:     Cervical back: Normal range of motion and neck supple.  Lymphadenopathy:     Cervical: No cervical adenopathy.  Skin:    General: Skin is warm and dry.     Findings: No rash.  Neurological:     Mental Status: He is alert.     Cranial Nerves: No cranial nerve deficit.     Sensory: No sensory deficit.     Gait: Gait normal.     Deep Tendon Reflexes: Reflexes are normal and symmetric.  Psychiatric:        Speech: Speech normal.        Behavior: Behavior normal.        Judgment: Judgment normal.       Results for orders placed or performed in visit on 06/07/22  Microalbumin / creatinine urine ratio  Result Value Ref Range   Microalb, Ur 6.5 (H) 0.0 - 1.9 mg/dL   Creatinine,U 409.8144.0 mg/dL   Microalb Creat Ratio 4.5 0.0 - 30.0 mg/g  Comprehensive metabolic panel  Result Value Ref Range   Sodium 141 135 - 145 mEq/L   Potassium 4.1 3.5 - 5.1 mEq/L   Chloride 106 96 - 112 mEq/L   CO2 30 19 - 32 mEq/L   Glucose, Bld 111 (H) 70 - 99 mg/dL   BUN 19 6 - 23 mg/dL   Creatinine, Ser 1.190.95 0.40 - 1.50 mg/dL   Total Bilirubin 0.7 0.2 - 1.2 mg/dL  Alkaline Phosphatase 37 (L) 39 - 117 U/L   AST 17 0 - 37 U/L   ALT 18 0 - 53 U/L   Total Protein 6.4 6.0 - 8.3 g/dL   Albumin 3.8 3.5 - 5.2 g/dL   GFR 16.10 >96.04 mL/min   Calcium 9.4 8.4 - 10.5 mg/dL  Lipid panel  Result Value Ref Range   Cholesterol 198 0 - 200 mg/dL   Triglycerides 54.0 0.0 - 149.0 mg/dL   HDL 98.11 >91.47 mg/dL   VLDL 82.9 0.0 - 56.2 mg/dL   LDL Cholesterol 130 (H) 0 - 99 mg/dL   Total CHOL/HDL Ratio 4    NonHDL 150.26   Hemoglobin A1c  Result Value Ref Range   Hgb A1c  MFr Bld 7.3 (H) 4.6 - 6.5 %  Hepatic Function Panel  Result Value Ref Range   Total Bilirubin 0.7 0.2 - 1.2 mg/dL   Bilirubin, Direct 0.2 0.0 - 0.3 mg/dL   Alkaline Phosphatase 37 (L) 39 - 117 U/L   AST 17 0 - 37 U/L   ALT 18 0 - 53 U/L   Total Protein 6.4 6.0 - 8.3 g/dL   Albumin 3.8 3.5 - 5.2 g/dL    This visit occurred during the SARS-CoV-2 public health emergency.  Safety protocols were in place, including screening questions prior to the visit, additional usage of staff PPE, and extensive cleaning of exam room while observing appropriate contact time as indicated for disinfecting solutions.   COVID 19 screen:  No recent travel or known exposure to COVID19 The patient denies respiratory symptoms of COVID 19 at this time. The importance of social distancing was discussed today.   Assessment and Plan   The patient's preventative maintenance and recommended screening tests for an annual wellness exam were reviewed in full today. Brought up to date unless services declined.  Counselled on the importance of diet, exercise, and its role in overall health and mortality. The patient's FH and SH was reviewed, including their home life, tobacco status, and drug and alcohol status.   Vaccines: uptodate, consider Tdap, COVID x 4 Colon:  2019 negative, no further indicated   Prostate cancer:  followed by [facility]  Problem List Items Addressed This Visit     Chronic abdominal pain     Chronic, poor control.   Gabapentin low dose did not help... [facility].  Could consider amitriptyline or celexa as option. Currently seeing counselor at [facility].  Consider celexa trial as option as well.      Diabetes mellitus with neurological manifestations, controlled (Chronic)    Chronic, improved control.  A1c at goal less than 8 Followed by endocrinology.  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      Hyperlipidemia associated with type 2 diabetes mellitus (Chronic)    Stable,  inadequate control off statin.    Now restarted. Atorvastatin 40 mg once daily      Hypertension associated with diabetes (Chronic)    Stable, chronic.  Continue current medication.   amlodipine 5 mg daily losartan 100 mg daily.      Prostate cancer (Chronic)     Chronic, active treatment      Relevant Medications   rifaximin (XIFAXAN) 550 MG TABS tablet   Other Visit Diagnoses     Routine general medical examination at a health care facility    -  Primary       Kerby Nora, MD

## 2022-06-20 NOTE — Assessment & Plan Note (Signed)
Chronic, improved control.  A1c at goal less than 8 Followed by endocrinology.  R

## 2022-06-20 NOTE — Assessment & Plan Note (Signed)
Chronic, active treatment

## 2022-06-20 NOTE — Assessment & Plan Note (Addendum)
Chronic, due to the diabetes   Stable control on gabapentin.

## 2022-07-24 ENCOUNTER — Encounter: Payer: Self-pay | Admitting: Family Medicine

## 2022-07-25 ENCOUNTER — Other Ambulatory Visit: Payer: Self-pay | Admitting: Family Medicine

## 2022-07-25 MED ORDER — TRAMADOL HCL 50 MG PO TABS: 50.0000 mg | ORAL_TABLET | Freq: Every day | ORAL | 0 refills | Status: AC | PRN

## 2022-07-25 NOTE — Progress Notes (Signed)
PDMP reviewed during this encounter. 

## 2022-08-15 ENCOUNTER — Encounter: Payer: Self-pay | Admitting: Family Medicine

## 2022-08-16 ENCOUNTER — Other Ambulatory Visit: Payer: Self-pay | Admitting: Family Medicine

## 2022-08-16 DIAGNOSIS — R5383 Other fatigue: Secondary | ICD-10-CM

## 2022-08-20 ENCOUNTER — Encounter: Payer: Self-pay | Admitting: Family Medicine

## 2022-08-29 ENCOUNTER — Other Ambulatory Visit (INDEPENDENT_AMBULATORY_CARE_PROVIDER_SITE_OTHER): Payer: Medicare Other

## 2022-08-29 ENCOUNTER — Encounter: Payer: Self-pay | Admitting: Family Medicine

## 2022-08-29 DIAGNOSIS — R5383 Other fatigue: Secondary | ICD-10-CM | POA: Diagnosis not present

## 2022-08-29 LAB — CBC WITH DIFFERENTIAL/PLATELET
Basophils Absolute: 0 10*3/uL (ref 0.0–0.1)
Basophils Relative: 0.8 % (ref 0.0–3.0)
Eosinophils Absolute: 0.2 10*3/uL (ref 0.0–0.7)
Eosinophils Relative: 4.7 % (ref 0.0–5.0)
HCT: 39 % (ref 39.0–52.0)
Hemoglobin: 13.2 g/dL (ref 13.0–17.0)
Lymphocytes Relative: 33.7 % (ref 12.0–46.0)
Lymphs Abs: 1.3 10*3/uL (ref 0.7–4.0)
MCHC: 33.8 g/dL (ref 30.0–36.0)
MCV: 94.2 fl (ref 78.0–100.0)
Monocytes Absolute: 0.4 10*3/uL (ref 0.1–1.0)
Monocytes Relative: 10.8 % (ref 3.0–12.0)
Neutro Abs: 2 10*3/uL (ref 1.4–7.7)
Neutrophils Relative %: 50 % (ref 43.0–77.0)
Platelets: 207 10*3/uL (ref 150.0–400.0)
RBC: 4.14 Mil/uL — ABNORMAL LOW (ref 4.22–5.81)
RDW: 12.6 % (ref 11.5–15.5)
WBC: 3.9 10*3/uL — ABNORMAL LOW (ref 4.0–10.5)

## 2022-08-29 LAB — VITAMIN D 25 HYDROXY (VIT D DEFICIENCY, FRACTURES): VITD: 32.75 ng/mL (ref 30.00–100.00)

## 2022-08-29 LAB — TSH: TSH: 1.74 u[IU]/mL (ref 0.35–5.50)

## 2022-08-29 LAB — VITAMIN B12: Vitamin B-12: >1500 pg/mL — ABNORMAL HIGH (ref 211–911)

## 2022-09-22 ENCOUNTER — Encounter: Payer: Self-pay | Admitting: Family Medicine

## 2022-10-10 ENCOUNTER — Other Ambulatory Visit: Payer: Self-pay | Admitting: Family Medicine

## 2022-12-15 ENCOUNTER — Encounter: Payer: Self-pay | Admitting: Family Medicine

## 2022-12-17 ENCOUNTER — Encounter: Payer: Self-pay | Admitting: Family Medicine

## 2022-12-20 ENCOUNTER — Telehealth: Payer: Self-pay | Admitting: Family Medicine

## 2022-12-20 ENCOUNTER — Ambulatory Visit: Payer: Medicare Other | Admitting: Family Medicine

## 2022-12-20 ENCOUNTER — Encounter: Payer: Self-pay | Admitting: Family Medicine

## 2022-12-20 VITALS — BP 128/78 | HR 71 | Temp 97.9°F | Ht 71.0 in | Wt 189.8 lb

## 2022-12-20 DIAGNOSIS — I152 Hypertension secondary to endocrine disorders: Secondary | ICD-10-CM

## 2022-12-20 DIAGNOSIS — E785 Hyperlipidemia, unspecified: Secondary | ICD-10-CM | POA: Diagnosis not present

## 2022-12-20 DIAGNOSIS — F411 Generalized anxiety disorder: Secondary | ICD-10-CM | POA: Insufficient documentation

## 2022-12-20 DIAGNOSIS — E1140 Type 2 diabetes mellitus with diabetic neuropathy, unspecified: Secondary | ICD-10-CM

## 2022-12-20 DIAGNOSIS — E1169 Type 2 diabetes mellitus with other specified complication: Secondary | ICD-10-CM | POA: Diagnosis not present

## 2022-12-20 DIAGNOSIS — F321 Major depressive disorder, single episode, moderate: Secondary | ICD-10-CM | POA: Diagnosis not present

## 2022-12-20 DIAGNOSIS — Z794 Long term (current) use of insulin: Secondary | ICD-10-CM | POA: Diagnosis not present

## 2022-12-20 DIAGNOSIS — E1159 Type 2 diabetes mellitus with other circulatory complications: Secondary | ICD-10-CM | POA: Diagnosis not present

## 2022-12-20 DIAGNOSIS — K30 Functional dyspepsia: Secondary | ICD-10-CM

## 2022-12-20 LAB — POCT GLYCOSYLATED HEMOGLOBIN (HGB A1C): Hemoglobin A1C: 8 % — AB (ref 4.0–5.6)

## 2022-12-20 LAB — COMPREHENSIVE METABOLIC PANEL
ALT: 15 U/L (ref 0–53)
AST: 14 U/L (ref 0–37)
Albumin: 4.2 g/dL (ref 3.5–5.2)
Alkaline Phosphatase: 37 U/L — ABNORMAL LOW (ref 39–117)
BUN: 22 mg/dL (ref 6–23)
CO2: 29 meq/L (ref 19–32)
Calcium: 9.7 mg/dL (ref 8.4–10.5)
Chloride: 107 meq/L (ref 96–112)
Creatinine, Ser: 0.92 mg/dL (ref 0.40–1.50)
GFR: 77.81 mL/min (ref >60.00)
Glucose, Bld: 161 mg/dL — ABNORMAL HIGH (ref 70–99)
Potassium: 4.8 meq/L (ref 3.5–5.1)
Sodium: 142 meq/L (ref 135–145)
Total Bilirubin: 0.9 mg/dL (ref 0.2–1.2)
Total Protein: 6.4 g/dL (ref 6.0–8.3)

## 2022-12-20 LAB — LIPID PANEL
Cholesterol: 157 mg/dL (ref 0–200)
HDL: 46.2 mg/dL (ref >39.00)
LDL Cholesterol: 93 mg/dL (ref 0–99)
NonHDL: 111.07
Total CHOL/HDL Ratio: 3
Triglycerides: 89 mg/dL (ref 0.0–149.0)
VLDL: 17.8 mg/dL (ref 0.0–40.0)

## 2022-12-20 MED ORDER — INSULIN GLULISINE 100 UNIT/ML SOLOSTAR PEN: 2.0000 [IU] | PEN_INJECTOR | Freq: Every day | SUBCUTANEOUS | 11 refills | Status: DC

## 2022-12-20 MED ORDER — INSULIN GLULISINE 100 UNIT/ML SOLOSTAR PEN: 2.0000 [IU] | PEN_INJECTOR | Freq: Every day | SUBCUTANEOUS | 3 refills | Status: AC

## 2022-12-20 NOTE — Assessment & Plan Note (Signed)
Chronic, inadequate control. ?

## 2022-12-20 NOTE — Patient Instructions (Addendum)
Add fast acting insulin at lunch. 2 units before lunch.  Please stop at the lab to have labs drawn.  Call to set local therapist.

## 2022-12-20 NOTE — Telephone Encounter (Signed)
Updated script sent to [facility]

## 2022-12-20 NOTE — Addendum Note (Signed)
Addended by: Donnamarie Poag on: 12/20/2022 02:35 PM   Modules accepted: Orders

## 2022-12-20 NOTE — Assessment & Plan Note (Signed)
Stable, chronic.  Continue current medication.   amlodipine 5 mg daily losartan 100 mg daily.

## 2022-12-20 NOTE — Telephone Encounter (Signed)
Okay just resend the prescription at 15 mL quantity so a box does not need to be split

## 2022-12-20 NOTE — Assessment & Plan Note (Signed)
Chronic, worsened control with worsening of diet. He is using glargine insulin 25 units in the morning as well as Glucotrol XL max dose. Given daily he notes spikes of blood sugar with his lunch up to 300 after meals we will have him try low-dose fast acting insulin Apidra prior to his meal.  Will start at 2 units but may need to titrate up or consider adding sliding scale.

## 2022-12-20 NOTE — Assessment & Plan Note (Signed)
Chronic inadequate control.  Referral placed for local counselor.

## 2022-12-20 NOTE — Telephone Encounter (Signed)
Called and spoke with pharmacy.  They stated for insurance purposes the quantity on the script needs to be 15mL, they are unable to split the boxes and just provide 3 mL at a time. He stated a box of 5 pens will last 140 days, so you can base the amount you want to provide off of that.

## 2022-12-20 NOTE — Assessment & Plan Note (Signed)
Chronic, recent diagnosis by GI. Recommended cognitive behavioral therapy. We have tried SSRI in the past with minimal benefit.

## 2022-12-20 NOTE — Progress Notes (Signed)
Patient ID: Alexander Yang, male    DOB: 05-Oct-1940, 82 y.o.   MRN: [id number]  This visit was conducted in person.  BP 128/78   Pulse 71   Temp 97.9 F (36.6 C)   Ht 5\' 11"  (1.803 m)   Wt 189 lb 12.8 oz (86.1 kg)   SpO2 97%   BMI 26.47 kg/m    CC:  Chief Complaint  Patient presents with   Medication Management    Subjective:   HPI: Alexander Yang is a 82 y.o. male presenting on 12/20/2022 for  DM  He has been having more issue with mood.  Continued issues with GI.Marland Kitchen dx with functional dyspepsia.  Recommended omeprazole.  Recommended treatment of depression.  [facility].. tried [facility]ism.  Starting mindful mediation for pain.  He would like local therapist.    Flowsheet Row Office Visit from 12/20/2022 in [facility] [place]  PHQ-2 Total Score 6         12/20/2022   10:29 AM  GAD 7 : Generalized Anxiety Score  Nervous, Anxious, on Edge 0  Control/stop worrying 3  Worry too much - different things 3  Trouble relaxing 2  Restless 0  Easily annoyed or irritable 3  Afraid - awful might happen 0  Total GAD 7 Score 11  Anxiety Difficulty Somewhat difficult     Diabetes:   tolerable control, on glipizide XL  10 mg and lantus 25 units alone. In last 3 months. Marcelline Deist not tolerated given nausea.  Stopped metformin due to possible GI irritation.  He has not been watching diet as well as in the past... more ice cream SE to trulicity in past, severe. Lab Results  Component Value Date   HGBA1C 8.0 (A) 12/20/2022  Using medications without difficulties: Hypoglycemic episodes:none Hyperglycemic episodes:  after meals... 300s at times Feet problems: no ulcers Blood Sugars averaging: FBS 114..using CGM.Marland Kitchen eye exam within last year:  Has  CGM   Hypertension:    Good control on amlodipine 5 mg daily losartan 100 mg daily. BP Readings from Last 3 Encounters:  12/20/22 128/78  06/20/22 124/62  05/24/22 130/80   Using medication without problems or lightheadedness:  none Chest pain with exertion:none Edema: none Short of breath: none Average home BPs: Other issues: Wt Readings from Last 3 Encounters:  12/20/22 189 lb 12.8 oz (86.1 kg)  06/20/22 197 lb (89.4 kg)  05/31/22 195 lb (88.5 kg)       Elevated Cholesterol:  Due for re-eval. Lab Results  Component Value Date   CHOL 198 06/07/2022   HDL 47.50 06/07/2022   LDLCALC 135 (H) 06/07/2022   TRIG 77.0 06/07/2022   CHOLHDL 4 06/07/2022  Using medications without problems: Muscle aches:  Diet compliance: moderate Exercise: walking Other complaints:  Wt Readings from Last 3 Encounters:  12/20/22 189 lb 12.8 oz (86.1 kg)  06/20/22 197 lb (89.4 kg)  05/31/22 195 lb (88.5 kg)    Relevant past medical, surgical, family and social history reviewed and updated as indicated. Interim medical history since our last visit reviewed. Allergies and medications reviewed and updated. Outpatient Medications Prior to Visit  Medication Sig Dispense Refill   amLODipine (NORVASC) 5 MG tablet TAKE ONE TABLET BY MOUTH ONCE A DAY 90 tablet 3   atorvastatin (LIPITOR) 40 MG tablet TAKE 1 TABLET BY MOUTH ONCE DAILY 90 tablet 3   atorvastatin (LIPITOR) 40 MG tablet Take 40 mg by mouth daily.  CALCIUM-VITAMIN D PO Take 2 tablets by mouth daily.     cholecalciferol (VITAMIN D3) 25 MCG (1000 UNIT) tablet Take 1,000 Units by mouth daily.     Continuous Blood Gluc Receiver (FREESTYLE LIBRE 2 READER) DEVI 1 each by Does not apply route daily. 1 each 2   Continuous Blood Gluc Sensor (FREESTYLE LIBRE 2 SENSOR) MISC 1 each by Does not apply route daily. 2 each 11   Continuous Glucose Monitor Sup KIT Check blood sugar continuously.  E11.49 1 kit 0   cyanocobalamin (VITAMIN B12) 1000 MCG tablet Take 1,000 mcg by mouth daily.     darolutamide (NUBEQA) 300 MG tablet Take 600 mg by mouth 2 (two) times daily with a meal.     glipiZIDE (GLUCOTROL XL) 10 MG 24 hr  tablet Take 1 tablet (10 mg total) by mouth daily with breakfast. 90 tablet 3   imipramine (TOFRANIL) 10 MG tablet Take 20 mg by mouth 2 (two) times daily.     insulin glargine (LANTUS SOLOSTAR) 100 UNIT/ML Solostar Pen Inject 20 Units into the skin daily. 30 mL 3   Insulin Pen Needle (PEN NEEDLES) 32G X 4 MM MISC 1 Device by Other route daily in the afternoon. Use to inject insulin daily 100 each 3   ketoconazole (NIZORAL) 2 % cream Apply 1 application  topically daily as needed for irritation.     Leuprolide Acetate (ELIGARD [place]) Inject 1 Dose into the skin every 3 (three) months.     losartan (COZAAR) 100 MG tablet TAKE 1 TABLET BY MOUTH ONCE DAILY 90 tablet 3   omeprazole (PRILOSEC) 40 MG capsule Take 40 mg by mouth daily.     OVER THE COUNTER MEDICATION Regaloid-Level teaspoon mix with water daily or every other day     ACCU-CHEK AVIVA PLUS test strip USE TO CHECK BLOOD SUGAR 2 TIMES A DAY. DX: E11.49 (Patient not taking: Reported on 12/20/2022) 100 strip 6   Blood Glucose Monitoring Suppl (ACCU-CHEK AVIVA PLUS) w/Device KIT Use to check blood sugar 2 times a day.  Dx: E11.49 (Patient not taking: Reported on 12/20/2022) 1 kit 0   Lancets (ACCU-CHEK SOFT TOUCH) lancets Use to check blood sugar 2 times a day.  Dx: E11.49 (Patient not taking: Reported on 12/20/2022) 100 each 11   Lancets Misc. (ACCU-CHEK SOFTCLIX LANCET DEV) KIT Use to check blood sugar 2 times a day.  Dx: E11.49 (Patient not taking: Reported on 12/20/2022) 1 kit 0   rifaximin (XIFAXAN) 550 MG TABS tablet TAKE ONE TABLET BY MOUTH THREE TIMES A DAY FOR INFECTION     venlafaxine (EFFEXOR) 37.5 MG tablet Take 37.5 mg by mouth 2 (two) times daily.     No facility-administered medications prior to visit.     Per HPI unless specifically indicated in ROS section below Review of Systems  Constitutional:  Negative for fatigue and fever.  HENT:  Negative for ear pain.   Eyes:  Negative for pain.  Respiratory:  Negative for cough and  shortness of breath.   Cardiovascular:  Negative for chest pain, palpitations and leg swelling.  Gastrointestinal:  Positive for abdominal distention. Negative for abdominal pain.  Genitourinary:  Negative for dysuria.  Musculoskeletal:  Negative for arthralgias.  Neurological:  Negative for syncope, light-headedness and headaches.  Psychiatric/Behavioral:  Negative for dysphoric mood.    Objective:  BP 128/78   Pulse 71   Temp 97.9 F (36.6 C)   Ht 5\' 11"  (1.803 m)   Wt 189 lb 12.8  oz (86.1 kg)   SpO2 97%   BMI 26.47 kg/m   Wt Readings from Last 3 Encounters:  12/20/22 189 lb 12.8 oz (86.1 kg)  06/20/22 197 lb (89.4 kg)  05/31/22 195 lb (88.5 kg)      Physical Exam Constitutional:      General: He is not in acute distress.    Appearance: Normal appearance. He is well-developed. He is not ill-appearing or toxic-appearing.  HENT:     Head: Normocephalic and atraumatic.     Right Ear: Hearing, tympanic membrane, ear canal and external ear normal.     Left Ear: Hearing, tympanic membrane, ear canal and external ear normal.     Nose: Nose normal.     Mouth/Throat:     Pharynx: Uvula midline.  Eyes:     General: Lids are normal. Lids are everted, no foreign bodies appreciated.     Conjunctiva/sclera: Conjunctivae normal.     Pupils: Pupils are equal, round, and reactive to light.  Neck:     Thyroid: No thyroid mass or thyromegaly.     Vascular: No carotid bruit.     Trachea: Trachea and phonation normal.  Cardiovascular:     Rate and Rhythm: Normal rate and regular rhythm.     Pulses: Normal pulses.     Heart sounds: S1 normal and S2 normal. No murmur heard.    No gallop.  Pulmonary:     Breath sounds: Normal breath sounds. No wheezing, rhonchi or rales.  Abdominal:     General: Bowel sounds are normal.     Palpations: Abdomen is soft.     Tenderness: There is no abdominal tenderness. There is no guarding or rebound.     Hernia: No hernia is present.   Musculoskeletal:     Cervical back: Normal range of motion and neck supple.  Lymphadenopathy:     Cervical: No cervical adenopathy.  Skin:    General: Skin is warm and dry.     Findings: No rash.  Neurological:     Mental Status: He is alert.     Cranial Nerves: No cranial nerve deficit.     Sensory: No sensory deficit.     Gait: Gait normal.     Deep Tendon Reflexes: Reflexes are normal and symmetric.  Psychiatric:        Speech: Speech normal.        Behavior: Behavior normal.        Judgment: Judgment normal.       Results for orders placed or performed in visit on 12/20/22  POCT glycosylated hemoglobin (Hb A1C)  Result Value Ref Range   Hemoglobin A1C 8.0 (A) 4.0 - 5.6 %   HbA1c POC (<> result, manual entry)     HbA1c, POC (prediabetic range)     HbA1c, POC (controlled diabetic range)      This visit occurred during the SARS-CoV-2 public health emergency.  Safety protocols were in place, including screening questions prior to the visit, additional usage of staff PPE, and extensive cleaning of exam room while observing appropriate contact time as indicated for disinfecting solutions.   COVID 19 screen:  No recent travel or known exposure to COVID19 The patient denies respiratory symptoms of COVID 19 at this time. The importance of social distancing was discussed today.   Assessment and Plan   The patient's preventative maintenance and recommended screening tests for an annual wellness exam were reviewed in full today. Brought up to date unless services declined.  Counselled on the importance of diet, exercise, and its role in overall health and mortality. The patient's FH and SH was reviewed, including their home life, tobacco status, and drug and alcohol status.   Vaccines: uptodate, consider Tdap, COVID x 4 Colon:  2019 negative, no further indicated   Prostate cancer:  followed by [facility]  Problem List Items Addressed This Visit     Current moderate episode of  major depressive disorder without prior episode (HCC)    Chronic, inadequate control      Relevant Medications   imipramine (TOFRANIL) 10 MG tablet   Other Relevant Orders   Ambulatory referral to Psychology   Diabetes mellitus with neurological manifestations, controlled (HCC) - Primary (Chronic)    Chronic, worsened control with worsening of diet. He is using glargine insulin 25 units in the morning as well as Glucotrol XL max dose. Given daily he notes spikes of blood sugar with his lunch up to 300 after meals we will have him try low-dose fast acting insulin Apidra prior to his meal.  Will start at 2 units but may need to titrate up or consider adding sliding scale.      Relevant Medications   atorvastatin (LIPITOR) 40 MG tablet   insulin glulisine (APIDRA) 100 UNIT/ML Solostar Pen   Other Relevant Orders   POCT glycosylated hemoglobin (Hb A1C) (Completed)   Functional dyspepsia    Chronic, recent diagnosis by GI. Recommended cognitive behavioral therapy. We have tried SSRI in the past with minimal benefit.      Relevant Orders   Ambulatory referral to Psychology   GAD (generalized anxiety disorder)    Chronic inadequate control.  Referral placed for local counselor.      Relevant Medications   imipramine (TOFRANIL) 10 MG tablet   Other Relevant Orders   Ambulatory referral to Psychology   Hyperlipidemia associated with type 2 diabetes mellitus (HCC) (Chronic)    Due for reevaluation      Relevant Medications   atorvastatin (LIPITOR) 40 MG tablet   insulin glulisine (APIDRA) 100 UNIT/ML Solostar Pen   Other Relevant Orders   Comprehensive metabolic panel   Lipid panel   Hypertension associated with diabetes (HCC) (Chronic)    Stable, chronic.  Continue current medication.   amlodipine 5 mg daily losartan 100 mg daily.      Relevant Medications   atorvastatin (LIPITOR) 40 MG tablet   insulin glulisine (APIDRA) 100 UNIT/ML Solostar Pen    Kerby Nora, MD

## 2022-12-20 NOTE — Telephone Encounter (Signed)
Pharmacy contacted the office regarding medication insulin glulisine (APIDRA) 100 UNIT/ML Solostar Pen , had questions regarding quantity of this medication. Requested a call back when able, please advise

## 2022-12-20 NOTE — Assessment & Plan Note (Signed)
Due for reevaluation 

## 2022-12-24 ENCOUNTER — Encounter: Payer: Self-pay | Admitting: Family Medicine

## 2022-12-25 MED ORDER — IMIPRAMINE HCL 10 MG PO TABS: 40.0000 mg | ORAL_TABLET | Freq: Every day | ORAL | 0 refills | Status: AC

## 2022-12-28 ENCOUNTER — Encounter: Payer: Self-pay | Admitting: Family Medicine

## 2023-01-02 LAB — HM DIABETES EYE EXAM

## 2023-01-03 ENCOUNTER — Encounter: Payer: Self-pay | Admitting: Family Medicine

## 2023-02-15 ENCOUNTER — Encounter: Payer: Self-pay | Admitting: Pharmacist

## 2023-02-15 NOTE — Progress Notes (Signed)
Pharmacy Quality Measure Review  This patient is appearing on a report for being at risk of failing the adherence measure for hypertension (ACEi/ARB) medications this calendar year.   Medication: losartan  Patient fills medications at the [facility] and therefore patient appears on insurance list as at risk for failing adherence measures.   No action at this time.

## 2023-02-20 ENCOUNTER — Encounter: Payer: Self-pay | Admitting: Family Medicine

## 2023-02-22 ENCOUNTER — Encounter: Payer: Self-pay | Admitting: Family Medicine

## 2023-02-27 ENCOUNTER — Other Ambulatory Visit: Payer: Self-pay | Admitting: Medical Genetics

## 2023-02-27 ENCOUNTER — Other Ambulatory Visit: Payer: Self-pay | Admitting: Family Medicine

## 2023-02-28 ENCOUNTER — Other Ambulatory Visit
Admission: RE | Admit: 2023-02-28 | Discharge: 2023-02-28 | Disposition: A | Discharge status: Home / Self Care | Payer: Self-pay | Source: Ambulatory Visit | Attending: Medical Genetics | Admitting: Medical Genetics

## 2023-03-11 LAB — GENECONNECT MOLECULAR SCREEN: Genetic Analysis Overall Interpretation: NEGATIVE

## 2023-03-26 ENCOUNTER — Encounter: Payer: Self-pay | Admitting: Family Medicine

## 2023-03-26 ENCOUNTER — Ambulatory Visit: Payer: Medicare Other | Admitting: Family Medicine

## 2023-03-26 VITALS — BP 112/80 | HR 69 | Temp 97.4°F | Ht 71.0 in | Wt 193.1 lb

## 2023-03-26 DIAGNOSIS — Z7984 Long term (current) use of oral hypoglycemic drugs: Secondary | ICD-10-CM

## 2023-03-26 DIAGNOSIS — E1140 Type 2 diabetes mellitus with diabetic neuropathy, unspecified: Secondary | ICD-10-CM

## 2023-03-26 DIAGNOSIS — E1159 Type 2 diabetes mellitus with other circulatory complications: Secondary | ICD-10-CM | POA: Diagnosis not present

## 2023-03-26 DIAGNOSIS — I152 Hypertension secondary to endocrine disorders: Secondary | ICD-10-CM

## 2023-03-26 DIAGNOSIS — R2689 Other abnormalities of gait and mobility: Secondary | ICD-10-CM | POA: Diagnosis not present

## 2023-03-26 DIAGNOSIS — Z794 Long term (current) use of insulin: Secondary | ICD-10-CM

## 2023-03-26 DIAGNOSIS — F321 Major depressive disorder, single episode, moderate: Secondary | ICD-10-CM

## 2023-03-26 DIAGNOSIS — E1142 Type 2 diabetes mellitus with diabetic polyneuropathy: Secondary | ICD-10-CM

## 2023-03-26 LAB — POCT GLYCOSYLATED HEMOGLOBIN (HGB A1C): Hemoglobin A1C: 7.2 % — AB (ref 4.0–5.6)

## 2023-03-26 MED ORDER — BUPROPION HCL ER (XL) 150 MG PO TB24: 150.0000 mg | ORAL_TABLET | Freq: Every day | ORAL | 3 refills | Status: DC

## 2023-03-26 NOTE — Assessment & Plan Note (Signed)
 Chronic, likely contributing to change in gait that his wife has noted.  No current pain.   Can try ALA (omega-3 fatty acid)  [phone] mg daily dose to see if it helps with peripheral neuropathy.

## 2023-03-26 NOTE — Progress Notes (Signed)
 Patient ID: Alexander Yang, male    DOB: 08-24-1940, 83 y.o.   MRN: [id number]  This visit was conducted in person.  BP 112/80 (BP Location: Right Arm, Patient Position: Sitting, Cuff Size: Large)   Pulse 69   Temp (!) 97.4 F (36.3 C) (Temporal)   Ht 5' 11 (1.803 m)   Wt 193 lb 2 oz (87.6 kg)   SpO2 98%   BMI 26.94 kg/m    CC:  Chief Complaint  Patient presents with   Diabetes    Subjective:   HPI: Alexander Yang is a 83 y.o. male presenting on 03/26/2023 for  DM   Wife has noted he shuffles his feet x 5 months. Oncologist recommended referral to PT.  He has not noted this but does state that his peripheral neuropathy is continuing to worsen.  He feels like he has wax paper on his feet.  There is no pain in his feet No leg weakness  No falls.   Continued issues with GI.SABRA dx with functional dyspepsia.  Recommended omeprazole.  Recommended treatment of depression.  [facility].. tried hypnotism. Tried  mindful mediation for pain.  Flowsheet Row Office Visit from 03/26/2023 in [facility] [place]  PHQ-2 Total Score 6         03/26/2023   11:25 AM 12/20/2022   10:29 AM  GAD 7 : Generalized Anxiety Score  Nervous, Anxious, on Edge 3 0  Control/stop worrying 3 3  Worry too much - different things 3 3  Trouble relaxing 3 2  Restless 3 0  Easily annoyed or irritable 3 3  Afraid - awful might happen 3 0  Total GAD 7 Score 21 11  Anxiety Difficulty Somewhat difficult Somewhat difficult     Diabetes:  Improved control, on glipizide  XL  10 mg and lantus   30 units  daily  Lispro  2 Units prior to  meals if FBS > 130.. usually given 1-2 times a day, but not always. Farxiga  not tolerated given nausea.   Stopped metformin  due to possible GI irritation.  He has not been watching diet as well as in the past... more ice cream SE to trulicity  in past, severe. Lab Results  Component Value Date   HGBA1C 7.2 (A) 03/26/2023   Using medications without difficulties: Hypoglycemic episodes: once a week. . 5 Am... not assocaited with skipping meal. Hyperglycemic episodes:  after meals... 300s at times Feet problems: no ulcers Blood Sugars averaging: FBS 114..using CGM.SABRA eye exam within last year:  Has  CGM   Hypertension:    Good control on amlodipine  5 mg daily losartan  100 mg daily. BP Readings from Last 3 Encounters:  03/26/23 112/80  12/20/22 128/78  06/20/22 124/62  Using medication without problems or lightheadedness:  none Chest pain with exertion:none Edema: none Short of breath: none Average home BPs: Other issues: Wt Readings from Last 3 Encounters:  03/26/23 193 lb 2 oz (87.6 kg)  12/20/22 189 lb 12.8 oz (86.1 kg)  06/20/22 197 lb (89.4 kg)       Elevated Cholesterol:  Due for re-eval. Lab Results  Component Value Date   CHOL 157 12/20/2022   HDL 46.20 12/20/2022   LDLCALC 93 12/20/2022   TRIG 89.0 12/20/2022   CHOLHDL 3 12/20/2022  Using medications without problems: Muscle aches:  Diet compliance: moderate Exercise: walking Other complaints:  Wt Readings from Last 3 Encounters:  03/26/23 193 lb 2 oz (87.6 kg)  12/20/22 189 lb 12.8 oz (86.1 kg)  06/20/22 197 lb (89.4 kg)    Relevant past medical, surgical, family and social history reviewed and updated as indicated. Interim medical history since our last visit reviewed. Allergies and medications reviewed and updated. Outpatient Medications Prior to Visit  Medication Sig Dispense Refill   amLODipine  (NORVASC ) 5 MG tablet TAKE ONE TABLET BY MOUTH ONCE A DAY 90 tablet 3   atorvastatin  (LIPITOR) 40 MG tablet TAKE ONE TABLET BY MOUTH ONCE DAILY 90 tablet 3   CALCIUM -VITAMIN D  PO Take 2 tablets by mouth daily.     Continuous Blood Gluc Receiver (FREESTYLE LIBRE 2 READER) DEVI 1 each by Does not apply route daily. 1 each 2   Continuous Blood Gluc Sensor (FREESTYLE LIBRE 2 SENSOR) MISC 1 each by Does not apply route daily. 2 each  11   cyanocobalamin  (VITAMIN B12) 1000 MCG tablet Take 1,000 mcg by mouth daily.     darolutamide (NUBEQA) 300 MG tablet Take 600 mg by mouth 2 (two) times daily with a meal.     degarelix (FIRMAGON) 80 MG injection Inject 80 mg into the skin every 28 (twenty-eight) days.     gabapentin (NEURONTIN) 300 MG capsule Take 300 mg by mouth at bedtime.     glipiZIDE  (GLUCOTROL  XL) 10 MG 24 hr tablet Take 1 tablet (10 mg total) by mouth daily with breakfast. 90 tablet 3   imipramine  (TOFRANIL ) 10 MG tablet Take 4 tablets (40 mg total) by mouth at bedtime. 28 tablet 0   insulin  glargine (LANTUS  SOLOSTAR) 100 UNIT/ML Solostar Pen Inject 20 Units into the skin daily. 30 mL 3   insulin  glulisine (APIDRA ) 100 UNIT/ML Solostar Pen Inject 2 Units into the skin daily with lunch. 15 mL 3   Insulin  Pen Needle (PEN NEEDLES) 32G X 4 MM MISC 1 Device by Other route daily in the afternoon. Use to inject insulin  daily 100 each 3   ketoconazole  (NIZORAL ) 2 % cream Apply 1 application  topically daily as needed for irritation.     losartan  (COZAAR ) 100 MG tablet TAKE 1 TABLET BY MOUTH ONCE DAILY 90 tablet 3   omeprazole (PRILOSEC) 40 MG capsule Take 40 mg by mouth daily.     OVER THE COUNTER MEDICATION Regaloid-Level teaspoon mix with water daily or every other day     ACCU-CHEK AVIVA PLUS test strip USE TO CHECK BLOOD SUGAR 2 TIMES A DAY. DX: E11.49 (Patient not taking: Reported on 12/20/2022) 100 strip 6   atorvastatin  (LIPITOR) 40 MG tablet TAKE 1 TABLET BY MOUTH ONCE DAILY 90 tablet 3   atorvastatin  (LIPITOR) 40 MG tablet Take 40 mg by mouth daily.     Blood Glucose Monitoring Suppl (ACCU-CHEK AVIVA PLUS) w/Device KIT Use to check blood sugar 2 times a day.  Dx: E11.49 (Patient not taking: Reported on 12/20/2022) 1 kit 0   cholecalciferol (VITAMIN D3) 25 MCG (1000 UNIT) tablet Take 1,000 Units by mouth daily.     Continuous Glucose Monitor Sup KIT Check blood sugar continuously.  E11.49 1 kit 0   Lancets (ACCU-CHEK  SOFT TOUCH) lancets Use to check blood sugar 2 times a day.  Dx: E11.49 (Patient not taking: Reported on 12/20/2022) 100 each 11   Lancets Misc. (ACCU-CHEK SOFTCLIX LANCET DEV) KIT Use to check blood sugar 2 times a day.  Dx: E11.49 (Patient not taking: Reported on 12/20/2022) 1 kit 0   Leuprolide  Acetate (ELIGARD  [place]) Inject 1 Dose into the skin every 3 (three)  months.     No facility-administered medications prior to visit.     Per HPI unless specifically indicated in ROS section below Review of Systems  Constitutional:  Negative for fatigue and fever.  HENT:  Negative for ear pain.   Eyes:  Negative for pain.  Respiratory:  Negative for cough and shortness of breath.   Cardiovascular:  Negative for chest pain, palpitations and leg swelling.  Gastrointestinal:  Positive for abdominal distention. Negative for abdominal pain.  Genitourinary:  Negative for dysuria.  Musculoskeletal:  Negative for arthralgias.  Neurological:  Negative for syncope, light-headedness and headaches.  Psychiatric/Behavioral:  Negative for dysphoric mood.    Objective:  BP 112/80 (BP Location: Right Arm, Patient Position: Sitting, Cuff Size: Large)   Pulse 69   Temp (!) 97.4 F (36.3 C) (Temporal)   Ht 5' 11 (1.803 m)   Wt 193 lb 2 oz (87.6 kg)   SpO2 98%   BMI 26.94 kg/m   Wt Readings from Last 3 Encounters:  03/26/23 193 lb 2 oz (87.6 kg)  12/20/22 189 lb 12.8 oz (86.1 kg)  06/20/22 197 lb (89.4 kg)      Physical Exam Constitutional:      General: He is not in acute distress.    Appearance: Normal appearance. He is well-developed. He is not ill-appearing or toxic-appearing.  HENT:     Head: Normocephalic and atraumatic.     Right Ear: Hearing, tympanic membrane, ear canal and external ear normal.     Left Ear: Hearing, tympanic membrane, ear canal and external ear normal.     Nose: Nose normal.     Mouth/Throat:     Pharynx: Uvula midline.  Eyes:     General: Lids are normal. Lids are  everted, no foreign bodies appreciated.     Conjunctiva/sclera: Conjunctivae normal.     Pupils: Pupils are equal, round, and reactive to light.  Neck:     Thyroid : No thyroid  mass or thyromegaly.     Vascular: No carotid bruit.     Trachea: Trachea and phonation normal.  Cardiovascular:     Rate and Rhythm: Normal rate and regular rhythm.     Pulses: Normal pulses.     Heart sounds: S1 normal and S2 normal. No murmur heard.    No gallop.  Pulmonary:     Breath sounds: Normal breath sounds. No wheezing, rhonchi or rales.  Abdominal:     General: Bowel sounds are normal.     Palpations: Abdomen is soft.     Tenderness: There is no abdominal tenderness. There is no guarding or rebound.     Hernia: No hernia is present.  Musculoskeletal:     Cervical back: Normal range of motion and neck supple.  Lymphadenopathy:     Cervical: No cervical adenopathy.  Skin:    General: Skin is warm and dry.     Findings: No rash.  Neurological:     Mental Status: He is alert.     Cranial Nerves: No cranial nerve deficit.     Sensory: No sensory deficit.     Gait: Gait normal.     Deep Tendon Reflexes: Reflexes are normal and symmetric.  Psychiatric:        Speech: Speech normal.        Behavior: Behavior normal.        Judgment: Judgment normal.       Results for orders placed or performed in visit on 03/26/23  POCT glycosylated hemoglobin (Hb  A1C)   Collection Time: 03/26/23 11:32 AM  Result Value Ref Range   Hemoglobin A1C 7.2 (A) 4.0 - 5.6 %   HbA1c POC (<> result, manual entry)     HbA1c, POC (prediabetic range)     HbA1c, POC (controlled diabetic range)      This visit occurred during the SARS-CoV-2 public health emergency.  Safety protocols were in place, including screening questions prior to the visit, additional usage of staff PPE, and extensive cleaning of exam room while observing appropriate contact time as indicated for disinfecting solutions.   COVID 19 screen:  No recent  travel or known exposure to COVID19 The patient denies respiratory symptoms of COVID 19 at this time. The importance of social distancing was discussed today.   Assessment and Plan  Problem List Items Addressed This Visit     Current moderate episode of major depressive disorder without prior episode (HCC)   Chronic, inadequate control   He does feel imipramine  and gabapentin are helping with sleep  Worsened control since moving to [facility] apartment in last 2 months. Will have him try Wellbutrin  XL 150 mg p.o. daily. He will call with an update on mood in 4 weeks.      Relevant Medications   buPROPion  (WELLBUTRIN  XL) 150 MG 24 hr tablet   Diabetes mellitus with neurological manifestations, controlled (HCC) - Primary (Chronic)   Chronic, improved control on higher dose of Lantus  and with mealtime sliding scale rapid acting insulin .  He is using glargine insulin  30 units in the morning as well as Glucotrol  XL max dose.  low-dose fast acting insulin  Apidra  prior to his meal per Sliding scale. Given some early morning lows, encouraged high-protein high-fiber bedtime snack.      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  No current pain.   Can try ALA (omega-3 fatty acid)  [phone] mg daily dose to see if it helps with peripheral neuropathy.      Relevant Medications   gabapentin (NEURONTIN) 300 MG capsule   buPROPion  (WELLBUTRIN  XL) 150 MG 24 hr tablet   Hypertension associated with diabetes (HCC) (Chronic)   Stable, chronic.  Continue current medication.   amlodipine  5 mg daily losartan  100 mg daily.      Other Visit Diagnoses       Shuffling gait           Greig Ring, MD

## 2023-03-26 NOTE — Assessment & Plan Note (Addendum)
 Chronic, improved control on higher dose of Lantus  and with mealtime sliding scale rapid acting insulin .  He is using glargine insulin  30 units in the morning as well as Glucotrol  XL max dose.  low-dose fast acting insulin  Apidra  prior to his meal per Sliding scale. Given some early morning lows, encouraged high-protein high-fiber bedtime snack.

## 2023-03-26 NOTE — Assessment & Plan Note (Addendum)
 Chronic, inadequate control   He does feel imipramine  and gabapentin are helping with sleep  Worsened control since moving to [facility] apartment in last 2 months. Will have him try Wellbutrin  XL 150 mg p.o. daily. He will call with an update on mood in 4 weeks.

## 2023-03-26 NOTE — Assessment & Plan Note (Signed)
Stable, chronic.  Continue current medication.   amlodipine 5 mg daily losartan 100 mg daily.

## 2023-04-06 ENCOUNTER — Encounter: Payer: Self-pay | Admitting: Family Medicine

## 2023-04-12 ENCOUNTER — Other Ambulatory Visit: Payer: Self-pay | Admitting: Family Medicine

## 2023-05-13 ENCOUNTER — Encounter: Payer: Self-pay | Admitting: Family Medicine

## 2023-05-13 DIAGNOSIS — E852 Heredofamilial amyloidosis, unspecified: Secondary | ICD-10-CM

## 2023-05-14 ENCOUNTER — Other Ambulatory Visit: Payer: Self-pay | Admitting: Family Medicine

## 2023-05-31 ENCOUNTER — Telehealth: Payer: Self-pay | Admitting: *Deleted

## 2023-05-31 DIAGNOSIS — E1140 Type 2 diabetes mellitus with diabetic neuropathy, unspecified: Secondary | ICD-10-CM

## 2023-05-31 DIAGNOSIS — E1169 Type 2 diabetes mellitus with other specified complication: Secondary | ICD-10-CM

## 2023-05-31 NOTE — Telephone Encounter (Signed)
-----   Message from Alvina Chou sent at 05/31/2023  2:21 PM EDT ----- Regarding: Lab orders for Wed, 4.9.25 Patient is scheduled for CPX labs, please order future labs, Thanks , Camelia Eng

## 2023-06-04 ENCOUNTER — Encounter: Payer: Self-pay | Admitting: *Deleted

## 2023-06-05 ENCOUNTER — Ambulatory Visit: Payer: Medicare Other

## 2023-06-05 VITALS — BP 110/60 | Ht 71.0 in | Wt 193.4 lb

## 2023-06-05 DIAGNOSIS — Z0000 Encounter for general adult medical examination without abnormal findings: Secondary | ICD-10-CM

## 2023-06-05 NOTE — Progress Notes (Signed)
 Noted.. will do EKG and possible event monitor if symptoms continuing.

## 2023-06-05 NOTE — Patient Instructions (Signed)
 Alexander Yang , Thank you for taking time to come for your Medicare Wellness Visit. I appreciate your ongoing commitment to your health goals. Please review the following plan we discussed and let me know if I can assist you in the future.   Referrals/Orders/Follow-Ups/Clinician Recommendations: none  This is a list of the screening recommended for you and due dates:  Health Maintenance  Topic Date Due   DTaP/Tdap/Td vaccine (2 - Tdap) 03/25/2017   COVID-19 Vaccine (9 - Pfizer risk 2024-25 season) 05/28/2023   Complete foot exam   05/24/2023   Yearly kidney health urinalysis for diabetes  06/07/2023   Hemoglobin A1C  09/23/2023   Yearly kidney function blood test for diabetes  12/20/2023   Eye exam for diabetics  01/02/2024   Medicare Annual Wellness Visit  06/04/2024   Pneumonia Vaccine  Completed   Flu Shot  Completed   Zoster (Shingles) Vaccine  Completed   HPV Vaccine  Aged Out   Hepatitis C Screening  Discontinued    Advanced directives: (Copy Requested) Please bring a copy of your health care power of attorney and living will to the office to be added to your chart at your convenience. You can mail to [facility] [address] [place], [place] [address] or email to [email or web address]  Next Medicare Annual Wellness Visit scheduled for next year: Yes 06/05/24 @ 10:50am televisit

## 2023-06-05 NOTE — Progress Notes (Signed)
 Subjective:   Alexander Yang is a 83 y.o. who presents for a Medicare Wellness preventive visit.  Patient Location: Home  Provider Location: Office/Clinic  I discussed the limitations of evaluation and management by telemedicine. The patient expressed understanding and agreed to proceed.  Vital Signs: Because this visit was a virtual/telehealth visit, some criteria may be missing or patient reported. Any vitals not documented were not able to be obtained and vitals that have been documented are patient reported.   Persons Participating in Visit: Patient.  AWV Questionnaire: No: Patient Medicare AWV questionnaire was not completed prior to this visit.  Cardiac Risk Factors include: advanced age (>19men, >59 women);diabetes mellitus;dyslipidemia;male gender;hypertension     Objective:    Today's Vitals   06/05/23 1057 06/05/23 1058  BP: 110/60   Weight: 193 lb 6.4 oz (87.7 kg)   Height: 5\' 11"  (1.803 m)   PainSc:  1    Body mass index is 26.97 kg/m.     06/05/2023   11:12 AM 05/31/2022   10:43 AM 09/11/2021   11:58 AM 03/31/2021    9:33 AM 05/25/2020    9:09 AM 03/30/2019    2:48 PM 03/28/2018    2:23 PM  Advanced Directives  Does Patient Have a Medical Advance Directive? Yes Yes Yes Yes Yes Yes Yes  Type of Estate agent of [place];Living will Healthcare Power of [place];Living will Healthcare Power of [place];Living will Healthcare Power of [place];Living will Healthcare Power of [place];Living will Healthcare Power of [place];Living will Healthcare Power of [place];Living will  Does patient want to make changes to medical advance directive?   No - Patient declined No - Patient declined     Copy of Healthcare Power of Attorney in Chart? Yes - validated most recent copy scanned in chart (See row information) No - copy requested  No - copy requested No - copy requested No - copy requested No - copy requested    Current Medications  (verified) Outpatient Encounter Medications as of 06/05/2023  Medication Sig   amLODipine (NORVASC) 5 MG tablet TAKE ONE TABLET BY MOUTH ONCE A DAY   atorvastatin (LIPITOR) 40 MG tablet TAKE ONE TABLET BY MOUTH ONCE DAILY   buPROPion (WELLBUTRIN XL) 150 MG 24 hr tablet Take 1 tablet (150 mg total) by mouth daily.   CALCIUM-VITAMIN D PO Take 2 tablets by mouth daily.   Continuous Blood Gluc Receiver (FREESTYLE LIBRE 2 READER) DEVI 1 each by Does not apply route daily.   Continuous Glucose Sensor (FREESTYLE LIBRE 2 SENSOR) MISC APPLY ONE SENSOR EVERY 14 DAYS.   cyanocobalamin (VITAMIN B12) 1000 MCG tablet Take 1,000 mcg by mouth daily.   darolutamide (NUBEQA) 300 MG tablet Take 600 mg by mouth 2 (two) times daily with a meal.   degarelix (FIRMAGON) 80 MG injection Inject 80 mg into the skin every 28 (twenty-eight) days.   gabapentin (NEURONTIN) 300 MG capsule Take 300 mg by mouth at bedtime.   glipiZIDE (GLUCOTROL XL) 10 MG 24 hr tablet Take 1 tablet (10 mg total) by mouth daily with breakfast.   imipramine (TOFRANIL) 10 MG tablet Take 4 tablets (40 mg total) by mouth at bedtime.   insulin glargine (LANTUS SOLOSTAR) 100 UNIT/ML Solostar Pen Inject 20 Units into the skin daily.   insulin glulisine (APIDRA) 100 UNIT/ML Solostar Pen Inject 2 Units into the skin daily with lunch.   Insulin Pen Needle (PEN NEEDLES) 32G X 4 MM MISC 1 Device by Other route daily in the afternoon.  Use to inject insulin daily   ketoconazole (NIZORAL) 2 % cream Apply 1 application  topically daily as needed for irritation.   losartan (COZAAR) 100 MG tablet TAKE ONE TABLET BY MOUTH ONCE DAILY   omeprazole (PRILOSEC) 40 MG capsule Take 40 mg by mouth daily.   OVER THE COUNTER MEDICATION Regaloid-Level teaspoon mix with water daily or every other day   No facility-administered encounter medications on file as of 06/05/2023.    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, patient slurs at least some words and, at worst, can be understood with some difficulty 1980   Osteoporosis    Prostate cancer [facility])    Past Surgical History:  Procedure Laterality Date   ACL Tear  03/12/1978   PROSTATE BIOPSY     TONSILLECTOMY  03/13/1947   VASECTOMY     Family History  Problem Relation Age of Onset   Cerebral aneurysm Mother    Diabetes Father    Bradycardia Father    Benign prostatic hyperplasia Father    Supraventricular tachycardia Brother    Prostate cancer Brother    Supraventricular tachycardia Brother    Cancer Cousin        maternal; unknown type, dx late 94s   Esophageal cancer Neg Hx    Stomach cancer Neg Hx    Rectal cancer Neg Hx    Social History   Socioeconomic History   Marital status: Married    Spouse name: Not on file   Number of children: 2   Years of education: Not on file   Highest education level: Not on file  Occupational History   Occupation: Retired  Tobacco Use   Smoking status: Former    Current packs/day: 0.00    Average packs/day: 2.0 packs/day for 10.0 years (20.0 ttl pk-yrs)    Types: Cigarettes    Start date: 03/13/1955    Quit date: 03/12/1965    Years since quitting: 58.2   Smokeless tobacco: Never  Vaping Use   Vaping status: Never Used  Substance and Sexual Activity   Alcohol use: Yes    Alcohol/week: 5.0 standard drinks of alcohol    Types: 2 Glasses of wine, 3 Cans of beer per week   Drug use: No   Sexual activity: Never  Other Topics Concern   Not on file  Social History Narrative   Exercising: 3-4 times a week.   Healthy eating habits.   Full Code   Has a living will, has HCPOA: Therapist, nutritional (reviewed 2015)   Social Drivers of Health   Financial Resource Strain: Low Risk   (06/05/2023)   Overall Financial Resource Strain (CARDIA)    Difficulty of Paying Living Expenses: Not hard at all  Food Insecurity: No Food Insecurity (06/05/2023)   Hunger Vital Sign    Worried About Running Out of Food in the Last Year: Never true    Ran Out of Food in the Last Year: Never true  Transportation Needs: No Transportation Needs (06/05/2023)   PRAPARE - Administrator, Civil Service (Medical): No    Lack of Transportation (Non-Medical): No  Physical Activity: Insufficiently Active (06/05/2023)   Exercise Vital Sign    Days of Exercise per Week: 3 days  Minutes of Exercise per Session: 40 min  Stress: No Stress Concern Present (06/05/2023)   [employer] of Occupational Health - Occupational Stress Questionnaire    Feeling of Stress : Not at all  Social Connections: Moderately Isolated (06/05/2023)   Social Connection and Isolation Panel [NHANES]    Frequency of Communication with Friends and Family: More than three times a week    Frequency of Social Gatherings with Friends and Family: More than three times a week    Attends Religious Services: Never    Database administrator or Organizations: No    Attends Engineer, structural: Never    Marital Status: Married    Tobacco Counseling Counseling given: Not Answered    Clinical Intake:  Pre-visit preparation completed: Yes  Pain : 0-10 Pain Score: 1  Pain Type: Chronic pain Pain Location: Abdomen (tummy ache every day) Pain Descriptors / Indicators: Aching Pain Onset: More than a month ago Pain Frequency: Intermittent Pain Relieving Factors: burping sometimes  Pain Relieving Factors: burping sometimes  BMI - recorded: 26.97 Nutritional Status: BMI 25 -29 Overweight Nutritional Risks: None Diabetes: Yes CBG done?: Yes (BS 108 this am at home) CBG resulted in Enter/ Edit results?: No Did pt. bring in CBG monitor from home?: No  Lab Results  Component Value Date   HGBA1C 7.2 (A)  03/26/2023   HGBA1C 8.0 (A) 12/20/2022   HGBA1C 7.3 (H) 06/07/2022     How often do you need to have someone help you when you read instructions, pamphlets, or other written materials from your doctor or pharmacy?: 1 - Never  Interpreter Needed?: No  Comments: lives with wife Information entered by :: B.Ranell Skibinski,LPN   Activities of Daily Living     06/05/2023   11:13 AM  In your present state of health, do you have any difficulty performing the following activities:  Hearing? 0  Vision? 0  Difficulty concentrating or making decisions? 0  Walking or climbing stairs? 0  Dressing or bathing? 0  Doing errands, shopping? 0  Preparing Food and eating ? N  Using the Toilet? N  In the past six months, have you accidently leaked urine? N  Do you have problems with loss of bowel control? N  Managing your Medications? N  Managing your Finances? N  Housekeeping or managing your Housekeeping? N    Patient Care Team: Excell Seltzer, MD as PCP - General (Family Medicine) Benjiman Core, MD (Inactive) as Consulting Physician (Oncology) Theda Belfast, OD as Referring Physician (Optometry)  Indicate any recent Medical Services you may have received from other than [facility] providers in the past year (date may be approximate).     Assessment:   This is a routine wellness examination for Jahrel.  Hearing/Vision screen Hearing Screening - Comments:: Pt says his hearing is great;little tinnitus Vision Screening - Comments:: Pt says his vision is good w/glasses Dr Joycelyn Das   Goals Addressed             This Visit's Progress    Increase physical activity   On track    06/05/23- I will continue to exercise for at least 30 minutes 4 days per week.      Patient Stated   On track    06/05/2023, I will continue to go to the gym 2 days a week for 1 hour.        Depression Screen     06/05/2023   11:08 AM 03/26/2023   11:25 AM 12/20/2022  10:28 AM 06/20/2022    9:41 AM 05/31/2022    10:41 AM 09/11/2021   10:26 AM 05/25/2020    9:11 AM  PHQ 2/9 Scores  PHQ - 2 Score 0 6 6 0 0 6 0  PHQ- 9 Score  27 12 0  16 0    Fall Risk     06/05/2023   11:04 AM 12/20/2022   10:27 AM 06/20/2022    9:41 AM 05/31/2022   10:34 AM 09/11/2021   10:25 AM  Fall Risk   Falls in the past year?  0 1 1 1   Number falls in past yr: 0 0 0 0 0  Comment    tripped over limb in yard   Injury with Fall? 0 1 1 1 1   Comment    Broken collarbone broke collar bone  Risk for fall due to : No Fall Risks No Fall Risks History of fall(s) No Fall Risks   Follow up Education provided;Falls prevention discussed Falls evaluation completed Falls evaluation completed;Falls prevention discussed Falls prevention discussed;Falls evaluation completed;Education provided     MEDICARE RISK AT HOME:  Medicare Risk at Home Any stairs in or around the home?: Yes If so, are there any without handrails?: Yes Home free of loose throw rugs in walkways, pet beds, electrical cords, etc?: Yes Adequate lighting in your home to reduce risk of falls?: Yes Life alert?: No Use of a cane, walker or w/c?: No Grab bars in the bathroom?: Yes Shower chair or bench in shower?: No Elevated toilet seat or a handicapped toilet?: Yes  TIMED UP AND GO:  Was the test performed?  Yes  Length of time to ambulate 10 feet: 10 sec Gait steady and fast without use of assistive device  Cognitive Function: 6CIT completed    05/25/2020    9:13 AM 03/30/2019    2:52 PM 03/28/2018    2:23 PM 03/26/2017   12:32 PM 09/06/2015    8:53 AM  MMSE - Mini Mental State Exam  Orientation to time 5 5 5 5 5   Orientation to Place 5 5 5 5 5   Registration 3 3 3 3 3   Attention/ Calculation 5 5 0 0 0  Recall 3 3 3 3 3   Language- name 2 objects   0 0 0  Language- repeat 1 1 1 1 1   Language- follow 3 step command   3 3 3   Language- read & follow direction   0 0 0  Write a sentence   0 0 0  Copy design   0 0 0  Total score   20 20 20         06/05/2023    11:14 AM 05/31/2022   10:45 AM  6CIT Screen  What Year? 0 points 0 points  What month? 0 points 0 points  What time? 0 points 0 points  Count back from 20 0 points 0 points  Months in reverse 0 points 0 points  Repeat phrase 0 points 0 points  Total Score 0 points 0 points    Immunizations Immunization History  Administered Date(s) Administered   Fluad Quad(high Dose 65+) 11/13/2018, 11/23/2021   Influenza Whole 01/10/2007, 12/09/2007, 12/14/2008   Influenza, High Dose Seasonal PF 12/11/2013, 12/17/2014, 12/02/2015, 12/02/2015, 12/04/2017, 10/30/2019, 12/09/2020, 11/28/2022   Influenza, Seasonal, Injecte, Preservative Fre 03/23/1999   Influenza-Unspecified 12/10/2012, 12/10/2016, 11/22/2020, 11/23/2021   Moderna Covid-19 Fall Seasonal Vaccine 71yrs & older 12/01/2021   PFIZER Comirnaty(Gray Top)Covid-19 Tri-Sucrose Vaccine 06/21/2020   PFIZER(Purple  Top)SARS-COV-2 Vaccination 04/03/2019, 04/23/2019, 10/30/2019   Pfizer Covid-19 Vaccine Bivalent Booster 64yrs & up 12/09/2020, 08/01/2021   Pfizer(Comirnaty)Fall Seasonal Vaccine 12 years and older 11/28/2022   Pneumococcal Conjugate-13 08/11/2013   Pneumococcal Polysaccharide-23 03/26/2007   Respiratory Syncytial Virus Vaccine,Recomb Aduvanted(Arexvy) 12/01/2021   Rsv, Bivalent, Protein Subunit Rsvpref,pf Verdis Frederickson) 11/10/2021   Td 03/26/2007   Zoster Recombinant(Shingrix) 10/22/2019, 06/08/2020   Zoster, Live 12/03/2007    Screening Tests Health Maintenance  Topic Date Due   DTaP/Tdap/Td (2 - Tdap) 03/25/2017   COVID-19 Vaccine (9 - Pfizer risk 2024-25 season) 05/28/2023   FOOT EXAM  05/24/2023   Diabetic kidney evaluation - Urine ACR  06/07/2023   HEMOGLOBIN A1C  09/23/2023   Diabetic kidney evaluation - eGFR measurement  12/20/2023   OPHTHALMOLOGY EXAM  01/02/2024   Medicare Annual Wellness (AWV)  06/04/2024   Pneumonia Vaccine 46+ Years old  Completed   INFLUENZA VACCINE  Completed   Zoster Vaccines- Shingrix  Completed    HPV VACCINES  Aged Out   Hepatitis C Screening  Discontinued    Health Maintenance  Health Maintenance Due  Topic Date Due   DTaP/Tdap/Td (2 - Tdap) 03/25/2017   COVID-19 Vaccine (9 - Pfizer risk 2024-25 season) 05/28/2023   FOOT EXAM  05/24/2023   Diabetic kidney evaluation - Urine ACR  06/07/2023   Health Maintenance Items Addressed:  Additional Screening:  Vision Screening: Recommended annual ophthalmology exams for early detection of glaucoma and other disorders of the eye.  Dental Screening: Recommended annual dental exams for proper oral hygiene  Community Resource Referral / Chronic Care Management: CRR required this visit?  No   CCM required this visit?  No     Plan:     I have personally reviewed and noted the following in the patient's chart:   Medical and social history Use of alcohol, tobacco or illicit drugs  Current medications and supplements including opioid prescriptions. Patient is not currently taking opioid prescriptions. Functional ability and status Nutritional status Physical activity Advanced directives List of other physicians Hospitalizations, surgeries, and ER visits in previous 12 months Vitals Screenings to include cognitive, depression, and falls Referrals and appointments  In addition, I have reviewed and discussed with patient certain preventive protocols, quality metrics, and best practice recommendations. A written personalized care plan for preventive services as well as general preventive health recommendations were provided to patient.    Sue Lush, LPN   1/61/0960   After Visit Summary: (MyChart) Due to this being a telephonic visit, the after visit summary with patients personalized plan was offered to patient via MyChart   Notes: Please refer to Routing Comments. Pt says he has been experiencing lightheadedness for 2 weeks; says it dissipates when he sits down; lasts for about if he cannot sit down. He  wonders about afib.testing (does he need). He declined appt at this time (has appt w/you 06/26/23). I encouraged him to call for appt if persists beyond this week, worsens or other sx come into play and not wait until Aprils appt. Pt indicates he will.

## 2023-06-13 NOTE — Progress Notes (Signed)
 06/05/23 was a in person visit. Please disregard televisit notes in visit.BBordeaux,LPN

## 2023-06-19 ENCOUNTER — Encounter: Payer: Self-pay | Admitting: Family Medicine

## 2023-06-19 ENCOUNTER — Telehealth: Payer: Self-pay | Admitting: Family Medicine

## 2023-06-19 ENCOUNTER — Other Ambulatory Visit (INDEPENDENT_AMBULATORY_CARE_PROVIDER_SITE_OTHER): Payer: Medicare Other

## 2023-06-19 DIAGNOSIS — E1169 Type 2 diabetes mellitus with other specified complication: Secondary | ICD-10-CM | POA: Diagnosis not present

## 2023-06-19 DIAGNOSIS — E1140 Type 2 diabetes mellitus with diabetic neuropathy, unspecified: Secondary | ICD-10-CM

## 2023-06-19 DIAGNOSIS — E785 Hyperlipidemia, unspecified: Secondary | ICD-10-CM | POA: Diagnosis not present

## 2023-06-19 LAB — LIPID PANEL
Cholesterol: 117 mg/dL (ref 0–200)
HDL: 42.7 mg/dL (ref >39.00)
LDL Cholesterol: 62 mg/dL (ref 0–99)
NonHDL: 73.85
Total CHOL/HDL Ratio: 3
Triglycerides: 59 mg/dL (ref 0.0–149.0)
VLDL: 11.8 mg/dL (ref 0.0–40.0)

## 2023-06-19 LAB — COMPREHENSIVE METABOLIC PANEL WITH GFR
ALT: 17 U/L (ref 0–53)
AST: 15 U/L (ref 0–37)
Albumin: 4.1 g/dL (ref 3.5–5.2)
Alkaline Phosphatase: 39 U/L (ref 39–117)
BUN: 28 mg/dL — ABNORMAL HIGH (ref 6–23)
CO2: 28 meq/L (ref 19–32)
Calcium: 9.2 mg/dL (ref 8.4–10.5)
Chloride: 108 meq/L (ref 96–112)
Creatinine, Ser: 0.99 mg/dL (ref 0.40–1.50)
GFR: 71.01 mL/min (ref >60.00)
Glucose, Bld: 106 mg/dL — ABNORMAL HIGH (ref 70–99)
Potassium: 4 meq/L (ref 3.5–5.1)
Sodium: 143 meq/L (ref 135–145)
Total Bilirubin: 0.6 mg/dL (ref 0.2–1.2)
Total Protein: 6.4 g/dL (ref 6.0–8.3)

## 2023-06-19 LAB — MICROALBUMIN / CREATININE URINE RATIO
Creatinine,U: 114.2 mg/dL
Microalb Creat Ratio: 25.5 mg/g (ref 0.0–30.0)
Microalb, Ur: 2.9 mg/dL — ABNORMAL HIGH (ref 0.0–1.9)

## 2023-06-19 LAB — HEMOGLOBIN A1C: Hgb A1c MFr Bld: 6.9 % — ABNORMAL HIGH (ref 4.6–6.5)

## 2023-06-19 NOTE — Telephone Encounter (Signed)
 Copied from CRM [id number] [id number]. Topic: Referral - Question >> Jun 19, 2023  2:04 PM Deaijah H wrote: Reason for CRM: Patient would like know to if he could be referred to [facility] for Genetics due to previous referral not answering multiple attempts and also mailbox being full. Please call [phone] or [phone] to discuss further

## 2023-06-19 NOTE — Progress Notes (Signed)
 No critical labs need to be addressed urgently. We will discuss labs in detail at upcoming office visit.

## 2023-06-25 ENCOUNTER — Encounter: Payer: Self-pay | Admitting: Family Medicine

## 2023-06-26 ENCOUNTER — Encounter: Payer: Medicare Other | Admitting: Family Medicine

## 2023-06-27 NOTE — Telephone Encounter (Signed)
 I attempted to call the [facility] dept yesterday afternoon and again today and was not able to get anyone on the phone.   We can attempt one more time to reach [facility] or reroute referral to [facility].

## 2023-07-10 ENCOUNTER — Ambulatory Visit (INDEPENDENT_AMBULATORY_CARE_PROVIDER_SITE_OTHER): Admitting: Family Medicine

## 2023-07-10 ENCOUNTER — Encounter: Payer: Self-pay | Admitting: Family Medicine

## 2023-07-10 VITALS — BP 120/79 | HR 90 | Temp 97.9°F | Ht 71.0 in | Wt 194.2 lb

## 2023-07-10 DIAGNOSIS — E1159 Type 2 diabetes mellitus with other circulatory complications: Secondary | ICD-10-CM | POA: Diagnosis not present

## 2023-07-10 DIAGNOSIS — R109 Unspecified abdominal pain: Secondary | ICD-10-CM

## 2023-07-10 DIAGNOSIS — G8929 Other chronic pain: Secondary | ICD-10-CM

## 2023-07-10 DIAGNOSIS — E1142 Type 2 diabetes mellitus with diabetic polyneuropathy: Secondary | ICD-10-CM

## 2023-07-10 DIAGNOSIS — C61 Malignant neoplasm of prostate: Secondary | ICD-10-CM

## 2023-07-10 DIAGNOSIS — I700 Atherosclerosis of aorta: Secondary | ICD-10-CM

## 2023-07-10 DIAGNOSIS — I152 Hypertension secondary to endocrine disorders: Secondary | ICD-10-CM

## 2023-07-10 DIAGNOSIS — E1169 Type 2 diabetes mellitus with other specified complication: Secondary | ICD-10-CM

## 2023-07-10 DIAGNOSIS — Z0000 Encounter for general adult medical examination without abnormal findings: Secondary | ICD-10-CM | POA: Diagnosis not present

## 2023-07-10 DIAGNOSIS — E1140 Type 2 diabetes mellitus with diabetic neuropathy, unspecified: Secondary | ICD-10-CM

## 2023-07-10 DIAGNOSIS — E785 Hyperlipidemia, unspecified: Secondary | ICD-10-CM

## 2023-07-10 DIAGNOSIS — F321 Major depressive disorder, single episode, moderate: Secondary | ICD-10-CM

## 2023-07-10 LAB — HM DIABETES FOOT EXAM

## 2023-07-10 NOTE — Assessment & Plan Note (Signed)
Stable, chronic.  Continue current medication.   amlodipine 5 mg daily losartan 100 mg daily.

## 2023-07-10 NOTE — Assessment & Plan Note (Signed)
 Chronic, has good and bad days.  Has discomfort 40-50% of the week.

## 2023-07-10 NOTE — Assessment & Plan Note (Signed)
 Chronic, inadequate control   He does feel imipramine  and gabapentin are helping with sleep On Wellbutrin  XL 150 mg p.o. daily.... this has helped with self destructive though, and depression.... Has a more positive outlook about abdominal pain.

## 2023-07-10 NOTE — Assessment & Plan Note (Signed)
 On statin. LDL goal < 70.

## 2023-07-10 NOTE — Assessment & Plan Note (Signed)
 Chronic, likely contributing to change in gait that his wife has noted.  No current pain.   Can try ALA (omega-3 fatty acid)  [phone] mg daily dose to see if it helps with peripheral neuropathy.

## 2023-07-10 NOTE — Progress Notes (Signed)
 Patient ID: Alexander Yang, male    DOB: Oct 29, 1940, 83 y.o.   MRN: [id number]  This visit was conducted in person.  BP 120/79   Pulse 90   Temp 97.9 F (36.6 C) (Oral)   Ht 5\' 11"  (1.803 m)   Wt 194 lb 3.2 oz (88.1 kg)   SpO2 96%   BMI 27.09 kg/m    CC:  Chief Complaint  Patient presents with   Annual Exam    Subjective:   HPI: Alexander Yang is a 83 y.o. male presenting on 07/10/2023 for Annual Exam  The patient presents for complete physical and review of chronic health problems. He/She also has the following acute concerns today: none  The patient saw a LPN or RN for medicare wellness visit.   Prevention and wellness was reviewed in detail. Note reviewed and important notes copied below.   Flowsheet Row Clinical Support from 06/05/2023 in [facility]  PHQ-2 Total Score 0        Diabetes:  Good control, on glipizide  XL and lantus  25 units .  low-dose fast acting insulin  Apidra  prior to his meal per Sliding scale ( mpst he uses is 10 units) Farxiga  not tolerated given nausea.  Stopped metformin  due to possible GI irritation. SE to trulicity  in past, severe. Lab Results  Component Value Date   HGBA1C 6.9 (H) 06/19/2023  Using medications without difficulties: Hypoglycemic episodes:none Hyperglycemic episodes:  occ Feet problems: no ulcers Blood Sugars averaging: On Freestyle Libre 69% of time in target, 30 % above goal. eye exam within last year:  Has  CGM  GI issues:   Epigastric ache, no heartburn.  Feels like an ache more than a pain, feels like pressure, feeling like he has to belch.. feels better for awhile after burping.  It has effected his quality of life... he doesn't want to eat.  Mild nausea, normal BMs ( occ once every 3 weeks explosive diarrhea)  No blood in  stool.  Hypertension:    Good control on amlodipine  5 mg daily losartan  100 mg daily. BP Readings from Last 3 Encounters:  07/10/23 120/79  06/05/23  110/60  03/26/23 112/80  Using medication without problems or lightheadedness:  none Chest pain with exertion:none Edema: none Short of breath: none Average home BPs: Other issues: Wt Readings from Last 3 Encounters:  07/10/23 194 lb 3.2 oz (88.1 kg)  06/05/23 193 lb 6.4 oz (87.7 kg)  03/26/23 193 lb 2 oz (87.6 kg)     Prostate cancer:  On Elegaurd ( had changed back from Firmagon, has been having dizziness so plans to return to Firmagon) and dalutomide. PSA almost undetectable  Followed by URO and ONC.  On active treatment.   Elevated Cholesterol: LDL goal off atorvastatin  40 mg daily ( had no benefit  in abd pain off for 2 weeks) Has now restarted it. Lab Results  Component Value Date   CHOL 117 06/19/2023   HDL 42.70 06/19/2023   LDLCALC 62 06/19/2023   TRIG 59.0 06/19/2023   CHOLHDL 3 06/19/2023  Using medications without problems: Muscle aches:  Diet compliance: moderate Exercise: walking Other complaints:  Wt Readings from Last 3 Encounters:  07/10/23 194 lb 3.2 oz (88.1 kg)  06/05/23 193 lb 6.4 oz (87.7 kg)  03/26/23 193 lb 2 oz (87.6 kg)    Relevant past medical, surgical, family and social history reviewed and updated as indicated. Interim medical history since our last visit reviewed. Allergies  and medications reviewed and updated. Outpatient Medications Prior to Visit  Medication Sig Dispense Refill   amLODipine  (NORVASC ) 5 MG tablet TAKE ONE TABLET BY MOUTH ONCE A DAY 90 tablet 3   atorvastatin  (LIPITOR) 40 MG tablet TAKE ONE TABLET BY MOUTH ONCE DAILY 90 tablet 3   buPROPion  (WELLBUTRIN  XL) 150 MG 24 hr tablet Take 1 tablet (150 mg total) by mouth daily. 30 tablet 3   CALCIUM -VITAMIN D  PO Take 2 tablets by mouth daily.     Continuous Blood Gluc Receiver (FREESTYLE LIBRE 2 READER) DEVI 1 each by Does not apply route daily. 1 each 2   Continuous Glucose Sensor (FREESTYLE LIBRE 2 SENSOR) MISC APPLY ONE SENSOR EVERY 14 DAYS. 2 each 11   cyanocobalamin   (VITAMIN B12) 1000 MCG tablet Take 1,000 mcg by mouth daily.     darolutamide (NUBEQA) 300 MG tablet Take 600 mg by mouth 2 (two) times daily with a meal.     gabapentin (NEURONTIN) 300 MG capsule Take 300 mg by mouth at bedtime.     glipiZIDE  (GLUCOTROL  XL) 10 MG 24 hr tablet Take 1 tablet (10 mg total) by mouth daily with breakfast. 90 tablet 3   imipramine  (TOFRANIL ) 10 MG tablet Take 4 tablets (40 mg total) by mouth at bedtime. 28 tablet 0   insulin  glargine (LANTUS  SOLOSTAR) 100 UNIT/ML Solostar Pen Inject 20 Units into the skin daily. 30 mL 3   insulin  glulisine (APIDRA ) 100 UNIT/ML Solostar Pen Inject 2 Units into the skin daily with lunch. 15 mL 3   Insulin  Pen Needle (PEN NEEDLES) 32G X 4 MM MISC 1 Device by Other route daily in the afternoon. Use to inject insulin  daily 100 each 3   ketoconazole  (NIZORAL ) 2 % cream Apply 1 application  topically daily as needed for irritation.     losartan  (COZAAR ) 100 MG tablet TAKE ONE TABLET BY MOUTH ONCE DAILY 90 tablet 3   omeprazole (PRILOSEC) 40 MG capsule Take 40 mg by mouth daily.     OVER THE COUNTER MEDICATION Regaloid-Level teaspoon mix with water daily or every other day     degarelix (FIRMAGON) 80 MG injection Inject 80 mg into the skin every 28 (twenty-eight) days. (Patient not taking: Reported on 07/10/2023)     No facility-administered medications prior to visit.     Per HPI unless specifically indicated in ROS section below Review of Systems  Constitutional:  Negative for fatigue and fever.  HENT:  Negative for ear pain.   Eyes:  Negative for pain.  Respiratory:  Negative for cough and shortness of breath.   Cardiovascular:  Negative for chest pain, palpitations and leg swelling.  Gastrointestinal:  Positive for abdominal distention. Negative for abdominal pain.  Genitourinary:  Negative for dysuria.  Musculoskeletal:  Negative for arthralgias.  Neurological:  Negative for syncope, light-headedness and headaches.   Psychiatric/Behavioral:  Negative for dysphoric mood.    Objective:  BP 120/79   Pulse 90   Temp 97.9 F (36.6 C) (Oral)   Ht 5\' 11"  (1.803 m)   Wt 194 lb 3.2 oz (88.1 kg)   SpO2 96%   BMI 27.09 kg/m   Wt Readings from Last 3 Encounters:  07/10/23 194 lb 3.2 oz (88.1 kg)  06/05/23 193 lb 6.4 oz (87.7 kg)  03/26/23 193 lb 2 oz (87.6 kg)      Physical Exam Constitutional:      General: He is not in acute distress.    Appearance: Normal appearance. He is  well-developed. He is not ill-appearing or toxic-appearing.  HENT:     Head: Normocephalic and atraumatic.     Right Ear: Hearing, tympanic membrane, ear canal and external ear normal.     Left Ear: Hearing, tympanic membrane, ear canal and external ear normal.     Nose: Nose normal.     Mouth/Throat:     Pharynx: Uvula midline.  Eyes:     General: Lids are normal. Lids are everted, no foreign bodies appreciated.     Conjunctiva/sclera: Conjunctivae normal.     Pupils: Pupils are equal, round, and reactive to light.  Neck:     Thyroid : No thyroid  mass or thyromegaly.     Vascular: No carotid bruit.     Trachea: Trachea and phonation normal.  Cardiovascular:     Rate and Rhythm: Normal rate and regular rhythm.     Pulses: Normal pulses.     Heart sounds: S1 normal and S2 normal. No murmur heard.    No gallop.  Pulmonary:     Breath sounds: Normal breath sounds. No wheezing, rhonchi or rales.  Abdominal:     General: Bowel sounds are normal.     Palpations: Abdomen is soft.     Tenderness: There is no abdominal tenderness. There is no guarding or rebound.     Hernia: No hernia is present.  Musculoskeletal:     Cervical back: Normal range of motion and neck supple.  Lymphadenopathy:     Cervical: No cervical adenopathy.  Skin:    General: Skin is warm and dry.     Findings: No rash.  Neurological:     Mental Status: He is alert.     Cranial Nerves: No cranial nerve deficit.     Sensory: No sensory deficit.      Gait: Gait normal.     Deep Tendon Reflexes: Reflexes are normal and symmetric.  Psychiatric:        Speech: Speech normal.        Behavior: Behavior normal.        Judgment: Judgment normal.    Diabetic foot exam: Normal inspection No skin breakdown  Great to and heel calluses  Normal DP pulses Normal sensation to light touch and monofilament Nails normal     Results for orders placed or performed in visit on 07/10/23  HM DIABETES FOOT EXAM   Collection Time: 07/10/23 12:00 AM  Result Value Ref Range   HM Diabetic Foot Exam done     This visit occurred during the SARS-CoV-2 public health emergency.  Safety protocols were in place, including screening questions prior to the visit, additional usage of staff PPE, and extensive cleaning of exam room while observing appropriate contact time as indicated for disinfecting solutions.   COVID 19 screen:  No recent travel or known exposure to COVID19 The patient denies respiratory symptoms of COVID 19 at this time. The importance of social distancing was discussed today.   Assessment and Plan   The patient's preventative maintenance and recommended screening tests for an annual wellness exam were reviewed in full today. Brought up to date unless services declined.  Counselled on the importance of diet, exercise, and its role in overall health and mortality. The patient's FH and SH was reviewed, including their home life, tobacco status, and drug and alcohol status.   Vaccines: uptodate, consider Tdap, COVID x 4 Colon:  2019 negative, no further indicated   Prostate cancer:  followed by [facility]/ONC  Sees Dermatologist.  Problem List  Items Addressed This Visit     Aortic atherosclerosis (HCC) (Chronic)   On statin. LDL goal < 70.      Chronic abdominal pain    Chronic, has good and bad days.  Has discomfort 40-50% of the week.      Current moderate episode of major depressive disorder without prior episode (HCC)   Chronic,  inadequate control   He does feel imipramine  and gabapentin are helping with sleep On Wellbutrin  XL 150 mg p.o. daily.... this has helped with self destructive though, and depression.... Has a more positive outlook about abdominal pain.       Diabetes mellitus with neurological manifestations, controlled (HCC) (Chronic)   Chronic, improved control  He is using glargine insulin  25 Units units in the morning as well as Glucotrol  XL max dose. low-dose fast acting insulin  Apidra  prior to his meal per Sliding scale.  Encouraged high-protein high-fiber bedtime snack.      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  No current pain.   Can try ALA (omega-3 fatty acid)  [phone] mg daily dose to see if it helps with peripheral neuropathy.      Hyperlipidemia associated with type 2 diabetes mellitus (HCC) (Chronic)   Stable,chronic, at goal.    Atorvastatin  40 mg once daily      Hypertension associated with diabetes (HCC) (Chronic)   Stable, chronic.  Continue current medication.   amlodipine  5 mg daily losartan  100 mg daily.      Prostate cancer (HCC) (Chronic)    Chronic, active treatment      Other Visit Diagnoses       Routine general medical examination at a health care facility    -  Primary        Alexander Lolling, MD

## 2023-07-10 NOTE — Assessment & Plan Note (Signed)
 Stable,chronic, at goal.    Atorvastatin  40 mg once daily

## 2023-07-10 NOTE — Assessment & Plan Note (Addendum)
 Chronic, improved control  He is using glargine insulin  25 Units units in the morning as well as Glucotrol  XL max dose. low-dose fast acting insulin  Apidra  prior to his meal per Sliding scale.  Encouraged high-protein high-fiber bedtime snack.

## 2023-07-10 NOTE — Assessment & Plan Note (Signed)
Chronic, active treatment

## 2023-07-23 NOTE — Telephone Encounter (Signed)
 See referral notes and also Mychart messages. The patient requested that everything be faxed over to [facility] since he has not been able to get in with [facility].

## 2023-07-27 ENCOUNTER — Other Ambulatory Visit: Payer: Self-pay | Admitting: Family Medicine

## 2023-07-29 ENCOUNTER — Telehealth: Payer: Self-pay | Admitting: Family Medicine

## 2023-07-29 NOTE — Telephone Encounter (Unsigned)
 Copied from CRM [id number]. Topic: Referral - Question >> Jul 29, 2023  3:42 PM Danae Duncans wrote: Reason for CRM: [facility] wanted more clarification on what the genetic testing for - can be reached at [phone]

## 2023-08-12 ENCOUNTER — Other Ambulatory Visit: Payer: Self-pay | Admitting: Internal Medicine

## 2023-08-16 ENCOUNTER — Other Ambulatory Visit: Payer: Self-pay | Admitting: Internal Medicine

## 2023-09-18 ENCOUNTER — Telehealth: Payer: Self-pay

## 2023-09-18 ENCOUNTER — Other Ambulatory Visit: Payer: Self-pay | Admitting: Family Medicine

## 2023-09-18 ENCOUNTER — Other Ambulatory Visit: Payer: Self-pay | Admitting: Internal Medicine

## 2023-09-18 NOTE — Telephone Encounter (Signed)
Contact patient for follow up

## 2023-09-22 ENCOUNTER — Encounter: Payer: Self-pay | Admitting: Family Medicine

## 2023-09-22 DIAGNOSIS — E1140 Type 2 diabetes mellitus with diabetic neuropathy, unspecified: Secondary | ICD-10-CM

## 2023-09-23 MED ORDER — FREESTYLE LIBRE 3 READER DEVI: 0 refills | Status: AC

## 2023-09-23 MED ORDER — FREESTYLE LIBRE 3 PLUS SENSOR MISC: 11 refills | Status: AC

## 2023-11-14 ENCOUNTER — Encounter: Payer: Self-pay | Admitting: Family Medicine

## 2023-11-18 ENCOUNTER — Ambulatory Visit (INDEPENDENT_AMBULATORY_CARE_PROVIDER_SITE_OTHER)

## 2023-11-18 ENCOUNTER — Ambulatory Visit: Admission: EM | Admit: 2023-11-18 | Discharge: 2023-11-18 | Disposition: A | Discharge status: Home / Self Care

## 2023-11-18 ENCOUNTER — Encounter: Payer: Self-pay | Admitting: Emergency Medicine

## 2023-11-18 ENCOUNTER — Encounter: Payer: Self-pay | Admitting: Family Medicine

## 2023-11-18 DIAGNOSIS — M1732 Unilateral post-traumatic osteoarthritis, left knee: Secondary | ICD-10-CM | POA: Diagnosis not present

## 2023-11-18 DIAGNOSIS — M25562 Pain in left knee: Secondary | ICD-10-CM

## 2023-11-18 DIAGNOSIS — S0990XA Unspecified injury of head, initial encounter: Secondary | ICD-10-CM | POA: Diagnosis not present

## 2023-11-18 MED ORDER — MELOXICAM 7.5 MG PO TABS: 7.5000 mg | ORAL_TABLET | Freq: Every day | ORAL | 0 refills | Status: DC

## 2023-11-18 NOTE — Telephone Encounter (Signed)
 Pt said last few days having episodes of vertigo where room spins around. On 11/13/23 felt no dizziness but pt felt himself falling on cement pad; did not trip on anything and pt is not sure why fell. Pt still having lt knee pain from fall on 11/13/23. Hurts worse when bears weight on knee. Walking with cane.Pt is going to [employer] today. Sending note to [employer] [facility] [employer] [facility].

## 2023-11-18 NOTE — ED Triage Notes (Signed)
 Patient reports he fell and injured his left knee . Redness noted to left knee. Patient also has bruise to right side forehead. Patient denies pain while sitting. He said his knee only hurts when he walks on it.

## 2023-11-18 NOTE — Discharge Instructions (Addendum)
  1. Closed head injury, initial encounter (Primary) - Continue to monitor for any change in mental status, blurred vision, severe headache, lethargy, loss of coordination as these may be signs of intracranial injury. - Usually manifestation of the symptoms occurs within 12 to 24 hours of initial head injury. - If any change in symptoms or escalation of current symptoms follow-up in emergency department for further evaluation and treatment.  2. Post-traumatic osteoarthritis of left knee - DG Knee Complete 4 Views Left x-ray performed in UC shows no acute fracture or dislocation, moderate tricompartmental osteoarthritis noted to the left knee. - meloxicam  (MOBIC ) 7.5 MG tablet; Take 1 tablet (7.5 mg total) by mouth daily.  Dispense: 10 tablet; Refill: 0

## 2023-11-18 NOTE — ED Provider Notes (Signed)
 [facility] [place]  Note:  This document was prepared using Conservation officer, historic buildings and may include unintentional dictation errors.  MRN: [id number] DOB: 10/25/1940  Subjective:   Alexander Yang is a 83 y.o. male presenting for left knee pain following a fall that occurred 3 days ago.  Patient reports that he has some redness and mild swelling to the left knee.  Patient also states that he hit his forehead on the ground during the fall, denies loss of consciousness or altered mental status.  Patient does have significant bruising to the forehead but is most concerned about the left knee.  Patient has increased pain with ambulation.  Has not taken any over-the-counter medication to treat symptoms.  No current facility-administered medications for this encounter.  Current Outpatient Medications:    meloxicam  (MOBIC ) 7.5 MG tablet, Take 1 tablet (7.5 mg total) by mouth daily., Disp: 10 tablet, Rfl: 0   amLODipine  (NORVASC ) 5 MG tablet, TAKE ONE TABLET BY MOUTH ONCE A DAY, Disp: 90 tablet, Rfl: 3   atorvastatin  (LIPITOR) 40 MG tablet, TAKE ONE TABLET BY MOUTH ONCE DAILY, Disp: 90 tablet, Rfl: 3   buPROPion  (WELLBUTRIN  XL) 150 MG 24 hr tablet, TAKE ONE TABLET (150 MG TOTAL) BY MOUTH DAILY., Disp: 90 tablet, Rfl: 1   CALCIUM -VITAMIN D  PO, Take 2 tablets by mouth daily., Disp: , Rfl:    Continuous Glucose Receiver (FREESTYLE LIBRE 3 READER) DEVI, USE TO CHECK BLOOD SUGAR CONTINUOUS, Disp: 1 each, Rfl: 0   Continuous Glucose Sensor (FREESTYLE LIBRE 3 PLUS SENSOR) MISC, Change sensor every 15 days., Disp: 2 each, Rfl: 11   cyanocobalamin  (VITAMIN B12) 1000 MCG tablet, Take 1,000 mcg by mouth daily., Disp: , Rfl:    darolutamide (NUBEQA) 300 MG tablet, Take 600 mg by mouth 2 (two) times daily with a meal., Disp: , Rfl:    degarelix (FIRMAGON) 80 MG injection, Inject 80 mg into the skin every 28 (twenty-eight) days. (Patient not taking: Reported on 07/10/2023), Disp: , Rfl:     gabapentin (NEURONTIN) 300 MG capsule, Take 300 mg by mouth at bedtime., Disp: , Rfl:    glipiZIDE  (GLUCOTROL  XL) 10 MG 24 hr tablet, Take 1 tablet (10 mg total) by mouth daily with breakfast., Disp: 90 tablet, Rfl: 1   imipramine  (TOFRANIL ) 10 MG tablet, Take 4 tablets (40 mg total) by mouth at bedtime., Disp: 28 tablet, Rfl: 0   insulin  glargine (LANTUS  SOLOSTAR) 100 UNIT/ML Solostar Pen, Inject 20 Units into the skin daily., Disp: 30 mL, Rfl: 3   insulin  glulisine (APIDRA ) 100 UNIT/ML Solostar Pen, Inject 2 Units into the skin daily with lunch., Disp: 15 mL, Rfl: 3   Insulin  Pen Needle (PEN NEEDLES) 32G X 4 MM MISC, 1 Device by Other route daily in the afternoon. Use to inject insulin  daily, Disp: 100 each, Rfl: 3   ketoconazole  (NIZORAL ) 2 % cream, Apply 1 application  topically daily as needed for irritation., Disp: , Rfl:    losartan  (COZAAR ) 100 MG tablet, TAKE ONE TABLET BY MOUTH ONCE DAILY, Disp: 90 tablet, Rfl: 3   omeprazole (PRILOSEC) 40 MG capsule, Take 40 mg by mouth daily., Disp: , Rfl:    OVER THE COUNTER MEDICATION, Regaloid-Level teaspoon mix with water daily or every other day, Disp: , Rfl:    Allergies  Allergen Reactions   Abiraterone Other (See Comments)    Liver enzymes outside of reference range    Past Medical History:  Diagnosis Date   Cerebrovascular accident (HCC)  Diabetes mellitus without complication (HCC)    controlled with diet   Hypertension    [employer] [facility] ([facility]) Stroke Scale dysarthria score 1, mild to moderate dysarthria, patient slurs at least some words and, at worst, can be understood with some difficulty 1980   Osteoporosis    Prostate cancer [facility])      Past Surgical History:  Procedure Laterality Date   ACL Tear  03/12/1978   PROSTATE BIOPSY     TONSILLECTOMY  03/13/1947   VASECTOMY      Family History  Problem Relation Age of Onset   Cerebral aneurysm Mother    Diabetes Father    Bradycardia Father    Benign  prostatic hyperplasia Father    Supraventricular tachycardia Brother    Prostate cancer Brother    Supraventricular tachycardia Brother    Cancer Cousin        maternal; unknown type, dx late 64s   Esophageal cancer Neg Hx    Stomach cancer Neg Hx    Rectal cancer Neg Hx     Social History   Tobacco Use   Smoking status: Former    Current packs/day: 0.00    Average packs/day: 2.0 packs/day for 10.0 years (20.0 ttl pk-yrs)    Types: Cigarettes    Start date: 03/13/1955    Quit date: 03/12/1965    Years since quitting: 58.7   Smokeless tobacco: Never  Vaping Use   Vaping status: Never Used  Substance Use Topics   Alcohol use: Yes    Alcohol/week: 5.0 standard drinks of alcohol    Types: 2 Glasses of wine, 3 Cans of beer per week   Drug use: No    ROS Refer to HPI for ROS details.  Objective:   Vitals: BP 129/84 (BP Location: Right Arm)   Pulse 80   Temp 98 F (36.7 C) (Oral)   Resp 20   SpO2 97%   Physical Exam Vitals and nursing note reviewed.  Constitutional:      General: He is not in acute distress.    Appearance: Normal appearance. He is not ill-appearing or toxic-appearing.  HENT:     Head: Normocephalic. Contusion present.  Cardiovascular:     Rate and Rhythm: Normal rate.  Pulmonary:     Effort: Pulmonary effort is normal. No respiratory distress.  Musculoskeletal:     Left knee: Swelling, erythema, ecchymosis and bony tenderness present. No deformity or crepitus. Normal range of motion. Tenderness present. Normal pulse.  Skin:    General: Skin is warm and dry.     Capillary Refill: Capillary refill takes less than 2 seconds.     Findings: Bruising (Forehead secondary to fall) present.  Neurological:     General: No focal deficit present.     Mental Status: He is alert and oriented to person, place, and time.  Psychiatric:        Mood and Affect: Mood normal.        Behavior: Behavior normal.     Procedures  No results found for this or any  previous visit (from the past 24 hours).  DG Knee Complete 4 Views Left Result Date: 11/18/2023 CLINICAL DATA:  Left knee pain EXAM: LEFT KNEE - COMPLETE 4+ VIEW COMPARISON:  None Available. FINDINGS: Moderate tricompartment degenerative changes with joint space narrowing and spurring. Small joint effusion. No acute bony abnormality. Specifically, no fracture, subluxation, or dislocation. IMPRESSION: Moderate tricompartment degenerative changes with small joint effusion. No acute bony abnormality. Electronically Signed  By: Franky Crease M.D.   On: 11/18/2023 19:01     Assessment and Plan :     Discharge Instructions       1. Closed head injury, initial encounter (Primary) - Continue to monitor for any change in mental status, blurred vision, severe headache, lethargy, loss of coordination as these may be signs of intracranial injury. - Usually manifestation of the symptoms occurs within 12 to 24 hours of initial head injury. - If any change in symptoms or escalation of current symptoms follow-up in emergency department for further evaluation and treatment.  2. Post-traumatic osteoarthritis of left knee - DG Knee Complete 4 Views Left x-ray performed in UC shows no acute fracture or dislocation, moderate tricompartmental osteoarthritis noted to the left knee. - meloxicam  (MOBIC ) 7.5 MG tablet; Take 1 tablet (7.5 mg total) by mouth daily.  Dispense: 10 tablet; Refill: 0      Ethel KATHEE Aurea Aurea Ethel B, [place] 11/18/23 1914

## 2023-11-19 NOTE — Telephone Encounter (Signed)
 Noted and agreed.

## 2023-11-20 ENCOUNTER — Telehealth: Payer: Self-pay

## 2023-11-20 ENCOUNTER — Encounter: Payer: Self-pay | Admitting: Oncology

## 2023-11-20 ENCOUNTER — Other Ambulatory Visit (HOSPITAL_COMMUNITY): Payer: Self-pay

## 2023-11-20 NOTE — Telephone Encounter (Signed)
 Pharmacy Patient Advocate Encounter   Received notification from Onbase that prior authorization for FreeStyle Libre 3 plus sensor is required/requested.   Insurance verification completed.   The patient is insured through [facility] .   Per test claim: PA required; PA submitted to above mentioned insurance via Latent Key/confirmation #/EOC [id number] Status is pending

## 2023-11-22 ENCOUNTER — Other Ambulatory Visit (HOSPITAL_COMMUNITY): Payer: Self-pay

## 2023-11-22 NOTE — Telephone Encounter (Signed)
 Additional information has been requested from the patient's insurance in order to proceed with the prior authorization request. Requested information has been sent, or form has been filled out and faxed back to [phone]  [employer] Prior Authorization department 11/22/23

## 2023-11-25 ENCOUNTER — Other Ambulatory Visit (HOSPITAL_COMMUNITY): Payer: Self-pay

## 2023-11-25 NOTE — Telephone Encounter (Signed)
 Pharmacy Patient Advocate Encounter  Received notification from [employer] that Prior Authorization for Freestyle Libre 3 Plus sensor has been APPROVED from 11/22/23 to 03/11/24. Ran test claim, Copay is $0.00. This test claim was processed through [facility]- copay amounts may vary at other pharmacies due to pharmacy/plan contracts, or as the patient moves through the different stages of their insurance plan.     PA #/Case ID/Reference #: # [id number]

## 2023-12-23 ENCOUNTER — Encounter: Payer: Self-pay | Admitting: Family Medicine

## 2023-12-25 ENCOUNTER — Encounter: Payer: Self-pay | Admitting: Family Medicine

## 2023-12-26 ENCOUNTER — Other Ambulatory Visit: Payer: Self-pay | Admitting: Family Medicine

## 2024-01-06 ENCOUNTER — Encounter: Payer: Self-pay | Admitting: Family Medicine

## 2024-01-06 ENCOUNTER — Telehealth: Payer: Self-pay | Admitting: *Deleted

## 2024-01-06 DIAGNOSIS — S52021A Displaced fracture of olecranon process without intraarticular extension of right ulna, initial encounter for closed fracture: Secondary | ICD-10-CM

## 2024-01-06 NOTE — Telephone Encounter (Signed)
 Copied from CRM [id number]. Topic: Referral - Request for Referral >> Jan 06, 2024  3:58 PM Pinkey ORN wrote: Did the patient discuss referral with their provider in the last year? No (If No - schedule appointment) (If Yes - send message)  Appointment offered? No  Type of order/referral and detailed reason for visit: Orthopedic Surgeon / Broken [place]bow (Right)    Preference of office, provider, location: Dr. Alm Hummer   If referral order, have you been seen by this specialty before? No (If Yes, this issue or another issue? When? Where?  Can we respond through MyChart? Yes

## 2024-01-08 NOTE — Telephone Encounter (Signed)
 Referral already placed.

## 2024-01-09 ENCOUNTER — Ambulatory Visit: Admitting: Family Medicine

## 2024-01-09 ENCOUNTER — Ambulatory Visit: Payer: Self-pay | Admitting: Family Medicine

## 2024-01-09 ENCOUNTER — Encounter: Payer: Self-pay | Admitting: Family Medicine

## 2024-01-09 VITALS — BP 132/82 | HR 69 | Temp 97.7°F | Ht 71.0 in | Wt 194.2 lb

## 2024-01-09 DIAGNOSIS — E1149 Type 2 diabetes mellitus with other diabetic neurological complication: Secondary | ICD-10-CM

## 2024-01-09 LAB — POCT GLYCOSYLATED HEMOGLOBIN (HGB A1C): Hemoglobin A1C: 7.7 % — AB (ref 4.0–5.6)

## 2024-01-10 ENCOUNTER — Encounter: Payer: Self-pay | Admitting: Family Medicine

## 2024-01-10 ENCOUNTER — Ambulatory Visit: Admitting: Family Medicine

## 2024-01-10 VITALS — BP 140/82 | HR 86 | Temp 97.9°F | Ht 71.0 in | Wt 194.1 lb

## 2024-01-10 DIAGNOSIS — S52021K Displaced fracture of olecranon process without intraarticular extension of right ulna, subsequent encounter for closed fracture with nonunion: Secondary | ICD-10-CM | POA: Diagnosis not present

## 2024-01-10 DIAGNOSIS — Z7984 Long term (current) use of oral hypoglycemic drugs: Secondary | ICD-10-CM

## 2024-01-10 DIAGNOSIS — E1149 Type 2 diabetes mellitus with other diabetic neurological complication: Secondary | ICD-10-CM

## 2024-01-10 DIAGNOSIS — E1142 Type 2 diabetes mellitus with diabetic polyneuropathy: Secondary | ICD-10-CM

## 2024-01-10 DIAGNOSIS — E1159 Type 2 diabetes mellitus with other circulatory complications: Secondary | ICD-10-CM | POA: Diagnosis not present

## 2024-01-10 DIAGNOSIS — I152 Hypertension secondary to endocrine disorders: Secondary | ICD-10-CM

## 2024-01-10 NOTE — Progress Notes (Signed)
 Patient ID: Alexander Yang, male    DOB: 1940/04/24, 83 y.o.   MRN: [id number]  This visit was conducted in person.  BP (!) 140/82   Pulse 86   Temp 97.9 F (36.6 C) (Oral)   Ht 5' 11 (1.803 m)   Wt 194 lb 2 oz (88.1 kg)   SpO2 98%   BMI 27.07 kg/m    CC:  Chief Complaint  Patient presents with   Medical Management of Chronic Issues    Pt here for DM f/u     Subjective:   HPI: Alexander Yang is a 83 y.o. male presenting on 01/10/2024 for Medical Management of Chronic Issues (Pt here for DM f/u )   Closed fracture of olecranon, significantly displaced and separated.. now followed by  [facility].  Occurred after a fall on 10/22 on vacation, no proceeding symptoms.  Told wear sling, not a great surgical candidate, given it time... wife is worried about this plan.  Has happened twice when trying to move something.  NO LOC, no head injury. Pain moderate at night.. waking him up.  Wife now wants a second opinion about whether surgery needed or not.  Using tylenol for pain.   Has history of peripheral neuropathy form DM.  Diabetes:   worsened control  on glipizide  XL and lantus  25 units .  low-dose fast acting insulin  Apidra  prior to his meal per Sliding scale ( most he uses is 10 units) Farxiga  not tolerated given nausea.  Stopped metformin  due to possible GI irritation. SE to trulicity  in past, severe. Lab Results  Component Value Date   HGBA1C 7.7 (A) 01/09/2024  Using medications without difficulties: Hypoglycemic episodes:none Hyperglycemic episodes:  occ Feet problems: no ulcers Blood Sugars averaging: On Freestyle Libre 70% of time in target, 30 % above goal. eye exam within last year:  Has  CGM  Hypertension:    Good control on amlodipine  5 mg daily losartan  100 mg daily. BP Readings from Last 3 Encounters:  01/10/24 (!) 140/82  01/09/24 132/82  11/18/23 129/84  Using medication without problems or lightheadedness:  none Chest pain with  exertion:none Edema: none Short of breath: none Average home BPs: not checking Other issues: Wt Readings from Last 3 Encounters:  01/10/24 194 lb 2 oz (88.1 kg)  01/09/24 194 lb 4 oz (88.1 kg)  07/10/23 194 lb 3.2 oz (88.1 kg)     Prostate cancer: Followed by [facility] and [facility].  On active treatment.  Wt Readings from Last 3 Encounters:  01/10/24 194 lb 2 oz (88.1 kg)  01/09/24 194 lb 4 oz (88.1 kg)  07/10/23 194 lb 3.2 oz (88.1 kg)    Relevant past medical, surgical, family and social history reviewed and updated as indicated. Interim medical history since our last visit reviewed. Allergies and medications reviewed and updated. Outpatient Medications Prior to Visit  Medication Sig Dispense Refill   amLODipine  (NORVASC ) 5 MG tablet TAKE ONE TABLET BY MOUTH ONCE A DAY 90 tablet 3   atorvastatin  (LIPITOR) 40 MG tablet TAKE ONE TABLET BY MOUTH ONCE DAILY 90 tablet 3   buPROPion  (WELLBUTRIN  XL) 150 MG 24 hr tablet TAKE ONE TABLET (150 MG TOTAL) BY MOUTH DAILY. 90 tablet 1   CALCIUM -VITAMIN D  PO Take 2 tablets by mouth daily.     Continuous Glucose Receiver (FREESTYLE LIBRE 3 READER) DEVI USE TO CHECK BLOOD SUGAR CONTINUOUS 1 each 0   Continuous Glucose Sensor (FREESTYLE LIBRE 3 PLUS SENSOR) MISC Change sensor every 15  days. 2 each 11   cyanocobalamin  (VITAMIN B12) 1000 MCG tablet Take 1,000 mcg by mouth daily.     darolutamide (NUBEQA) 300 MG tablet Take 600 mg by mouth 2 (two) times daily with a meal.     degarelix (FIRMAGON) 80 MG injection Inject 80 mg into the skin every 28 (twenty-eight) days. (Patient not taking: Reported on 01/10/2024)     gabapentin (NEURONTIN) 300 MG capsule Take 300 mg by mouth at bedtime.     glipiZIDE  (GLUCOTROL  XL) 10 MG 24 hr tablet Take 1 tablet (10 mg total) by mouth daily with breakfast. 90 tablet 1   imipramine  (TOFRANIL ) 10 MG tablet Take 4 tablets (40 mg total) by mouth at bedtime. 28 tablet 0   insulin  glargine (LANTUS  SOLOSTAR) 100 UNIT/ML Solostar Pen  Inject 20 Units into the skin daily. 30 mL 3   insulin  glulisine (APIDRA ) 100 UNIT/ML Solostar Pen Inject 2 Units into the skin daily with lunch. 15 mL 3   Insulin  Pen Needle (PEN NEEDLES) 32G X 4 MM MISC 1 Device by Other route daily in the afternoon. Use to inject insulin  daily 100 each 3   ketoconazole  (NIZORAL ) 2 % cream Apply 1 application  topically daily as needed for irritation.     leuprolide , 6 Month, (LEUPROLIDE  ACETATE, 6 MONTH,) 45 MG injection Inject 45 mg into the skin.     losartan  (COZAAR ) 100 MG tablet TAKE ONE TABLET BY MOUTH ONCE DAILY 90 tablet 3   OVER THE COUNTER MEDICATION Regaloid-Level teaspoon mix with water daily or every other day     No facility-administered medications prior to visit.     Per HPI unless specifically indicated in ROS section below Review of Systems  Constitutional:  Negative for fatigue and fever.  HENT:  Negative for ear pain.   Eyes:  Negative for pain.  Respiratory:  Negative for cough and shortness of breath.   Cardiovascular:  Negative for chest pain, palpitations and leg swelling.  Gastrointestinal:  Positive for abdominal distention. Negative for abdominal pain.  Genitourinary:  Negative for dysuria.  Musculoskeletal:  Negative for arthralgias.  Neurological:  Negative for syncope, light-headedness and headaches.  Psychiatric/Behavioral:  Negative for dysphoric mood.    Objective:  BP (!) 140/82   Pulse 86   Temp 97.9 F (36.6 C) (Oral)   Ht 5' 11 (1.803 m)   Wt 194 lb 2 oz (88.1 kg)   SpO2 98%   BMI 27.07 kg/m   Wt Readings from Last 3 Encounters:  01/10/24 194 lb 2 oz (88.1 kg)  01/09/24 194 lb 4 oz (88.1 kg)  07/10/23 194 lb 3.2 oz (88.1 kg)      Physical Exam Constitutional:      General: He is not in acute distress.    Appearance: Normal appearance. He is well-developed. He is not ill-appearing or toxic-appearing.  HENT:     Head: Normocephalic and atraumatic.     Right Ear: Hearing, tympanic membrane, ear canal  and external ear normal.     Left Ear: Hearing, tympanic membrane, ear canal and external ear normal.     Nose: Nose normal.     Mouth/Throat:     Pharynx: Uvula midline.  Eyes:     General: Lids are normal. Lids are everted, no foreign bodies appreciated.     Conjunctiva/sclera: Conjunctivae normal.     Pupils: Pupils are equal, round, and reactive to light.  Neck:     Thyroid : No thyroid  mass or thyromegaly.  Vascular: No carotid bruit.     Trachea: Trachea and phonation normal.  Cardiovascular:     Rate and Rhythm: Normal rate and regular rhythm.     Pulses: Normal pulses.     Heart sounds: S1 normal and S2 normal. No murmur heard.    No gallop.  Pulmonary:     Breath sounds: Normal breath sounds. No wheezing, rhonchi or rales.  Abdominal:     General: Bowel sounds are normal.     Palpations: Abdomen is soft.     Tenderness: There is no abdominal tenderness. There is no guarding or rebound.     Hernia: No hernia is present.  Musculoskeletal:     Cervical back: Normal range of motion and neck supple.  Lymphadenopathy:     Cervical: No cervical adenopathy.  Skin:    General: Skin is warm and dry.     Findings: No rash.  Neurological:     Mental Status: He is alert.     Cranial Nerves: No cranial nerve deficit.     Sensory: No sensory deficit.     Gait: Gait normal.     Deep Tendon Reflexes: Reflexes are normal and symmetric.  Psychiatric:        Speech: Speech normal.        Behavior: Behavior normal.        Judgment: Judgment normal.    Diabetic foot exam: Normal inspection No skin breakdown  Great to and heel calluses  Normal DP pulses Normal sensation to light touch and monofilament Nails normal     Results for orders placed or performed in visit on 01/09/24  POCT glycosylated hemoglobin (Hb A1C)   Collection Time: 01/09/24 11:27 AM  Result Value Ref Range   Hemoglobin A1C 7.7 (A) 4.0 - 5.6 %   HbA1c POC (<> result, manual entry)     HbA1c, POC  (prediabetic range)     HbA1c, POC (controlled diabetic range)      This visit occurred during the SARS-CoV-2 public health emergency.  Safety protocols were in place, including screening questions prior to the visit, additional usage of staff PPE, and extensive cleaning of exam room while observing appropriate contact time as indicated for disinfecting solutions.   COVID 19 screen:  No recent travel or known exposure to COVID19 The patient denies respiratory symptoms of COVID 19 at this time. The importance of social distancing was discussed today.   Assessment and Plan   The patient's preventative maintenance and recommended screening tests for an annual wellness exam were reviewed in full today. Brought up to date unless services declined.  Counselled on the importance of diet, exercise, and its role in overall health and mortality. The patient's FH and SH was reviewed, including their home life, tobacco status, and drug and alcohol status.   Vaccines: uptodate, consider Tdap, COVID x 4 Colon:  2019 negative, no further indicated   Prostate cancer:  followed by [facility]  Sees Dermatologist.  Problem List Items Addressed This Visit     Closed fracture of right olecranon process - Primary   Acute, patient requested second opinion for whether surgery indicated or not. Pain tolerably well-controlled.      Relevant Orders   Ambulatory referral to Orthopedic Surgery   Diabetes mellitus with neurological manifestations, controlled (HCC) (Chronic)   Chronic, inadequate control  He is using glargine insulin  25 Units units in the morning as well as Glucotrol  XL max dose. low-dose fast acting insulin  Apidra  prior to his meal  per Sliding scale.  Encouraged high-protein high-fiber bedtime snack.       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   ALA (omega-3 fatty acid)  [phone] mg daily       Hypertension  associated with diabetes (HCC) (Chronic)   Stable, chronic.  Continue current medication.   amlodipine  5 mg daily losartan  100 mg daily.         Greig Ring, MD

## 2024-01-16 ENCOUNTER — Other Ambulatory Visit: Payer: Self-pay | Admitting: Orthopedic Surgery

## 2024-01-21 DIAGNOSIS — S52021A Displaced fracture of olecranon process without intraarticular extension of right ulna, initial encounter for closed fracture: Secondary | ICD-10-CM | POA: Insufficient documentation

## 2024-01-21 LAB — HM DIABETES FOOT EXAM

## 2024-01-21 NOTE — Assessment & Plan Note (Signed)
 Chronic, minimal pain.   ALA (omega-3 fatty acid)  [phone] mg daily

## 2024-01-21 NOTE — Progress Notes (Signed)
 Patient left before being seen.

## 2024-01-21 NOTE — Assessment & Plan Note (Addendum)
 Acute, patient requested second opinion for whether surgery indicated or not. Pain tolerably well-controlled.

## 2024-01-21 NOTE — Assessment & Plan Note (Signed)
 Chronic, inadequate control  He is using glargine insulin  25 Units units in the morning as well as Glucotrol  XL max dose. low-dose fast acting insulin  Apidra  prior to his meal per Sliding scale.  Encouraged high-protein high-fiber bedtime snack.

## 2024-01-21 NOTE — Assessment & Plan Note (Signed)
Stable, chronic.  Continue current medication.   amlodipine 5 mg daily losartan 100 mg daily.

## 2024-01-23 ENCOUNTER — Encounter (HOSPITAL_BASED_OUTPATIENT_CLINIC_OR_DEPARTMENT_OTHER): Payer: Self-pay | Admitting: Orthopedic Surgery

## 2024-01-23 ENCOUNTER — Other Ambulatory Visit: Payer: Self-pay

## 2024-01-27 ENCOUNTER — Ambulatory Visit (HOSPITAL_BASED_OUTPATIENT_CLINIC_OR_DEPARTMENT_OTHER)
Admission: RE | Admit: 2024-01-27 | Discharge: 2024-01-27 | Disposition: A | Discharge status: Home / Self Care | Attending: Orthopedic Surgery | Admitting: Orthopedic Surgery

## 2024-01-27 ENCOUNTER — Ambulatory Visit (HOSPITAL_COMMUNITY)

## 2024-01-27 ENCOUNTER — Encounter (HOSPITAL_BASED_OUTPATIENT_CLINIC_OR_DEPARTMENT_OTHER)
Admission: RE | Disposition: A | Discharge status: Home / Self Care | Payer: Self-pay | Source: Home / Self Care | Attending: Orthopedic Surgery

## 2024-01-27 ENCOUNTER — Ambulatory Visit (HOSPITAL_BASED_OUTPATIENT_CLINIC_OR_DEPARTMENT_OTHER): Admitting: Anesthesiology

## 2024-01-27 ENCOUNTER — Other Ambulatory Visit: Payer: Self-pay

## 2024-01-27 ENCOUNTER — Encounter (HOSPITAL_BASED_OUTPATIENT_CLINIC_OR_DEPARTMENT_OTHER): Payer: Self-pay | Admitting: Orthopedic Surgery

## 2024-01-27 DIAGNOSIS — S52031A Displaced fracture of olecranon process with intraarticular extension of right ulna, initial encounter for closed fracture: Secondary | ICD-10-CM | POA: Insufficient documentation

## 2024-01-27 DIAGNOSIS — X58XXXA Exposure to other specified factors, initial encounter: Secondary | ICD-10-CM | POA: Diagnosis not present

## 2024-01-27 DIAGNOSIS — E119 Type 2 diabetes mellitus without complications: Secondary | ICD-10-CM | POA: Diagnosis not present

## 2024-01-27 DIAGNOSIS — I10 Essential (primary) hypertension: Secondary | ICD-10-CM

## 2024-01-27 DIAGNOSIS — C61 Malignant neoplasm of prostate: Secondary | ICD-10-CM | POA: Diagnosis not present

## 2024-01-27 DIAGNOSIS — E1159 Type 2 diabetes mellitus with other circulatory complications: Secondary | ICD-10-CM

## 2024-01-27 DIAGNOSIS — S52021D Displaced fracture of olecranon process without intraarticular extension of right ulna, subsequent encounter for closed fracture with routine healing: Secondary | ICD-10-CM

## 2024-01-27 DIAGNOSIS — Z87891 Personal history of nicotine dependence: Secondary | ICD-10-CM | POA: Insufficient documentation

## 2024-01-27 DIAGNOSIS — E1149 Type 2 diabetes mellitus with other diabetic neurological complication: Secondary | ICD-10-CM

## 2024-01-27 HISTORY — PX: ORIF ELBOW FRACTURE: SHX5031

## 2024-01-27 LAB — BASIC METABOLIC PANEL WITH GFR
Anion gap: 11 (ref 5–15)
BUN: 21 mg/dL (ref 8–23)
CO2: 23 mmol/L (ref 22–32)
Calcium: 8.8 mg/dL — ABNORMAL LOW (ref 8.9–10.3)
Chloride: 108 mmol/L (ref 98–111)
Creatinine, Ser: 1.18 mg/dL (ref 0.61–1.24)
GFR, Estimated: >60 mL/min (ref >60)
Glucose, Bld: 153 mg/dL — ABNORMAL HIGH (ref 70–99)
Potassium: 4.7 mmol/L (ref 3.5–5.1)
Sodium: 142 mmol/L (ref 135–145)

## 2024-01-27 LAB — GLUCOSE, CAPILLARY
Glucose-Capillary: 162 mg/dL — ABNORMAL HIGH (ref 70–99)
Glucose-Capillary: 131 mg/dL — ABNORMAL HIGH (ref 70–99)

## 2024-01-27 SURGERY — OPEN REDUCTION INTERNAL FIXATION, FRACTURE, OLECRANON
Anesthesia: Monitor Anesthesia Care | Site: Elbow | Laterality: Right

## 2024-01-27 MED ORDER — IBUPROFEN 200 MG PO TABS: 600.0000 mg | ORAL_TABLET | Freq: Four times a day (QID) | ORAL | Status: AC

## 2024-01-27 MED ORDER — FENTANYL CITRATE (PF) 100 MCG/2ML IJ SOLN
INTRAMUSCULAR | Status: AC
  Filled 2024-01-27: qty 2

## 2024-01-27 MED ORDER — OXYCODONE HCL 5 MG PO TABS: 5.0000 mg | ORAL_TABLET | Freq: Four times a day (QID) | ORAL | 0 refills | Status: AC | PRN

## 2024-01-27 MED ORDER — 0.9 % SODIUM CHLORIDE (POUR BTL) OPTIME
TOPICAL | Status: DC | PRN
  Administered 2024-01-27: 1000 mL

## 2024-01-27 MED ORDER — CEFAZOLIN SODIUM-DEXTROSE 2-4 GM/100ML-% IV SOLN
2.0000 g | INTRAVENOUS | Status: AC
  Administered 2024-01-27: 2 g via INTRAVENOUS

## 2024-01-27 MED ORDER — PROPOFOL 500 MG/50ML IV EMUL
INTRAVENOUS | Status: DC | PRN
  Administered 2024-01-27: 100 ug/kg/min via INTRAVENOUS

## 2024-01-27 MED ORDER — MIDAZOLAM HCL 2 MG/2ML IJ SOLN
INTRAMUSCULAR | Status: AC
  Filled 2024-01-27: qty 2

## 2024-01-27 MED ORDER — FENTANYL CITRATE (PF) 100 MCG/2ML IJ SOLN: 25.0000 ug | INTRAMUSCULAR | Status: DC | PRN

## 2024-01-27 MED ORDER — FENTANYL CITRATE (PF) 100 MCG/2ML IJ SOLN
50.0000 ug | Freq: Once | INTRAMUSCULAR | Status: AC
  Administered 2024-01-27: 50 ug via INTRAVENOUS

## 2024-01-27 MED ORDER — ACETAMINOPHEN 500 MG PO TABS
1000.0000 mg | ORAL_TABLET | Freq: Once | ORAL | Status: AC
  Administered 2024-01-27: 1000 mg via ORAL

## 2024-01-27 MED ORDER — CEFAZOLIN SODIUM-DEXTROSE 2-4 GM/100ML-% IV SOLN
INTRAVENOUS | Status: AC
  Filled 2024-01-27: qty 100

## 2024-01-27 MED ORDER — BUPIVACAINE-EPINEPHRINE (PF) 0.5% -1:200000 IJ SOLN
INTRAMUSCULAR | Status: DC | PRN
  Administered 2024-01-27: 30 mL via PERINEURAL

## 2024-01-27 MED ORDER — ACETAMINOPHEN 500 MG PO TABS
ORAL_TABLET | ORAL | Status: AC
  Filled 2024-01-27: qty 2

## 2024-01-27 MED ORDER — ONDANSETRON HCL 4 MG/2ML IJ SOLN: 4.0000 mg | Freq: Once | INTRAMUSCULAR | Status: DC | PRN

## 2024-01-27 MED ORDER — LACTATED RINGERS IV SOLN: INTRAVENOUS | Status: DC

## 2024-01-27 MED ORDER — POVIDONE-IODINE 7.5 % EX SOLN
Freq: Once | CUTANEOUS | Status: DC
  Filled 2024-01-27: qty 118

## 2024-01-27 MED ORDER — ACETAMINOPHEN 325 MG PO TABS: 650.0000 mg | ORAL_TABLET | Freq: Four times a day (QID) | ORAL | Status: AC

## 2024-01-27 SURGICAL SUPPLY — 65 items
BIT DRILL SOLID SSC 2.7X40 (DRILL) IMPLANT
BLADE HEX COATED 2.75 (ELECTRODE) ×2 IMPLANT
BLADE SURG 15 STRL LF DISP TIS (BLADE) ×4 IMPLANT
BNDG COHESIVE 4X5 TAN STRL LF (GAUZE/BANDAGES/DRESSINGS) ×2 IMPLANT
BNDG COHESIVE 6X5 TAN ST LF (GAUZE/BANDAGES/DRESSINGS) IMPLANT
BNDG COMPR ESMARK 4X3 LF (GAUZE/BANDAGES/DRESSINGS) ×2 IMPLANT
BNDG GAUZE DERMACEA FLUFF 4 (GAUZE/BANDAGES/DRESSINGS) ×4 IMPLANT
BRUSH SCRUB EZ PLAIN DRY (MISCELLANEOUS) IMPLANT
CANISTER SUCT 1200ML W/VALVE (MISCELLANEOUS) ×2 IMPLANT
CHLORAPREP W/TINT 26 (MISCELLANEOUS) ×2 IMPLANT
CORD BIPOLAR FORCEPS 12FT (ELECTRODE) ×2 IMPLANT
COVER BACK TABLE 60X90IN (DRAPES) ×2 IMPLANT
COVER MAYO STAND STRL (DRAPES) ×2 IMPLANT
CUFF TOURN SGL QUICK 18X3 (MISCELLANEOUS) ×2 IMPLANT
CUFF TOURN SGL QUICK 18X4 (TOURNIQUET CUFF) IMPLANT
CUFF TRNQT CYL 24X4X16.5-23 (TOURNIQUET CUFF) IMPLANT
DRAPE C-ARM 42X72 X-RAY (DRAPES) ×2 IMPLANT
DRAPE EXTREMITY T 121X128X90 (DISPOSABLE) IMPLANT
DRAPE U-SHAPE 47X51 STRL (DRAPES) ×2 IMPLANT
DRIVER QUICK CONNECT T10 (MISCELLANEOUS) IMPLANT
DRSG ADAPTIC 3X8 NADH LF (GAUZE/BANDAGES/DRESSINGS) ×2 IMPLANT
ELECTRODE REM PT RTRN 9FT ADLT (ELECTROSURGICAL) ×2 IMPLANT
GAUZE SPONGE 4X4 12PLY STRL (GAUZE/BANDAGES/DRESSINGS) IMPLANT
GAUZE SPONGE 4X4 12PLY STRL LF (GAUZE/BANDAGES/DRESSINGS) ×2 IMPLANT
GLOVE BIO SURGEON STRL SZ7.5 (GLOVE) ×2 IMPLANT
GLOVE BIOGEL PI IND STRL 7.0 (GLOVE) ×2 IMPLANT
GLOVE BIOGEL PI IND STRL 8 (GLOVE) ×2 IMPLANT
GLOVE ECLIPSE 6.5 STRL STRAW (GLOVE) ×2 IMPLANT
GOWN STRL REUS W/TWL XL LVL3 (GOWN DISPOSABLE) ×2 IMPLANT
GUIDE TARGET AIMING 2.0 DISP (WIRE) IMPLANT
KWIRE STD TIP 2X152 (WIRE) IMPLANT
NDL HYPO 25X1 1.5 SAFETY (NEEDLE) IMPLANT
PACK BASIN DAY SURGERY FS (CUSTOM PROCEDURE TRAY) ×2 IMPLANT
PADDING CAST ABS COTTON 4X4 ST (CAST SUPPLIES) ×2 IMPLANT
PADDING CAST SYNTHETIC 4X4 STR (CAST SUPPLIES) IMPLANT
PENCIL SMOKE EVACUATOR (MISCELLANEOUS) ×2 IMPLANT
PLATE FREEFIX 3-SLOT TIT RT (Plate) IMPLANT
SCREW COMP MTHRD 3.5X24 (Screw) IMPLANT
SCREW COMP MTHRD TITA 3.5X48 (Screw) IMPLANT
SCREW COMPR FREEFIX 3.5X18 (Screw) IMPLANT
SCREW FREEFIX 3.5X16 LOCKING (Screw) IMPLANT
SCREW FREEFIX 3.5X24 LOCKING (Screw) IMPLANT
SCREW FREEFIX 3.5X28 LOCKING (Screw) IMPLANT
SCREW LOCK FREEFIX 3.5X18 (Screw) IMPLANT
SCREW LOCK MTHRD 3.5X22 (Screw) IMPLANT
SCREW MULTI-THRD LOCK 3.5X30 (Screw) IMPLANT
SCREW MULTI-THRD LOCK 3.5X34 (Screw) IMPLANT
SLEEVE SCD COMPRESS KNEE MED (STOCKING) ×2 IMPLANT
SOLN 0.9% NACL POUR BTL 1000ML (IV SOLUTION) ×2 IMPLANT
SPLINT FIBERGLASS 3X35 (CAST SUPPLIES) IMPLANT
SPONGE T-LAP 18X18 ~~LOC~~+RFID (SPONGE) ×2 IMPLANT
STAPLER SKIN PROX WIDE 3.9 (STAPLE) IMPLANT
STOCKINETTE 6 STRL (DRAPES) ×2 IMPLANT
SUCTION TUBE FRAZIER 10FR DISP (SUCTIONS) IMPLANT
SUT VIC AB 2-0 PS2 27 (SUTURE) ×2 IMPLANT
SUT VIC AB 3-0 SH 27X BRD (SUTURE) ×2 IMPLANT
SUT VIC AB 4-0 PS2 18 (SUTURE) IMPLANT
SUT VICRYL RAPIDE 4-0 (SUTURE) IMPLANT
SUT VICRYL RAPIDE 4/0 PS 2 (SUTURE) IMPLANT
SYR 10ML LL (SYRINGE) IMPLANT
SYR BULB EAR ULCER 3OZ GRN STR (SYRINGE) ×2 IMPLANT
TOWEL GREEN STERILE FF (TOWEL DISPOSABLE) ×4 IMPLANT
TUBE CONNECTING 20X1/4 (TUBING) ×2 IMPLANT
UNDERPAD 30X36 HEAVY ABSORB (UNDERPADS AND DIAPERS) IMPLANT
YANKAUER SUCT BULB TIP NO VENT (SUCTIONS) ×2 IMPLANT

## 2024-01-27 NOTE — H&P (Signed)
 Chief Complaint RIGHT ELBOW PAIN Patient's Care Team Primary Care Provider: AMY BEDSOLE MD: [address], [place], [place] [address], Ph [phone], Fax [phone] NPI: [id number] Patient's Pharmacies [facility]): [address], [place], [place] [address], Ph [phone], Fax [phone] Vitals 2024-01-14 13:45 Ht: 5 ft 11 in Wt: 194 lbs BMI: 27.1 Allergies Reviewed Allergies ABIRATERONE ACETATE: - Liver enzymes outside of reference range  Medications Reviewed Medications atorvastatin  03/09/21   entered Magdaline Breen Eligard  03/09/21   entered Magdaline Breen Flowflex COVID-19 Antigen Home Test kit REFER TO MANUFACTUERE INSTRUCTIONS INCLUDED IN PACKAGING 12/08/20   filled surescripts losartan  03/09/21   entered Magdaline Breen Problems Reviewed Problems Closed fracture of olecranon process of ulna - Onset: 01/14/2024, Right Clavicle pain - Onset: 03/09/2021 Clavicle pain - Onset: 03/09/2021, Left Closed fracture of left clavicle - Onset: 03/09/2021 Pain of elbow region - Onset: 01/14/2024, Right Closed fracture olecranon, intra-articular - Onset: 01/07/2024, Right Family History Reviewed Family History Social History Reviewed Social History Activities of Daily Living Which of your hands is dominant?: Right Advance Directive Do you have an advance directive?: Yes Do you have a medical power of attorney?: Yes Substance Use Do you or have you ever smoked tobacco?: Former smoker How many years have you smoked tobacco?: 6 How much tobacco do you smoke?: None How much tobacco do you chew?: none What is your level of alcohol consumption?: Moderate Social History Ortho Have you had a pneumonia vaccine in the last 12 months?: No Would you like to be contacted for a Clinical Research Trial?: Yes Gender Identity and LGBTQ Identity Gender identity: Identifies as Male Assigned sex at birth: Male Pronouns: he/him First name used: JOE Sexual  orientation: Straight or heterosexual Surgical & Procedure History Reviewed Surgical & Procedure History  Patient indicated no previous surgeries on (03/22/2021) Patient indicated no previous surgeries on (01/14/2024) Past Medical History Reviewed Past Medical History Cancer: Y Diabetes: Y High Cholesterol: Y Hypertension: Y Screening None recorded. HPI Work Comp: no  DOI: 01/01/2024  Handedness: RHD  This patient is an 83 year old, right-hand-dominant, male who indicates that he fell onto his right elbow on 01/01/2024. He was seen at a [facility] urgent care in [place] on 01/06/2024 and then was referred to [facility], where he was rex-rayed and evaluated by a advice worker. The physician assistant consulted with Dr. Soira and it was decided that this particular problem will be treated closed with a sling and posterior splint. He received new x-rays today when he was evaluated with a follow-up appointment by [facility], which we are not able to see on our software, Joints, however the patient did bring a printout of his x-rays from this week and 01/07/2024. He is here today basically for a second opinion to discuss treatment options including surgery. He currently is being treated for stage IV prostate cancer as well as diabetes. ROS ROS as noted in the HPI Physical Exam Right elbow and arm are in a well-fitted posterior splint resting at 90 degrees. Hand is ecchymotic and digits are mildly swollen but capable of making a full fist. Light touch is intact at each digital fingertip including small and ring without altered sensibility. Skin on the margins of the splint is intact with no abrasions. Procedure Documentation None recorded. Assessment / Plan 1.  Closed displaced intra-articular fracture of olecranon process of right ulna, initial encounter - Onset: 01/07/2024  Assessment: Right olecranon process fracture, 2 weeks from injury in a posterior  splint  Plan: We discussed these findings. We discussed surgery versus treating the fracture in a closed fashion. Dr. Sebastian plans on discussing this with his oncologist Dr. Kaaren in [place] . He plans to contact the patient once he has discussed this case with his oncologist.--Conversation occurred, recommended proceeding with ORIF.  Patient's immunotherapy does not increase risk for wound healing, and recent PSA very low.  D/W patient, and he wishes to proceed week of 11-17.

## 2024-01-27 NOTE — Transfer of Care (Signed)
 Immediate Anesthesia Transfer of Care Note  Patient: Alexander Yang  Procedure(s) Performed: OPEN REDUCTION INTERNAL FIXATION, FRACTURE, OLECRANON (Right: Elbow)  Patient Location: PACU  Anesthesia Type:MAC  Level of Consciousness: drowsy  Airway & Oxygen Therapy: Patient Spontanous Breathing and Patient connected to face mask oxygen  Post-op Assessment: Report given to RN and Post -op Vital signs reviewed and stable  Post vital signs: Reviewed and stable  Last Vitals:  Vitals Value Taken Time  BP 118/65 01/27/24 15:07  Temp    Pulse 70 01/27/24 15:11  Resp 17 01/27/24 15:11  SpO2 99 % 01/27/24 15:11  Vitals shown include unfiled device data.  Last Pain:  Vitals:   01/27/24 1104  TempSrc: Tympanic  PainSc: 0-No pain      Patients Stated Pain Goal: 5 (01/27/24 1104)  Complications: No notable events documented.

## 2024-01-27 NOTE — Interval H&P Note (Signed)
 History and Physical Interval Note:  01/27/2024 12:53 PM  Alexander Yang  has presented today for surgery, with the diagnosis of DISPLACED RIGHT ELBOW OLECRON FRACTURE.  The various methods of treatment have been discussed with the patient and family. After consideration of risks, benefits and other options for treatment, the patient has consented to  Procedure(s): OPEN REDUCTION INTERNAL FIXATION, FRACTURE, OLECRANON (Right) as a surgical intervention.  The patient's history has been reviewed, patient examined, no change in status, stable for surgery.  I have reviewed the patient's chart and labs.  Questions were answered to the patient's satisfaction.     Alm DELENA Hummer

## 2024-01-27 NOTE — Anesthesia Procedure Notes (Signed)
 Anesthesia Regional Block: Supraclavicular block   Pre-Anesthetic Checklist: , timeout performed,  Correct Patient, Correct Site, Correct Laterality,  Correct Procedure, Correct Position, site marked,  Risks and benefits discussed,  Surgical consent,  Pre-op evaluation,  At surgeon's request and post-op pain management  Laterality: Right  Prep: chloraprep       Needles:  Injection technique: Single-shot  Needle Type: Echogenic Needle     Needle Length: 9cm  Needle Gauge: 21     Additional Needles:   Procedures:,,,, ultrasound used (permanent image in chart),,    Narrative:  Start time: 01/27/2024 12:37 PM End time: 01/27/2024 12:45 PM Injection made incrementally with aspirations every 5 mL.  Performed by: Personally  Anesthesiologist: Corinne Garnette BRAVO, MD  Additional Notes: No pain on injection. No increased resistance to injection. Injection made in 5cc increments.  Good needle visualization.  Patient tolerated procedure well.

## 2024-01-27 NOTE — Op Note (Signed)
 01/27/2024  12:54 PM  PATIENT:  Alexander Yang    PRE-OPERATIVE DIAGNOSIS:  DISPLACED RIGHT ELBOW OLECRON FRACTURE  POST-OPERATIVE DIAGNOSIS:  Same  PROCEDURE:  OPEN REDUCTION INTERNAL FIXATION, FRACTURE, OLECRANON  SURGEON:  Alm DELENA Hummer, MD  ASSISTANT: None  ANESTHESIA:   General  PREOPERATIVE INDICATIONS:  Alexander Yang is a  83 y.o. male with a diagnosis of DISPLACED RIGHT ELBOW OLECRON FRACTURE who elected for surgical management.    The risks, benefits, and alternatives were discussed with the patient including but not limited to the risks of nonoperative treatment, versus surgical intervention including infection, bleeding, nerve injury, malunion, nonunion, the need for revision surgery, hardware prominence, hardware failure, the need for hardware removal, blood clots, & cardiopulmonary complications, and the patient verbalized understanding and a desire to proceed.  OPERATIVE IMPLANTS: [employer] olecranon plate/screws  OPERATIVE PROCEDURE:   This patient was identified in the holding area where he received a preoperative regional block.  He was escorted to the operative theater and placed in a supine position, then repositioned to the floppy lateral beanbag.  The operative extremity had already been prescribed in the holding area, therefore  a tourniquet was applied high on the arm towards the axilla and the arm was performed with prepped and draped in standard fashion.  Surgical timeout was observed and prophylactic antibiotics were addressed.  Limb was exsanguinated with an Esmarch bandage and tourniquet inflated to 250 mmHg.  A direct linear dorsal longitudinal exposure was created, coursing slightly laterally to the prominence of the olecranon.  Full-thickness flaps were elevated.  The fracture was identified.   There was organizing clot and healing tissue within the large fracture gap, and this was excised with curette and suction.  In this manner the fracture  was cleaned back to cancellous bone.  The large olecranon fragment was then reduced and reduction was found to be acceptable.  The plate was secured first to the fragment, using a stabilized pin and the proximalmost screw hole.  This allowed for better control of that fragment which was then reduced to the shaft fragment and the plate secured to the shaft with a single screw capable of some additional further compression.  Images were obtained revealing acceptable alignment.  Another stabilized pin was placed within the olecranon fragment and one of the other sites, and the first K wire was removed.  It was then drilled and a long screw paralleling the joint surface was placed and this allowed for good compression, first loosening the shaft screw to allow for better compression both of the plate to the olecranon and also across the olecranon fracture site.  The shaft screw was then tightened a little bit and the additional shaft locking screws placed.  The locking screws were also placed within the olecranon fragment and ultimately final images obtained.  Pronation and supination were full, and it first there appeared to be a slight catch with forearm rotation, and I realized that this was actually from ECU instability at the wrist with some snapping over the distal ulna.  This was not addressed differently at this time and we can see if it still a problem where the of independent intervention upon follow-up and rehab.  Final images were obtained and saved.  The wound was irrigated and the tourniquet released.  The skin was reapproximated with 3-0 Vicryl interrupted deep dermal subcuticular sutures followed by staples and a bulky dressing with fiberglass component was placed, without there being any direct pressure over the wound  on the posterior aspect of the arm itself.  He was taken to the recovery room in stable condition.  DISPOSITION: He will be discharged home today with typical instructions returning in 7  to 10 days for reevaluation with new x-rays of the right elbow out of splint, likely converting just to a sling with motion at that time.

## 2024-01-27 NOTE — Anesthesia Procedure Notes (Signed)
 Procedure Name: MAC Date/Time: 01/27/2024 1:45 PM  Performed by: Claudene Delon SQUIBB, CRNAPre-anesthesia Checklist: Patient identified, Emergency Drugs available, Suction available, Patient being monitored and Timeout performed Patient Re-evaluated:Patient Re-evaluated prior to induction Oxygen Delivery Method: Simple face mask Placement Confirmation: positive ETCO2 Dental Injury: Teeth and Oropharynx as per pre-operative assessment

## 2024-01-27 NOTE — Anesthesia Postprocedure Evaluation (Signed)
 Anesthesia Post Note  Patient: Alexander Yang  Procedure(s) Performed: OPEN REDUCTION INTERNAL FIXATION, FRACTURE, OLECRANON (Right: Elbow)     Patient location during evaluation: PACU Anesthesia Type: Regional Level of consciousness: awake and alert Pain management: pain level controlled Vital Signs Assessment: post-procedure vital signs reviewed and stable Respiratory status: spontaneous breathing, nonlabored ventilation and respiratory function stable Cardiovascular status: blood pressure returned to baseline and stable Postop Assessment: no apparent nausea or vomiting Anesthetic complications: no   No notable events documented.  Last Vitals:  Vitals:   01/27/24 1600 01/27/24 1614  BP: 136/71 120/71  Pulse: (!) 55 (!) 32  Resp: 13 16  Temp:  (!) 36.2 C  SpO2: 96% 95%    Last Pain:  Vitals:   01/27/24 1614  TempSrc: Temporal  PainSc: 0-No pain                 Garnette FORBES Skillern

## 2024-01-27 NOTE — Discharge Instructions (Addendum)
 Discharge Instructions   You have a dressing with a plaster splint incorporated in it, as well as a sling for your arm.  Remain in the sling, but please move your digits and your shoulder thruout their entire ranges of motion to prevent stiffness of them.  Move your fingers as much as possible, making a full fist and fully opening the fist. Elevate your hand to reduce pain & swelling of the digits.  Ice over the operative site may be helpful to reduce pain & swelling.  DO NOT USE HEAT. Use your medicine as needed over the first 48 hours, and then you can begin to taper your use.   Leave the dressing in place until you return to our office.  You may shower, but keep the bandage clean & dry.  You may drive a car when you are off of prescription pain medications and can safely control your vehicle with both hands. Call the office tomorrow to make a follow-up appointment for 7-10 days postop   Please call [phone] during normal business hours or [phone] after hours for any problems. Including the following:  - excessive redness of the incisions - drainage for more than 4 days - fever of more than 101.5 F  *Please note that pain medications will not be refilled after hours or on weekends.     Post Anesthesia Home Care Instructions  Activity: Get plenty of rest for the remainder of the day. A responsible individual must stay with you for 24 hours following the procedure.  For the next 24 hours, DO NOT: -Drive a car -Advertising copywriter -Drink alcoholic beverages -Take any medication unless instructed by your physician -Make any legal decisions or sign important papers.  Meals: Start with liquid foods such as gelatin or soup. Progress to regular foods as tolerated. Avoid greasy, spicy, heavy foods. If nausea and/or vomiting occur, drink only clear liquids until the nausea and/or vomiting subsides. Call your physician if vomiting continues.  Special Instructions/Symptoms: Your  throat may feel dry or sore from the anesthesia or the breathing tube placed in your throat during surgery. If this causes discomfort, gargle with warm salt water. The discomfort should disappear within 24 hours.  If you had a scopolamine patch placed behind your ear for the management of post- operative nausea and/or vomiting:  1. The medication in the patch is effective for 72 hours, after which it should be removed.  Wrap patch in a tissue and discard in the trash. Wash hands thoroughly with soap and water. 2. You may remove the patch earlier than 72 hours if you experience unpleasant side effects which may include dry mouth, dizziness or visual disturbances. 3. Avoid touching the patch. Wash your hands with soap and water after contact with the patch.      Regional Anesthesia Blocks  1. You may not be able to move or feel the blocked extremity after a regional anesthetic block. This may last may last from 3-48 hours after placement, but it will go away. The length of time depends on the medication injected and your individual response to the medication. As the nerves start to wake up, you may experience tingling as the movement and feeling returns to your extremity. If the numbness and inability to move your extremity has not gone away after 48 hours, please call your surgeon.   2. The extremity that is blocked will need to be protected until the numbness is gone and the strength has returned. Because you cannot feel  it, you will need to take extra care to avoid injury. Because it may be weak, you may have difficulty moving it or using it. You may not know what position it is in without looking at it while the block is in effect.  3. For blocks in the legs and feet, returning to weight bearing and walking needs to be done carefully. You will need to wait until the numbness is entirely gone and the strength has returned. You should be able to move your leg and foot normally before you try and  bear weight or walk. You will need someone to be with you when you first try to ensure you do not fall and possibly risk injury.  4. Bruising and tenderness at the needle site are common side effects and will resolve in a few days.  5. Persistent numbness or new problems with movement should be communicated to the surgeon or the [facility] [id number] [facility] [phone]).    Next dose of tylenol may be taken at 6p  if needed

## 2024-01-27 NOTE — Progress Notes (Signed)
 Assisted Dr. Desmond Lope with right, supraclavicular, ultrasound guided block. Side rails up, monitors on throughout procedure. See vital signs in flow sheet. Tolerated Procedure well.

## 2024-01-27 NOTE — Anesthesia Preprocedure Evaluation (Signed)
 Anesthesia Evaluation  Patient identified by MRN, date of birth, ID band Patient awake    Reviewed: Allergy  & Precautions, NPO status , Patient's Chart, lab work & pertinent test results  History of Anesthesia Complications Negative for: history of anesthetic complications  Airway Mallampati: II  TM Distance: >3 FB Neck ROM: Full    Dental  (+) Teeth Intact, Dental Advisory Given   Pulmonary former smoker   Pulmonary exam normal breath sounds clear to auscultation       Cardiovascular hypertension, Pt. on medications (-) angina (-) Past MI Normal cardiovascular exam Rhythm:Regular Rate:Normal     Neuro/Psych  PSYCHIATRIC DISORDERS Anxiety Depression     Neuromuscular disease CVA, Residual Symptoms    GI/Hepatic negative GI ROS, Neg liver ROS,,,  Endo/Other  diabetes, Type 2, Oral Hypoglycemic Agents, Insulin  Dependent    Renal/GU negative Renal ROS     Musculoskeletal DISPLACED RIGHT ELBOW OLECRON FRACTURE   Abdominal   Peds  Hematology negative hematology ROS (+)   Anesthesia Other Findings Day of surgery medications reviewed with the patient.  Reproductive/Obstetrics                              Anesthesia Physical Anesthesia Plan  ASA: 3  Anesthesia Plan: Regional and MAC   Post-op Pain Management: Regional block* and Tylenol PO (pre-op)*   Induction: Intravenous  PONV Risk Score and Plan: 1 and TIVA, Treatment may vary due to age or medical condition, Dexamethasone and Ondansetron   Airway Management Planned: Natural Airway and Simple Face Mask  Additional Equipment:   Intra-op Plan:   Post-operative Plan:   Informed Consent: I have reviewed the patients History and Physical, chart, labs and discussed the procedure including the risks, benefits and alternatives for the proposed anesthesia with the patient or authorized representative who has indicated his/her understanding  and acceptance.     Dental advisory given  Plan Discussed with: CRNA  Anesthesia Plan Comments:         Anesthesia Quick Evaluation

## 2024-02-03 ENCOUNTER — Other Ambulatory Visit: Payer: Self-pay | Admitting: Family Medicine

## 2024-02-05 ENCOUNTER — Encounter (HOSPITAL_BASED_OUTPATIENT_CLINIC_OR_DEPARTMENT_OTHER): Payer: Self-pay | Admitting: Orthopedic Surgery

## 2024-02-12 ENCOUNTER — Encounter: Payer: Self-pay | Admitting: *Deleted

## 2024-02-17 ENCOUNTER — Telehealth: Payer: Self-pay

## 2024-02-17 ENCOUNTER — Other Ambulatory Visit (HOSPITAL_COMMUNITY): Payer: Self-pay

## 2024-02-17 ENCOUNTER — Encounter: Payer: Self-pay | Admitting: Oncology

## 2024-02-17 NOTE — Telephone Encounter (Signed)
 Pharmacy Patient Advocate Encounter   Received notification from Onbase that prior authorization for [facility] 3 plus sensors is due for renewal.   Insurance verification completed.   The patient is insured through [facility].  Action: PA required; PA submitted to above mentioned insurance via Latent Key/confirmation #/EOC [id number] Status is pending

## 2024-02-19 LAB — OPHTHALMOLOGY REPORT-SCANNED

## 2024-02-24 ENCOUNTER — Other Ambulatory Visit (HOSPITAL_COMMUNITY): Payer: Self-pay

## 2024-03-04 ENCOUNTER — Encounter: Payer: Self-pay | Admitting: Family Medicine

## 2024-03-25 ENCOUNTER — Other Ambulatory Visit: Payer: Self-pay | Admitting: Family Medicine

## 2024-04-14 ENCOUNTER — Ambulatory Visit: Admitting: Family Medicine

## 2024-04-14 ENCOUNTER — Encounter: Payer: Self-pay | Admitting: Oncology

## 2024-04-15 ENCOUNTER — Ambulatory Visit: Admitting: Family Medicine

## 2024-04-15 ENCOUNTER — Encounter: Payer: Self-pay | Admitting: Family Medicine

## 2024-04-15 ENCOUNTER — Other Ambulatory Visit: Payer: Self-pay | Admitting: Family Medicine

## 2024-04-15 VITALS — BP 116/60 | HR 74 | Temp 97.9°F | Ht 71.0 in | Wt 185.4 lb

## 2024-04-15 DIAGNOSIS — E1159 Type 2 diabetes mellitus with other circulatory complications: Secondary | ICD-10-CM

## 2024-04-15 DIAGNOSIS — E1149 Type 2 diabetes mellitus with other diabetic neurological complication: Secondary | ICD-10-CM | POA: Diagnosis not present

## 2024-04-15 DIAGNOSIS — I152 Hypertension secondary to endocrine disorders: Secondary | ICD-10-CM

## 2024-04-15 DIAGNOSIS — Z7984 Long term (current) use of oral hypoglycemic drugs: Secondary | ICD-10-CM | POA: Diagnosis not present

## 2024-04-15 DIAGNOSIS — Z794 Long term (current) use of insulin: Secondary | ICD-10-CM

## 2024-04-15 LAB — POCT GLYCOSYLATED HEMOGLOBIN (HGB A1C): Hemoglobin A1C: 7.6 % — AB (ref 4.0–5.6)

## 2024-04-15 NOTE — Progress Notes (Signed)
 "   Patient ID: Alexander Yang, male    DOB: December 28, 1940, 84 y.o.   MRN: [id number]  This visit was conducted in person.  BP 116/60   Pulse 74   Temp 97.9 F (36.6 C) (Oral)   Ht 5' 11 (1.803 m)   Wt 185 lb 6 oz (84.1 kg)   SpO2 99%   BMI 25.85 kg/m    CC:  Chief Complaint  Patient presents with   Medical Management of Chronic Issues    Pt here for DM check    Subjective:   HPI: Alexander Yang is a 84 y.o. male presenting on 04/15/2024 for Medical Management of Chronic Issues (Pt here for DM check)  He has started an online fitness class x 2 weeks  Diabetes:  Good control, on glipizide  XL and lantus  25 units .  low-dose fast acting insulin  Apidra  prior to his meal per Sliding scale ( most he uses is 4-6 units)  Farxiga  not tolerated given nausea.  Stopped metformin  due to possible GI irritation. SE to trulicity  in past, severe. Lab Results  Component Value Date   HGBA1C 7.6 (A) 04/15/2024  Using medications without difficulties: Hypoglycemic episodes: 1 % below.. 3 AM... rare occurrence. Hyperglycemic episodes:  occ Feet problems: no ulcers Blood Sugars averaging: On Freestyle Libre 62% of time in target, 37 % above goal. eye exam within last year:  Has  CGM  FBS 100-105  Hypertension:    Good control on amlodipine  5 mg daily losartan  100 mg daily. BP Readings from Last 3 Encounters:  04/15/24 116/60  01/27/24 120/71  01/10/24 (!) 140/82  Using medication without problems or lightheadedness:  none Chest pain with exertion:none Edema: none Short of breath: none Average home BPs: Other issues: Wt Readings from Last 3 Encounters:  04/15/24 185 lb 6 oz (84.1 kg)  01/27/24 192 lb 3.9 oz (87.2 kg)  01/10/24 194 lb 2 oz (88.1 kg)     Prostate cancer:  On Elegaurd ( had changed back from Firmagon, has been having dizziness so plans to return to Firmagon) and dalutomide. PSA almost undetectable  Meds contribute to fatigue.  Followed by [facility] and [facility].  On active  treatment.    Relevant past medical, surgical, family and social history reviewed and updated as indicated. Interim medical history since our last visit reviewed. Allergies and medications reviewed and updated. Outpatient Medications Prior to Visit  Medication Sig Dispense Refill   acetaminophen  (TYLENOL ) 325 MG tablet Take 2 tablets (650 mg total) by mouth every 6 (six) hours.     amLODipine  (NORVASC ) 5 MG tablet TAKE ONE TABLET BY MOUTH ONCE A DAY 90 tablet 3   atorvastatin  (LIPITOR) 40 MG tablet TAKE ONE TABLET BY MOUTH ONCE DAILY 90 tablet 3   buPROPion  (WELLBUTRIN  XL) 150 MG 24 hr tablet TAKE ONE TABLET (150 MG TOTAL) BY MOUTH DAILY. 90 tablet 0   CALCIUM -VITAMIN D  PO Take 2 tablets by mouth daily.     Continuous Glucose Receiver (FREESTYLE LIBRE 3 READER) DEVI USE TO CHECK BLOOD SUGAR CONTINUOUS 1 each 0   Continuous Glucose Sensor (FREESTYLE LIBRE 3 PLUS SENSOR) MISC Change sensor every 15 days. 2 each 11   cyanocobalamin  (VITAMIN B12) 1000 MCG tablet Take 1,000 mcg by mouth daily.     darolutamide (NUBEQA) 300 MG tablet Take 600 mg by mouth 2 (two) times daily with a meal.     empagliflozin (JARDIANCE) 25 MG TABS tablet Take by mouth daily.     glipiZIDE  (  GLUCOTROL  XL) 10 MG 24 hr tablet Take 1 tablet (10 mg total) by mouth daily with breakfast. 90 tablet 1   ibuprofen  (ADVIL ) 200 MG tablet Take 3 tablets (600 mg total) by mouth every 6 (six) hours.     imipramine  (TOFRANIL ) 10 MG tablet Take 4 tablets (40 mg total) by mouth at bedtime. 28 tablet 0   insulin  glargine (LANTUS  SOLOSTAR) 100 UNIT/ML Solostar Pen Inject 20 Units into the skin daily. 30 mL 3   insulin  glulisine (APIDRA ) 100 UNIT/ML Solostar Pen Inject 2 Units into the skin daily with lunch. 15 mL 3   Insulin  Pen Needle (PEN NEEDLES) 32G X 4 MM MISC 1 Device by Other route daily in the afternoon. Use to inject insulin  daily 100 each 3   ketoconazole  (NIZORAL ) 2 % cream Apply 1 application  topically daily as needed for  irritation.     leuprolide , 6 Month, (LEUPROLIDE  ACETATE, 6 MONTH,) 45 MG injection Inject 45 mg into the skin.     losartan  (COZAAR ) 100 MG tablet TAKE ONE TABLET BY MOUTH ONCE DAILY 90 tablet 3   OVER THE COUNTER MEDICATION Regaloid-Level teaspoon mix with water daily or every other day     oxyCODONE  (ROXICODONE ) 5 MG immediate release tablet Take 1 tablet (5 mg total) by mouth every 6 (six) hours as needed for severe pain (pain score 7-10). 12 tablet 0   degarelix (FIRMAGON) 80 MG injection Inject 80 mg into the skin every 28 (twenty-eight) days. (Patient not taking: Reported on 04/15/2024)     gabapentin (NEURONTIN) 300 MG capsule Take 300 mg by mouth at bedtime. (Patient not taking: Reported on 04/15/2024)     No facility-administered medications prior to visit.     Per HPI unless specifically indicated in ROS section below Review of Systems  Constitutional:  Negative for fatigue and fever.  HENT:  Negative for ear pain.   Eyes:  Negative for pain.  Respiratory:  Negative for cough and shortness of breath.   Cardiovascular:  Negative for chest pain, palpitations and leg swelling.  Gastrointestinal:  Positive for abdominal distention. Negative for abdominal pain.  Genitourinary:  Negative for dysuria.  Musculoskeletal:  Negative for arthralgias.  Neurological:  Negative for syncope, light-headedness and headaches.  Psychiatric/Behavioral:  Negative for dysphoric mood.    Objective:  BP 116/60   Pulse 74   Temp 97.9 F (36.6 C) (Oral)   Ht 5' 11 (1.803 m)   Wt 185 lb 6 oz (84.1 kg)   SpO2 99%   BMI 25.85 kg/m   Wt Readings from Last 3 Encounters:  04/15/24 185 lb 6 oz (84.1 kg)  01/27/24 192 lb 3.9 oz (87.2 kg)  01/10/24 194 lb 2 oz (88.1 kg)      Physical Exam Constitutional:      General: He is not in acute distress.    Appearance: Normal appearance. He is well-developed. He is not ill-appearing or toxic-appearing.  HENT:     Head: Normocephalic and atraumatic.      Right Ear: Hearing, tympanic membrane, ear canal and external ear normal.     Left Ear: Hearing, tympanic membrane, ear canal and external ear normal.     Nose: Nose normal.     Mouth/Throat:     Pharynx: Uvula midline.  Eyes:     General: Lids are normal. Lids are everted, no foreign bodies appreciated.     Conjunctiva/sclera: Conjunctivae normal.     Pupils: Pupils are equal, round, and reactive to  light.  Neck:     Thyroid : No thyroid  mass or thyromegaly.     Vascular: No carotid bruit.     Trachea: Trachea and phonation normal.  Cardiovascular:     Rate and Rhythm: Normal rate and regular rhythm.     Pulses: Normal pulses.     Heart sounds: S1 normal and S2 normal. No murmur heard.    No gallop.  Pulmonary:     Breath sounds: Normal breath sounds. No wheezing, rhonchi or rales.  Abdominal:     General: Bowel sounds are normal.     Palpations: Abdomen is soft.     Tenderness: There is no abdominal tenderness. There is no guarding or rebound.     Hernia: No hernia is present.  Musculoskeletal:     Cervical back: Normal range of motion and neck supple.  Lymphadenopathy:     Cervical: No cervical adenopathy.  Skin:    General: Skin is warm and dry.     Findings: No rash.  Neurological:     Mental Status: He is alert.     Cranial Nerves: No cranial nerve deficit.     Sensory: No sensory deficit.     Gait: Gait normal.     Deep Tendon Reflexes: Reflexes are normal and symmetric.  Psychiatric:        Speech: Speech normal.        Behavior: Behavior normal.        Judgment: Judgment normal.    Diabetic foot exam: Normal inspection No skin breakdown  Great to and heel calluses  Normal DP pulses Normal sensation to light touch and monofilament Nails normal     Results for orders placed or performed in visit on 04/15/24  POCT glycosylated hemoglobin (Hb A1C)   Collection Time: 04/15/24 12:37 PM  Result Value Ref Range   Hemoglobin A1C 7.6 (A) 4.0 - 5.6 %   HbA1c POC  (<> result, manual entry)     HbA1c, POC (prediabetic range)     HbA1c, POC (controlled diabetic range)      This visit occurred during the SARS-CoV-2 public health emergency.  Safety protocols were in place, including screening questions prior to the visit, additional usage of staff PPE, and extensive cleaning of exam room while observing appropriate contact time as indicated for disinfecting solutions.   COVID 19 screen:  No recent travel or known exposure to COVID19 The patient denies respiratory symptoms of COVID 19 at this time. The importance of social distancing was discussed today.   Assessment and Plan Problem List Items Addressed This Visit     Diabetes mellitus with neurological manifestations, controlled (HCC) - Primary (Chronic)   Chronic, inadequate control  but improving He is using glargine insulin  25 Units units in the morning as well as Glucotrol  XL max dose. low-dose fast acting insulin  Apidra  prior to his meal per Sliding scale.  We reviewed his continuous glucose monitor.  It appears daily he has spikes of blood sugar approximately 2 to 3 hours after breakfast up to 250 daily. We reviewed his breakfast choice and discussed modification. He can also take an Apidra  injection of 2 units right prior to his breakfast daily.  May need to increase this further. Encouraged high-protein high-fiber bedtime snack to avoid lows.  Reevaluate in 3 months.       Relevant Medications   empagliflozin (JARDIANCE) 25 MG TABS tablet   Other Relevant Orders   POCT glycosylated hemoglobin (Hb A1C) (Completed)   Hypertension associated  with diabetes (HCC) (Chronic)   Stable, chronic.  Continue current medication.   amlodipine  5 mg daily losartan  100 mg daily.      Relevant Medications   empagliflozin (JARDIANCE) 25 MG TABS tablet     Greig Ring, MD   "

## 2024-04-15 NOTE — Assessment & Plan Note (Signed)
 Chronic, inadequate control  but improving He is using glargine insulin  25 Units units in the morning as well as Glucotrol  XL max dose. low-dose fast acting insulin  Apidra  prior to his meal per Sliding scale.  We reviewed his continuous glucose monitor.  It appears daily he has spikes of blood sugar approximately 2 to 3 hours after breakfast up to 250 daily. We reviewed his breakfast choice and discussed modification. He can also take an Apidra  injection of 2 units right prior to his breakfast daily.  May need to increase this further. Encouraged high-protein high-fiber bedtime snack to avoid lows.  Reevaluate in 3 months.

## 2024-04-15 NOTE — Assessment & Plan Note (Signed)
Stable, chronic.  Continue current medication.   amlodipine 5 mg daily losartan 100 mg daily.

## 2024-06-05 ENCOUNTER — Ambulatory Visit

## 2024-07-03 ENCOUNTER — Other Ambulatory Visit

## 2024-07-10 ENCOUNTER — Encounter: Admitting: Family Medicine
